# Patient Record
Sex: Male | Born: 1960 | Race: Black or African American | Hispanic: No | Smoking: Current every day smoker
Health system: Southern US, Community
[De-identification: ages and names within clinical notes are randomized; demographics above are authoritative.]

## PROBLEM LIST (undated history)

## (undated) DIAGNOSIS — N182 Chronic kidney disease, stage 2 (mild): Secondary | ICD-10-CM

## (undated) DIAGNOSIS — Z720 Tobacco use: Secondary | ICD-10-CM

## (undated) DIAGNOSIS — I2510 Atherosclerotic heart disease of native coronary artery without angina pectoris: Secondary | ICD-10-CM

## (undated) DIAGNOSIS — I739 Peripheral vascular disease, unspecified: Secondary | ICD-10-CM

## (undated) DIAGNOSIS — I639 Cerebral infarction, unspecified: Secondary | ICD-10-CM

## (undated) DIAGNOSIS — M722 Plantar fascial fibromatosis: Secondary | ICD-10-CM

## (undated) DIAGNOSIS — Z9289 Personal history of other medical treatment: Secondary | ICD-10-CM

## (undated) DIAGNOSIS — I10 Essential (primary) hypertension: Secondary | ICD-10-CM

## (undated) DIAGNOSIS — Z809 Family history of malignant neoplasm, unspecified: Secondary | ICD-10-CM

## (undated) DIAGNOSIS — C169 Malignant neoplasm of stomach, unspecified: Secondary | ICD-10-CM

## (undated) DIAGNOSIS — N289 Disorder of kidney and ureter, unspecified: Secondary | ICD-10-CM

## (undated) DIAGNOSIS — E785 Hyperlipidemia, unspecified: Secondary | ICD-10-CM

## (undated) HISTORY — DX: Chronic kidney disease, stage 2 (mild): N18.2

## (undated) HISTORY — PX: CARDIAC CATHETERIZATION: SHX172

## (undated) HISTORY — DX: Hyperlipidemia, unspecified: E78.5

## (undated) HISTORY — PX: FEMORAL ARTERY - POPLITEAL ARTERY BYPASS GRAFT: SUR180

## (undated) HISTORY — PX: PR VEIN BYPASS GRAFT,AORTO-FEM-POP: 35551

## (undated) HISTORY — DX: Atherosclerotic heart disease of native coronary artery without angina pectoris: I25.10

## (undated) HISTORY — DX: Tobacco use: Z72.0

## (undated) HISTORY — DX: Peripheral vascular disease, unspecified: I73.9

## (undated) HISTORY — DX: Family history of malignant neoplasm, unspecified: Z80.9

## (undated) HISTORY — DX: Malignant neoplasm of stomach, unspecified: C16.9

## (undated) HISTORY — DX: Cerebral infarction, unspecified: I63.9

## (undated) HISTORY — PX: OTHER SURGICAL HISTORY: SHX169

## (undated) HISTORY — PX: COLONOSCOPY: SHX174

---

## 2007-02-22 ENCOUNTER — Inpatient Hospital Stay (HOSPITAL_COMMUNITY): Admission: EM | Admit: 2007-02-22 | Discharge: 2007-02-26 | Payer: Self-pay | Admitting: Emergency Medicine

## 2007-02-27 ENCOUNTER — Ambulatory Visit (HOSPITAL_COMMUNITY): Admission: RE | Admit: 2007-02-27 | Discharge: 2007-02-27 | Payer: Self-pay | Admitting: Internal Medicine

## 2007-02-27 ENCOUNTER — Ambulatory Visit: Payer: Self-pay | Admitting: Cardiology

## 2007-07-17 ENCOUNTER — Emergency Department (HOSPITAL_COMMUNITY): Admission: EM | Admit: 2007-07-17 | Discharge: 2007-07-17 | Payer: Self-pay | Admitting: Emergency Medicine

## 2007-08-15 ENCOUNTER — Emergency Department (HOSPITAL_COMMUNITY): Admission: EM | Admit: 2007-08-15 | Discharge: 2007-08-15 | Payer: Self-pay | Admitting: Emergency Medicine

## 2007-09-26 ENCOUNTER — Emergency Department (HOSPITAL_COMMUNITY): Admission: EM | Admit: 2007-09-26 | Discharge: 2007-09-26 | Payer: Self-pay | Admitting: Emergency Medicine

## 2011-01-06 ENCOUNTER — Other Ambulatory Visit (HOSPITAL_COMMUNITY): Payer: Self-pay | Admitting: Urology

## 2011-01-11 ENCOUNTER — Encounter (HOSPITAL_COMMUNITY): Payer: Self-pay

## 2011-01-11 ENCOUNTER — Ambulatory Visit (HOSPITAL_COMMUNITY)
Admission: RE | Admit: 2011-01-11 | Discharge: 2011-01-11 | Disposition: A | Discharge status: Home / Self Care | Payer: PRIVATE HEALTH INSURANCE | Source: Ambulatory Visit | Attending: Urology | Admitting: Urology

## 2011-01-11 DIAGNOSIS — R319 Hematuria, unspecified: Secondary | ICD-10-CM | POA: Insufficient documentation

## 2011-01-11 DIAGNOSIS — R109 Unspecified abdominal pain: Secondary | ICD-10-CM | POA: Insufficient documentation

## 2011-01-11 DIAGNOSIS — I77811 Abdominal aortic ectasia: Secondary | ICD-10-CM | POA: Insufficient documentation

## 2011-01-11 HISTORY — DX: Essential (primary) hypertension: I10

## 2011-01-11 MED ORDER — IOHEXOL 300 MG/ML  SOLN
125.0000 mL | Freq: Once | INTRAMUSCULAR | Status: AC | PRN
  Administered 2011-01-11: 125 mL via INTRAVENOUS

## 2011-02-09 NOTE — Group Therapy Note (Signed)
Todd [name], Todd [name]                  ACCOUNT NO.:  [id number]   MEDICAL RECORD NO.:  [id number]          PATIENT TYPE:  INP   LOCATION:  A203                          FACILITY:  [facility]   PHYSICIAN:  Kofi A. Gerilyn Pilgrim, M.D. DATE OF BIRTH:  1961-04-29   DATE OF PROCEDURE:  DATE OF DISCHARGE:                                 PROGRESS NOTE   The family was very concerned today because the patient has had some  confusion, and Dr. Benson Setting wanted me to come by and examine the patient  again.  It appears that they were concerned that the patient may have a  neglect, but on further questioning, it appears that he seemed to be  having some right-to-left disorientation.  They also report that he has  very low spontaneous speech, which was observed yesterday.   PHYSICAL EXAMINATION:  GENERAL:  Examination today shows that he is  awake.  VITAL SIGNS:  Temperature 98.2, pulse 65, respiratory rate 20, blood  pressure 131/85.  NEUROLOGIC:  He is awake.  Again, he has excellent comprehension and  follows commands briskly, but he has positive spontaneous speech and  poor fluency.  The patient has full visual fields that are intact  without any neglect noted involving the visual stimulation.  No neglect  noted in the upper extremities.  He is able to name fingers, and there  is no evidence of finger agnosia.  He does, however, seem to have  difficulty with left-right disoriented relating to his feet,  consistently calling his right leg the left leg.  Additional impairment  seems to be evident with the patient having difficulty calculating,  especially using additions.  He actually did well with multiplication  and simple subtraction but consistently made errors with addition,  seemingly confusing it with multiplication.  There is some impairment in  drawing on the right, indicating some evidence of agraphia.  He  continues to have good strength of all of the upper and lower  extremities.   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  It appears that his stroke has probably  affected more of the posterior fibers than suspected.  We reassured this  family that the symptoms are due to his stroke and he should get better  with time, how much improvement is not known.  They have concerns about  the patient because he tends to wander off.  In fact, he wandered off  from the floor today, went to the store and bought cigarettes, and  returned to the floor.  He will need close supervision at home until he  recovers.      Kofi A. Gerilyn Pilgrim, M.D.  Electronically Signed     KAD/MEDQ  D:  02/24/2007  T:  02/24/2007  Job:  [id number]

## 2011-02-09 NOTE — Procedures (Signed)
[name], Todd [name]                  ACCOUNT NO.:  [id number]   MEDICAL RECORD NO.:  [id number]          PATIENT TYPE:  OUT   LOCATION:  RAD                           FACILITY:  [facility]   PHYSICIAN:  Gerrit Friends. Dietrich Pates, MD, FACCDATE OF BIRTH:  April 06, 1961   DATE OF PROCEDURE:  02/27/2007  DATE OF DISCHARGE:                                ECHOCARDIOGRAM   REFERRING:  Osvaldo Shipper, M.D.   CLINICAL DATA:  A 50 year old gentleman with CVA and hypertension.  Aorta 3.7, left atrium 4.3, septum 2.0, posterior wall 1.9, LV diastole  3.9, LV systole 2.7.   1. Technically suboptimal but adequate echocardiographic study.  2. Normal left atrium, right atrium and right ventricle.  3. Normal tricuspid valve; physiologic regurgitation.  4. Minimal sclerosis of a trileaflet aortic valve.  5. Normal diameter of the proximal ascending aorta; mild calcification      of the wall and annulus.  6. Mild mitral valve thickening with trivial regurgitation.  7. Normal pulmonic valve and proximal pulmonary artery.  8. Normal left ventricular size; moderate hypertrophy; normal regional      and global function.  9. Normal IVC.  10.No pericardial effusion.      Gerrit Friends. Dietrich Pates, MD, [facility]  Electronically Signed     RMR/MEDQ  D:  02/28/2007  T:  02/28/2007  Job:  [id number]

## 2011-02-09 NOTE — H&P (Signed)
Todd [name], SINGLETON                  ACCOUNT NO.:  [id number]   MEDICAL RECORD NO.:  [id number]          PATIENT TYPE:  INP   LOCATION:  A203                          FACILITY:  [facility]   PHYSICIAN:  Osvaldo Shipper, MD     DATE OF BIRTH:  1961-03-17   DATE OF ADMISSION:  02/22/2007  DATE OF DISCHARGE:  LH                              HISTORY & PHYSICAL   PRIMARY CARE PHYSICIAN:  The patient does not have a primary care  physician.   ADMISSION DIAGNOSES:  1. 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  2. Nonspecific white matter lesions of unclear etiology.  3. Untreated hypertension.  4. Illicit drug use.  5. Tobacco abuse.   CHIEF COMPLAINT:  Altered mental status and right-sided weakness for  about 2 weeks.   HISTORY OF PRESENT ILLNESS:  The patient is a 50 year old African-  American male who has a history of hypertension, however, he has not  been taking his medications for at least 2-3 weeks as far as we know.  The patient is having altered mental status, hence is not a good  historian.  His parents are with him in the room and they provide part  of the history.  Apparently about 2-3 weeks the patient started noticing  weakness on the right side.  He has been having some frequent falls.  He  has been having some numbness.  He has also had urinary incontinence  associated with this weakness.  The patient also reports hematuria.  He  is having some slurred speech.  He says that he has also been forgetting  words and has been having some memory issues as well.  This is the first  time these symptoms have been present.  He has never had a stroke in the  past.  The patient also complains of pain in the right side of his  abdomen along with hematuria.  The hematuria itself is painless.  The  patient reports a weight loss of 10 pounds over the past 2-3 weeks.  Because of the patient being a poor historian, unfortunately, other  history is not elicited at this time.  But  it appears that his main  issue is the memory impairment, right-sided weakness, and hematuria  along with the abdominal pain.   MEDICATIONS:  He is on unknown antihypertensive agents which he has not  been taking for a while.  He was also started on Cipro by [facility]  physicians for an apparent UTI.   ALLERGIES:  No known drug allergies.   PAST MEDICAL HISTORY:  Positive for hypertension, however, which is not  being treated as mentioned above.  He is noncompliant.  He denies any  other medical problems.  No surgeries in the past.  He also has  peripheral vascular disease.   SOCIAL HISTORY:  He lives in [place] with his parents.  He does not work.  He smokes two packs of cigarettes everyday.  He has a 40-pack-year  history of smoking, no alcohol use.  He smokes marijuana on a daily  basis for almost 10 years  now.  He denies any cocaine use or heroin use.   FAMILY HISTORY:  Positive for leukemia, CML in his mother, hypertension,  dyslipidemia, diabetes, coronary artery disease.  No history of strokes  or lupus in the family.   REVIEW OF SYSTEMS:  Unable to do in this gentleman with memory problems  and altered mental status.   PHYSICAL EXAMINATION:  VITAL SIGNS:  Temperature 97.6, blood pressure  143/88, heart rate is in the 80s and is regular, respiratory rate is 18,  saturation is 97% on room air.  GENERAL:  A well-developed, well-nourished, individual in no acute  distress.  HEENT:  There is no pallor and no icterus.  Oral mucous membrane is  moist.  No oral lesions are noted.  NECK:  Soft and supple.  No thyromegaly is appreciated.  Trachea is  posterior midline.  LUNGS:  Clear to auscultation bilaterally.  No dullness to percussion.  No wheezes, rales, or rhonchi.  CARDIOVASCULAR:  S1 and S2 is normal, regular.  No murmurs appreciated.  No S3 or S4.  No carotid bruits are heard.  ABDOMEN:  Soft, nontender, and nondistended.  Bowel sounds are present.  No mass or  organomegaly is present.  He does point to his right lower  quadrant, but there is no palpable tenderness.  GENITOURINARY:  His genitalia look normal.  MUSCULOSKELETAL:  No abnormality present.  Peripheral pulses palpable,  no edema is present.  NEUROLOGY:  The patient is alert and oriented to time, place, person,  but takes a while to answer questions.  Cranial nerves reveal normal  examination.  Sensory examination to soft touch was equal bilaterally.  Motor examination; slight deficits in the ankle extension on the right  side.  However, the rest of the groups were equal on both sides, 5/5.  Cerebellar signs; again a little deficit on the right lower extremity.  The upper extremities were equal.  Finger-to-nose was normal.  NO  pronator drift was noticed.  Gait was assessed in a limited fashion  which was normal.   LABORATORY DATA:  CBC showed a white count of 11.7, hemoglobin 14.3,  platelet count 284.  Differential is normal.  BMET showed a sodium of  134, otherwise unremarkable.  LFT's also are unremarkable.  He has not  had a UA here.  No EKG done here.  He did have a UA at the [facility]  facility which showed cloudy urine, brown color, more than 1.03 specific  gravity, 2+ blood, 1+ bilirubin, 1+ protein, negative leukocytes,  negative nitrites.   He did have imaging study in the form of a CAT scan with and without  contrast which showed ill-defined regions of low attenuation involving  the medial cortex and subcortical white matter of the left frontal lobe  with questionable enhancement, nonhemorrhagic infarct versus  infiltrative process was questioned.   He had an MRI here which showed a left ACA distribution infarct and  other white matter lesions were noted of questionable etiology, possible  vasculitis was questioned.   ASSESSMENT:  This is a 50 year old African-American male with history of hypertension who presents with 2-3 week history of right-sided weakness,   memory impairment, speech difficulties, and hematuria.  He has had a  stroke based on the MRI done today which would account for some of his  symptomatology.  His white matter lesions are of concern, however, which  could be secondary to his hypertension and also could be secondary to  his polysubstance abuse.  Autoimmune  vasculitic processes also need to  be considered.  Hematuria is also concerning in this gentleman who seems  to be having painless hematuria.  I think he needs further imaging  studies and a urology consultation.   PLAN:  1. Neurology.  We will admit the patient to telemetry, get EKG,      Doppler study, echo, and ESR, B12, RPR, TSH.  Consult Kofi A.      Gerilyn Pilgrim, M.D. to see this individual. Start him on aspirin for      now.  Blood pressure control will be crucial in this patient.  PT,      OT, and swallowing evaluation will be done as well.  EEG will also      be obtained.  2. Hematuria, painless.  We will check UA again here.  We will consult      Dennie Maizes, M.D. to evaluate this patient.  We will obtain CAT      scan of his abdomen and pelvis with and without contrast to      evaluate the hematuria to look at his genitourinary morphology as      well as to evaluate his right-sided lower abdominal pain.  3. Right-sided abdominal pain.  It is nontender, so I am not quite      sure what this is.  I could be related to the hematuria and as      discussed above, we will get a CAT scan.  4. Hypertension.  We will put him on metoprolol and then further      control his blood pressure aggressively.  5. DVT and GI prophylaxis will be initiated.   The above was discussed with the patient and his parents.  I told him  that the patient will likely need hospitalization for 1-2 days to  complete his workup and then he will need continued outpatient  evaluation and follow-up.   Further management decisions will be based on results of initial testing  and the patient's  response to treatment.      Osvaldo Shipper, MD  Electronically Signed     GK/MEDQ  D:  02/22/2007  T:  02/22/2007  Job:  [id number]   cc:   Darleen Crocker A. Gerilyn Pilgrim, M.D.  Fax: [phone]

## 2011-02-09 NOTE — Discharge Summary (Signed)
NAMEHIYAN, Todd [name]                  ACCOUNT NO.:  [id number]   MEDICAL RECORD NO.:  [id number]          PATIENT TYPE:  INP   LOCATION:  A203                          FACILITY:  [facility]   PHYSICIAN:  Skeet Latch, DO    DATE OF BIRTH:  1961-07-25   DATE OF ADMISSION:  02/22/2007  DATE OF DISCHARGE:  06/01/2008LH                               DISCHARGE SUMMARY   DISCHARGE DIAGNOSES:  1. Subacute infarct left anterior cerebral artery.  2. Dyslipidemia.  3. Hypertension.  4. Painless hematuria.   BRIEF HOSPITAL COURSE:  Mr. Morie is a 50 year old African-American male,  with history of hypertension that has not been treated, who presented  with altered mental status.  Apparently, approximately 2-3 weeks prior  the patient started noticing weakness on his right side and was having  frequent falls.  He was having also some numbness and some urinary  incontinence associated with this weakness.  The patient also stated he  was having some painless hematuria at the time and was having problems  with his memory.  The patient has no history of any strokes in the past  or any major medical problems.  Of note, patient also reported a weight  loss of approximately 10 pounds over the last few weeks.  On admission  the patient was a poor historian due to his memory impairment.  Patient  was admitted for right-sided weakness, hematuria, some memory  impairment.  The patient was admitted to the telemetry unit.  Doppler  studies performed showed some moderate disease.  Neurology was  consulted.   The patient had an MR of his brain done which showed:  1. Acute early subacute left ACA infarct with areas of involvement.  2. Showed premature atrophy and small vessel disease with potentially      vasculitis.  3. Showed old inferior blowout fracture.   His carotid Doppler showed mild bilateral carotid bifurcation plaque  without hemodynamically significant stenosis.  Patient complained of  some  abdominal pain, had a pelvic CT done that showed no renal calculi  or masses, showed a low density lesion right lobe of liver too small to  adequately characterize.  Neck showed atherosclerotic type changes in  the aorta with a 2.7 cm focal bulge lower abdominal aorta.  Pelvis CT  showed some enlargement of its prostate.  I had a limited evaluation of  his bladder without discrete mass.  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  Neurology, as stated, was  consulted due to patient's right-sided weakness and his subacute  infarct.  The patient was placed on aspirin, also a lipid-lowering  agent, and his blood pressure was controlled.  His EEG did show some  mild slowing per Neurology.  At this time the patient is stable for  discharge.   VITALS ON DISCHARGE:  Temperature is 97, pulse 66, respirations 18,  blood pressure is 150/102, O2 sat 98% on room air.   LABS:  White blood cell count 11.3, hemoglobin 13.5, hematocrit 38.3,  platelets 251.  Sodium 137, potassium 4.3, chloride 110,  CO2 24, glucose  88, BUN 15, creatinine 1.16.   CONDITION AT DISCHARGE:  Stable.  Patient will be discharged to home.   DIET:  Patient will have a low sodium, heart healthy diet.   ACTIVITY:  Slowly increase activity as directed.   FOLLOWUP:  Patient is to follow up with Neurology in approximately 2  weeks.  Also, we will give patient the name of a primary care physician  and he needs to follow up within the next 7-10 days also.   MEDICATIONS ON DISCHARGE:  1. Aspirin 81 mg daily.  2. Lipitor 40 mg one tab p.o. q.h.s.  3. Lopressor 50 mg p.o. b.i.d.   The patient was scheduled for an echo on February 27, 2007, and will give  patient information regarding keeping this appointment at this time.      Skeet Latch, DO  Electronically Signed     SM/MEDQ  D:  02/26/2007  T:  02/26/2007  Job:  [id number]   cc:   Darleen Crocker A. Gerilyn Pilgrim, M.D.  Fax: [phone]

## 2011-02-09 NOTE — Consult Note (Signed)
NAMEKWENTIN, SINAGRA                  ACCOUNT NO.:  [id number]   MEDICAL RECORD NO.:  [id number]          PATIENT TYPE:  INP   LOCATION:  A203                          FACILITY:  [facility]   PHYSICIAN:  Kofi A. Gerilyn Pilgrim, M.D. DATE OF BIRTH:  1960/11/26   DATE OF CONSULTATION:  DATE OF DISCHARGE:                                 CONSULTATION   HISTORY OF PRESENT ILLNESS:  This is a 50 year old man who presents with  a 2-week history of altered mental status.  It appears that the patient  may have had acute right-sided weakness 2 weeks ago which resulted in  the patient falling to the ground.  It appears that his symptoms  recovered, but he did not seek medical attention or inform his family  about this.  He apparently continued to have some spells of confusion  which got a lot worse yesterday which resulted in the patient being  taken to [facility] in the ambulance subsequently down here.  The  patient apparently has had some problems being child like and difficulty  with memory and focusing.  He apparently has a one-year history or so of  hypertension but recently has not been taking his medications.  He also  has had problems with urinary incontinence and hematuria which has been  a recent problem.  This is currently being worked up by a urologist  which has been consulted.   PAST MEDICAL HISTORY:  Hypertension, noncompliant with medical care.  No  other medical problems.   CURRENT MEDICATIONS:  1. Lovenox.  2. Metoprolol.  3. Nicotine patch.  4. Aspirin 325 mg.   SOCIAL HISTORY:  He stays with his parents and also his girlfriend.  He  smokes 2 packs of cigarettes per day and has done so for about 4 years.  No alcohol use.  He does smoke marijuana but uses no cocaine or illicit  drugs.   FAMILY HISTORY:  Significant for coronary artery disease, dyslipidemia,  leukemia, hypertension.   PHYSICAL EXAMINATION:  GENERAL:  A thin pleasant gentleman in no acute  distress.  VITAL SIGNS:   Temperature 97.5, pulse 64, respirations 20, blood  pressure 146/98.  HEENT:  Head is normocephalic and atraumatic.  Poor dentition.  NECK:  Supple.  ABDOMEN:  Soft.  EXTREMITIES:  No significant varicosities or edema.  MENTAL STATUS:  He does seem to have a mild bradyphrenia, but his naming  actually looks very good as he is able to name all objects tested.  Repetition was also relatively preserved as was comprehension.  He does  seem to have some element of reduced fluency, however.  No dysarthria is  noted.  CRANIAL NERVES:  Pupils are 4 mm and briskly reactive.  Extraocular  movements are intact.  Facial muscle strength is symmetric.  Tongue is  midline.  Uvula is midline.  Shoulder shrug is normal.  MOTOR:  Normal tone, bulk and strength.  I see no pronator drift.  REFLEXES:  Slightly brisk.  Plantar reflexes are both downgoing.  SENSORY:  Normal to light touch.  GAIT:  Normal.  COORDINATION:  No dysmetria, tremors or parkinsonism.   IMAGING:  MRI scan is reviewed and shows infarcts involving the left  anterior cerebral artery distribution affecting the corpus callosum,  singlet gyrus and adjacent white matter and cortical areas.  There are  couple of subcortical hyperintensities noted on fair imaging.  Carotid  Dopplers look fine.   LABORATORIES:  Vitamin B-12 421.  Lipid profile with total cholesterol  217, HDL 22, LDL 161, triglycerides 169.  Sed rate 20.  RPR nonreactive.  Urine drug screen negative.  Urinalysis revealed large blood.  Hepatic  function normal.  Basic metabolic profile with sodium 134, potassium  3.7, chloride 103, glucose 89, BUN 19, creatinine 1.2.  WBC 11,  hemoglobin 14, platelet count 284.   ASSESSMENT:  1. Subacute infarct involving the left anterior cerebral artery with      confusion, difficulty focusing and subtle language impairment.  2. Significant dyslipidemia.  3. Hypertension.   RECOMMENDATIONS:  1. Agree with the current workup and  treatment, including aspirin.  He      should be on a lipid-lowering agent, preferably a statin.  2. Blood pressure control and close followup with a PCP.  3. He also had an EEG ordered which I think is appropriate given the      persistent confusion.   Thank you for this consultation.      Kofi A. Gerilyn Pilgrim, M.D.  Electronically Signed     KAD/MEDQ  D:  02/23/2007  T:  02/23/2007  Job:  [id number]   cc:   Katheren Shams. Melvyn Neth, MD  Fax: [phone]

## 2011-02-09 NOTE — Procedures (Signed)
NAMEAUSTEN, Todd [name]                  ACCOUNT NO.:  [id number]   MEDICAL RECORD NO.:  [id number]          PATIENT TYPE:  INP   LOCATION:  A203                          FACILITY:  [facility]   PHYSICIAN:  Kofi A. Gerilyn Pilgrim, M.D. DATE OF BIRTH:  1961/05/19   DATE OF PROCEDURE:  02/25/2007  DATE OF DISCHARGE:  02/26/2007                              EEG INTERPRETATION   EEG REPORT:  The patient is a 50 year old man who presents with altered  mental status confusion with a possibility of complex partial seizures  and hence the EEG.   MEDICATIONS:  1. Aspirin.  2. Lovenox.  3. Lopressor.  4. Nicotine.  5. Cipro.  6. Lipitor.  7. Phenergan.  8. Zofran.  9. Ventolin.  10.Tylenol.   ANALYSIS:  A 16-channel recording was conducted for 21 minutes.  There  is a posterior rhythm of 12-13 hertz with low electrocortical voltage  with background activity attenuating to eye opening.  Photic stimulation  and hypoventilation are conducted without significant changes in the  background activity.  Left frontal slowing in the F3-F7 region is noted.  There is no epileptiform activity or lateralized slowing noted.   IMPRESSION:  Abnormal recording due to left frontal slowing which can be  seen in focal cerebral disturbances.  However, there is no evidence of  epileptiform activity.      Kofi A. Gerilyn Pilgrim, M.D.  Electronically Signed     KAD/MEDQ  D:  02/28/2007  T:  02/28/2007  Job:  [id number]

## 2011-07-07 LAB — BASIC METABOLIC PANEL
BUN: 31 — ABNORMAL HIGH
Chloride: 102
GFR calc non Af Amer: 41 — ABNORMAL LOW

## 2011-07-07 LAB — CBC
MCHC: 34.9
MCV: 85.8
RBC: 4.47
WBC: 11.4 — ABNORMAL HIGH

## 2011-07-07 LAB — DIFFERENTIAL
Basophils Absolute: 0.1
Basophils Relative: 1
Eosinophils Relative: 1
Monocytes Absolute: 0.8 — ABNORMAL HIGH
Neutro Abs: 6

## 2011-07-15 LAB — BASIC METABOLIC PANEL
Chloride: 110
GFR calc Af Amer: >60
GFR calc non Af Amer: >60
Glucose, Bld: 88
Potassium: 4.3

## 2011-07-15 LAB — URINALYSIS, ROUTINE W REFLEX MICROSCOPIC
Ketones, ur: NEGATIVE
Leukocytes, UA: NEGATIVE
Nitrite: NEGATIVE
Protein, ur: NEGATIVE

## 2011-07-15 LAB — DIFFERENTIAL
Monocytes Relative: 6
Neutro Abs: 6.2
Neutrophils Relative %: 55

## 2011-07-15 LAB — CBC
MCHC: 35.3
MCV: 84.6
RDW: 14

## 2012-04-11 ENCOUNTER — Encounter: Payer: Self-pay | Admitting: Vascular Surgery

## 2012-04-14 ENCOUNTER — Emergency Department (HOSPITAL_COMMUNITY)
Admission: EM | Admit: 2012-04-14 | Discharge: 2012-04-14 | Disposition: A | Discharge status: Home / Self Care | Payer: PRIVATE HEALTH INSURANCE | Attending: Emergency Medicine | Admitting: Emergency Medicine

## 2012-04-14 ENCOUNTER — Emergency Department (HOSPITAL_COMMUNITY): Payer: PRIVATE HEALTH INSURANCE

## 2012-04-14 ENCOUNTER — Encounter (HOSPITAL_COMMUNITY): Payer: Self-pay | Admitting: *Deleted

## 2012-04-14 DIAGNOSIS — Z8673 Personal history of transient ischemic attack (TIA), and cerebral infarction without residual deficits: Secondary | ICD-10-CM | POA: Insufficient documentation

## 2012-04-14 DIAGNOSIS — M7700 Medial epicondylitis, unspecified elbow: Secondary | ICD-10-CM | POA: Insufficient documentation

## 2012-04-14 DIAGNOSIS — E785 Hyperlipidemia, unspecified: Secondary | ICD-10-CM | POA: Insufficient documentation

## 2012-04-14 DIAGNOSIS — F17200 Nicotine dependence, unspecified, uncomplicated: Secondary | ICD-10-CM | POA: Insufficient documentation

## 2012-04-14 DIAGNOSIS — I129 Hypertensive chronic kidney disease with stage 1 through stage 4 chronic kidney disease, or unspecified chronic kidney disease: Secondary | ICD-10-CM | POA: Insufficient documentation

## 2012-04-14 DIAGNOSIS — N189 Chronic kidney disease, unspecified: Secondary | ICD-10-CM | POA: Insufficient documentation

## 2012-04-14 MED ORDER — NAPROXEN 500 MG PO TABS: 500.0000 mg | ORAL_TABLET | Freq: Two times a day (BID) | ORAL | Status: DC

## 2012-04-14 NOTE — ED Notes (Signed)
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

## 2012-04-14 NOTE — ED Provider Notes (Signed)
History     CSN: [id number]  Arrival date & time 04/14/12  1323   First MD Initiated Contact with Patient 04/14/12 1402      Chief Complaint  Patient presents with  . Arm Pain    (Consider location/radiation/quality/duration/timing/severity/associated sxs/prior treatment) HPI Comments: Todd [name] presents with a one-month history of pain in his left medial elbow with numbness of his left forearm in the ulnar distribution which is also been present for the past month.  He denies weakness in his left arm or hand but has constant pain in he notes that he is unable to completely extend his elbow without significant pain.  He denies any injuries or overuse syndromes.  He is right-handed.  Pain is achy and constant.  He has taken Tylenol without relief of symptoms.  The history is provided by the patient.    Past Medical History  Diagnosis Date  . Hypertension   . Hyperlipidemia   . Peripheral vascular disease   . Stroke     2008  . Chronic kidney disease     moderate    Past Surgical History  Procedure Date  . Stents in bil legs     Family History  Problem Relation Age of Onset  . Diabetes Mother   . Heart disease Mother   . Stroke Mother     x 2  . Cancer Father     colon  . Arthritis Father     History  Substance Use Topics  . Smoking status: Current Everyday Smoker    Types: Cigarettes    Last Attempt to Quit: 04/11/2010  . Smokeless tobacco: Not on file  . Alcohol Use: No      Review of Systems  Musculoskeletal: Positive for arthralgias. Negative for joint swelling.  Skin: Negative for wound.  Neurological: Positive for numbness. Negative for weakness.    Allergies  Review of patient's allergies indicates no known allergies.  Home Medications   Current Outpatient Rx  Name Route Sig Dispense Refill  . ASPIRIN 81 MG PO TABS Oral Take 81 mg by mouth daily.    . CHLORTHALIDONE 25 MG PO TABS Oral Take 25 mg by mouth daily.    Marland Kitchen ESZOPICLONE 3 MG  PO TABS Oral Take 3 mg by mouth at bedtime. Take immediately before bedtime    . METOPROLOL TARTRATE 50 MG PO TABS Oral Take 50 mg by mouth 2 (two) times daily.    Marland Kitchen ROSUVASTATIN CALCIUM 10 MG PO TABS Oral Take 10 mg by mouth daily.    Marland Kitchen SILDENAFIL CITRATE 50 MG PO TABS Oral Take 50 mg by mouth daily as needed.    . TRIAMTERENE-HCTZ 37.5-25 MG PO CAPS Oral Take 1 capsule by mouth daily as needed. Only when pressure is high.    Marland Kitchen NAPROXEN 500 MG PO TABS Oral Take 1 tablet (500 mg total) by mouth 2 (two) times daily. 30 tablet 0    BP 104/69  Pulse 70  Temp 97.9 F (36.6 C) (Oral)  Resp 20  Ht 6\' 1"  (1.854 m)  Wt 201 lb (91.173 kg)  BMI 26.52 kg/m2  SpO2 99%  Physical Exam  Constitutional: He appears well-developed and well-nourished.  HENT:  Head: Atraumatic.  Neck: Normal range of motion.  Cardiovascular:       Pulses equal bilaterally  Musculoskeletal: He exhibits tenderness. He exhibits no edema.       Left elbow: He exhibits decreased range of motion. He exhibits no swelling, no  effusion and no deformity. tenderness found. Medial epicondyle tenderness noted.       Patient can extend left elbow to approximately 150, he resists full extension.  He can supinate and pronate the forearm without difficulty.    Neurological: He is alert. He has normal strength. He displays normal reflexes. No sensory deficit.       Equal strength  Skin: Skin is warm and dry.  Psychiatric: He has a normal mood and affect.    ED Course  Procedures (including critical care time)  Labs Reviewed - No data to display Dg Elbow Complete Left  04/14/2012  *RADIOLOGY REPORT*  Clinical Data: Left elbow pain.  LEFT ELBOW - COMPLETE 3+ VIEW  Comparison: No priors.  Findings: On the lateral projection there is a large elbow joint effusion.  No acute fracture, subluxation or dislocation.  IMPRESSION: 1.  Large left elbow joint effusion. 2.  No acute bony abnormality.  Original Report Authenticated By: Florencia Reasons, M.D.     1. Epicondylitis elbow, medial       MDM  Pt prescribed naproxen,  Ace wrap supplied.  Xrays reviewed prior to dc home - of note,  Pt WAS able to fully extend left elbow for xrays!   Referral to Dr. Romeo Apple for recheck if not improved over the next week.        Burgess Amor, [place] 04/14/12 2313

## 2012-04-14 NOTE — ED Notes (Signed)
Lt arm pain over 1 month,, tender to touch with some numbness present.No injury.

## 2012-04-18 NOTE — ED Provider Notes (Signed)
Medical screening examination/treatment/procedure(s) were performed by non-physician practitioner and as supervising physician I was immediately available for consultation/collaboration.   Shelda Jakes, MD 04/18/12 2204

## 2012-04-19 ENCOUNTER — Encounter: Payer: PRIVATE HEALTH INSURANCE | Admitting: Vascular Surgery

## 2012-04-19 ENCOUNTER — Encounter: Payer: Self-pay | Admitting: Vascular Surgery

## 2012-04-20 ENCOUNTER — Other Ambulatory Visit: Payer: Self-pay

## 2012-04-20 ENCOUNTER — Encounter: Payer: Self-pay | Admitting: Vascular Surgery

## 2012-04-20 ENCOUNTER — Ambulatory Visit (INDEPENDENT_AMBULATORY_CARE_PROVIDER_SITE_OTHER): Payer: PRIVATE HEALTH INSURANCE | Admitting: Vascular Surgery

## 2012-04-20 VITALS — BP 124/82 | HR 69 | Temp 98.7°F | Ht 73.0 in | Wt 206.0 lb

## 2012-04-20 DIAGNOSIS — I70219 Atherosclerosis of native arteries of extremities with intermittent claudication, unspecified extremity: Secondary | ICD-10-CM
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
## 2012-04-21 ENCOUNTER — Encounter (HOSPITAL_COMMUNITY): Payer: Self-pay | Admitting: Pharmacy Technician

## 2012-04-26 MED ORDER — SODIUM CHLORIDE 0.9 % IV SOLN: INTRAVENOUS | Status: DC

## 2012-04-27 ENCOUNTER — Other Ambulatory Visit: Payer: Self-pay | Admitting: *Deleted

## 2012-04-27 ENCOUNTER — Telehealth: Payer: Self-pay | Admitting: Vascular Surgery

## 2012-04-27 ENCOUNTER — Ambulatory Visit (HOSPITAL_COMMUNITY)
Admission: RE | Admit: 2012-04-27 | Discharge: 2012-04-27 | Disposition: A | Discharge status: Home / Self Care | Payer: PRIVATE HEALTH INSURANCE | Source: Ambulatory Visit | Attending: Vascular Surgery | Admitting: Vascular Surgery

## 2012-04-27 ENCOUNTER — Encounter (HOSPITAL_COMMUNITY)
Admission: RE | Disposition: A | Discharge status: Home / Self Care | Payer: Self-pay | Source: Ambulatory Visit | Attending: Vascular Surgery

## 2012-04-27 DIAGNOSIS — I70219 Atherosclerosis of native arteries of extremities with intermittent claudication, unspecified extremity: Secondary | ICD-10-CM

## 2012-04-27 DIAGNOSIS — I129 Hypertensive chronic kidney disease with stage 1 through stage 4 chronic kidney disease, or unspecified chronic kidney disease: Secondary | ICD-10-CM | POA: Insufficient documentation

## 2012-04-27 DIAGNOSIS — I739 Peripheral vascular disease, unspecified: Secondary | ICD-10-CM

## 2012-04-27 DIAGNOSIS — Z8673 Personal history of transient ischemic attack (TIA), and cerebral infarction without residual deficits: Secondary | ICD-10-CM | POA: Insufficient documentation

## 2012-04-27 DIAGNOSIS — Z01810 Encounter for preprocedural cardiovascular examination: Secondary | ICD-10-CM

## 2012-04-27 DIAGNOSIS — N183 Chronic kidney disease, stage 3 unspecified: Secondary | ICD-10-CM | POA: Insufficient documentation

## 2012-04-27 HISTORY — PX: ABDOMINAL AORTAGRAM: SHX5454

## 2012-04-27 LAB — POCT I-STAT, CHEM 8
Calcium, Ion: 1.05 mmol/L — ABNORMAL LOW (ref 1.12–1.23)
Chloride: 104 mEq/L (ref 96–112)
Glucose, Bld: 83 mg/dL (ref 70–99)
HCT: 42 % (ref 39.0–52.0)
Hemoglobin: 14.3 g/dL (ref 13.0–17.0)
Potassium: 5 mEq/L (ref 3.5–5.1)

## 2012-04-27 SURGERY — ABDOMINAL AORTAGRAM
Anesthesia: LOCAL

## 2012-04-27 MED ORDER — LIDOCAINE HCL (PF) 1 % IJ SOLN
INTRAMUSCULAR | Status: AC
  Filled 2012-04-27: qty 30

## 2012-04-27 MED ORDER — ACETAMINOPHEN 325 MG PO TABS: 650.0000 mg | ORAL_TABLET | ORAL | Status: DC | PRN

## 2012-04-27 MED ORDER — SODIUM CHLORIDE 0.9 % IV SOLN: 1.0000 mL/kg/h | INTRAVENOUS | Status: DC

## 2012-04-27 MED ORDER — HEPARIN (PORCINE) IN NACL 2-0.9 UNIT/ML-% IJ SOLN
INTRAMUSCULAR | Status: AC
  Filled 2012-04-27: qty 1000

## 2012-04-27 MED ORDER — ONDANSETRON HCL 4 MG/2ML IJ SOLN: 4.0000 mg | Freq: Four times a day (QID) | INTRAMUSCULAR | Status: DC | PRN

## 2012-04-27 MED ORDER — OXYCODONE-ACETAMINOPHEN 5-325 MG PO TABS: 1.0000 | ORAL_TABLET | ORAL | Status: DC | PRN

## 2012-04-27 MED ORDER — MIDAZOLAM HCL 2 MG/2ML IJ SOLN
INTRAMUSCULAR | Status: AC
  Filled 2012-04-27: qty 2

## 2012-04-27 MED ORDER — FENTANYL CITRATE 0.05 MG/ML IJ SOLN
INTRAMUSCULAR | Status: AC
  Filled 2012-04-27: qty 2

## 2012-04-27 MED ORDER — MORPHINE SULFATE 4 MG/ML IJ SOLN: 2.0000 mg | INTRAMUSCULAR | Status: DC | PRN

## 2012-04-27 NOTE — Op Note (Signed)
OPERATIVE NOTE   PROCEDURE: 1.  Right common femoral artery cannulation under ultrasound guidance 2.  Aortogram 3.  Bilateral leg runoff  PRE-OPERATIVE DIAGNOSIS: Bilateral leg intermittent claudication  POST-OPERATIVE DIAGNOSIS: same as above   SURGEON: Leonides Sake, MD  ANESTHESIA: conscious sedation  ESTIMATED BLOOD LOSS: 30 cc  CONTRAST: 95 cc  FINDING(S):  Aorta:  Patent  Superior mesenteric artery: not visualized  Celiac artery: Patent  Right Left  RA Patent Patent  CIA Patent Patent, linear shelf <30% stenosis  EIA Patent Patent  IIA Patent with 50% stenosis Patent  CFA Patent Patent  SFA Occluded stents from SFA takeoff down to above-the-knee popliteal artery Occluded stents from SFA takeoff down to Hunter's canal  PFA Patent Patent  Pop Reconstitutes from profunda collaterals at the knee level Reconstitutes from profunda collaterals at the above-the-knee level  Trif Patent Patent  AT Patent, dominant runoff Patent, dominant runoff  Pero Patent Patent  PT Occludes shortly after takeoff Occludes shortly after takeoff   SPECIMEN(S):  none  INDICATIONS:   Todd [name] is a 51 y.o. male who presents with bilateral leg intermittent claudication.  The patient presents for: aortogram and bilateral leg runoof.  I discussed with the patient the nature of angiographic procedures, especially the limited patencies of any endovascular intervention.  The patient is aware of that the risks of an angiographic procedure include but are not limited to: bleeding, infection, access site complications, renal failure, embolization, rupture of vessel, dissection, possible need for emergent surgical intervention, possible need for surgical procedures to treat the patient's pathology, and stroke and death.  The patient is aware of the risks and agrees to proceed.  DESCRIPTION: After full informed consent was obtained from the patient, the patient was brought back to the angiography  suite.  The patient was placed supine upon the angiography table and connected to monitoring equipment.  The patient was then given conscious sedation, the amounts of which are documented in the patient's chart.  The patient was prepped and drape in the standard fashion for an angiographic procedure.  At this point, attention was turned to the right groin.  Under ultrasound guidance, the right common femoral artery will be cannulated with a 18 gauge needle.  The [facility] wire was passed up into the aorta.  The needle was exchanged for a 5-Fr sheath, which was advanced over the wire into the common femoral artery.  The dilator was then removed.The Omniflush catheter was then loaded over the wire up to the level of L1.  The catheter was connected to the power injector circuit.  After de-airring and de-clotting the circuit, a power injector aortogram was completed.  The findings are as listed above.  The catheter was pulled to just proximal to the aortic bifurcation.  An automated bilateral leg runoff was completed after de-airring and de-clotting the circuit.  The findings are as listed above.  The sheath was aspirated.  No clots were present and the sheath was reloaded with heparinized saline.  Based on the images, this patient needs: Bilateral femoropopliteal bypasses.  COMPLICATIONS: none  CONDITION: stable   Leonides Sake, MD [facility] [place] Office: [phone] Pager: [phone]  04/27/2012, 11:49 AM

## 2012-04-27 NOTE — Telephone Encounter (Signed)
Message copied by Fredrich Birks on Thu Apr 27, 2012  3:18 PM ------      Message from: Melene Plan      Created: Thu Apr 27, 2012  2:39 PM                   ----- Message -----         From: Fransisco Hertz, MD         Sent: 04/27/2012  11:54 AM           To: Reuel Derby, Melene Plan, RN            ADAM SANJUAN      [id number]      Dec 15, 1960            PROCEDURE:      1.  Right common femoral artery cannulation under ultrasound guidance      2.  Aortogram      3.  Bilateral leg runoff            Follow-up: 4 weeks with Dr. Domenic Polite) for follow-up: BLE GSV mapping
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
## 2012-04-27 NOTE — Telephone Encounter (Signed)
Spoke with patient to schedule, dpm °

## 2012-04-27 NOTE — Interval H&P Note (Signed)
[facility]  History and Physical Update  The patient was interviewed and re-examined.  The patient's previous History and Physical has been reviewed and is unchanged.  There is no change in the plan of care.  Leonides Sake, MD [facility] [place] Office: [phone] Pager: [phone]  04/27/2012, 10:58 AM

## 2012-06-01 ENCOUNTER — Ambulatory Visit: Payer: PRIVATE HEALTH INSURANCE | Admitting: Vascular Surgery

## 2012-06-21 ENCOUNTER — Telehealth: Payer: Self-pay | Admitting: Vascular Surgery

## 2012-06-21 ENCOUNTER — Other Ambulatory Visit: Payer: Self-pay

## 2012-06-21 DIAGNOSIS — I739 Peripheral vascular disease, unspecified: Secondary | ICD-10-CM

## 2012-06-21 DIAGNOSIS — Z01818 Encounter for other preprocedural examination: Secondary | ICD-10-CM

## 2012-06-21 NOTE — Telephone Encounter (Signed)
Message copied by Margaretmary Eddy on Wed Jun 21, 2012  1:49 PM ------      Message from: Phillips Odor      Created: Wed Jun 21, 2012  1:29 PM      Regarding: needs appt. rescheduled      Contact: [phone]       Pt. previously cancelled a "4 week f/u" on 06/01/12 with Dr. Darrick Penna.  He wants to reschedule.  He will need to have bilateral lower extremity vein mapping, in addition to appt w/ [facility].

## 2012-06-28 ENCOUNTER — Encounter: Payer: Self-pay | Admitting: Vascular Surgery

## 2012-06-29 ENCOUNTER — Ambulatory Visit (INDEPENDENT_AMBULATORY_CARE_PROVIDER_SITE_OTHER): Payer: PRIVATE HEALTH INSURANCE | Admitting: Vascular Surgery

## 2012-06-29 ENCOUNTER — Encounter: Payer: Self-pay | Admitting: Vascular Surgery

## 2012-06-29 ENCOUNTER — Encounter (INDEPENDENT_AMBULATORY_CARE_PROVIDER_SITE_OTHER): Payer: PRIVATE HEALTH INSURANCE | Admitting: *Deleted

## 2012-06-29 VITALS — BP 124/85 | HR 80 | Resp 20 | Ht 73.0 in | Wt 186.0 lb

## 2012-06-29 DIAGNOSIS — M79609 Pain in unspecified limb: Secondary | ICD-10-CM

## 2012-06-29 DIAGNOSIS — I739 Peripheral vascular disease, unspecified: Secondary | ICD-10-CM

## 2012-06-29 DIAGNOSIS — Z01818 Encounter for other preprocedural examination: Secondary | ICD-10-CM

## 2012-06-30 NOTE — Progress Notes (Signed)
Pt returns for follow-up today after recent arteriogram.  This showed bilat SFA occlusions with bilat SFA stent occlusions.  He anatomically would be a candidate for a left fem above knee or below knee pop and a right fem below knee pop.  He has 2 vessel runoff via AT and peroneal bilat.  I reviewed his angiogram in detail today.  He still has short distance claudication left greater than right.  He has no rest pain.  He has no ulcers.  He is smoking 1/2 ppd.  He was counseled against this for > 3 min.  He has cut back  ROS: He denies chest pain.  He denies dyspnea  PE:  Filed Vitals:   06/29/12 1523  BP: 124/85  Pulse: 80  Resp: 20  Height: 6\' 1"  (1.854 m)  Weight: 186 lb (84.369 kg)   Chest clear to auscultation Cardiac Regular rate and rhythm Extremities 2+ femoral pulses, absent popliteal and pedal pulses Skin no ulcer or rash  DATA: Vein map reviewed and interpreted.  > 3mm vein to mid calf right, > 3mm to knee on left  A: Short distance disabling claudication L>R Plan: Cardiac risk stratification  Stop smoking  Left fem pop post cardiac eval  Fabienne Bruns, MD [facility] [place] Office: [phone] Pager: [phone]   Plan

## 2012-07-19 ENCOUNTER — Ambulatory Visit (INDEPENDENT_AMBULATORY_CARE_PROVIDER_SITE_OTHER): Payer: PRIVATE HEALTH INSURANCE | Admitting: Cardiovascular Disease

## 2012-07-19 ENCOUNTER — Encounter: Payer: Self-pay | Admitting: Cardiovascular Disease

## 2012-07-19 VITALS — BP 122/86 | HR 73 | Ht 73.0 in | Wt 190.8 lb

## 2012-07-19 DIAGNOSIS — E785 Hyperlipidemia, unspecified: Secondary | ICD-10-CM

## 2012-07-19 DIAGNOSIS — Z01818 Encounter for other preprocedural examination: Secondary | ICD-10-CM

## 2012-07-19 DIAGNOSIS — I739 Peripheral vascular disease, unspecified: Secondary | ICD-10-CM

## 2012-07-19 DIAGNOSIS — I10 Essential (primary) hypertension: Secondary | ICD-10-CM

## 2012-07-19 NOTE — Progress Notes (Signed)
Eusebio Friendly Date of Birth  1961/01/13       [facility]    [facility] [address]  [address] [place], [place]  [address]   [place], [place]  [address] [phone]     [phone]   Fax  [phone]    Fax [phone]  Problem List: 1. Peripheral vascular disease-significant lower extremity PAD - status post bilateral lower extremity stenting in the past. 2. Hypertension 3. Hyperlipidemia 4. CVA-   History of Present Illness:  Todd [name] is a 51 year old gentleman with a history of peripheral vascular disease. He has a history of hypertension and hyperlipidemia. He also has had a stroke. He was recently found to have significant peripheral vascular disease.  He has developed significant claudication in his legs-left much greater than the right.   He has occasional episodes of vague chest ache. He did not originally complain of any chest pain but only answered in the affirmative when I asked him. I'm not sure that these chest pains are all that  significant.  It is not worsened with exertion. It occurs at various times.   Not pleuretic.    Current Outpatient Prescriptions on File Prior to Visit  Medication Sig Dispense Refill  . aspirin 81 MG chewable tablet Chew 81 mg by mouth every morning.      . hydrochlorothiazide (HYDRODIURIL) 25 MG tablet Take 25 mg by mouth daily.      . rosuvastatin (CRESTOR) 10 MG tablet Take 10 mg by mouth at bedtime.       . sildenafil (VIAGRA) 50 MG tablet Take 50 mg by mouth daily as needed. Erectile dysfunction      . Tamsulosin HCl (FLOMAX) 0.4 MG CAPS Take 0.4 mg by mouth at bedtime.         No Known Allergies  Past Medical History  Diagnosis Date  . Hypertension   . Hyperlipidemia   . Peripheral vascular disease   . Stroke     2008  . Chronic kidney disease     moderate    Past Surgical History  Procedure Date  . Stents in bil legs     History  Smoking status  . Current Every Day  Smoker -- 0.8 packs/day for 25 years  . Types: Cigarettes  Smokeless tobacco  . Never Used    History  Alcohol Use No    Family History  Problem Relation Age of Onset  . Diabetes Mother   . Heart disease Mother     before age 49  . Stroke Mother     x 2  . Cancer Mother   . Deep vein thrombosis Mother   . Hyperlipidemia Mother   . Hypertension Mother   . Heart attack Mother   . Peripheral vascular disease Mother   . Cancer Father     colon  . Arthritis Father     Reviw of Systems:  Reviewed in the HPI.  All other systems are negative.  Physical Exam: Blood pressure 122/86, pulse 73, height 6\' 1"  (1.854 m), weight 190 lb 12.8 oz (86.546 kg), SpO2 98.00%. General: Well developed, well nourished, in no acute distress.  Head: Normocephalic, atraumatic, sclera non-icteric, mucus membranes are moist,   Neck: Supple. Carotids are 2 + without bruits. No JVD  Lungs: Clear bilaterally to auscultation.  Heart: regular rate.  normal  S1 S2. No murmurs, gallops or rubs.  Abdomen: Soft, non-tender, non-distended with normal bowel sounds. No  hepatomegaly. No rebound/guarding. No masses.  Msk:  Strength and tone are normal  Extremities: No clubbing or cyanosis. No edema.  Distal pedal pulses are trace    Neuro: Alert and oriented X 3. Moves all extremities spontaneously.  Psych:  Responds to questions appropriately with a normal affect.  ECG: Oct. 23, 2013 - NSR at 42.  Voltage for LVH. NS T wave abnormality.  Assessment / Plan:

## 2012-07-19 NOTE — Assessment & Plan Note (Signed)
Todd [name] presents today for preoperative evaluation prior to peripheral vascular surgery. He has a history of hypertension, hyperlipidemia, and has had a stroke.  He states that he has some chest pains but this was only in response to my asking the question. He did not originally complain of any episodes of chest pain. Nevertheless, he is at significant risk for having coronary artery disease with his underlying medical problems. We'll schedule him for a [employer] study for further evaluation.  If the Myoview study is normal then I will see him on an as-needed basis. If the Myoview study is abnormal then we will clearly need to perform a cardiac catheterization. I've asked him to stop smoking.

## 2012-07-19 NOTE — Patient Instructions (Addendum)
Your physician has requested that you have a lexiscan myoview.  Please follow instruction sheet, as given.  Your physician recommends that you continue on your current medications as directed. Please refer to the Current Medication list given to you today.  Your physician recommends that you schedule a follow-up appointment in: as needed basis.     You Can Quit Smoking If you are ready to quit smoking or are thinking about it, congratulations! You have chosen to help yourself be healthier and live longer! There are lots of different ways to quit smoking. Nicotine gum, nicotine patches, a nicotine inhaler, or nicotine nasal spray can help with physical craving. Hypnosis, support groups, and medicines help break the habit of smoking. TIPS TO GET OFF AND STAY OFF CIGARETTES  Learn to predict your moods. Do not let a bad situation be your excuse to have a cigarette. Some situations in your life might tempt you to have a cigarette.  Ask friends and co-workers not to smoke around you.  Make your home smoke-free.  Never have "just one" cigarette. It leads to wanting another and another. Remind yourself of your decision to quit.  On a card, make a list of your reasons for not smoking. Read it at least the same number of times a day as you have a cigarette. Tell yourself everyday, "I do not want to smoke. I choose not to smoke."  Ask someone at home or work to help you with your plan to quit smoking.  Have something planned after you eat or have a cup of coffee. Take a walk or get other exercise to perk you up. This will help to keep you from overeating.  Try a relaxation exercise to calm you down and decrease your stress. Remember, you may be tense and nervous the first two weeks after you quit. This will pass.  Find new activities to keep your hands busy. Play with a pen, coin, or rubber band. Doodle or draw things on paper.  Brush your teeth right after eating. This will help cut down the  craving for the taste of tobacco after meals. You can try mouthwash too.  Try gum, breath mints, or diet candy to keep something in your mouth. IF YOU SMOKE AND WANT TO QUIT:  Do not stock up on cigarettes. Never buy a carton. Wait until one pack is finished before you buy another.  Never carry cigarettes with you at work or at home.  Keep cigarettes as far away from you as possible. Leave them with someone else.  Never carry matches or a lighter with you.  Ask yourself, "Do I need this cigarette or is this just a reflex?"  Bet with someone that you can quit. Put cigarette money in a piggy bank every morning. If you smoke, you give up the money. If you do not smoke, by the end of the week, you keep the money.  Keep trying. It takes 21 days to change a habit!  Talk to your doctor about using medicines to help you quit. These include nicotine replacement gum, lozenges, or skin patches. Document Released: 07/10/2009 Document Revised: 12/06/2011 Document Reviewed: 07/10/2009 [facility] Patient Information 2013 [place], [place].

## 2012-07-27 ENCOUNTER — Ambulatory Visit (HOSPITAL_COMMUNITY): Payer: PRIVATE HEALTH INSURANCE | Attending: Cardiology | Admitting: Radiology

## 2012-07-27 VITALS — BP 109/79 | Ht 73.0 in | Wt 189.0 lb

## 2012-07-27 DIAGNOSIS — R9439 Abnormal result of other cardiovascular function study: Secondary | ICD-10-CM

## 2012-07-27 DIAGNOSIS — E785 Hyperlipidemia, unspecified: Secondary | ICD-10-CM | POA: Insufficient documentation

## 2012-07-27 DIAGNOSIS — Z01818 Encounter for other preprocedural examination: Secondary | ICD-10-CM

## 2012-07-27 DIAGNOSIS — I70219 Atherosclerosis of native arteries of extremities with intermittent claudication, unspecified extremity: Secondary | ICD-10-CM | POA: Insufficient documentation

## 2012-07-27 DIAGNOSIS — I739 Peripheral vascular disease, unspecified: Secondary | ICD-10-CM

## 2012-07-27 DIAGNOSIS — R9431 Abnormal electrocardiogram [ECG] [EKG]: Secondary | ICD-10-CM

## 2012-07-27 DIAGNOSIS — I10 Essential (primary) hypertension: Secondary | ICD-10-CM | POA: Insufficient documentation

## 2012-07-27 DIAGNOSIS — R079 Chest pain, unspecified: Secondary | ICD-10-CM

## 2012-07-27 MED ORDER — TECHNETIUM TC 99M SESTAMIBI GENERIC - CARDIOLITE
11.0000 | Freq: Once | INTRAVENOUS | Status: AC | PRN
  Administered 2012-07-27: 11 via INTRAVENOUS

## 2012-08-01 NOTE — Patient Instructions (Addendum)
[facility] wants to go [facility] work/ orders placed for pre cath orders.

## 2012-08-07 ENCOUNTER — Ambulatory Visit (HOSPITAL_COMMUNITY): Payer: PRIVATE HEALTH INSURANCE | Attending: Cardiology | Admitting: Radiology

## 2012-08-07 DIAGNOSIS — R0989 Other specified symptoms and signs involving the circulatory and respiratory systems: Secondary | ICD-10-CM

## 2012-08-07 MED ORDER — REGADENOSON 0.4 MG/5ML IV SOLN
0.4000 mg | Freq: Once | INTRAVENOUS | Status: AC
  Administered 2012-08-07: 0.4 mg via INTRAVENOUS

## 2012-08-07 MED ORDER — TECHNETIUM TC 99M SESTAMIBI GENERIC - CARDIOLITE
33.0000 | Freq: Once | INTRAVENOUS | Status: AC | PRN
  Administered 2012-08-07: 33 via INTRAVENOUS

## 2012-08-07 NOTE — Progress Notes (Signed)
[facility] SITE 3 NUCLEAR MED [address] [id number] [place] [address] [phone]  Cardiology Nuclear Med Study  ZYIAN BROKENSHIRE is a 51 y.o. male     MRN : [id number]     DOB: May 26, 1961  Procedure Date: 08/07/2012  Nuclear Med Background Indication for Stress Test:  Evaluation for Ischemia, Abnormal EKG, and Pending Surgical Clearance for  Bilateral SFA Stent Occlusion by Dr. Fabienne Bruns History:  '08 ECHO: No EF  Cardiac Risk Factors: Claudication, CVA, Family History - CAD, Hypertension, Lipids, PVD and Smoker  Symptoms:  Chest Pain   Nuclear Pre-Procedure Caffeine/Decaff Intake:  None > 12 hrs NPO After: 7:00pm   Lungs:  clear O2 Sat: 97% on room air. IV 0.9% NS with Angio Cath:  22g  IV Site: R Antecubital x 1, tolerated well IV Started by:  Irean Hong, RN  Chest Size (in):  40 Cup Size: n/a  Height: 6\' 1"  (1.854 m)  Weight:  189 lb (85.73 kg)  BMI:  Body mass index is 24.94 kg/(m^2). Tech Comments:  n/a    Nuclear Med Study 1 or 2 day study: 2 day  Stress Test Type:  Lexiscan  Reading MD: Olga Millers, MD  Order Authorizing Provider:  Kristeen Miss, MD  Resting Radionuclide: Technetium 46m Sestamibi  Resting Radionuclide Dose: 11.0 mCi   On    07-27-12  Stress Radionuclide:  Technetium 84m Sestamibi  Stress Radionuclide Dose: 33.0 mCi    On       08-07-12          Stress Protocol Rest HR: 76 Stress HR: 109  Rest BP: 109/79 Stress BP: 121/86  Exercise Time (min): n/a METS: n/a   Predicted Max HR: 171 bpm % Max HR: 63.74 bpm Rate Pressure Product: 13244   Dose of Adenosine (mg):  n/a Dose of Lexiscan: 0.4 mg  Dose of Atropine (mg): n/a Dose of Dobutamine: n/a mcg/kg/min (at max HR)  Stress Test Technologist: Milana Na, EMT-P  Nuclear Technologist:  Doyne Keel, CNMT     Rest Procedure:  Myocardial perfusion imaging was performed at rest 45 minutes following the intravenous administration of Technetium 34m  Sestamibi. Rest ECG: NSR - Normal EKG  Stress Procedure:  The patient received IV Lexiscan 0.4 mg over 15-seconds.  Technetium 56m Sestamibi injected at 30-seconds.  There were no specific changes, + sob, and lt.headed with Lexiscan.  Quantitative spect images were obtained after a 45 minute delay. Stress ECG: No significant change from baseline ECG   QPS Raw Data Images:  Acquisition technically good; normal left ventricular size. Stress Images:  There is decreased uptake in the inferior wall. Rest Images:  Normal homogeneous uptake in all areas of the myocardium. Subtraction (SDS):  These findings are consistent with ischemia. Transient Ischemic Dilatation (Normal <1.22):  1.15 Lung/Heart Ratio (Normal <0.45):  0.27  Quantitative Gated Spect Images QGS EDV:  122 ml QGS ESV:  42 ml  Impression Exercise Capacity:  Lexiscan with no exercise. BP Response:  Normal blood pressure response. Clinical Symptoms:  There is dyspnea. ECG Impression:  No significant ST segment change suggestive of ischemia. Comparison with Prior Nuclear Study: No images to compare  Overall Impression:  Abnormal stress nuclear study with a small, moderate intensity, reversible inferior defect consist with mild ischemia.  LV Ejection Fraction: 65%.  LV Wall Motion:  NL LV Function; NL Wall Motion   Olga Millers

## 2012-08-08 ENCOUNTER — Encounter: Payer: Self-pay | Admitting: *Deleted

## 2012-08-08 ENCOUNTER — Other Ambulatory Visit: Payer: Self-pay | Admitting: Cardiovascular Disease

## 2012-08-08 ENCOUNTER — Encounter (HOSPITAL_COMMUNITY): Payer: PRIVATE HEALTH INSURANCE

## 2012-08-08 NOTE — Addendum Note (Signed)
Addended by: Antony Odea on: 08/08/2012 03:21 PM   Modules accepted: Orders

## 2012-08-09 LAB — CBC WITH DIFFERENTIAL/PLATELET
Basophils Absolute: 0 10*3/uL (ref 0.0–0.1)
Basophils Relative: 1 % (ref 0–1)
Eosinophils Relative: 1 % (ref 0–5)
HCT: 41.1 % (ref 39.0–52.0)
MCHC: 34.3 g/dL (ref 30.0–36.0)
MCV: 86 fL (ref 78.0–100.0)
Monocytes Absolute: 0.4 10*3/uL (ref 0.1–1.0)
RDW: 14.6 % (ref 11.5–15.5)

## 2012-08-09 LAB — BASIC METABOLIC PANEL
BUN: 11 mg/dL (ref 6–23)
Creat: 1.17 mg/dL (ref 0.50–1.35)

## 2012-08-09 LAB — PROTIME-INR
INR: 1.04 (ref <1.50)
Prothrombin Time: 13.6 seconds (ref 11.6–15.2)

## 2012-08-10 ENCOUNTER — Other Ambulatory Visit: Payer: Self-pay | Admitting: Cardiovascular Disease

## 2012-08-11 ENCOUNTER — Encounter (HOSPITAL_BASED_OUTPATIENT_CLINIC_OR_DEPARTMENT_OTHER)
Admission: RE | Disposition: A | Discharge status: Home / Self Care | Payer: Self-pay | Source: Ambulatory Visit | Attending: Cardiovascular Disease

## 2012-08-11 ENCOUNTER — Inpatient Hospital Stay (HOSPITAL_BASED_OUTPATIENT_CLINIC_OR_DEPARTMENT_OTHER)
Admission: RE | Admit: 2012-08-11 | Discharge: 2012-08-11 | Disposition: A | Discharge status: Home / Self Care | Payer: PRIVATE HEALTH INSURANCE | Source: Ambulatory Visit | Attending: Cardiovascular Disease | Admitting: Cardiovascular Disease

## 2012-08-11 DIAGNOSIS — R9439 Abnormal result of other cardiovascular function study: Secondary | ICD-10-CM | POA: Insufficient documentation

## 2012-08-11 DIAGNOSIS — I2510 Atherosclerotic heart disease of native coronary artery without angina pectoris: Secondary | ICD-10-CM

## 2012-08-11 DIAGNOSIS — I739 Peripheral vascular disease, unspecified: Secondary | ICD-10-CM | POA: Insufficient documentation

## 2012-08-11 DIAGNOSIS — Z8673 Personal history of transient ischemic attack (TIA), and cerebral infarction without residual deficits: Secondary | ICD-10-CM | POA: Insufficient documentation

## 2012-08-11 DIAGNOSIS — E785 Hyperlipidemia, unspecified: Secondary | ICD-10-CM | POA: Insufficient documentation

## 2012-08-11 DIAGNOSIS — I10 Essential (primary) hypertension: Secondary | ICD-10-CM | POA: Insufficient documentation

## 2012-08-11 DIAGNOSIS — R079 Chest pain, unspecified: Secondary | ICD-10-CM | POA: Insufficient documentation

## 2012-08-11 SURGERY — JV LEFT HEART CATHETERIZATION WITH CORONARY ANGIOGRAM
Anesthesia: Moderate Sedation

## 2012-08-11 MED ORDER — SODIUM CHLORIDE 0.9 % IV SOLN: INTRAVENOUS | Status: AC

## 2012-08-11 MED ORDER — ONDANSETRON HCL 4 MG/2ML IJ SOLN: 4.0000 mg | Freq: Four times a day (QID) | INTRAMUSCULAR | Status: DC | PRN

## 2012-08-11 MED ORDER — ACETAMINOPHEN 325 MG PO TABS: 650.0000 mg | ORAL_TABLET | ORAL | Status: DC | PRN

## 2012-08-11 NOTE — Interval H&P Note (Signed)
History and Physical Interval Note:  08/11/2012 7:35 AM  Todd [name]  has presented today for surgery, with the diagnosis of abnormal stress  The various methods of treatment have been discussed with the patient and family. After consideration of risks, benefits and other options for treatment, the patient has consented to  Procedure(s) (LRB) with comments: JV LEFT HEART CATHETERIZATION WITH CORONARY ANGIOGRAM (N/A) as a surgical intervention .  The patient's history has been reviewed, patient examined, no change in status, stable for surgery.  I have reviewed the patient's chart and labs.  Questions were answered to the patient's satisfaction.     Elyn Aquas.

## 2012-08-11 NOTE — OR Nursing (Signed)
Discharge instructions reviewed, pt stated understanding, ambulated in hall without difficulty, site level 0, transported to father's car via wheelchair

## 2012-08-11 NOTE — OR Nursing (Signed)
+  Allen's test right hand 

## 2012-08-11 NOTE — CV Procedure (Signed)
Cardiac Cath Note  Todd [name] [id number] 1961-07-01  Procedure: left  Heart Cardiac Catheterization Note Indications:  Chest pain,  Abnormal Myoview study.  Procedure Details Consent: Obtained Time Out: Verified patient identification, verified procedure, site/side was marked, verified correct patient position, special equipment/implants available, Radiology Safety Procedures followed,  medications/allergies/relevent history reviewed, required imaging and test results available.  Performed   Medications: Fentanyl: 75 mcg IV Versed: 3 mg IV  The right radial  artery was easily canulated using a modified Seldinger technique.  The innominate artery was tortuous and required a fair amount of manipulation to get around the arch.  Hemodynamics:   LV pressure: 115/23 Aortic pressure: 111/69  Angiography   Left Main: LM normal  Left anterior Descending: large, minor luminal irregularities. 20-30% stenosis diffusely. Several small diagonals which have only minor luminal irreg.  Left Circumflex: large and dominant,  20-30%, OM 1 is small and normal,  OM2 is a small vessel.  There is a small branch off the OM2 that is occluded and fills slowly via collaterals.  PDA is unremarkable  Right Coronary Artery: small , nondominant, occluded Proximally, fills slowly via collaterals  LV Gram: overall normal LV function .  EF 65%  Complications: No apparent complications Patient did tolerate procedure well.  Contrast used: 110 CC  Conclusions:   1. Mild- moderate irregularities.  The large LAD and the large dominant LCx have only minor luminal irregularities.   He has a small OM branch that is occluded and a small non dominant RCA that is also occluded.   These are likely the source of his abnormal myoview.  2. Normal LV function.    He should be at low risk for his upcoming vascular surgery.   Vesta Mixer, [name], MD, [facility] 08/11/2012, 8:21 AM Office - [phone] Pager  [phone]

## 2012-08-11 NOTE — OR Nursing (Signed)
Meal served 

## 2012-08-11 NOTE — H&P (Signed)
Todd [name]  Date of Birth August 03, 1961  [facility]  [address]  [place], [place] [address] [place], [place] [address]  [phone]  Fax [phone] Fax [phone]  Problem List:  1. Peripheral vascular disease-significant lower extremity PAD - status post bilateral lower extremity stenting in the past.  2. Hypertension  3. Hyperlipidemia  4. CVA-  History of Present Illness:  Todd [name] is a 51 year old gentleman with a history of peripheral vascular disease. He has a history of hypertension and hyperlipidemia. He also has had a stroke. He was recently found to have significant peripheral vascular disease. He has developed significant claudication in his legs-left much greater than the right.  He has occasional episodes of vague chest ache. He did not originally complain of any chest pain but only answered in the affirmative when I asked him. I'm not sure that these chest pains are all that significant. It is not worsened with exertion. It occurs at various times. Not pleuretic.  Current Outpatient Prescriptions on File Prior to Visit   Medication  Sig  Dispense  Refill   .  aspirin 81 MG chewable tablet  Chew 81 mg by mouth every morning.     .  hydrochlorothiazide (HYDRODIURIL) 25 MG tablet  Take 25 mg by mouth daily.     .  rosuvastatin (CRESTOR) 10 MG tablet  Take 10 mg by mouth at bedtime.     .  sildenafil (VIAGRA) 50 MG tablet  Take 50 mg by mouth daily as needed. Erectile dysfunction     .  Tamsulosin HCl (FLOMAX) 0.4 MG CAPS  Take 0.4 mg by mouth at bedtime.      No Known Allergies  Past Medical History   Diagnosis  Date   .  Hypertension    .  Hyperlipidemia    .  Peripheral vascular disease    .  Stroke      2008   .  Chronic kidney disease      moderate    Past Surgical History   Procedure  Date   .  Stents in bil legs     History   Smoking status   .  Current Every Day Smoker -- 0.8  packs/day for 25 years   .  Types:  Cigarettes   Smokeless tobacco   .  Never Used    History   Alcohol Use  No    Family History   Problem  Relation  Age of Onset   .  Diabetes  Mother    .  Heart disease  Mother       before age 24    .  Stroke  Mother       x 2    .  Cancer  Mother    .  Deep vein thrombosis  Mother    .  Hyperlipidemia  Mother    .  Hypertension  Mother    .  Heart attack  Mother    .  Peripheral vascular disease  Mother    .  Cancer  Father       colon    .  Arthritis  Father     Reviw of Systems:  Reviewed in the HPI. All other systems are negative.  Physical Exam:  Blood pressure 122/86, pulse 73, height 6\' 1"  (1.854 m), weight 190 lb 12.8 oz (86.546 kg), SpO2 98.00%.  General: Well developed, well nourished,  in no acute distress.  Head: Normocephalic, atraumatic, sclera non-icteric, mucus membranes are moist,  Neck: Supple. Carotids are 2 + without bruits. No JVD  Lungs: Clear bilaterally to auscultation.  Heart: regular rate. normal S1 S2. No murmurs, gallops or rubs.  Abdomen: Soft, non-tender, non-distended with normal bowel sounds. No hepatomegaly. No rebound/guarding. No masses.  Msk: Strength and tone are normal  Extremities: No clubbing or cyanosis. No edema. Distal pedal pulses are trace  Neuro: Alert and oriented X 3. Moves all extremities spontaneously.  Psych: Responds to questions appropriately with a normal affect.  ECG:  Oct. 23, 2013 - NSR at 67. Voltage for LVH. NS T wave abnormality.  Assessment / Plan:   PVD (peripheral vascular disease) with claudication - Todd [name]., MD 07/19/2012 10:43 AM Signed  Todd [name] presents today for preoperative evaluation prior to peripheral vascular surgery. He has a history of hypertension, hyperlipidemia, and has had a stroke.  He states that he has some chest pains but this was only in response to my asking the question. He did not originally complain of any episodes of chest pain.  Nevertheless, he is at significant risk for having coronary artery disease with his underlying medical problems. We'll schedule him for a [employer] study for further evaluation.  The myoview study revealed inferior reversible defect.  He is scheduled for cardiac cath.  He has had some atypical episodes of CP.  He is not able to walk due to claudication.   Risks, benefits, options were reviewed with patient.  He understands and agrees to proceed.    Todd [name], Todd [name]., MD, [facility] 08/11/2012, 7:33 AM Office - [phone] Pager [phone]

## 2012-08-21 ENCOUNTER — Encounter: Payer: PRIVATE HEALTH INSURANCE | Admitting: Nurse Practitioner

## 2012-08-25 ENCOUNTER — Encounter (HOSPITAL_COMMUNITY): Payer: Self-pay | Admitting: Pharmacy Technician

## 2012-08-28 ENCOUNTER — Telehealth: Payer: Self-pay | Admitting: Neurosurgery

## 2012-08-28 ENCOUNTER — Encounter: Payer: Self-pay | Admitting: Nurse Practitioner

## 2012-08-28 ENCOUNTER — Ambulatory Visit (INDEPENDENT_AMBULATORY_CARE_PROVIDER_SITE_OTHER): Payer: PRIVATE HEALTH INSURANCE | Admitting: Nurse Practitioner

## 2012-08-28 VITALS — BP 100/70 | HR 68 | Ht 73.0 in | Wt 190.8 lb

## 2012-08-28 DIAGNOSIS — Z9889 Other specified postprocedural states: Secondary | ICD-10-CM

## 2012-08-28 NOTE — Patient Instructions (Signed)
I will send Dr. Darrick Penna a note   Try to stop smoking  We will see you back as needed  Call the [facility] office at [phone] if you have any questions, problems or concerns.

## 2012-08-28 NOTE — Telephone Encounter (Signed)
Message copied by Fredrich Birks on Mon Aug 28, 2012 12:16 PM ------      Message from: Phillips Odor      Created: Wed Aug 23, 2012  5:29 PM      Regarding: needs appt for H & P       This pt. Needs appt. 08/31/12 for a pre-op H & P per Rusty (surgery for left Fem-Pop sched. 09/04/12)  Please try to call pt. early AM on 12/2, as he needs 5 days notice for transportation, otherwise we can have Rusty do it on 12/6 for Dr.Fields.

## 2012-08-28 NOTE — Telephone Encounter (Signed)
Patient has been scheduled for 09/01/12 as per Okey Regal, to help accommodate transportation. dpm

## 2012-08-28 NOTE — Progress Notes (Signed)
Todd [name] Friendly Date of Birth: 03-06-1961 Medical Record #[id number]  History of Present Illness: Todd [name] is seen today for a post cath visit. He is seen for Dr. Elease Hashimoto. He has HTN, HLD, past CVA, and PVD. Needed pre op clearance for PV surgery and had a Myoview which led to cardiac cath. Results are noted below. He continues to smoke.   He comes in today. He is here alone. He is doing ok. His arm did fine. He is planning on his procedure with Dr. Darrick Penna in one week. No chest pain. Not short of breath. Still smoking. He is going to call his PCP and ask about Chantix. He has quit with Chantix before. He continues to have pain in the left leg.   Current Outpatient Prescriptions on File Prior to Visit  Medication Sig Dispense Refill  . aspirin 81 MG chewable tablet Chew 81 mg by mouth every morning.      . Eszopiclone (ESZOPICLONE) 3 MG TABS Take 3 mg by mouth at bedtime as needed. Take immediately before bedtime for sleep      . hydrochlorothiazide (HYDRODIURIL) 25 MG tablet Take 25 mg by mouth daily.      . rosuvastatin (CRESTOR) 10 MG tablet Take 10 mg by mouth at bedtime.       . sildenafil (VIAGRA) 50 MG tablet Take 50 mg by mouth daily as needed. Erectile dysfunction      . Tamsulosin HCl (FLOMAX) 0.4 MG CAPS Take 0.4 mg by mouth at bedtime.         No Known Allergies  Past Medical History  Diagnosis Date  . Hypertension   . Hyperlipidemia   . Peripheral vascular disease   . Stroke     2008  . Chronic kidney disease     moderate  . Tobacco abuse   . S/P cardiac cath November 2013    Past Surgical History  Procedure Date  . Stents in bil legs     History  Smoking status  . Current Every Day Smoker -- 0.8 packs/day for 25 years  . Types: Cigarettes  Smokeless tobacco  . Never Used    History  Alcohol Use No    Family History  Problem Relation Age of Onset  . Diabetes Mother   . Heart disease Mother     before age 24  . Stroke Mother     x 2  . Cancer  Mother   . Deep vein thrombosis Mother   . Hyperlipidemia Mother   . Hypertension Mother   . Heart attack Mother   . Peripheral vascular disease Mother   . Cancer Father     colon  . Arthritis Father     Review of Systems: The review of systems is per the HPI.  All other systems were reviewed and are negative.  Physical Exam: BP 100/70  Pulse 68  Ht 6\' 1"  (1.854 m)  Wt 190 lb 12.8 oz (86.546 kg)  BMI 25.17 kg/m2 Patient is pleasant and in no acute distress. Smells of tobacco. Skin is warm and dry. Color is normal.  HEENT is unremarkable. Normocephalic/atraumatic. PERRL. Sclera are nonicteric. Neck is supple. No masses. No JVD. Lungs are clear. Cardiac exam shows a regular rate and rhythm. Abdomen is soft. Extremities are without edema. Gait and ROM are intact. No gross neurologic deficits noted. Right arm looks fine.   LABORATORY DATA:  Cardiac Cath Conclusions:   1. Mild- moderate irregularities. The large LAD and the large  dominant LCx have only minor luminal irregularities. He has a small OM branch that is occluded and a small non dominant RCA that is also occluded. These are likely the source of his abnormal myoview.  2. Normal LV function.   He should be at low risk for his upcoming vascular surgery.   Vesta Mixer, [name], MD, [facility]  08/11/2012, 8:21 AM  Office - [phone]  Pager [phone]   Lab Results  Component Value Date   WBC 8.9 08/08/2012   HGB 14.1 08/08/2012   HCT 41.1 08/08/2012   PLT 224 08/08/2012   GLUCOSE 75 08/08/2012   NA 141 08/08/2012   K 3.8 08/08/2012   CL 100 08/08/2012   CREATININE 1.17 08/08/2012   BUN 11 08/08/2012   CO2 28 08/08/2012   INR 1.04 08/08/2012    Assessment / Plan: 1. S/P cardiac cath - showed be low risk for his upcoming vascular surgery. We will see him back prn.   2. Tobacco abuse - cessation is encouraged.   We will see him back as needed. Patient is agreeable to this plan and will call if any problems develop in  the interim.

## 2012-08-29 ENCOUNTER — Telehealth: Payer: Self-pay | Admitting: Vascular Surgery

## 2012-08-29 NOTE — Telephone Encounter (Signed)
Patient informed via phone, but pt was mostly concerned with cancelling his transportation for 12/06, therefore, I mailed appt info to home address as well, dpm

## 2012-08-29 NOTE — Telephone Encounter (Signed)
Message copied by Fredrich Birks on Tue Aug 29, 2012  1:14 PM ------      Message from: Phillips Odor      Created: Tue Aug 29, 2012 12:48 PM       I called this pt.  He was informed that we will need to cancel surgery and appts. On 12/6, and schedule appt. For him to see [facility] on 09/07/12.  Pls. Call him this afternoon with appt. For 09/07/12 with ABI's.  Thx.            ----- Message -----         From: Fredrich Birks         Sent: 08/29/2012  10:56 AM           To: Conley Simmonds Pullins, RN            Okey Regal- should I call Mr Caudill or do you need to speak with him about pre-admission and surgery being postponed?            Thanks,      Annabelle Harman      ----- Message -----         From: Sherren Kerns, MD         Sent: 08/29/2012  10:48 AM           To: Fredrich Birks            He definitely needs to see me prior to operation.  He will need to find time to come on a Thursday.                  Charles      ----- Message -----         From: Fredrich Birks         Sent: 08/29/2012   9:47 AM           To: Sherren Kerns, MD            Dr Darrick Penna,            Mr Stawicki has been scheduled for a H&P with [facility] on 09/01/12. He has transportation issues and cannot come in on Thursday. I spoke with Okey Regal, and she is wondering if we should delay his surgery and have him come in to see you next week with ABIs.            Thanks,            Annabelle Harman      ----- Message -----         From: Sherren Kerns, MD         Sent: 08/28/2012   5:24 PM           To: Melene Plan, RN, [facility]            Pt needs follow up appt with me for preop fem pop and to review his cardiac workup.  He also needs repeat ABI at that appt.            Leonette Most

## 2012-08-31 ENCOUNTER — Ambulatory Visit: Payer: PRIVATE HEALTH INSURANCE | Admitting: Neurosurgery

## 2012-09-01 ENCOUNTER — Encounter (HOSPITAL_COMMUNITY): Admission: RE | Admit: 2012-09-01 | Payer: PRIVATE HEALTH INSURANCE | Source: Ambulatory Visit

## 2012-09-01 ENCOUNTER — Ambulatory Visit: Payer: PRIVATE HEALTH INSURANCE | Admitting: Neurosurgery

## 2012-09-04 ENCOUNTER — Encounter (HOSPITAL_COMMUNITY): Admission: RE | Payer: Self-pay | Source: Ambulatory Visit

## 2012-09-04 ENCOUNTER — Inpatient Hospital Stay (HOSPITAL_COMMUNITY)
Admission: RE | Admit: 2012-09-04 | Payer: PRIVATE HEALTH INSURANCE | Source: Ambulatory Visit | Admitting: Vascular Surgery

## 2012-09-04 SURGERY — BYPASS GRAFT FEMORAL-POPLITEAL ARTERY
Anesthesia: General | Site: Leg Upper | Laterality: Left

## 2012-09-05 ENCOUNTER — Other Ambulatory Visit: Payer: Self-pay | Admitting: *Deleted

## 2012-09-05 DIAGNOSIS — Z01810 Encounter for preprocedural cardiovascular examination: Secondary | ICD-10-CM

## 2012-09-05 DIAGNOSIS — I739 Peripheral vascular disease, unspecified: Secondary | ICD-10-CM

## 2012-09-06 ENCOUNTER — Encounter: Payer: Self-pay | Admitting: Vascular Surgery

## 2012-09-07 ENCOUNTER — Encounter (INDEPENDENT_AMBULATORY_CARE_PROVIDER_SITE_OTHER): Payer: PRIVATE HEALTH INSURANCE | Admitting: *Deleted

## 2012-09-07 ENCOUNTER — Ambulatory Visit (INDEPENDENT_AMBULATORY_CARE_PROVIDER_SITE_OTHER): Payer: PRIVATE HEALTH INSURANCE | Admitting: Vascular Surgery

## 2012-09-07 ENCOUNTER — Encounter: Payer: Self-pay | Admitting: Vascular Surgery

## 2012-09-07 ENCOUNTER — Encounter: Payer: Self-pay | Admitting: *Deleted

## 2012-09-07 ENCOUNTER — Other Ambulatory Visit: Payer: Self-pay | Admitting: *Deleted

## 2012-09-07 VITALS — BP 130/88 | HR 96 | Resp 18 | Ht 73.0 in | Wt 184.0 lb

## 2012-09-07 DIAGNOSIS — I739 Peripheral vascular disease, unspecified: Secondary | ICD-10-CM

## 2012-09-07 DIAGNOSIS — Z01810 Encounter for preprocedural cardiovascular examination: Secondary | ICD-10-CM
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
## 2012-09-11 ENCOUNTER — Encounter (HOSPITAL_COMMUNITY): Payer: Self-pay

## 2012-09-15 ENCOUNTER — Encounter (HOSPITAL_COMMUNITY)
Admission: RE | Admit: 2012-09-15 | Discharge: 2012-09-15 | Disposition: A | Discharge status: Home / Self Care | Payer: PRIVATE HEALTH INSURANCE | Source: Ambulatory Visit | Attending: Vascular Surgery | Admitting: Vascular Surgery

## 2012-09-15 ENCOUNTER — Encounter (HOSPITAL_COMMUNITY): Payer: Self-pay

## 2012-09-15 LAB — URINALYSIS, ROUTINE W REFLEX MICROSCOPIC
Leukocytes, UA: NEGATIVE
Nitrite: NEGATIVE
Specific Gravity, Urine: 1.007 (ref 1.005–1.030)
Urobilinogen, UA: 0.2 mg/dL (ref 0.0–1.0)
pH: 6 (ref 5.0–8.0)

## 2012-09-15 LAB — TYPE AND SCREEN: Antibody Screen: NEGATIVE

## 2012-09-15 LAB — CBC
MCH: 29.5 pg (ref 26.0–34.0)
MCHC: 34.3 g/dL (ref 30.0–36.0)
Platelets: 172 10*3/uL (ref 150–400)
RBC: 4.95 MIL/uL (ref 4.22–5.81)
RDW: 13.3 % (ref 11.5–15.5)

## 2012-09-15 LAB — COMPREHENSIVE METABOLIC PANEL
ALT: 19 U/L (ref 0–53)
AST: 30 U/L (ref 0–37)
Albumin: 3.9 g/dL (ref 3.5–5.2)
Calcium: 9.8 mg/dL (ref 8.4–10.5)
Sodium: 138 mEq/L (ref 135–145)
Total Protein: 7.6 g/dL (ref 6.0–8.3)

## 2012-09-15 LAB — URINE MICROSCOPIC-ADD ON

## 2012-09-15 LAB — SURGICAL PCR SCREEN: MRSA, PCR: NEGATIVE

## 2012-09-15 NOTE — Pre-Procedure Instructions (Signed)
[address] THOM[address] HERBERS  09/15/2012   Your procedure is scheduled on:  Tuesday September 26, 2012.  Report to [facility] at 0530 AM.  Call this number if you have problems the morning of surgery: [phone]   Remember:   Do not eat food or drink:After Midnight.    Take these medicines the morning of surgery with A SIP OF WATER: NONE   Do not wear jewelry  Do not wear lotions or colognes. You may NOT wear deodorant.   Men may shave face and neck.  Do not bring valuables to the hospital.  Contacts, dentures or bridgework may not be worn into surgery.  Leave suitcase in the car. After surgery it may be brought to your room.  For patients admitted to the hospital, checkout time is 11:00 AM the day of discharge.   Patients discharged the day of surgery will not be allowed to drive home.  Name and phone number of your driver:   Special Instructions: Shower using CHG 2 nights before surgery and the night before surgery.  If you shower the day of surgery use CHG.  Use special wash - you have one bottle of CHG for all showers.  You should use approximately 1/3 of the bottle for each shower.   Please read over the following fact sheets that you were given: Pain Booklet, Coughing and Deep Breathing, Blood Transfusion Information, MRSA Information and Surgical Site Infection Prevention

## 2012-09-15 NOTE — Progress Notes (Signed)
Stress test in [facility] on 08/07/12 and Cardiac cath in [facility] 08/11/12, PCP is Dr. Ethelene Browns as [facility] in [place], [place]. Office # [phone] Fax # [phone] LOV was September 2013. Will request LOV. Patient denied having a sleep study.

## 2012-09-25 MED ORDER — SODIUM CHLORIDE 0.9 % IV SOLN: INTRAVENOUS | Status: DC

## 2012-09-25 MED ORDER — DEXTROSE 5 % IV SOLN
1.5000 g | INTRAVENOUS | Status: AC
  Administered 2012-09-26: 1.5 g via INTRAVENOUS
  Filled 2012-09-25: qty 1.5

## 2012-09-26 ENCOUNTER — Inpatient Hospital Stay (HOSPITAL_COMMUNITY)
Admission: RE | Admit: 2012-09-26 | Discharge: 2012-09-28 | DRG: 253 | Disposition: A | Discharge status: Home / Self Care | Payer: PRIVATE HEALTH INSURANCE | Source: Ambulatory Visit | Attending: Vascular Surgery | Admitting: Vascular Surgery

## 2012-09-26 ENCOUNTER — Encounter (HOSPITAL_COMMUNITY): Payer: Self-pay | Admitting: Anesthesiology

## 2012-09-26 ENCOUNTER — Encounter (HOSPITAL_COMMUNITY)
Admission: RE | Disposition: A | Discharge status: Home / Self Care | Payer: Self-pay | Source: Ambulatory Visit | Attending: Vascular Surgery

## 2012-09-26 ENCOUNTER — Encounter (HOSPITAL_COMMUNITY): Payer: Self-pay | Admitting: *Deleted

## 2012-09-26 ENCOUNTER — Inpatient Hospital Stay (HOSPITAL_COMMUNITY): Payer: PRIVATE HEALTH INSURANCE

## 2012-09-26 ENCOUNTER — Inpatient Hospital Stay (HOSPITAL_COMMUNITY): Payer: PRIVATE HEALTH INSURANCE | Admitting: Anesthesiology

## 2012-09-26 DIAGNOSIS — D62 Acute posthemorrhagic anemia: Secondary | ICD-10-CM | POA: Diagnosis present

## 2012-09-26 DIAGNOSIS — N189 Chronic kidney disease, unspecified: Secondary | ICD-10-CM | POA: Diagnosis present

## 2012-09-26 DIAGNOSIS — Z8673 Personal history of transient ischemic attack (TIA), and cerebral infarction without residual deficits: Secondary | ICD-10-CM

## 2012-09-26 DIAGNOSIS — I70219 Atherosclerosis of native arteries of extremities with intermittent claudication, unspecified extremity: Secondary | ICD-10-CM

## 2012-09-26 DIAGNOSIS — Z7982 Long term (current) use of aspirin: Secondary | ICD-10-CM

## 2012-09-26 DIAGNOSIS — E876 Hypokalemia: Secondary | ICD-10-CM | POA: Diagnosis present

## 2012-09-26 DIAGNOSIS — Z5189 Encounter for other specified aftercare: Secondary | ICD-10-CM

## 2012-09-26 DIAGNOSIS — I129 Hypertensive chronic kidney disease with stage 1 through stage 4 chronic kidney disease, or unspecified chronic kidney disease: Secondary | ICD-10-CM | POA: Diagnosis present

## 2012-09-26 DIAGNOSIS — F17200 Nicotine dependence, unspecified, uncomplicated: Secondary | ICD-10-CM | POA: Diagnosis present

## 2012-09-26 DIAGNOSIS — E785 Hyperlipidemia, unspecified: Secondary | ICD-10-CM | POA: Diagnosis present

## 2012-09-26 HISTORY — PX: FEMORAL-POPLITEAL BYPASS GRAFT: SHX937

## 2012-09-26 LAB — CBC
Platelets: 164 10*3/uL (ref 150–400)
RDW: 13.8 % (ref 11.5–15.5)
WBC: 10.8 10*3/uL — ABNORMAL HIGH (ref 4.0–10.5)

## 2012-09-26 LAB — CREATININE, SERUM
Creatinine, Ser: 1.15 mg/dL (ref 0.50–1.35)
GFR calc Af Amer: 85 mL/min — ABNORMAL LOW (ref >90)
GFR calc non Af Amer: 73 mL/min — ABNORMAL LOW (ref >90)

## 2012-09-26 SURGERY — BYPASS GRAFT FEMORAL-POPLITEAL ARTERY
Anesthesia: General | Site: Leg Upper | Laterality: Left

## 2012-09-26 MED ORDER — DOPAMINE-DEXTROSE 3.2-5 MG/ML-% IV SOLN: 3.0000 ug/kg/min | INTRAVENOUS | Status: DC

## 2012-09-26 MED ORDER — ASPIRIN 81 MG PO CHEW
81.0000 mg | CHEWABLE_TABLET | Freq: Every day | ORAL | Status: DC
  Administered 2012-09-27: 81 mg via ORAL
  Filled 2012-09-26: qty 1

## 2012-09-26 MED ORDER — METOPROLOL TARTRATE 1 MG/ML IV SOLN: 2.0000 mg | INTRAVENOUS | Status: DC | PRN

## 2012-09-26 MED ORDER — DOCUSATE SODIUM 100 MG PO CAPS
100.0000 mg | ORAL_CAPSULE | Freq: Every day | ORAL | Status: DC
  Administered 2012-09-27: 100 mg via ORAL
  Filled 2012-09-26 (×2): qty 1

## 2012-09-26 MED ORDER — SODIUM CHLORIDE 0.9 % IR SOLN
Status: DC | PRN
  Administered 2012-09-26: 09:00:00

## 2012-09-26 MED ORDER — DEXTROSE-NACL 5-0.45 % IV SOLN
INTRAVENOUS | Status: DC
  Administered 2012-09-27: 06:00:00 via INTRAVENOUS

## 2012-09-26 MED ORDER — LIDOCAINE HCL (CARDIAC) 20 MG/ML IV SOLN
INTRAVENOUS | Status: DC | PRN
  Administered 2012-09-26: 40 mg via INTRAVENOUS

## 2012-09-26 MED ORDER — ACETAMINOPHEN 325 MG PO TABS: 325.0000 mg | ORAL_TABLET | ORAL | Status: DC | PRN

## 2012-09-26 MED ORDER — PROPOFOL 10 MG/ML IV BOLUS
INTRAVENOUS | Status: DC | PRN
  Administered 2012-09-26: 30 mg via INTRAVENOUS
  Administered 2012-09-26: 170 mg via INTRAVENOUS

## 2012-09-26 MED ORDER — HYDROMORPHONE HCL PF 1 MG/ML IJ SOLN
INTRAMUSCULAR | Status: AC
  Filled 2012-09-26: qty 1

## 2012-09-26 MED ORDER — PHENOL 1.4 % MT LIQD: 1.0000 | OROMUCOSAL | Status: DC | PRN

## 2012-09-26 MED ORDER — 0.9 % SODIUM CHLORIDE (POUR BTL) OPTIME
TOPICAL | Status: DC | PRN
  Administered 2012-09-26 (×2): 1000 mL

## 2012-09-26 MED ORDER — ONDANSETRON HCL 4 MG/2ML IJ SOLN: 4.0000 mg | Freq: Four times a day (QID) | INTRAMUSCULAR | Status: DC | PRN

## 2012-09-26 MED ORDER — ONDANSETRON HCL 4 MG/2ML IJ SOLN: 4.0000 mg | Freq: Once | INTRAMUSCULAR | Status: DC | PRN

## 2012-09-26 MED ORDER — HEPARIN SODIUM (PORCINE) 1000 UNIT/ML IJ SOLN
INTRAMUSCULAR | Status: DC | PRN
  Administered 2012-09-26: 10000 [IU] via INTRAVENOUS
  Administered 2012-09-26 (×3): 5000 [IU] via INTRAVENOUS

## 2012-09-26 MED ORDER — HYDRALAZINE HCL 20 MG/ML IJ SOLN
10.0000 mg | INTRAMUSCULAR | Status: DC | PRN
  Filled 2012-09-26: qty 0.5

## 2012-09-26 MED ORDER — PROTAMINE SULFATE 10 MG/ML IV SOLN
INTRAVENOUS | Status: DC | PRN
  Administered 2012-09-26: 10 mg via INTRAVENOUS
  Administered 2012-09-26 (×2): 20 mg via INTRAVENOUS

## 2012-09-26 MED ORDER — PANTOPRAZOLE SODIUM 40 MG PO TBEC
40.0000 mg | DELAYED_RELEASE_TABLET | Freq: Every day | ORAL | Status: DC
  Administered 2012-09-27: 40 mg via ORAL
  Filled 2012-09-26 (×2): qty 1

## 2012-09-26 MED ORDER — ONDANSETRON HCL 4 MG/2ML IJ SOLN
INTRAMUSCULAR | Status: DC | PRN
  Administered 2012-09-26: 4 mg via INTRAVENOUS

## 2012-09-26 MED ORDER — HYDROMORPHONE HCL PF 1 MG/ML IJ SOLN
0.2500 mg | INTRAMUSCULAR | Status: DC | PRN
  Administered 2012-09-26 (×2): 0.5 mg via INTRAVENOUS

## 2012-09-26 MED ORDER — ATORVASTATIN CALCIUM 10 MG PO TABS
10.0000 mg | ORAL_TABLET | Freq: Every day | ORAL | Status: DC
  Administered 2012-09-26: 10 mg via ORAL
  Filled 2012-09-26 (×3): qty 1

## 2012-09-26 MED ORDER — PHENYLEPHRINE HCL 10 MG/ML IJ SOLN
INTRAMUSCULAR | Status: DC | PRN
  Administered 2012-09-26: 80 ug via INTRAVENOUS
  Administered 2012-09-26 (×6): 40 ug via INTRAVENOUS

## 2012-09-26 MED ORDER — FENTANYL CITRATE 0.05 MG/ML IJ SOLN
INTRAMUSCULAR | Status: DC | PRN
  Administered 2012-09-26 (×5): 50 ug via INTRAVENOUS
  Administered 2012-09-26: 100 ug via INTRAVENOUS
  Administered 2012-09-26 (×2): 50 ug via INTRAVENOUS
  Administered 2012-09-26: 100 ug via INTRAVENOUS

## 2012-09-26 MED ORDER — MORPHINE SULFATE 2 MG/ML IJ SOLN
INTRAMUSCULAR | Status: AC
  Filled 2012-09-26: qty 1

## 2012-09-26 MED ORDER — MORPHINE SULFATE 2 MG/ML IJ SOLN
2.0000 mg | INTRAMUSCULAR | Status: DC | PRN
  Administered 2012-09-26: 2 mg via INTRAVENOUS

## 2012-09-26 MED ORDER — PHENYLEPHRINE HCL 10 MG/ML IJ SOLN
10.0000 mg | INTRAVENOUS | Status: DC | PRN
  Administered 2012-09-26: 50 ug/min via INTRAVENOUS

## 2012-09-26 MED ORDER — VECURONIUM BROMIDE 10 MG IV SOLR
INTRAVENOUS | Status: DC | PRN
  Administered 2012-09-26 (×2): 1 mg via INTRAVENOUS
  Administered 2012-09-26: 3 mg via INTRAVENOUS
  Administered 2012-09-26: 1 mg via INTRAVENOUS

## 2012-09-26 MED ORDER — ALUM & MAG HYDROXIDE-SIMETH 200-200-20 MG/5ML PO SUSP: 15.0000 mL | ORAL | Status: DC | PRN

## 2012-09-26 MED ORDER — DEXTROSE 5 % IV SOLN
1.5000 g | Freq: Two times a day (BID) | INTRAVENOUS | Status: AC
  Administered 2012-09-26 – 2012-09-27 (×2): 1.5 g via INTRAVENOUS
  Filled 2012-09-26 (×2): qty 1.5

## 2012-09-26 MED ORDER — ALBUMIN HUMAN 5 % IV SOLN
INTRAVENOUS | Status: DC | PRN
  Administered 2012-09-26 (×3): via INTRAVENOUS

## 2012-09-26 MED ORDER — ARTIFICIAL TEARS OP OINT
TOPICAL_OINTMENT | OPHTHALMIC | Status: DC | PRN
  Administered 2012-09-26: 1 via OPHTHALMIC

## 2012-09-26 MED ORDER — OXYCODONE HCL 5 MG PO TABS
5.0000 mg | ORAL_TABLET | ORAL | Status: DC | PRN
  Administered 2012-09-27 (×2): 10 mg via ORAL
  Filled 2012-09-26 (×2): qty 2

## 2012-09-26 MED ORDER — SODIUM CHLORIDE 0.9 % IV SOLN: 500.0000 mL | Freq: Once | INTRAVENOUS | Status: AC | PRN

## 2012-09-26 MED ORDER — THROMBIN 20000 UNITS EX SOLR
CUTANEOUS | Status: AC
  Filled 2012-09-26: qty 20000

## 2012-09-26 MED ORDER — LABETALOL HCL 5 MG/ML IV SOLN
10.0000 mg | INTRAVENOUS | Status: DC | PRN
  Filled 2012-09-26: qty 4

## 2012-09-26 MED ORDER — ACETAMINOPHEN 650 MG RE SUPP: 325.0000 mg | RECTAL | Status: DC | PRN

## 2012-09-26 MED ORDER — ACETAMINOPHEN 10 MG/ML IV SOLN
INTRAVENOUS | Status: AC
  Filled 2012-09-26: qty 100

## 2012-09-26 MED ORDER — ENOXAPARIN SODIUM 30 MG/0.3ML ~~LOC~~ SOLN
30.0000 mg | SUBCUTANEOUS | Status: DC
  Administered 2012-09-27: 30 mg via SUBCUTANEOUS
  Filled 2012-09-26 (×2): qty 0.3

## 2012-09-26 MED ORDER — ROCURONIUM BROMIDE 100 MG/10ML IV SOLN
INTRAVENOUS | Status: DC | PRN
  Administered 2012-09-26: 50 mg via INTRAVENOUS

## 2012-09-26 MED ORDER — TAMSULOSIN HCL 0.4 MG PO CAPS
0.4000 mg | ORAL_CAPSULE | Freq: Every day | ORAL | Status: DC
  Administered 2012-09-26: 0.4 mg via ORAL
  Filled 2012-09-26 (×3): qty 1

## 2012-09-26 MED ORDER — POTASSIUM CHLORIDE CRYS ER 20 MEQ PO TBCR: 20.0000 meq | EXTENDED_RELEASE_TABLET | Freq: Once | ORAL | Status: AC | PRN

## 2012-09-26 MED ORDER — ACETAMINOPHEN 10 MG/ML IV SOLN
1000.0000 mg | Freq: Once | INTRAVENOUS | Status: AC | PRN
  Administered 2012-09-26: 1000 mg via INTRAVENOUS

## 2012-09-26 MED ORDER — MIDAZOLAM HCL 5 MG/5ML IJ SOLN
INTRAMUSCULAR | Status: DC | PRN
  Administered 2012-09-26: 2 mg via INTRAVENOUS

## 2012-09-26 MED ORDER — GUAIFENESIN-DM 100-10 MG/5ML PO SYRP: 15.0000 mL | ORAL_SOLUTION | ORAL | Status: DC | PRN

## 2012-09-26 MED ORDER — GLYCOPYRROLATE 0.2 MG/ML IJ SOLN
INTRAMUSCULAR | Status: DC | PRN
  Administered 2012-09-26: 0.4 mg via INTRAVENOUS

## 2012-09-26 MED ORDER — HYDROCHLOROTHIAZIDE 25 MG PO TABS
25.0000 mg | ORAL_TABLET | Freq: Every day | ORAL | Status: DC
  Administered 2012-09-27: 25 mg via ORAL
  Filled 2012-09-26 (×2): qty 1

## 2012-09-26 MED ORDER — NEOSTIGMINE METHYLSULFATE 1 MG/ML IJ SOLN
INTRAMUSCULAR | Status: DC | PRN
  Administered 2012-09-26: 3 mg via INTRAVENOUS

## 2012-09-26 MED ORDER — SODIUM CHLORIDE 0.9 % IV SOLN
INTRAVENOUS | Status: DC | PRN
  Administered 2012-09-26 (×4): via INTRAVENOUS

## 2012-09-26 MED ORDER — ZOLPIDEM TARTRATE 5 MG PO TABS
5.0000 mg | ORAL_TABLET | Freq: Every evening | ORAL | Status: DC | PRN
  Administered 2012-09-26 – 2012-09-27 (×2): 5 mg via ORAL
  Filled 2012-09-26 (×2): qty 1

## 2012-09-26 MED ORDER — THROMBIN 20000 UNITS EX SOLR
CUTANEOUS | Status: DC | PRN
  Administered 2012-09-26: 14:00:00 via TOPICAL

## 2012-09-26 MED ORDER — IOHEXOL 300 MG/ML  SOLN
INTRAMUSCULAR | Status: DC | PRN
  Administered 2012-09-26: 50 mL via INTRAVENOUS

## 2012-09-26 SURGICAL SUPPLY — 73 items
ADH SKN CLS APL DERMABOND .7 (GAUZE/BANDAGES/DRESSINGS) ×2
BANDAGE ESMARK 6X9 LF (GAUZE/BANDAGES/DRESSINGS) IMPLANT
BNDG CMPR 9X6 STRL LF SNTH (GAUZE/BANDAGES/DRESSINGS)
BNDG ESMARK 6X9 LF (GAUZE/BANDAGES/DRESSINGS)
CANISTER SUCTION 2500CC (MISCELLANEOUS) ×2 IMPLANT
CLIP TI MEDIUM 24 (CLIP) ×2 IMPLANT
CLIP TI WIDE RED SMALL 24 (CLIP) ×5 IMPLANT
CLOTH BEACON ORANGE TIMEOUT ST (SAFETY) ×2 IMPLANT
CORONARY SUCKER SOFT TIP 10052 (MISCELLANEOUS) IMPLANT
COVER SURGICAL LIGHT HANDLE (MISCELLANEOUS) ×2 IMPLANT
CUFF TOURNIQUET SINGLE 24IN (TOURNIQUET CUFF) IMPLANT
CUFF TOURNIQUET SINGLE 34IN LL (TOURNIQUET CUFF) IMPLANT
CUFF TOURNIQUET SINGLE 44IN (TOURNIQUET CUFF) IMPLANT
DECANTER SPIKE VIAL GLASS SM (MISCELLANEOUS) IMPLANT
DERMABOND ADVANCED (GAUZE/BANDAGES/DRESSINGS) ×2
DERMABOND ADVANCED .7 DNX12 (GAUZE/BANDAGES/DRESSINGS) IMPLANT
DRAIN SNY WOU (WOUND CARE) IMPLANT
DRAPE WARM FLUID 44X44 (DRAPE) ×2 IMPLANT
DRAPE X-RAY CASS 24X20 (DRAPES) ×2 IMPLANT
DRSG COVADERM 4X10 (GAUZE/BANDAGES/DRESSINGS) IMPLANT
DRSG COVADERM 4X14 (GAUZE/BANDAGES/DRESSINGS) ×2 IMPLANT
DRSG COVADERM 4X8 (GAUZE/BANDAGES/DRESSINGS) IMPLANT
ELECT REM PT RETURN 9FT ADLT (ELECTROSURGICAL) ×2
ELECTRODE REM PT RTRN 9FT ADLT (ELECTROSURGICAL) ×1 IMPLANT
EVACUATOR SILICONE 100CC (DRAIN) IMPLANT
GAUZE SPONGE 4X4 16PLY XRAY LF (GAUZE/BANDAGES/DRESSINGS) ×2 IMPLANT
GLOVE BIO SURGEON STRL SZ 6.5 (GLOVE) ×6 IMPLANT
GLOVE BIO SURGEON STRL SZ7.5 (GLOVE) ×2 IMPLANT
GLOVE BIOGEL PI IND STRL 7.0 (GLOVE) IMPLANT
GLOVE BIOGEL PI IND STRL 7.5 (GLOVE) IMPLANT
GLOVE BIOGEL PI IND STRL 8 (GLOVE) IMPLANT
GLOVE BIOGEL PI INDICATOR 7.0 (GLOVE) ×4
GLOVE BIOGEL PI INDICATOR 7.5 (GLOVE) ×3
GLOVE BIOGEL PI INDICATOR 8 (GLOVE) ×1
GLOVE SURG SS PI 7.0 STRL IVOR (GLOVE) ×4 IMPLANT
GLOVE SURG SS PI 7.5 STRL IVOR (GLOVE) ×3 IMPLANT
GOWN PREVENTION PLUS XLARGE (GOWN DISPOSABLE) ×5 IMPLANT
GOWN STRL NON-REIN LRG LVL3 (GOWN DISPOSABLE) ×5 IMPLANT
GOWN STRL REIN XL XLG (GOWN DISPOSABLE) ×2 IMPLANT
GRAFT PROPATEN W/RING 6X80X60 (Vascular Products) ×1 IMPLANT
KIT BASIN OR (CUSTOM PROCEDURE TRAY) ×2 IMPLANT
KIT ROOM TURNOVER OR (KITS) ×2 IMPLANT
MARKER GRAFT CORONARY BYPASS (MISCELLANEOUS) IMPLANT
NS IRRIG 1000ML POUR BTL (IV SOLUTION) ×4 IMPLANT
PACK PERIPHERAL VASCULAR (CUSTOM PROCEDURE TRAY) ×2 IMPLANT
PAD ARMBOARD 7.5X6 YLW CONV (MISCELLANEOUS) ×3 IMPLANT
PADDING CAST COTTON 6X4 STRL (CAST SUPPLIES) IMPLANT
SET COLLECT BLD 21X3/4 12 (NEEDLE) ×1 IMPLANT
SPONGE GAUZE 4X4 12PLY (GAUZE/BANDAGES/DRESSINGS) ×1 IMPLANT
SPONGE LAP 18X18 X RAY DECT (DISPOSABLE) ×1 IMPLANT
SPONGE SURGIFOAM ABS GEL 100 (HEMOSTASIS) ×1 IMPLANT
STAPLER VISISTAT 35W (STAPLE) ×2 IMPLANT
STOPCOCK 4 WAY LG BORE MALE ST (IV SETS) ×1 IMPLANT
SUT PROLENE 5 0 C 1 24 (SUTURE) ×6 IMPLANT
SUT PROLENE 6 0 CC (SUTURE) ×6 IMPLANT
SUT PROLENE 7 0 BV 1 (SUTURE) ×8 IMPLANT
SUT PROLENE 7 0 BV1 MDA (SUTURE) IMPLANT
SUT SILK 2 0 SH (SUTURE) ×6 IMPLANT
SUT SILK 3 0 (SUTURE) ×8
SUT SILK 3-0 18XBRD TIE 12 (SUTURE) IMPLANT
SUT VIC AB 2-0 CT1 27 (SUTURE) ×2
SUT VIC AB 2-0 CT1 TAPERPNT 27 (SUTURE) IMPLANT
SUT VIC AB 2-0 CTX 36 (SUTURE) ×4 IMPLANT
SUT VIC AB 3-0 SH 27 (SUTURE) ×16
SUT VIC AB 3-0 SH 27X BRD (SUTURE) ×2 IMPLANT
SUT VIC AB 4-0 PS2 27 (SUTURE) ×4 IMPLANT
TAPE UMBILICAL COTTON 1/8X30 (MISCELLANEOUS) IMPLANT
TOWEL OR 17X24 6PK STRL BLUE (TOWEL DISPOSABLE) ×4 IMPLANT
TOWEL OR 17X26 10 PK STRL BLUE (TOWEL DISPOSABLE) ×4 IMPLANT
TRAY FOLEY CATH 14FRSI W/METER (CATHETERS) ×2 IMPLANT
TUBING EXTENTION W/L.L. (IV SETS) ×1 IMPLANT
UNDERPAD 30X30 INCONTINENT (UNDERPADS AND DIAPERS) ×2 IMPLANT
WATER STERILE IRR 1000ML POUR (IV SOLUTION) ×2 IMPLANT

## 2012-09-26 NOTE — Anesthesia Postprocedure Evaluation (Signed)
  Anesthesia Post-op Note  Patient: Todd [name]  Procedure(s) Performed: Procedure(s) (LRB) with comments: BYPASS GRAFT FEMORAL-POPLITEAL ARTERY (Left) - using 6mm Gore-Tex Propaten Ringed Graft, Vein patch on the proximal and distal anastomosis  Patient Location: PACU  Anesthesia Type:General  Level of Consciousness: awake, alert  and oriented  Airway and Oxygen Therapy: Patient Spontanous Breathing and Patient connected to nasal cannula oxygen  Post-op Pain: mild  Post-op Assessment: Post-op Vital signs reviewed and Patient's Cardiovascular Status Stable  Post-op Vital Signs: stable  Complications: No apparent anesthesia complications

## 2012-09-26 NOTE — Progress Notes (Signed)
Report given to Maria RN.

## 2012-09-26 NOTE — Progress Notes (Signed)
MEDICATION RELATED CONSULT NOTE - INITIAL   Pharmacy consulted for renal antibiotic adjustment. 51yom s/p vascular procedure to receive Zinacef for post surgical prophylaxis. Wt 85kg, CrCl ~35ml/min. Patient has Zinacef 1.5g IV q12h x 2 doses ordered - doses appropriate. Pharmacy will sign off. Please reconsult if additional assistance is needed. Thanks!!  Wilfred Lacy, PharmD Clinical Pharmacist [phone] 09/26/2012, 6:28 PM

## 2012-09-26 NOTE — Op Note (Signed)
Procedure: Left femoral to above-knee popliteal bypass with vein patch proximally and distally, intraoperative arteriogram  Preoperative diagnosis: Left leg claudication pain Postoperative diagnosis: Same  Anesthesia: General  Asst.: Doreatha Massed, PA-C  Operative findings: #1 poor quality greater saphenous vein                      #2 6 mm propaten fem above knee popliteal                                #3 1 vessel runoff via anterior tibial artery Operative details: After obtaining informed consent, the patient was taken to the operating room. The patient was placed in supine position on the operating room table. After induction of general anesthesia and endotracheal intubation, a Foley catheter was placed. Next, the patient's entire left lower extremity was prepped and draped in the usual sterile fashion. A longitudinal incision was then made in the left groin and carried down through the subcutaneous tissues to expose the left common femoral artery. The common femoral artery was dissected free circumferentially. There was a pulse within the common femoral artery. The distal external iliac artery was dissected free circumferentially underneath the inguinal ligament. A vessel loop was also placed around the distal external iliac artery. Dissection was then carried out the level of the femoral bifurcation. The superficial femoral and profunda femoris arteries were dissected free circumferentially and vessel loops placed around these. The artery was soft.  Next the left greater saphenous vein was harvested through several skip incisions in the medial left leg. The vein was of good quality proximally approximately 3 mm but had some sclerotic segments below the knee. Side branches were ligated and divided between silk ties or clips. The medial below knee incision was deepened and the below knee popliteal space was entered.  The popliteal artery was dissected free circumferentially.  It was thickened on  palpation.  The patient was given 10000 units of heparin after creating a tunnel between the heads of the gastrocnemius muscles to the groin subsartorial.  The greater saphenous vein was suture clipped distally and transected. The saphenofemoral junction was then oversewn with a 5-0 Prolene. The vein was gently distended with heparinized saline.  The distal segment was sclerotic but did distend.  After appropriate circulation time of the heparin, the distal right external iliac artery was controlled with a small Cooley clamp. The distal common femoral artery was controlled with a fistula clamp.   A longitudinal opening was made in the common femoral artery just above the femoral bifurcation. The was then spatulated and placed in a non reversed configuration and sewn end of vein to side of artery using a running 5-0 Prolene suture. A valvulotome was then used to lyse all the valves and there was good pulsatile flow through the graft except for the distal several centimeters of vein.  It was obvious at this point that I did not have enough vein to go the below knee popliteal artery.  I reviewed the arteriogram and the above knee popliteal artery at the knee joint looked reasonable.  Graft was marked for orientation.  The above knee saphenectomy incision was deepened down to the level of the fascia. The fascia was opened and the sartorius muscle reflected posteriorly. The above-knee popliteal space was entered. Dissection was carried down to the level of the above-knee popliteal artery this was dissected free circumferentially.Several centimeters of the popliteal  artery was dissected free to make a reasonable area for grafting of the above-knee popliteal distal anastomosis. A subsartorial tunnel was created connecting the above-knee incision to the groin.   The graft was then brought through the subsartorial tunnel down to the above-knee popliteal artery. The above-knee popliteal artery was controlled proximally with  a Henley clamp and distally with a Henley clamp. A longitudinal opening was made in the distal above-knee popliteal artery in an area that was fairly free of calcification. The graft was then cut to length and spatulated and signed end of graft to side of artery using running 6-0 Prolene suture. At completion of the anastomosis everything was forebled backbled and thoroughly flushed. The remainder of the anastomosis was completed and all clamps were removed restoring pulsatile flow to the above-knee popliteal artery.   An intraoperative arteriogram was then obtained using inflow occlusion. A 21-gauge butterfly needle was introduced in the proximal aspect of the graft and with the graft clamped proximally contrast was injected through the butterfly needle. This showed good opacification of the distal anastomosis which was patent but the intraluminal diameter was tiny and I did not think this would stay patent.  The 21 gauge butterfly needle was then removed and the hole repaired with a single 6-0 Prolene suture. The patient had monophasic to biphasic Doppler flow in the dorsalis pedis area of the foot. This augmented approximately 50% with unclamping the graft but I decided to remove the vein at this point because it looked so poor and replace it with a 6 mm Propaten.  The patient was then given 5000 units of intravenous heparin.  After appropriate circulation time of the heparin, the distal right external iliac artery was controlled with a small Cooley clamp. The distal common femoral artery was controlled with a fistula clamp.   A 6 mm Propaten ring supported graft was selected. The vein graft was removed except a small proximal cuff.  The graft was then spatulated and sewn end of graft to side of artery using a running 5-0 Prolene suture.At completion of the anastomosis everything was forebled backbled and thoroughly flushed. The clamps were released and packed with thrombin and gelfoam.  The graft was pulled  taut to length and the distal vein graft removed except a small cuff.  The graft was then sewn distally end to side using a running 6 0 prolene.  At completion of the anastomosis everything was forebled backbled and thoroughly flushed. The anastomosis was secured and hemostasis obtain with 2 repair stitches.  A repeat arteriogram was not performed since the distal anastomosis was patent on the prior agram and the patient had a history of renal dysfunction.  There was a good DP doppler which augmented with unclamping the graft.  After hemostasis was obtained, the deep layers and subcutaneous layers of the above-knee popliteal incision and the saphenectomy incisions were closed with running 3-0 Vicryl suture followed by staples.  The groin was closed in multiple layers with 2 0 and 3 0 Vicryl sutures. The skin was closed with a 4-0 Vicryl subcuticular stitch. The patient tolerated the procedure well and there were no complications. Instrument sponge and needle counts correct in the case. Patient was taken to the recovery in stable condition.   Fabienne Bruns, MD  [facility]  Office: [phone]  Pager: [phone]

## 2012-09-26 NOTE — Anesthesia Procedure Notes (Signed)
Procedure Name: Intubation Date/Time: 09/26/2012 7:37 AM Performed by: Lovie Chol Pre-anesthesia Checklist: Patient identified, Emergency Drugs available, Suction available, Patient being monitored and Timeout performed Patient Re-evaluated:Patient Re-evaluated prior to inductionOxygen Delivery Method: Circle system utilized Preoxygenation: Pre-oxygenation with 100% oxygen Intubation Type: IV induction Ventilation: Mask ventilation without difficulty and Oral airway inserted - appropriate to patient size Laryngoscope Size: Miller and 3 Grade View: Grade I Tube type: Oral Tube size: 8.0 mm Number of attempts: 1 Airway Equipment and Method: Stylet Placement Confirmation: ETT inserted through vocal cords under direct vision,  positive ETCO2,  CO2 detector and breath sounds checked- equal and bilateral Secured at: 22 cm Tube secured with: Tape Dental Injury: Teeth and Oropharynx as per pre-operative assessment

## 2012-09-26 NOTE — Interval H&P Note (Signed)
History and Physical Interval Note:  09/26/2012 7:24 AM  Todd [name]  has presented today for surgery, with the diagnosis of PVD  The various methods of treatment have been discussed with the patient and family. After consideration of risks, benefits and other options for treatment, the patient has consented to  Procedure(s) (LRB) with comments: BYPASS GRAFT FEMORAL-POPLITEAL ARTERY (Left) as a surgical intervention .  The patient's history has been reviewed, patient examined, no change in status, stable for surgery.  I have reviewed the patient's chart and labs.  Questions were answered to the patient's satisfaction.     FIELDS,CHARLES E
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
## 2012-09-26 NOTE — Preoperative (Signed)
Beta Blockers   Reason not to administer Beta Blockers:Not Applicable 

## 2012-09-26 NOTE — Anesthesia Preprocedure Evaluation (Addendum)
Anesthesia Evaluation  Patient identified by MRN, date of birth, ID band Patient awake    Reviewed: Allergy & Precautions, H&P , NPO status , Patient's Chart, lab work & pertinent test results  History of Anesthesia Complications Negative for: history of anesthetic complications  Airway Mallampati: I TM Distance: >3 FB Neck ROM: Full    Dental  (+) Edentulous Upper, Edentulous Lower, Poor Dentition and Dental Advisory Given   Pulmonary Current Smoker,  1 ppd x 20 yrs; quit 2009; restarted 2013 - 1ppd breath sounds clear to auscultation        Cardiovascular hypertension, Pt. on medications + Peripheral Vascular Disease Rate:Normal  Cath 07/2012 LV function normal; mild-moderate irregularities.   Neuro/Psych Slightly weaker on right side. CVA, Residual Symptoms    GI/Hepatic   Endo/Other    Renal/GU Renal InsufficiencyRenal disease     Musculoskeletal   Abdominal   Peds  Hematology   Anesthesia Other Findings   Reproductive/Obstetrics                          Anesthesia Physical Anesthesia Plan  ASA: III  Anesthesia Plan: General   Post-op Pain Management:    Induction: Intravenous  Airway Management Planned: Oral ETT  Additional Equipment:   Intra-op Plan:   Post-operative Plan: Extubation in OR  Informed Consent: I have reviewed the patients History and Physical, chart, labs and discussed the procedure including the risks, benefits and alternatives for the proposed anesthesia with the patient or authorized representative who has indicated his/her understanding and acceptance.   Dental advisory given  Plan Discussed with: CRNA and Surgeon  Anesthesia Plan Comments: (PVD with bilat claudication and Bilat SFA occlusions, S/P bilat SFA stenting H/O CVA 2008 with R. Sided weaknesss Smoker/COPD Htn Nonobstructive CAD by cath 08/19/12 nl LV function  Plan GA with oral  ETT  Kipp Brood, MD)        Anesthesia Quick Evaluation

## 2012-09-26 NOTE — Transfer of Care (Signed)
Immediate Anesthesia Transfer of Care Note  Patient: Todd [name]  Procedure(s) Performed: Procedure(s) (LRB) with comments: BYPASS GRAFT FEMORAL-POPLITEAL ARTERY (Left) - using 6mm Gore-Tex Propaten Ringed Graft, Vein patch on the proximal and distal anastomosis  Patient Location: PACU  Anesthesia Type:General  Level of Consciousness: oriented and sedated  Airway & Oxygen Therapy: Patient Spontanous Breathing and Patient connected to nasal cannula oxygen  Post-op Assessment: Report given to PACU RN and Post -op Vital signs reviewed and stable  Post vital signs: Reviewed  Complications: No apparent anesthesia complications

## 2012-09-27 ENCOUNTER — Encounter (HOSPITAL_COMMUNITY): Payer: Self-pay | Admitting: General Practice

## 2012-09-27 LAB — CBC
MCH: 29.8 pg (ref 26.0–34.0)
MCV: 86.1 fL (ref 78.0–100.0)
Platelets: 161 10*3/uL (ref 150–400)
RDW: 13.7 % (ref 11.5–15.5)
WBC: 10.1 10*3/uL (ref 4.0–10.5)

## 2012-09-27 LAB — BASIC METABOLIC PANEL
Calcium: 8.5 mg/dL (ref 8.4–10.5)
Chloride: 106 mEq/L (ref 96–112)
Creatinine, Ser: 1.12 mg/dL (ref 0.50–1.35)
GFR calc Af Amer: 87 mL/min — ABNORMAL LOW (ref >90)

## 2012-09-27 MED ORDER — POTASSIUM CHLORIDE CRYS ER 20 MEQ PO TBCR
20.0000 meq | EXTENDED_RELEASE_TABLET | Freq: Three times a day (TID) | ORAL | Status: AC
  Administered 2012-09-27 (×3): 20 meq via ORAL
  Filled 2012-09-27 (×3): qty 1

## 2012-09-27 MED ORDER — OXYCODONE HCL 5 MG PO TABS: 5.0000 mg | ORAL_TABLET | Freq: Four times a day (QID) | ORAL | Status: DC | PRN

## 2012-09-27 NOTE — Evaluation (Signed)
Physical Therapy Evaluation Patient Details Name: Todd [name] MRN: [id number] DOB: August 10, 1961 Today's Date: 09/27/2012 Time: 7829-5621 PT Time Calculation (min): 10 min  PT Assessment / Plan / Recommendation Clinical Impression  Pt is a 52 y/o male admitted s/p left femoral to popliteal bypass graft along with the below PT problem list. Pt would benefit from acute PT to maximize independence and facilitate d/c home with HHPT (if needed).    PT Assessment  Patient needs continued PT services    Follow Up Recommendations  Home health PT    Does the patient have the potential to tolerate intense rehabilitation      Barriers to Discharge None      Equipment Recommendations  Rolling walker with 5" wheels    Recommendations for Other Services     Frequency Min 3X/week    Precautions / Restrictions Precautions Precautions: Fall Restrictions Weight Bearing Restrictions: No   Pertinent Vitals/Pain 7/10 in left LE. Pt repositioned.      Mobility  Bed Mobility Bed Mobility: Supine to Sit Supine to Sit: 5: Supervision Details for Bed Mobility Assistance: Verbal cues for safety. Transfers Transfers: Sit to Stand;Stand to Sit Sit to Stand: 4: Min guard;With upper extremity assist;From bed Stand to Sit: 4: Min guard;With upper extremity assist;To chair/3-in-1 Details for Transfer Assistance: Guarding for balance with cues for safest hand/left LE placement. Ambulation/Gait Ambulation/Gait Assistance: 4: Min guard Ambulation Distance (Feet): 180 Feet Assistive device: Rolling walker Ambulation/Gait Assistance Details: Guarding for balance with cues for safety with RW and tall posture. Gait Pattern: Step-to pattern;Decreased step length - left;Decreased stance time - left;Trunk flexed Stairs: No Wheelchair Mobility Wheelchair Mobility: No    Shoulder Instructions     Exercises     PT Diagnosis: Difficulty walking;Acute pain  PT Problem List: Decreased strength;Decreased  activity tolerance;Decreased balance;Decreased mobility;Decreased knowledge of use of DME;Pain PT Treatment Interventions: DME instruction;Gait training;Functional mobility training;Therapeutic activities;Balance training;Patient/family education   PT Goals Acute Rehab PT Goals PT Goal Formulation: With patient Time For Goal Achievement: 10/04/12 Potential to Achieve Goals: Good Pt will go Supine/Side to Sit: with modified independence PT Goal: Supine/Side to Sit - Progress: Goal set today Pt will go Sit to Supine/Side: with modified independence PT Goal: Sit to Supine/Side - Progress: Goal set today Pt will go Sit to Stand: with modified independence PT Goal: Sit to Stand - Progress: Goal set today Pt will go Stand to Sit: with modified independence PT Goal: Stand to Sit - Progress: Goal set today Pt will Ambulate: >150 feet;with modified independence;with least restrictive assistive device PT Goal: Ambulate - Progress: Goal set today  Visit Information  Last PT Received On: 09/27/12 Assistance Needed: +1    Subjective Data  Subjective: "I am never going to give up!" Patient Stated Goal: Go home.   Prior Functioning  Home Living Lives With: Alone Available Help at Discharge: Family;Available PRN/intermittently Type of Home: Apartment Home Access: Level entry Home Layout: One level Home Adaptive Equipment: None Prior Function Level of Independence: Independent Able to Take Stairs?: Yes Driving: Yes Communication Communication: No difficulties    Cognition  Overall Cognitive Status: Appears within functional limits for tasks assessed/performed Arousal/Alertness: Awake/alert Orientation Level: Appears intact for tasks assessed Behavior During Session: [facility] for tasks performed    Extremity/Trunk Assessment Right Upper Extremity Assessment RUE ROM/Strength/Tone: Within functional levels RUE Coordination: WFL - gross motor Left Upper Extremity Assessment LUE  ROM/Strength/Tone: Within functional levels LUE Coordination: WFL - gross motor Right Lower Extremity Assessment  RLE ROM/Strength/Tone: Within functional levels RLE Sensation: WFL - Light Touch RLE Coordination: WFL - gross motor Left Lower Extremity Assessment LLE ROM/Strength/Tone: Within functional levels LLE Sensation: WFL - Light Touch LLE Coordination: WFL - gross motor Trunk Assessment Trunk Assessment: Normal   Balance Balance Balance Assessed: No  End of Session PT - End of Session Equipment Utilized During Treatment: Gait belt Activity Tolerance: Patient tolerated treatment well Patient left: in chair;with call bell/phone within reach Nurse Communication: Mobility status  GP     Cephus Shelling 09/27/2012, 1:30 PM  09/27/2012 Cephus Shelling, PT, DPT [phone]

## 2012-09-27 NOTE — Progress Notes (Signed)
Report called to [facility], receiving RN on  2000. VSS. Transferred to 2003 via wheelchair with personal belongings. Called patient's family member with new room number.  [facility]

## 2012-09-27 NOTE — Progress Notes (Addendum)
[facility] Progress Note  09/27/2012 7:54 AM POD 1  Subjective:  No complaints this am.  Wanting breakfast.  Afebrile x 24 hrs VSS 97%RA  Filed Vitals:   09/27/12 0751  BP:   Pulse:   Temp: 98.6 F (37 C)  Resp:     Physical Exam: Incisions:  LLE incisions covered with bandage and c/d/i.  Left groin in tact. Extremities:  + Doppler signal left PT.  Left foot is warm and well perfused.  CBC    Component Value Date/Time   WBC 10.1 09/27/2012 0538   RBC 3.52* 09/27/2012 0538   HGB 10.5* 09/27/2012 0538   HCT 30.3* 09/27/2012 0538   PLT 161 09/27/2012 0538   MCV 86.1 09/27/2012 0538   MCH 29.8 09/27/2012 0538   MCHC 34.7 09/27/2012 0538   RDW 13.7 09/27/2012 0538   LYMPHSABS 3.0 08/08/2012 1640   MONOABS 0.4 08/08/2012 1640   EOSABS 0.1 08/08/2012 1640   BASOSABS 0.0 08/08/2012 1640    BMET    Component Value Date/Time   NA 139 09/27/2012 0538   K 3.1* 09/27/2012 0538   CL 106 09/27/2012 0538   CO2 25 09/27/2012 0538   GLUCOSE 126* 09/27/2012 0538   BUN 8 09/27/2012 0538   CREATININE 1.12 09/27/2012 0538   CREATININE 1.17 08/08/2012 1640   CALCIUM 8.5 09/27/2012 0538   GFRNONAA 75* 09/27/2012 0538   GFRAA 87* 09/27/2012 0538    INR    Component Value Date/Time   INR 1.04 08/08/2012 0000     Intake/Output Summary (Last 24 hours) at 09/27/12 0754 Last data filed at 09/27/12 0500  Gross per 24 hour  Intake   5200 ml  Output   2055 ml  Net   3145 ml     Assessment/Plan:  52 y.o. male is s/p Left femoral to above-knee popliteal bypass with vein patch proximally and distally, intraoperative arteriogram  POD 1  -hypokalemia-supplement with K-dur 20 mEq tid x 3 doses.  Will check K+ in am. -acute surgical blood loss anemia-tolerating -mobilize today -transfer to 2000 -BUN/Cr WNL and good UOP after intraop arteriogram   Doreatha Massed, PA-C [facility] [phone] 09/27/2012 7:54 AM   Agree with above  left leg well perfused left foot  warm 2+ popliteal graft palpable Sitting in chair ready to transfer to 2000 and begin ambulation-home in a few days

## 2012-09-28 ENCOUNTER — Telehealth: Payer: Self-pay | Admitting: Vascular Surgery

## 2012-09-28 ENCOUNTER — Encounter (HOSPITAL_COMMUNITY): Payer: Self-pay | Admitting: Vascular Surgery

## 2012-09-28 MED ORDER — POTASSIUM CHLORIDE CRYS ER 20 MEQ PO TBCR
40.0000 meq | EXTENDED_RELEASE_TABLET | Freq: Once | ORAL | Status: DC
  Filled 2012-09-28: qty 2

## 2012-09-28 NOTE — Discharge Summary (Signed)
[facility] Discharge Summary  Todd [name] [birth year] 52 y.o. male  [id number]  Admission Date: 09/26/2012  Discharge Date: 09/28/12  Physician: Sherren Kerns, MD  Admission Diagnosis: PVD   HPI:   This is a 52 y.o. male who presents for evaluation of bilateral lower extremity claudication. He also complains of some pain over a lump on the dorsal aspect of his right foot. He previously had bilateral superficial femoral artery stents in May and August of 2012 at [place]. These were both found to be occluded on an arteriogram a few months ago. This popliteal artery was patent bilaterally. The stent on the right side extended all the way down to the joint. The below-knee popliteal artery was open with primarily runoff via anterior tibial artery to the dorsalis pedis artery. In the left lower extremity above-knee popliteal artery was more healthy in appearance and the below-knee popliteal artery was also patent with one-vessel runoff via the anterior tibial to the dorsalis pedis. He states his claudication symptoms improved for a few months after the stents were placed but then he returned to his baseline. He currently can walk one half block before experiencing claudication symptoms. He smokes one half to one pack of cigarettes per day. He has used some marijuana in the past but currently is not smoking this. He also had a stroke in 2008 and at that time had some renal dysfunction. He states that currently his kidneys are functioning at 50%. Other medical problems include hypertension, hyperlipidemia. These are currently controlled. Greater than 3 minutes they were spent regarding smoking cessation counseling. He was recently seen by cardiology and had a cath which showed mild coronary stenosis but he was thought to be at low risk for femoropopliteal bypass.  Hospital Course:  The patient was admitted to the hospital and taken to the operating room on 09/26/2012 and  underwent  Left femoral to above-knee popliteal bypass with vein patch proximally and distally, intraoperative arteriogram    The pt tolerated the procedure well and was transported to the PACU in good condition. By POD 1, he was doing well.  He did have hypokalemia and this was supplemented.  By POD 2, he is ambulating without difficulty.   His K+ still low on POD 2 and is supplemented before discharge.  The remainder of the hospital course consisted of increasing mobilization and increasing intake of solids without difficulty.  CBC    Component Value Date/Time   WBC 10.1 09/27/2012 0538   RBC 3.52* 09/27/2012 0538   HGB 10.5* 09/27/2012 0538   HCT 30.3* 09/27/2012 0538   PLT 161 09/27/2012 0538   MCV 86.1 09/27/2012 0538   MCH 29.8 09/27/2012 0538   MCHC 34.7 09/27/2012 0538   RDW 13.7 09/27/2012 0538   LYMPHSABS 3.0 08/08/2012 1640   MONOABS 0.4 08/08/2012 1640   EOSABS 0.1 08/08/2012 1640   BASOSABS 0.0 08/08/2012 1640    BMET    Component Value Date/Time   NA 139 09/27/2012 0538   K 3.1* 09/28/2012 0540   CL 106 09/27/2012 0538   CO2 25 09/27/2012 0538   GLUCOSE 126* 09/27/2012 0538   BUN 8 09/27/2012 0538   CREATININE 1.12 09/27/2012 0538   CREATININE 1.17 08/08/2012 1640   CALCIUM 8.5 09/27/2012 0538   GFRNONAA 75* 09/27/2012 0538   GFRAA 87* 09/27/2012 0538     Discharge Instructions:   The patient is discharged to home with extensive instructions on wound care and progressive ambulation.  They are instructed not to drive or perform any heavy lifting until returning to see the physician in his office.  Discharge Orders    Future Orders Please Complete By Expires   Resume previous diet      Driving Restrictions      Comments:   No driving for 2 weeks   Lifting restrictions      Comments:   No lifting for 4 weeks   Call MD for:  temperature >100.5      Call MD for:  redness, tenderness, or signs of infection (pain, swelling, bleeding, redness, odor or green/yellow discharge around incision  site)      Call MD for:  severe or increased pain, loss or decreased feeling  in affected limb(s)      Discharge wound care:      Comments:   Shower daily with soap and water starting 09/29/12      Discharge Diagnosis:  PVD  Secondary Diagnosis: Patient Active Problem List  Diagnosis  . Atherosclerosis of native arteries of the extremities with intermittent claudication  . PVD (peripheral vascular disease) with claudication  . Hypertension  . Hyperlipidemia  . PVD (peripheral vascular disease)   Past Medical History  Diagnosis Date  . Hypertension   . Hyperlipidemia   . Peripheral vascular disease   . Stroke     2008  . Chronic kidney disease     moderate  . Tobacco abuse   . S/P cardiac cath November 2013      Shiquan, Rapisarda  Home Medication Instructions [id number]   Printed on:09/28/12 0729  Medication Information                    rosuvastatin (CRESTOR) 10 MG tablet Take 10 mg by mouth at bedtime.            sildenafil (VIAGRA) 50 MG tablet Take 50 mg by mouth daily as needed. Erectile dysfunction           Tamsulosin HCl (FLOMAX) 0.4 MG CAPS Take 0.4 mg by mouth at bedtime.            aspirin 81 MG chewable tablet Chew 81 mg by mouth every morning.           hydrochlorothiazide (HYDRODIURIL) 25 MG tablet Take 25 mg by mouth daily.           Eszopiclone (ESZOPICLONE) 3 MG TABS Take 3 mg by mouth at bedtime as needed. Take immediately before bedtime for sleep           oxyCODONE (OXY IR/ROXICODONE) 5 MG immediate release tablet Take 1 tablet (5 mg total) by mouth every 6 (six) hours as needed. #30 NR            Disposition: home  Patient's condition: is Good  Follow up: 1. Dr. Darrick Penna in 2 weeks   Doreatha Massed, PA-C [facility] [phone] 09/28/2012  7:29 AM

## 2012-09-28 NOTE — Progress Notes (Signed)
Retro ur ins review. 

## 2012-09-28 NOTE — Telephone Encounter (Signed)
Spoke with pt to make aware of appt, dpm

## 2012-09-28 NOTE — Progress Notes (Addendum)
[facility] Progress Note  09/28/2012 7:17 AM POD 2  Subjective:  Pre op pain resolved.  Pt is ready to go home.  Tm 99.6 now 97.6 VSS 91% RA  Filed Vitals:   09/28/12 0510  BP: 147/82  Pulse: 66  Temp: 97.6 F (36.4 C)  Resp: 20    Physical Exam: Incisions:  Distal incisions are c/d/i with staples in tact.  Incision below groin has minimal bloody drainage at the distal portion of the incision.  Groin incision c/d/i.   Extremities:  Left foot is warm and well perfused.  CBC    Component Value Date/Time   WBC 10.1 09/27/2012 0538   RBC 3.52* 09/27/2012 0538   HGB 10.5* 09/27/2012 0538   HCT 30.3* 09/27/2012 0538   PLT 161 09/27/2012 0538   MCV 86.1 09/27/2012 0538   MCH 29.8 09/27/2012 0538   MCHC 34.7 09/27/2012 0538   RDW 13.7 09/27/2012 0538   LYMPHSABS 3.0 08/08/2012 1640   MONOABS 0.4 08/08/2012 1640   EOSABS 0.1 08/08/2012 1640   BASOSABS 0.0 08/08/2012 1640    BMET    Component Value Date/Time   NA 139 09/27/2012 0538   K 3.1* 09/28/2012 0540   CL 106 09/27/2012 0538   CO2 25 09/27/2012 0538   GLUCOSE 126* 09/27/2012 0538   BUN 8 09/27/2012 0538   CREATININE 1.12 09/27/2012 0538   CREATININE 1.17 08/08/2012 1640   CALCIUM 8.5 09/27/2012 0538   GFRNONAA 75* 09/27/2012 0538   GFRAA 87* 09/27/2012 0538    INR    Component Value Date/Time   INR 1.04 08/08/2012 0000     Intake/Output Summary (Last 24 hours) at 09/28/12 0717 Last data filed at 09/28/12 0507  Gross per 24 hour  Intake    720 ml  Output   2200 ml  Net  -1480 ml     Assessment/Plan:  52 y.o. male is s/p Left femoral to above-knee popliteal bypass with vein patch proximally and distally, intraoperative arteriogram  POD 2  -pt doing very well-pre-op pain resolved -discussed importance of smoking cessation-pt has Rx for chantix at home and told him to start this when he is discharged. -graft is patent -will discharge home today and f/u with Dr. Darrick Penna in 2 weeks.   Doreatha Massed,  PA-C [facility] [phone] 09/28/2012 7:17 AM   Left foot warm Claudication symptoms improved Pain controlled incisions healing D/c home  Fabienne Bruns, MD [facility] [place] Office: [phone] Pager: [phone]

## 2012-09-28 NOTE — Telephone Encounter (Signed)
Message copied by Fredrich Birks on Thu Sep 28, 2012  3:31 PM ------      Message from: Lorin Mercy K      Created: Thu Sep 28, 2012  1:20 PM      Regarding: schedule                   ----- Message -----         From: Dara Lords, PA         Sent: 09/27/2012   8:08 AM           To: Sharee Pimple, CMA            S/p left fem pop by Dr. Darrick Penna.  F/u with him in 2 weeks.            Thanks,      Lelon Mast

## 2012-10-11 ENCOUNTER — Encounter: Payer: Self-pay | Admitting: Vascular Surgery

## 2012-10-12 ENCOUNTER — Ambulatory Visit (INDEPENDENT_AMBULATORY_CARE_PROVIDER_SITE_OTHER): Payer: PRIVATE HEALTH INSURANCE | Admitting: Vascular Surgery

## 2012-10-12 ENCOUNTER — Encounter: Payer: Self-pay | Admitting: Vascular Surgery

## 2012-10-12 VITALS — BP 115/77 | HR 80 | Ht 72.0 in | Wt 189.0 lb

## 2012-10-12 DIAGNOSIS — Z48812 Encounter for surgical aftercare following surgery on the circulatory system: Secondary | ICD-10-CM

## 2012-10-12 DIAGNOSIS — I70219 Atherosclerosis of native arteries of extremities with intermittent claudication, unspecified extremity: Secondary | ICD-10-CM

## 2012-10-12 NOTE — Progress Notes (Signed)
Patient is a 52 year old male who is status post left femoral to above-knee popliteal bypass with propaten on December 31. A vein patch was placed proximally and distally. He had poor quality saphenous vein. He reports that his left leg feels improved. He does not really describe claudication symptoms in the left leg at this point. He has no incisional drainage.  Physical exam: Filed Vitals:   10/12/12 1153  BP: 115/77  Pulse: 80  Height: 6' (1.829 m)  Weight: 189 lb (85.73 kg)  SpO2: 100%   Left lower extremity: All incisions well-healed staples were removed today. The left groin may have a small seroma or just thickened scar tissue. Left foot is pink and warm but pedal pulses are nonpalpable.  Assessment: Patent left femoral above-knee popliteal and with healing incisions. The patient is interested in having a right leg bypass on as well. He will return for followup in 3 months. At that time we will consider whether or not he is ready for the right leg to be done. He will have a graft duplex and ABIs with he returns for that office visit. He was encouraged today to refrain from smoking and ambulate.  Plan: Followup in 3 months. If the patient desires a right femoropopliteal bypass at his next office visit. We will vein map his right greater saphenous vein since the left vein was of poor quality.  Fabienne Bruns, MD [facility] [place] Office: [phone] Pager: [phone]

## 2012-10-13 NOTE — Addendum Note (Signed)
Addended by: Sharee Pimple on: 10/13/2012 08:21 AM   Modules accepted: Orders

## 2012-11-07 ENCOUNTER — Telehealth: Payer: Self-pay | Admitting: *Deleted

## 2012-11-07 NOTE — Telephone Encounter (Signed)
Mr. Bonsell called to report that he had 2 stitches that had migrated to the surface (S/P left fem-AK popliteal BPG on 09-26-12). He reports that his wounds are well healed and have not changed any since Dr. Darrick Penna saw him in January. These are not bothering him and he is covering them with a clean bandaid.  He didn't want to make an appt at this time to come in to let us take care of these sutures, but will call back in a couple of weeks. I instructed him on cleaning and care of his postop wounds. He is afebrile and reports no drainage. Mr. Gidney will call us if any change in status, he voiced understanding of this plan and is in agreement.

## 2013-01-24 ENCOUNTER — Encounter: Payer: Self-pay | Admitting: Vascular Surgery

## 2013-01-25 ENCOUNTER — Encounter: Payer: Self-pay | Admitting: Vascular Surgery

## 2013-01-25 ENCOUNTER — Ambulatory Visit: Payer: PRIVATE HEALTH INSURANCE | Admitting: Vascular Surgery

## 2013-01-25 ENCOUNTER — Encounter (INDEPENDENT_AMBULATORY_CARE_PROVIDER_SITE_OTHER): Payer: PRIVATE HEALTH INSURANCE | Admitting: *Deleted

## 2013-01-25 ENCOUNTER — Ambulatory Visit (INDEPENDENT_AMBULATORY_CARE_PROVIDER_SITE_OTHER): Payer: PRIVATE HEALTH INSURANCE | Admitting: Vascular Surgery

## 2013-01-25 VITALS — BP 128/85 | HR 78 | Ht 72.0 in | Wt 176.4 lb

## 2013-01-25 DIAGNOSIS — Z48812 Encounter for surgical aftercare following surgery on the circulatory system: Secondary | ICD-10-CM

## 2013-01-25 DIAGNOSIS — I70219 Atherosclerosis of native arteries of extremities with intermittent claudication, unspecified extremity: Secondary | ICD-10-CM

## 2013-01-25 NOTE — Addendum Note (Signed)
Addended by: Adria Dill L on: 01/25/2013 12:11 PM   Modules accepted: Orders

## 2013-01-25 NOTE — Progress Notes (Signed)
Patient is a 52 year old male who is status post left femoral to above-knee popliteal bypass with propaten on December 31. A vein patch was placed proximally and distally. He had poor quality saphenous vein. He reports that his left leg feels improved. He does not really describe claudication symptoms in the left leg at this point. He has no incisional drainage. He is walking without any pain in the left leg. He still experiences some claudication symptoms in the right leg. Unfortunately he continues to smoke approximately one pack of cigarettes per day. Greater than 3 minutes they were spent regarding smoking cessation counseling.  Review of systems: He denies shortness of breath. He denies chest pain.  Physical exam:  Filed Vitals:   01/25/13 1107  BP: 128/85  Pulse: 78  Height: 6' (1.829 m)  Weight: 176 lb 6.4 oz (80.015 kg)  SpO2: 100%   Neck: No carotid bruits  Lower extremities: 2+ femoral absent right popliteal and pedal pulses, well-healed incisions left leg no palpable pedal pulses left foot but warm known to have primarily peroneal runoff  Data: Patient had bilateral ABIs today which were 0.98 on the left 0.66 on the right duplex of his left lower extremity bypass graft is widely patent with no increased velocities  Assessment: Patent left femoral popliteal bypass. Claudication right lower extremity stable.  Tobacco abuse  Plan: The patient will continue walking 30 minutes daily. He will try to quit smoking. He will followup in August of 2014 to consider whether or not he wishes to have a right femoropopliteal bypass performed.  Fabienne Bruns, MD [facility] Office: [phone] Pager: [phone]

## 2013-04-16 ENCOUNTER — Encounter (HOSPITAL_COMMUNITY): Payer: Self-pay | Admitting: *Deleted

## 2013-04-16 ENCOUNTER — Emergency Department (HOSPITAL_COMMUNITY): Payer: No Typology Code available for payment source

## 2013-04-16 ENCOUNTER — Emergency Department (HOSPITAL_COMMUNITY)
Admission: EM | Admit: 2013-04-16 | Discharge: 2013-04-16 | Disposition: A | Discharge status: Home / Self Care | Payer: No Typology Code available for payment source | Attending: Emergency Medicine | Admitting: Emergency Medicine

## 2013-04-16 DIAGNOSIS — Z79899 Other long term (current) drug therapy: Secondary | ICD-10-CM | POA: Insufficient documentation

## 2013-04-16 DIAGNOSIS — Z7982 Long term (current) use of aspirin: Secondary | ICD-10-CM | POA: Insufficient documentation

## 2013-04-16 DIAGNOSIS — S139XXA Sprain of joints and ligaments of unspecified parts of neck, initial encounter: Secondary | ICD-10-CM | POA: Insufficient documentation

## 2013-04-16 DIAGNOSIS — Y92410 Unspecified street and highway as the place of occurrence of the external cause: Secondary | ICD-10-CM | POA: Insufficient documentation

## 2013-04-16 DIAGNOSIS — Z8673 Personal history of transient ischemic attack (TIA), and cerebral infarction without residual deficits: Secondary | ICD-10-CM | POA: Insufficient documentation

## 2013-04-16 DIAGNOSIS — I129 Hypertensive chronic kidney disease with stage 1 through stage 4 chronic kidney disease, or unspecified chronic kidney disease: Secondary | ICD-10-CM | POA: Insufficient documentation

## 2013-04-16 DIAGNOSIS — F17200 Nicotine dependence, unspecified, uncomplicated: Secondary | ICD-10-CM | POA: Insufficient documentation

## 2013-04-16 DIAGNOSIS — N189 Chronic kidney disease, unspecified: Secondary | ICD-10-CM | POA: Insufficient documentation

## 2013-04-16 DIAGNOSIS — E785 Hyperlipidemia, unspecified: Secondary | ICD-10-CM | POA: Insufficient documentation

## 2013-04-16 DIAGNOSIS — M7989 Other specified soft tissue disorders: Secondary | ICD-10-CM | POA: Insufficient documentation

## 2013-04-16 DIAGNOSIS — Y9389 Activity, other specified: Secondary | ICD-10-CM | POA: Insufficient documentation

## 2013-04-16 DIAGNOSIS — Z8679 Personal history of other diseases of the circulatory system: Secondary | ICD-10-CM | POA: Insufficient documentation

## 2013-04-16 DIAGNOSIS — S161XXA Strain of muscle, fascia and tendon at neck level, initial encounter: Secondary | ICD-10-CM

## 2013-04-16 MED ORDER — CYCLOBENZAPRINE HCL 5 MG PO TABS: 5.0000 mg | ORAL_TABLET | Freq: Three times a day (TID) | ORAL | Status: DC | PRN

## 2013-04-16 MED ORDER — HYDROCODONE-ACETAMINOPHEN 5-325 MG PO TABS: 1.0000 | ORAL_TABLET | ORAL | Status: DC | PRN
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
## 2013-04-17 NOTE — ED Provider Notes (Signed)
Medical screening examination/treatment/procedure(s) were performed by non-physician practitioner and as supervising physician I was immediately available for consultation/collaboration.    Sangita Zani D Jarren Para, MD 04/17/13 0911 

## 2013-05-31 ENCOUNTER — Ambulatory Visit: Payer: PRIVATE HEALTH INSURANCE | Admitting: Vascular Surgery

## 2014-03-12 ENCOUNTER — Other Ambulatory Visit: Payer: Self-pay

## 2014-03-12 DIAGNOSIS — I739 Peripheral vascular disease, unspecified: Secondary | ICD-10-CM

## 2014-03-12 DIAGNOSIS — Z48812 Encounter for surgical aftercare following surgery on the circulatory system: Secondary | ICD-10-CM

## 2014-03-13 ENCOUNTER — Telehealth: Payer: Self-pay | Admitting: Vascular Surgery

## 2014-03-13 NOTE — Telephone Encounter (Addendum)
Message copied by Gena Fray on Wed Mar 13, 2014  1:08 PM ------      Message from: Mena Goes      Created: Tue Mar 12, 2014 11:16 AM      Regarding: Schedule      Contact: [phone]                   ----- Message -----         From: Sherrye Payor, RN         Sent: 03/12/2014  11:02 AM           To: [facility] Charge Pool      Subject: needs appt. for vasc. studies and CEF                    This pt. requests appt. to discuss scheduling BP surgery on right leg. He cancelled his Sept. 2014 f/u appt.; he needs ABI's, and Left LE Art Duplex, with office appt. w/ Dr. Oneida Alar.  He will need at least 5 days notice for appt. to arrange transportation. Thanks. (I will place the vasc. Orders) ------  03/13/14: spoke with patient to schedule this appointment, dpm

## 2014-04-24 ENCOUNTER — Encounter: Payer: Self-pay | Admitting: Vascular Surgery

## 2014-04-25 ENCOUNTER — Ambulatory Visit (INDEPENDENT_AMBULATORY_CARE_PROVIDER_SITE_OTHER)
Admission: RE | Admit: 2014-04-25 | Discharge: 2014-04-25 | Disposition: A | Discharge status: Home / Self Care | Payer: PRIVATE HEALTH INSURANCE | Source: Ambulatory Visit | Attending: Vascular Surgery | Admitting: Vascular Surgery

## 2014-04-25 ENCOUNTER — Ambulatory Visit (HOSPITAL_COMMUNITY)
Admission: RE | Admit: 2014-04-25 | Discharge: 2014-04-25 | Disposition: A | Discharge status: Home / Self Care | Payer: PRIVATE HEALTH INSURANCE | Source: Ambulatory Visit | Attending: Vascular Surgery | Admitting: Vascular Surgery

## 2014-04-25 ENCOUNTER — Ambulatory Visit (INDEPENDENT_AMBULATORY_CARE_PROVIDER_SITE_OTHER): Payer: PRIVATE HEALTH INSURANCE | Admitting: Vascular Surgery

## 2014-04-25 ENCOUNTER — Encounter: Payer: Self-pay | Admitting: Vascular Surgery

## 2014-04-25 VITALS — BP 149/94 | HR 105 | Resp 16 | Ht 73.0 in | Wt 188.0 lb

## 2014-04-25 DIAGNOSIS — I739 Peripheral vascular disease, unspecified: Secondary | ICD-10-CM

## 2014-04-25 DIAGNOSIS — Z48812 Encounter for surgical aftercare following surgery on the circulatory system: Secondary | ICD-10-CM

## 2014-04-25 DIAGNOSIS — I70219 Atherosclerosis of native arteries of extremities with intermittent claudication, unspecified extremity: Secondary | ICD-10-CM

## 2014-04-25 NOTE — Progress Notes (Signed)
Patient is a 53 year old male who is status post left femoral to above-knee popliteal bypass with propaten on December 31. A vein patch was placed proximally and distally. He had poor quality saphenous vein. He reports that his left leg feels improved. He does not really describe claudication symptoms in the left leg at this point. He has no incisional drainage. He is walking without any pain in the left leg. He still experiences some claudication symptoms in the right leg. This occurs at about 1/2 mile. Unfortunately he continues to smoke approximately one pack of cigarettes per day. Greater than 3 minutes they were spent regarding smoking cessation counseling.  Review of systems: He denies shortness of breath. He denies chest pain.  Physical exam:    Filed Vitals:   04/25/14 1233  BP: 149/94  Pulse: 105  Resp: 16  Height: 6\' 1"  (1.854 m)  Weight: 188 lb (85.276 kg)    Neck: No carotid bruits  Lower extremities: 2+ femoral absent right popliteal and pedal pulses, well-healed incisions left leg no palpable pedal pulses left foot but warm known to have primarily peroneal runoff  Data: Patient had bilateral ABIs today which were 1.04 on the left 0.69 on the right duplex of his left lower extremity bypass graft is widely patent with no increased velocities  Assessment: Patent left femoral popliteal bypass. Claudication right lower extremity stable.  Tobacco abuse  Plan: The patient will continue walking 30 minutes daily. He will try to quit smoking. He will followup in October to consider whether or not he wishes to have a right femoropopliteal bypass performed.  Ruta Hinds, MD [facility] [place] Office: [phone] Pager: [phone]

## 2014-05-28 DIAGNOSIS — E7800 Pure hypercholesterolemia, unspecified: Secondary | ICD-10-CM | POA: Insufficient documentation

## 2014-07-10 ENCOUNTER — Encounter: Payer: Self-pay | Admitting: Vascular Surgery

## 2014-07-11 ENCOUNTER — Ambulatory Visit (INDEPENDENT_AMBULATORY_CARE_PROVIDER_SITE_OTHER): Payer: PRIVATE HEALTH INSURANCE | Admitting: Vascular Surgery

## 2014-07-11 ENCOUNTER — Encounter: Payer: Self-pay | Admitting: Vascular Surgery

## 2014-07-11 ENCOUNTER — Other Ambulatory Visit: Payer: Self-pay

## 2014-07-11 VITALS — BP 130/87 | HR 81 | Ht 73.0 in | Wt 193.0 lb

## 2014-07-11 DIAGNOSIS — Z01810 Encounter for preprocedural cardiovascular examination: Secondary | ICD-10-CM

## 2014-07-11 DIAGNOSIS — I739 Peripheral vascular disease, unspecified: Secondary | ICD-10-CM

## 2014-07-11 NOTE — Addendum Note (Signed)
Addended by: Mena Goes on: 07/11/2014 03:40 PM   Modules accepted: Orders

## 2014-07-11 NOTE — Progress Notes (Signed)
Patient is a 53 year old male who is status post left femoral to above-knee popliteal bypass with propaten on December 31. A vein patch was placed proximally and distally. He had poor quality saphenous vein. He reports that his left leg feels improved. He does not really describe claudication symptoms in the left leg at this point.  He is walking without any pain in the left leg. He still experiences some claudication symptoms in the right leg. This occurs after 35-40 steps. He has to stop about 5 times in the course of a half-mile. Unfortunately he continues to smoke approximately one pack of cigarettes per day. Greater than 3 minutes they were spent regarding smoking cessation counseling.  Review of systems: He denies shortness of breath. He denies chest pain.  Past Medical History  Diagnosis Date  . Hypertension   . Hyperlipidemia   . Peripheral vascular disease   . Stroke     2008  . Tobacco abuse   . S/P cardiac cath November 2013  . Chronic kidney disease     moderate    History   Social History  . Marital Status: Married    Spouse Name: N/A    Number of Children: N/A  . Years of Education: N/A   Occupational History  . Not on file.   Social History Main Topics  . Smoking status: Current Some Day Smoker -- 0.20 packs/day for 25 years    Types: Cigarettes  . Smokeless tobacco: Never Used     Comment: pt states that he does not smoke daily and only smokes when he can get them  . Alcohol Use: No  . Drug Use: Yes    Special: Marijuana  . Sexual Activity: Not Currently   Other Topics Concern  . Not on file   Social History Narrative  . No narrative on file   Past Surgical History  Procedure Laterality Date  . Stents in bil legs    . Colonoscopy    . Femoral artery - popliteal artery bypass graft    . Femoral-popliteal bypass graft  09/26/2012    Procedure: BYPASS GRAFT FEMORAL-POPLITEAL ARTERY;  Surgeon: Elam Dutch, MD;  Location: [facility] OR;  Service: Vascular;   Laterality: Left;  using 34mm Gore-Tex Propaten Ringed Graft, Vein patch on the proximal and distal anastomosis  . Pr vein bypass graft,aorto-fem-pop    . Cardiac catheterization       Physical exam:    Filed Vitals:   07/11/14 1154  BP: 130/87  Pulse: 81  Height: 6\' 1"  (1.854 m)  Weight: 193 lb (87.544 kg)  SpO2: 97%    Neck: No carotid bruits Chest: Clear to auscultation bilaterally  Cardiac: Regular rate and rhythm no murmur  Abdomen: Soft nontender  Lower extremities: 2+ femoral absent right popliteal and pedal pulses, well-healed incisions left leg no palpable pedal pulses left foot but warm known to have primarily peroneal runoff  Data: Patient had bilateral ABIs July 2015 which were 1.04 on the left 0.69 on the right duplex of his left lower extremity bypass graft is widely patent with no increased velocities  Assessment: Patent left femoral popliteal bypass. Claudication right lower extremity stable.  Tobacco abuse  Plan: The patient will continue walking 30 minutes daily. He will try to quit smoking.   We will schedule him for cardiac risk stratification near future. He is scheduled for lower extremity arteriogram on October 23. His femoropopliteal bypass will be the following week.  Ruta Hinds, MD [facility] [place] Office: [phone] Pager: [phone]

## 2014-07-12 ENCOUNTER — Ambulatory Visit: Payer: PRIVATE HEALTH INSURANCE | Admitting: Physician Assistant

## 2014-07-22 ENCOUNTER — Encounter (HOSPITAL_COMMUNITY): Payer: Self-pay | Admitting: Pharmacy Technician

## 2014-07-22 ENCOUNTER — Encounter: Payer: Self-pay | Admitting: Physician Assistant

## 2014-07-22 ENCOUNTER — Encounter: Payer: PRIVATE HEALTH INSURANCE | Admitting: Physician Assistant

## 2014-07-22 DIAGNOSIS — N182 Chronic kidney disease, stage 2 (mild): Secondary | ICD-10-CM | POA: Insufficient documentation

## 2014-07-22 DIAGNOSIS — Z720 Tobacco use: Secondary | ICD-10-CM | POA: Insufficient documentation

## 2014-07-22 DIAGNOSIS — I2510 Atherosclerotic heart disease of native coronary artery without angina pectoris: Secondary | ICD-10-CM | POA: Insufficient documentation

## 2014-07-22 NOTE — Progress Notes (Deleted)
[address] [place], [place]  [address] Phone: [phone] Fax:  [phone]  Date:  07/22/2014   Patient ID:  Todd [name], Todd [name] December 24, 1960, MRN [id number]   PCP:  [facility]  Cardiologist:  Dr. Elease Hashimoto   ***refresh  History of Present Illness: Todd [name] is a 53 y.o. male with history of PVD as below, HTN, HLD, CAD by cath 11/13 (following abnormal nuc, not requiring PCI), prior stroke, tobacco abuse who presents for pre-op evaluation. He has history of LE stenting and more recently left femoral to above-knee popliteal bypass on 09/26/13. He recently presented to Dr. Darrick Penna' office for follow-up describing right lower extremity claudication. Bilateral ABIs in July 2015 were 1.04 on the left 0.69 on the right; duplex of his left lower extremity bypass graft was widely patent with no increased velocities. Per notes, Dr. Darrick Penna recommended LE arteriogram which is to take place on 07/26/14 and then likely femoropopliteal bypass which is scheduled for 07/29/14.***   Recent Labs: No results found for requested labs within last 365 days.  Wt Readings from Last 3 Encounters:  07/11/14 193 lb (87.544 kg)  04/25/14 188 lb (85.276 kg)  04/16/13 177 lb (80.287 kg)     Past Medical History  Diagnosis Date  . Hypertension   . Hyperlipidemia   . Peripheral vascular disease     a. bilateral superficial femoral artery stents in May/August of 2012. b. Left femoral to above-knee popliteal bypass in 08/2012.  . Stroke     2008  . Tobacco abuse   . CAD (coronary artery disease)     a. Cath 07/2012 following abnormal nuclear stress test: 20-30% LAD, 20-30% OM1, occluded branch off OM2 that fills slowly via collaterals, small nondominant RCA occluded proximally, fills slowly via collaterals.  . CKD (chronic kidney disease), stage II   . Tobacco abuse     Current Outpatient Prescriptions  Medication Sig Dispense Refill  . aspirin EC 81 MG tablet Take 81 mg by  mouth daily.      . cyclobenzaprine (FLEXERIL) 5 MG tablet Take 1 tablet (5 mg total) by mouth 3 (three) times daily as needed for muscle spasms.  15 tablet  0  . hydrochlorothiazide (HYDRODIURIL) 25 MG tablet Take 25 mg by mouth daily.      Marland Kitchen HYDROcodone-acetaminophen (NORCO/VICODIN) 5-325 MG per tablet Take 1 tablet by mouth every 4 (four) hours as needed for pain.  15 tablet  0  . rosuvastatin (CRESTOR) 10 MG tablet Take 10 mg by mouth at bedtime.       . sildenafil (VIAGRA) 50 MG tablet Take 50 mg by mouth daily as needed for erectile dysfunction.       No current facility-administered medications for this visit.    Allergies:   Review of patient's allergies indicates no known allergies.   Social History:  The patient  reports that he has been smoking Cigarettes.  He has a 5 pack-year smoking history. He has never used smokeless tobacco. He reports that he uses illicit drugs (Marijuana). He reports that he does not drink alcohol.   Family History:  The patient's family history includes Arthritis in his father; Cancer in his father and mother; Deep vein thrombosis in his mother; Diabetes in his mother; Heart attack in his mother; Heart disease in his mother; Hyperlipidemia in his mother; Hypertension in his mother; Peripheral vascular disease in his mother; Stroke in his mother. ***  ROS:  Please see the  history of present illness.   ***   All other systems reviewed and negative.   PHYSICAL EXAM: *** VS:  There were no vitals taken for this visit. Well nourished, well developed, in no acute distress HEENT: normal Neck: no JVD Cardiac:  normal S1, S2; RRR; no murmur Lungs:  clear to auscultation bilaterally, no wheezing, rhonchi or rales Abd: soft, nontender, no hepatomegaly Ext: no edema Skin: warm and dry Neuro:  moves all extremities spontaneously, no focal abnormalities noted  EKG:  ***     ASSESSMENT AND PLAN:  1. ***PVD 2. CAD 3. HTN 4. Hyperlipidemia 5. Tobacco  abuse  Dispo: F/u***  Signed, Ronie Spies, PA-C  07/22/2014 1:29 PM

## 2014-07-22 NOTE — Progress Notes (Signed)
This encounter was created in error - please disregard.

## 2014-07-22 NOTE — Pre-Procedure Instructions (Signed)
Todd [name]  07/22/2014   Your procedure is scheduled on:  Monday, Nov. 2nd   Report to [facility] Admitting at 5:30 AM.   Call this number if you have problems the morning of surgery: [phone]   Remember:   Do not eat food or drink liquids after midnight Sunday.   Take these medicines the morning of surgery with A SIP OF WATER:  Pain medication as needed.   Do not wear jewelry, no rings or watches.  Do not wear lotions or colognes.  You may NOT wear deodorant the morning of surgery.   Men may shave face and neck.   Do not bring valuables to the hospital.  [facility] is not responsible for any belongings or valuables.               Contacts, dentures or bridgework may not be worn into surgery.  Leave suitcase in the car. After surgery it may be brought to your room.  For patients admitted to the hospital, discharge time is determined by your treatment team.              Name and phone number of your driver:    Special Instructions: "Preparing for Surgery" instruction sheet.   Please read over the following fact sheets that you were given: Pain Booklet, Blood Transfusion Information, MRSA Information and Surgical Site Infection Prevention

## 2014-07-23 ENCOUNTER — Encounter (HOSPITAL_COMMUNITY)
Admission: RE | Admit: 2014-07-23 | Discharge: 2014-07-23 | Disposition: A | Discharge status: Home / Self Care | Payer: PRIVATE HEALTH INSURANCE | Source: Ambulatory Visit | Attending: Vascular Surgery | Admitting: Vascular Surgery

## 2014-07-23 NOTE — Progress Notes (Signed)
Patient wanted surgery date changed until after the 11/14.  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  I called [facility] and let him speak with OR scheduler.  We clarified his aortagram time and place. And [facility] Janus will call him for pre-admit appt.  DA

## 2014-07-25 DIAGNOSIS — Z5309 Procedure and treatment not carried out because of other contraindication: Secondary | ICD-10-CM | POA: Diagnosis present

## 2014-07-25 MED ORDER — DEXTROSE 5 % IV SOLN
1.5000 g | INTRAVENOUS | Status: DC
  Filled 2014-07-25: qty 1.5

## 2014-07-25 MED ORDER — SODIUM CHLORIDE 0.9 % IV SOLN: INTRAVENOUS | Status: DC

## 2014-07-26 ENCOUNTER — Ambulatory Visit (HOSPITAL_COMMUNITY)
Admission: RE | Admit: 2014-07-26 | Discharge: 2014-07-26 | Disposition: A | Discharge status: Home / Self Care | Payer: PRIVATE HEALTH INSURANCE | Source: Ambulatory Visit | Attending: Vascular Surgery | Admitting: Vascular Surgery

## 2014-07-26 ENCOUNTER — Encounter (HOSPITAL_COMMUNITY): Payer: Self-pay | Admitting: Pharmacy Technician

## 2014-07-26 ENCOUNTER — Other Ambulatory Visit: Payer: Self-pay

## 2014-07-26 ENCOUNTER — Encounter (HOSPITAL_COMMUNITY)
Admission: RE | Disposition: A | Discharge status: Home / Self Care | Payer: Self-pay | Source: Ambulatory Visit | Attending: Vascular Surgery

## 2014-07-26 ENCOUNTER — Other Ambulatory Visit: Payer: PRIVATE HEALTH INSURANCE

## 2014-07-26 DIAGNOSIS — Z8673 Personal history of transient ischemic attack (TIA), and cerebral infarction without residual deficits: Secondary | ICD-10-CM | POA: Diagnosis not present

## 2014-07-26 DIAGNOSIS — F17210 Nicotine dependence, cigarettes, uncomplicated: Secondary | ICD-10-CM | POA: Diagnosis not present

## 2014-07-26 DIAGNOSIS — I2510 Atherosclerotic heart disease of native coronary artery without angina pectoris: Secondary | ICD-10-CM | POA: Diagnosis not present

## 2014-07-26 DIAGNOSIS — I739 Peripheral vascular disease, unspecified: Secondary | ICD-10-CM | POA: Diagnosis present

## 2014-07-26 DIAGNOSIS — Z5309 Procedure and treatment not carried out because of other contraindication: Secondary | ICD-10-CM | POA: Insufficient documentation

## 2014-07-26 DIAGNOSIS — I70211 Atherosclerosis of native arteries of extremities with intermittent claudication, right leg: Secondary | ICD-10-CM | POA: Diagnosis not present

## 2014-07-26 DIAGNOSIS — E785 Hyperlipidemia, unspecified: Secondary | ICD-10-CM | POA: Diagnosis not present

## 2014-07-26 DIAGNOSIS — Z23 Encounter for immunization: Secondary | ICD-10-CM | POA: Diagnosis not present

## 2014-07-26 DIAGNOSIS — F329 Major depressive disorder, single episode, unspecified: Secondary | ICD-10-CM | POA: Diagnosis not present

## 2014-07-26 DIAGNOSIS — I129 Hypertensive chronic kidney disease with stage 1 through stage 4 chronic kidney disease, or unspecified chronic kidney disease: Secondary | ICD-10-CM | POA: Diagnosis not present

## 2014-07-26 DIAGNOSIS — N182 Chronic kidney disease, stage 2 (mild): Secondary | ICD-10-CM | POA: Diagnosis not present

## 2014-07-26 SURGERY — ABDOMINAL AORTAGRAM
Anesthesia: LOCAL

## 2014-07-26 NOTE — Progress Notes (Signed)
Pt showed up today for agram but was not NPO.  This is an elective procedure.  We will reschedule him.  Ruta Hinds, MD [facility] Office: [phone] Pager: [phone]

## 2014-07-26 NOTE — Progress Notes (Signed)
Pt has had coffee with cream and sugar 2.5 hrs ago, he does not have a ride home except public transport and has no one to stay the night with him at home. Anderson Malta RN cath lab was called and Dr Oneida Alar cancelled the case. Pt informed that the office will call him at home to reschedule. Pt verbalized understanding.

## 2014-08-06 ENCOUNTER — Encounter: Payer: Self-pay | Admitting: Physician Assistant

## 2014-08-06 ENCOUNTER — Ambulatory Visit (INDEPENDENT_AMBULATORY_CARE_PROVIDER_SITE_OTHER): Payer: PRIVATE HEALTH INSURANCE | Admitting: Physician Assistant

## 2014-08-06 VITALS — BP 116/84 | HR 75 | Ht 73.0 in | Wt 187.0 lb

## 2014-08-06 DIAGNOSIS — I10 Essential (primary) hypertension: Secondary | ICD-10-CM

## 2014-08-06 DIAGNOSIS — Z01810 Encounter for preprocedural cardiovascular examination: Secondary | ICD-10-CM

## 2014-08-06 DIAGNOSIS — I739 Peripheral vascular disease, unspecified: Secondary | ICD-10-CM

## 2014-08-06 DIAGNOSIS — Z720 Tobacco use: Secondary | ICD-10-CM

## 2014-08-06 DIAGNOSIS — I2510 Atherosclerotic heart disease of native coronary artery without angina pectoris: Secondary | ICD-10-CM

## 2014-08-06 DIAGNOSIS — N182 Chronic kidney disease, stage 2 (mild): Secondary | ICD-10-CM

## 2014-08-06 DIAGNOSIS — E785 Hyperlipidemia, unspecified: Secondary | ICD-10-CM

## 2014-08-06 MED ORDER — METOPROLOL SUCCINATE ER 25 MG PO TB24: 25.0000 mg | ORAL_TABLET | Freq: Every day | ORAL | Status: DC

## 2014-08-06 NOTE — Progress Notes (Signed)
[address] [place], [place]  [address] Phone: [phone] Fax:  [phone]  Date:  08/06/2014   Patient ID:  Todd [name], Todd [name] 08-19-1961, MRN [id number]   PCP:  [facility]  Cardiologist:  Dr. Elease Hashimoto  History of Present Illness: Todd [name] is a 53 y.o. male with history of PVD as below, HTN, HLD, CAD by cath 11/13 (see below - following abnormal nuc, occluded branch off OM2 & small nondominant occluded RCA - likely accounting for nuc abnormalities, not requiring PCI), prior stroke, tobacco abuse, CKD stage II by historical data who presents for pre-op evaluation. He has history of LE stenting and more recently left femoral to above-knee popliteal bypass on 09/26/13. He recently presented to Dr. Darrick Penna' office for follow-up describing right lower extremity claudication. Bilateral ABIs in July 2015 were 1.04 on the left 0.69. Per notes, Dr. Darrick Penna recommended LE arteriogram which is to take place on 08/09/14/15 and then possibly femoropopliteal bypass which is scheduled for 08/12/14.  The patient comes in feeling well. He is still smoking 1/2 ppd. His activity is limited by his RLE claudication but he still gets around. He denies any chest pain, dyspnea, LEE, orthopnea, PND, nausea, bleeding or syncope.   Recent Labs: No results found for requested labs within last 365 days.  Wt Readings from Last 3 Encounters:  08/06/14 187 lb (84.823 kg)  07/26/14 187 lb (84.823 kg)  07/23/14 192 lb 14.4 oz (87.499 kg)     Past Medical History  Diagnosis Date  . Hypertension   . Hyperlipidemia   . Peripheral vascular disease     a. bilateral superficial femoral artery stents in May/August of 2012. b. Left femoral to above-knee popliteal bypass in 08/2012.  . Stroke     2008  . Tobacco abuse   . CAD (coronary artery disease)     a. Cath 07/2012 following abnormal nuclear stress test: 20-30% LAD, 20-30% OM1, occluded branch off OM2 that fills slowly via  collaterals, small nondominant RCA occluded proximally, fills slowly via collaterals.  . CKD (chronic kidney disease), stage II     Current Outpatient Prescriptions  Medication Sig Dispense Refill  . aspirin EC 81 MG tablet Take 81 mg by mouth daily.    . hydrochlorothiazide (HYDRODIURIL) 25 MG tablet Take 25 mg by mouth daily.    . rosuvastatin (CRESTOR) 10 MG tablet Take 10 mg by mouth at bedtime.     . valACYclovir (VALTREX) 500 MG tablet Take 500 mg by mouth at bedtime.     No current facility-administered medications for this visit.    Allergies:   Review of patient's allergies indicates no known allergies.   Social History:  The patient  reports that he has been smoking Cigarettes.  He has a 12.5 pack-year smoking history. He has never used smokeless tobacco. He reports that he uses illicit drugs (Marijuana). He reports that he does not drink alcohol.   Family History:  The patient's family history includes Arthritis in his father; Cancer in his father and mother; Deep vein thrombosis in his mother; Diabetes in his mother; Heart attack in his mother; Heart disease in his mother; Hyperlipidemia in his mother; Hypertension in his mother; Peripheral vascular disease in his mother; Stroke in his mother. ROS:  Please see the history of present illness.  All other systems reviewed and negative.   PHYSICAL EXAM:  VS:  BP 116/84 mmHg  Pulse 75  Ht 6\' 1"  (1.854 m)  Wt 187 lb (84.823 kg)  BMI 24.68 kg/m2 Well nourished, well developed, in no acute distress HEENT: normal Neck: no JVD, no carotid bruits. Cardiac:  normal S1, S2; RRR; no murmur Lungs:  clear to auscultation bilaterally, no wheezing, rhonchi or rales Abd: soft, nontender, no hepatomegaly Ext: no edema Skin: warm and dry Neuro:  moves all extremities spontaneously, no focal abnormalities noted  EKG:  NSR 75bpm, LVH, nonspecific ST-T changes including inferior TWI as well as V5-V6, J point elevation in V5. These changes  are accentuated from 2013, similar to 06/2014.  ASSESSMENT AND PLAN:  1. Peripheral vascular disease as above 2. Coronary artery disease (diagnosed following abnormal nuclear stress test) 3. HTN 4. Hyperlipidemia 5. Tobacco abuse  6. CKD stage II  I discussed the patient's case with Dr. Elease Hashimoto given his previously abnormal nuclear stress test and CAD. He has not had any anginal symptoms but unfortunately his claudication has limited his activity and his EKG is somewhat different. At this time we are recommending an updated Lexiscan nuclear stress test. We anticipate that if there are changes similar to prior study that are stable then he will be cleared for surgery. However, if there are new significant defects or LV dysfunction, he will require cardiac cath prior to proceeding with surgery.  However, the situation is complicated by the fact that he lives in [place] and requires a 5 day notice to his transportation service to be able to get here. We were able expedite scheduling of the nuclear stress test in [place] on Friday 08/09/14. Unfortunately, this is the same day he was due to have his LE angiogram. I spoke with Dr. Myra Gianotti tonight who informed me that Dr. Darrick Penna will be available tomorrow to speak with. I have made a note to myself to page him tomorrow.  Tobacco cessation was strongly advised. We have discontinued HCTZ and initiated Toprol XL given his history of CAD. This may also help reduce risk of CIN given his CKD.  Dispo: F/u in 6 months - sooner if nuc abnormal. He relies heavily on pre-arranged transportation. He lives in [place]. He would like to follow up there if possible. We have requested he become established with a cardiologist there for his next 6 month follow-up.   Signed, Ronie Spies, PA-C  08/06/2014 4:52 PM

## 2014-08-06 NOTE — Patient Instructions (Signed)
Your physician has recommended you make the following change in your medication:   STOP TAKING FLUID PILL HYDROCHLOROTHIAZIDE  START TOPROLOL XL 25 MG ONCE A DAY     Your physician has requested that you have a lexiscan myoview BEFORE 08/12/14 IN [place] . For further information please visit [email or web address]. Please follow instruction sheet, as given.    Your physician wants you to follow-up in:  IN [facility]  You will receive a reminder letter in the mail two months in advance. If you don't receive a letter, please call our office to schedule the follow-up appointment.

## 2014-08-07 ENCOUNTER — Other Ambulatory Visit: Payer: Self-pay | Admitting: *Deleted

## 2014-08-07 ENCOUNTER — Telehealth: Payer: Self-pay | Admitting: Physician Assistant

## 2014-08-07 DIAGNOSIS — I2510 Atherosclerotic heart disease of native coronary artery without angina pectoris: Secondary | ICD-10-CM

## 2014-08-07 NOTE — Telephone Encounter (Signed)
NEW PROBLEM:   Pt called to cancel his 08/09/14 CATH.  Pt would like a call back to reschedul this.

## 2014-08-07 NOTE — Telephone Encounter (Signed)
I spoke with cath lab and confirmed pt is scheduled for LE angiogram by Dr. Oneida Alar on August 09, 2014. I spoke with pt and he needs to cancel procedure due to transportation issues. I told him to contact Dr. Nona Dell office regarding this procedure. He will contact them at 9:00 when office opens.  Pt states he has no further questions for our office.

## 2014-08-07 NOTE — Telephone Encounter (Signed)
I called Dr. Oneida Alar' office and spoke with Colletta Maryland his nurse. She is currently in communication with our office to try to move stress test up, and to arrange transportation for him. Dayna Dunn PA-C

## 2014-08-08 ENCOUNTER — Encounter (HOSPITAL_COMMUNITY): Payer: Self-pay

## 2014-08-08 ENCOUNTER — Encounter (HOSPITAL_COMMUNITY)
Admission: RE | Admit: 2014-08-08 | Discharge: 2014-08-08 | Disposition: A | Discharge status: Home / Self Care | Payer: PRIVATE HEALTH INSURANCE | Source: Ambulatory Visit | Attending: Physician Assistant | Admitting: Physician Assistant

## 2014-08-08 ENCOUNTER — Ambulatory Visit (HOSPITAL_COMMUNITY)
Admission: RE | Admit: 2014-08-08 | Discharge: 2014-08-08 | Disposition: A | Discharge status: Home / Self Care | Payer: PRIVATE HEALTH INSURANCE | Source: Ambulatory Visit | Attending: Physician Assistant | Admitting: Physician Assistant

## 2014-08-08 ENCOUNTER — Telehealth: Payer: Self-pay | Admitting: Physician Assistant

## 2014-08-08 DIAGNOSIS — Z01810 Encounter for preprocedural cardiovascular examination: Secondary | ICD-10-CM

## 2014-08-08 DIAGNOSIS — R079 Chest pain, unspecified: Secondary | ICD-10-CM

## 2014-08-08 DIAGNOSIS — I2510 Atherosclerotic heart disease of native coronary artery without angina pectoris: Secondary | ICD-10-CM

## 2014-08-08 DIAGNOSIS — I70211 Atherosclerosis of native arteries of extremities with intermittent claudication, right leg: Secondary | ICD-10-CM | POA: Diagnosis not present

## 2014-08-08 HISTORY — DX: Disorder of kidney and ureter, unspecified: N28.9

## 2014-08-08 MED ORDER — REGADENOSON 0.4 MG/5ML IV SOLN
INTRAVENOUS | Status: AC
  Administered 2014-08-08: 0.4 mg via INTRAVENOUS
  Filled 2014-08-08: qty 5

## 2014-08-08 MED ORDER — SODIUM CHLORIDE 0.9 % IV SOLN
INTRAVENOUS | Status: DC
  Administered 2014-08-09: 08:00:00 via INTRAVENOUS

## 2014-08-08 MED ORDER — SODIUM CHLORIDE 0.9 % IJ SOLN
10.0000 mL | INTRAMUSCULAR | Status: DC | PRN
  Administered 2014-08-08: 10 mL via INTRAVENOUS
  Filled 2014-08-08: qty 10

## 2014-08-08 MED ORDER — SODIUM CHLORIDE 0.9 % IJ SOLN
INTRAMUSCULAR | Status: AC
  Administered 2014-08-08: 10 mL via INTRAVENOUS
  Filled 2014-08-08: qty 10

## 2014-08-08 MED ORDER — TECHNETIUM TC 99M SESTAMIBI GENERIC - CARDIOLITE
30.0000 | Freq: Once | INTRAVENOUS | Status: AC | PRN
  Administered 2014-08-08: 30 via INTRAVENOUS

## 2014-08-08 MED ORDER — TECHNETIUM TC 99M SESTAMIBI - CARDIOLITE
10.0000 | Freq: Once | INTRAVENOUS | Status: AC | PRN
  Administered 2014-08-08: 07:00:00 9 via INTRAVENOUS

## 2014-08-08 MED ORDER — REGADENOSON 0.4 MG/5ML IV SOLN
0.4000 mg | Freq: Once | INTRAVENOUS | Status: AC | PRN
  Administered 2014-08-08: 0.4 mg via INTRAVENOUS

## 2014-08-08 MED ORDER — SODIUM CHLORIDE 0.9 % IJ SOLN
INTRAMUSCULAR | Status: AC
  Filled 2014-08-08: qty 36

## 2014-08-08 NOTE — Telephone Encounter (Signed)
Received nuclear stress test results. There is a small mild-mod intensity inferolateral defect partially reversible c/w ischemia, EF 61%, felt to be a low risk study. I discussed these results with Dr. Acie Fredrickson who feels this result is consistent with prior and consistent with his known anatomy. He is asymptomatic from a cardiac standpoint. We feel he is cleared for surgery.  I called Dr. Oneida Alar' nurse Colletta Maryland to let her know of the results. From our perspective he is OK to proceed with LE angiogram tomorrow as well as surgery on Monday as previously planned. I also called the patient to let him know.  Todd Gruetzmacher PA-C

## 2014-08-08 NOTE — Progress Notes (Signed)
Stress Lab Nurses Notes - [facility] Na  SHLOME BALDREE 08/08/2014 Reason for doing test: Chest Pain and Surgical Clearance Type of test: Wille Glaser Nurse performing test: Gerrit Halls, RN Nuclear Medicine Tech: Redmond Baseman Echo Tech: Not Applicable MD performing test: S. McDowell/K.Purcell Nails NP Family MD: [facility] Test explained and consent signed: Yes.   IV started: Saline lock flushed, No redness or edema and Saline lock started in radiology Symptoms: "funny taste in mouth" Treatment/Intervention: None Reason test stopped: protocol completed After recovery IV was: Discontinued via X-ray tech and No redness or edema Patient to return to Nuc. Med at : 9:30 Patient discharged: Home Patient's Condition upon discharge was: stable Comments: During test BP 133/83 & HR 106.  Recovery BP 117/79 & HR 88.  Symptoms resolved in recovery. Geanie Cooley T

## 2014-08-09 ENCOUNTER — Encounter (HOSPITAL_COMMUNITY)
Admission: RE | Disposition: A | Discharge status: Home / Self Care | Payer: PRIVATE HEALTH INSURANCE | Source: Ambulatory Visit | Attending: Vascular Surgery

## 2014-08-09 ENCOUNTER — Inpatient Hospital Stay (HOSPITAL_COMMUNITY): Admission: RE | Admit: 2014-08-09 | Payer: PRIVATE HEALTH INSURANCE | Source: Ambulatory Visit

## 2014-08-09 ENCOUNTER — Observation Stay (HOSPITAL_COMMUNITY)
Admission: RE | Admit: 2014-08-09 | Discharge: 2014-08-10 | Disposition: A | Discharge status: Home / Self Care | Payer: PRIVATE HEALTH INSURANCE | Source: Ambulatory Visit | Attending: Vascular Surgery | Admitting: Vascular Surgery

## 2014-08-09 ENCOUNTER — Encounter (HOSPITAL_COMMUNITY): Payer: Self-pay | Admitting: *Deleted

## 2014-08-09 DIAGNOSIS — I70211 Atherosclerosis of native arteries of extremities with intermittent claudication, right leg: Principal | ICD-10-CM | POA: Insufficient documentation

## 2014-08-09 DIAGNOSIS — E785 Hyperlipidemia, unspecified: Secondary | ICD-10-CM | POA: Insufficient documentation

## 2014-08-09 DIAGNOSIS — I129 Hypertensive chronic kidney disease with stage 1 through stage 4 chronic kidney disease, or unspecified chronic kidney disease: Secondary | ICD-10-CM | POA: Insufficient documentation

## 2014-08-09 DIAGNOSIS — F17210 Nicotine dependence, cigarettes, uncomplicated: Secondary | ICD-10-CM | POA: Insufficient documentation

## 2014-08-09 DIAGNOSIS — I70213 Atherosclerosis of native arteries of extremities with intermittent claudication, bilateral legs: Secondary | ICD-10-CM

## 2014-08-09 DIAGNOSIS — F329 Major depressive disorder, single episode, unspecified: Secondary | ICD-10-CM | POA: Insufficient documentation

## 2014-08-09 DIAGNOSIS — I2510 Atherosclerotic heart disease of native coronary artery without angina pectoris: Secondary | ICD-10-CM | POA: Insufficient documentation

## 2014-08-09 DIAGNOSIS — N182 Chronic kidney disease, stage 2 (mild): Secondary | ICD-10-CM | POA: Insufficient documentation

## 2014-08-09 DIAGNOSIS — I739 Peripheral vascular disease, unspecified: Secondary | ICD-10-CM | POA: Diagnosis present

## 2014-08-09 DIAGNOSIS — Z8673 Personal history of transient ischemic attack (TIA), and cerebral infarction without residual deficits: Secondary | ICD-10-CM | POA: Insufficient documentation

## 2014-08-09 DIAGNOSIS — Z23 Encounter for immunization: Secondary | ICD-10-CM | POA: Insufficient documentation

## 2014-08-09 HISTORY — PX: ABDOMINAL AORTAGRAM: SHX5454

## 2014-08-09 LAB — PROTIME-INR
INR: 1.05 (ref 0.00–1.49)
Prothrombin Time: 13.8 seconds (ref 11.6–15.2)

## 2014-08-09 LAB — URINALYSIS, ROUTINE W REFLEX MICROSCOPIC
Bilirubin Urine: NEGATIVE
Glucose, UA: NEGATIVE mg/dL
Ketones, ur: NEGATIVE mg/dL
Leukocytes, UA: NEGATIVE
NITRITE: NEGATIVE
Protein, ur: NEGATIVE mg/dL
SPECIFIC GRAVITY, URINE: 1.01 (ref 1.005–1.030)
UROBILINOGEN UA: 1 mg/dL (ref 0.0–1.0)
pH: 7.5 (ref 5.0–8.0)

## 2014-08-09 LAB — POCT I-STAT, CHEM 8
BUN: 7 mg/dL (ref 6–23)
CALCIUM ION: 1.2 mmol/L (ref 1.12–1.23)
Chloride: 102 mEq/L (ref 96–112)
Creatinine, Ser: 1.3 mg/dL (ref 0.50–1.35)
GLUCOSE: 93 mg/dL (ref 70–99)
HEMATOCRIT: 45 % (ref 39.0–52.0)
HEMOGLOBIN: 15.3 g/dL (ref 13.0–17.0)
Potassium: 3.9 mEq/L (ref 3.7–5.3)
Sodium: 140 mEq/L (ref 137–147)
TCO2: 26 mmol/L (ref 0–100)

## 2014-08-09 LAB — COMPREHENSIVE METABOLIC PANEL
ALK PHOS: 57 U/L (ref 39–117)
ALT: 13 U/L (ref 0–53)
ANION GAP: 12 (ref 5–15)
AST: 19 U/L (ref 0–37)
Albumin: 3.2 g/dL — ABNORMAL LOW (ref 3.5–5.2)
BUN: 12 mg/dL (ref 6–23)
CO2: 24 mEq/L (ref 19–32)
CREATININE: 1.22 mg/dL (ref 0.50–1.35)
Calcium: 9.2 mg/dL (ref 8.4–10.5)
Chloride: 101 mEq/L (ref 96–112)
GFR calc non Af Amer: 67 mL/min — ABNORMAL LOW (ref >90)
GFR, EST AFRICAN AMERICAN: 78 mL/min — AB (ref >90)
GLUCOSE: 94 mg/dL (ref 70–99)
Potassium: 4 mEq/L (ref 3.7–5.3)
Sodium: 137 mEq/L (ref 137–147)
Total Bilirubin: 0.3 mg/dL (ref 0.3–1.2)
Total Protein: 6.5 g/dL (ref 6.0–8.3)

## 2014-08-09 LAB — CBC
HCT: 39.9 % (ref 39.0–52.0)
HEMOGLOBIN: 13.5 g/dL (ref 13.0–17.0)
MCH: 30.1 pg (ref 26.0–34.0)
MCHC: 33.8 g/dL (ref 30.0–36.0)
MCV: 88.9 fL (ref 78.0–100.0)
Platelets: 202 10*3/uL (ref 150–400)
RBC: 4.49 MIL/uL (ref 4.22–5.81)
RDW: 14 % (ref 11.5–15.5)
WBC: 9.7 10*3/uL (ref 4.0–10.5)

## 2014-08-09 LAB — URINE MICROSCOPIC-ADD ON

## 2014-08-09 LAB — APTT: aPTT: 33 seconds (ref 24–37)

## 2014-08-09 LAB — TYPE AND SCREEN
ABO/RH(D): A POS
ANTIBODY SCREEN: NEGATIVE

## 2014-08-09 SURGERY — ABDOMINAL AORTAGRAM
Anesthesia: LOCAL

## 2014-08-09 MED ORDER — LABETALOL HCL 5 MG/ML IV SOLN
10.0000 mg | INTRAVENOUS | Status: DC | PRN
Start: 2014-08-09 — End: 2014-08-10
  Filled 2014-08-09: qty 4

## 2014-08-09 MED ORDER — PANTOPRAZOLE SODIUM 40 MG PO TBEC
40.0000 mg | DELAYED_RELEASE_TABLET | Freq: Every day | ORAL | Status: DC
  Administered 2014-08-09: 40 mg via ORAL
  Filled 2014-08-09: qty 1

## 2014-08-09 MED ORDER — INFLUENZA VAC SPLIT QUAD 0.5 ML IM SUSY
0.5000 mL | PREFILLED_SYRINGE | INTRAMUSCULAR | Status: AC
  Administered 2014-08-10: 0.5 mL via INTRAMUSCULAR
  Filled 2014-08-09: qty 0.5

## 2014-08-09 MED ORDER — METOPROLOL TARTRATE 1 MG/ML IV SOLN: 2.0000 mg | INTRAVENOUS | Status: DC | PRN

## 2014-08-09 MED ORDER — VALACYCLOVIR HCL 500 MG PO TABS
500.0000 mg | ORAL_TABLET | Freq: Every day | ORAL | Status: DC
  Filled 2014-08-09 (×4): qty 1

## 2014-08-09 MED ORDER — HYDRALAZINE HCL 20 MG/ML IJ SOLN: 5.0000 mg | INTRAMUSCULAR | Status: DC | PRN

## 2014-08-09 MED ORDER — ROSUVASTATIN CALCIUM 10 MG PO TABS
10.0000 mg | ORAL_TABLET | Freq: Every day | ORAL | Status: DC
  Administered 2014-08-09: 10 mg via ORAL
  Filled 2014-08-09 (×3): qty 1

## 2014-08-09 MED ORDER — ONDANSETRON HCL 4 MG/2ML IJ SOLN: 4.0000 mg | Freq: Four times a day (QID) | INTRAMUSCULAR | Status: DC | PRN

## 2014-08-09 MED ORDER — PNEUMOCOCCAL VAC POLYVALENT 25 MCG/0.5ML IJ INJ
0.5000 mL | INJECTION | INTRAMUSCULAR | Status: AC
  Administered 2014-08-10: 0.5 mL via INTRAMUSCULAR
  Filled 2014-08-09 (×2): qty 0.5

## 2014-08-09 MED ORDER — POTASSIUM CHLORIDE CRYS ER 20 MEQ PO TBCR: 20.0000 meq | EXTENDED_RELEASE_TABLET | Freq: Every day | ORAL | Status: DC | PRN

## 2014-08-09 MED ORDER — ACETAMINOPHEN 325 MG PO TABS: 325.0000 mg | ORAL_TABLET | ORAL | Status: DC | PRN

## 2014-08-09 MED ORDER — MORPHINE SULFATE 2 MG/ML IJ SOLN: 2.0000 mg | INTRAMUSCULAR | Status: DC | PRN

## 2014-08-09 MED ORDER — METOPROLOL SUCCINATE ER 25 MG PO TB24
25.0000 mg | ORAL_TABLET | Freq: Every day | ORAL | Status: DC
  Administered 2014-08-10: 25 mg via ORAL
  Filled 2014-08-09: qty 1

## 2014-08-09 MED ORDER — POTASSIUM CHLORIDE CRYS ER 20 MEQ PO TBCR: 20.0000 meq | EXTENDED_RELEASE_TABLET | Freq: Once | ORAL | Status: DC

## 2014-08-09 MED ORDER — DEXTROSE-NACL 5-0.45 % IV SOLN
INTRAVENOUS | Status: AC
  Administered 2014-08-09: 11:00:00 via INTRAVENOUS

## 2014-08-09 MED ORDER — OXYCODONE-ACETAMINOPHEN 5-325 MG PO TABS: 1.0000 | ORAL_TABLET | ORAL | Status: DC | PRN

## 2014-08-09 MED ORDER — ACETAMINOPHEN 650 MG RE SUPP: 325.0000 mg | RECTAL | Status: DC | PRN

## 2014-08-09 MED ORDER — LABETALOL HCL 5 MG/ML IV SOLN: 10.0000 mg | INTRAVENOUS | Status: DC | PRN

## 2014-08-09 MED ORDER — ALUM & MAG HYDROXIDE-SIMETH 200-200-20 MG/5ML PO SUSP: 15.0000 mL | ORAL | Status: DC | PRN

## 2014-08-09 MED ORDER — ASPIRIN EC 81 MG PO TBEC
81.0000 mg | DELAYED_RELEASE_TABLET | Freq: Every day | ORAL | Status: DC
  Administered 2014-08-10: 81 mg via ORAL
  Filled 2014-08-09: qty 1

## 2014-08-09 NOTE — Op Note (Signed)
Procedure: Aortogram with bilateral lower extremity runoff  Preoperative diagnosis: Claudication Postoperative diagnosis: Same Anesthesia: Local  Operative details: After obtaining informed consent, the patient was brought to the [facility] lab. The patient was placed in supine position the Angio table. Both groins were prepped and draped in usual sterile fashion. Local anesthesia was obtained of the right common femoral artery. Ultrasound was used to identify the right common femoral artery. The right common femoral artery was cannulated with an introducer needle using ultrasound guidance. An 035 versacore wire was threaded up in the abdominal aorta without difficulty. A 5 French sheath was placed over the guidewire into the right common femoral artery. This was thoroughly flushed with heparinized saline. A 5 French pig catheter was then placed over the guidewire into the abdominal aorta. Abdominal aortogram was obtained in AP projection. The right renal artery has a 70% stenosis. The left renal artery is widely patent. The infrarenal abdominal aorta is tortuous but patent. The left and right common and external iliac arteries are patent. The left internal iliac artery is occluded. The right internal iliac artery is occluded but with several segments of high-grade stenosis.  Next the pigtail catheter was pulled down just above the aortic bifurcation and oblique views of the pelvis were obtained to confirm the above findings. Lower extremity runoff views were then obtained through the pigtail catheter.  In the left lower extremity, the left common femoral artery is patent. The left superficial femoral artery has an extensive stent which is occluded. There is a bypass graft originating from the left common femoral artery with its distal anastomosis to the above-knee popliteal artery. This is widely patent. The popliteal artery is patent. There is three-vessel runoff to the left foot.  The dominant runoff vessel is  the anterior tibial to dorsalis pedis artery.  In the right lower extremity,  The right common femoral artery is patent. The right superficial femoral artery is occluded. There is additionally a lengthy stent extending from the origin of the superficial femoral artery all the way down to the knee joint. The below-knee popliteal artery is patent. There is three-vessel runoff to the right foot although again the anterior tibial and dorsalis pedis artery is the primary runoff vessel.  The picture catheter was then removed over a guidewire. The 5 French sheath was thoroughly flushed with heparinized saline. The patient was taken to the holding area in stable condition for removal of the sheath in the holding area. The patient tolerated procedure well and there were no complications.  Operative findings: #1 patent left femoral to above-knee popliteal bypass with three-vessel runoff #2 chronic occlusion right superficial femoral artery with reconstitution of the right below-knee popliteal artery and three-vessel runoff primarily via the anterior tibial artery  Operative management: The patient is scheduled for a right femoral to below-knee popliteal bypass on Monday, 08/12/2014.  Ruta Hinds, MD [facility] Office: [phone] Pager: [phone]

## 2014-08-09 NOTE — Progress Notes (Signed)
Site area: RFASite Prior to Removal:  Level 0 Pressure Applied For:27min Manual:    Patient Status During Pull: stable  Post Pull Site:  Level Post Pull Instructions Given:  yes Post Pull Pulses Present: doppler Dressing Applied:  clear Bedrest begins @ 6789 Comments: no complications

## 2014-08-09 NOTE — Interval H&P Note (Signed)
History and Physical Interval Note:  08/09/2014 9:48 AM  Todd [name]  has presented today for surgery, with the diagnosis of pvd with right leg claudication  The various methods of treatment have been discussed with the patient and family. After consideration of risks, benefits and other options for treatment, the patient has consented to  Procedure(s): ABDOMINAL AORTAGRAM (N/A) as a surgical intervention .  The patient's history has been reviewed, patient examined, no change in status, stable for surgery.  I have reviewed the patient's chart and labs.  Questions were answered to the patient's satisfaction.     Jovita Persing E

## 2014-08-09 NOTE — Progress Notes (Addendum)
Pt had an abdominal aortagram done today, he is admitted for a 23 hr observation on [facility]. I called and spoke with Todd Look, RN for Todd [name]. She is going to get the labs done today that are ordered for his surgery on Monday.  Called pt (in hospital room) and did pre-op call. He states Todd [name] will be transporting him, he states it's been set up and he should be here by 5:30 AM.

## 2014-08-09 NOTE — H&P (View-Only) (Signed)
Patient is a 53 year old male who is status post left femoral to above-knee popliteal bypass with propaten on December 31. A vein patch was placed proximally and distally. He had poor quality saphenous vein. He reports that his left leg feels improved. He does not really describe claudication symptoms in the left leg at this point.  He is walking without any pain in the left leg. He still experiences some claudication symptoms in the right leg. This occurs after 35-40 steps. He has to stop about 5 times in the course of a half-mile. Unfortunately he continues to smoke approximately one pack of cigarettes per day. Greater than 3 minutes they were spent regarding smoking cessation counseling.  Review of systems: He denies shortness of breath. He denies chest pain.  Past Medical History  Diagnosis Date  . Hypertension   . Hyperlipidemia   . Peripheral vascular disease   . Stroke     2008  . Tobacco abuse   . S/P cardiac cath November 2013  . Chronic kidney disease     moderate    History   Social History  . Marital Status: Married    Spouse Name: N/A    Number of Children: N/A  . Years of Education: N/A   Occupational History  . Not on file.   Social History Main Topics  . Smoking status: Current Some Day Smoker -- 0.20 packs/day for 25 years    Types: Cigarettes  . Smokeless tobacco: Never Used     Comment: pt states that he does not smoke daily and only smokes when he can get them  . Alcohol Use: No  . Drug Use: Yes    Special: Marijuana  . Sexual Activity: Not Currently   Other Topics Concern  . Not on file   Social History Narrative  . No narrative on file   Past Surgical History  Procedure Laterality Date  . Stents in bil legs    . Colonoscopy    . Femoral artery - popliteal artery bypass graft    . Femoral-popliteal bypass graft  09/26/2012    Procedure: BYPASS GRAFT FEMORAL-POPLITEAL ARTERY;  Surgeon: Elam Dutch, MD;  Location[facility];  Service: Vascular;   Laterality: Left;  using 21mm Gore-Tex Propaten Ringed Graft, Vein patch on the proximal and distal anastomosis  . Pr vein bypass graft,aorto-fem-pop    . Cardiac catheterization       Physical exam:    Filed Vitals:   07/11/14 1154  BP: 130/87  Pulse: 81  Height: 6\' 1"  (1.854 m)  Weight: 193 lb (87.544 kg)  SpO2: 97%    Neck: No carotid bruits Chest: Clear to auscultation bilaterally  Cardiac: Regular rate and rhythm no murmur  Abdomen: Soft nontender  Lower extremities: 2+ femoral absent right popliteal and pedal pulses, well-healed incisions left leg no palpable pedal pulses left foot but warm known to have primarily peroneal runoff  Data: Patient had bilateral ABIs July 2015 which were 1.04 on the left 0.69 on the right duplex of his left lower extremity bypass graft is widely patent with no increased velocities  Assessment: Patent left femoral popliteal bypass. Claudication right lower extremity stable.  Tobacco abuse  Plan: The patient will continue walking 30 minutes daily. He will try to quit smoking.   We will schedule him for cardiac risk stratification near future. He is scheduled for lower extremity arteriogram on October 23. His femoropopliteal bypass will be the following week.  Ruta Hinds, MD [facility] Office: [phone] Pager: [phone]

## 2014-08-09 NOTE — Interval H&P Note (Signed)
History and Physical Interval Note:  08/09/2014 7:45 AM  [place] Cellar  has presented today for surgery, with the diagnosis of pvd with right leg claudication  The various methods of treatment have been discussed with the patient and family. After consideration of risks, benefits and other options for treatment, the patient has consented to  Procedure(s): ABDOMINAL AORTAGRAM (N/A) as a surgical intervention .  The patient's history has been reviewed, patient examined, no change in status, stable for surgery.  I have reviewed the patient's chart and labs.  Questions were answered to the patient's satisfaction.     Jahir Halt E

## 2014-08-10 ENCOUNTER — Encounter (HOSPITAL_COMMUNITY): Payer: Self-pay

## 2014-08-10 DIAGNOSIS — I70211 Atherosclerosis of native arteries of extremities with intermittent claudication, right leg: Secondary | ICD-10-CM | POA: Diagnosis not present

## 2014-08-10 NOTE — Progress Notes (Signed)
Nutrition Brief Note  Patient identified on the Malnutrition Screening Tool (MST) Report  Wt Readings from Last 15 Encounters:  08/09/14 187 lb (84.823 kg)  08/06/14 187 lb (84.823 kg)  07/26/14 187 lb (84.823 kg)  07/23/14 192 lb 14.4 oz (87.499 kg)  07/11/14 193 lb (87.544 kg)  04/25/14 188 lb (85.276 kg)  04/16/13 177 lb (80.287 kg)  01/25/13 176 lb 6.4 oz (80.015 kg)  10/12/12 189 lb (85.73 kg)  09/28/12 252 lb (114.306 kg)  09/15/12 187 lb 13.3 oz (85.2 kg)  09/07/12 184 lb (83.462 kg)  08/28/12 190 lb 12.8 oz (86.546 kg)  08/11/12 190 lb (86.183 kg)  08/07/12 189 lb (85.73 kg)    Body mass index is 24.68 kg/(m^2). Patient meets criteria for noraml based on current BMI. Weight hx stable overall (180-190# range).  Pt has good appetite.Current diet order is Heart Healthy, patient is consuming approximately 100% of meals at this time. Labs and medications reviewed.   No nutrition interventions warranted at this time. If nutrition issues arise, please consult RD.   Colman Cater MS,RD,CSG,LDN Office: [phone] Pager: [phone]

## 2014-08-10 NOTE — Progress Notes (Signed)
Pt given discharge instructions, medication lists, follow up appointments, and when to call the doctor.  Pt verbalizes understanding. Pt to return Monday 08/12/2014 for surgery. Payton Emerald, RN

## 2014-08-10 NOTE — Progress Notes (Addendum)
   [facility]  Subjective  - Doing well.   Objective 129/65 68 97.8 F (36.6 C) (Oral) 18 100%  Intake/Output Summary (Last 24 hours) at 08/10/14 0754 Last data filed at 08/09/14 1145  Gross per 24 hour  Intake    240 ml  Output      0 ml  Net    240 ml    Right groin soft without hematoma Angiogram:  The right superficial femoral artery is occluded. There is additionally a lengthy stent extending from the origin of the superficial femoral artery all the way down to the knee joint. The below-knee popliteal artery is patent. There is three-vessel runoff to the right foot although again the anterior tibial and dorsalis pedis artery is the primary runoff vessel.  Assessment/Planning: POD #1angiogram   The patient is scheduled for a right femoral to below-knee popliteal bypass on Monday, 08/12/2014. Discharge home this am  [facility] 08/10/2014 7:54 AM -- Agree with above  D/c Mooreton, MD [facility] [place]: [phone] Pager: [phone]  Laboratory Lab Results:  Recent Labs  08/09/14 0820 08/09/14 1937  WBC  --  9.7  HGB 15.3 13.5  HCT 45.0 39.9  PLT  --  202   BMET  Recent Labs  08/09/14 0820 08/09/14 1937  NA 140 137  K 3.9 4.0  CL 102 101  CO2  --  24  GLUCOSE 93 94  BUN 7 12  CREATININE 1.30 1.22  CALCIUM  --  9.2    COAG Lab Results  Component Value Date   INR 1.05 08/09/2014   INR 1.04 08/08/2012   No results found for: PTT

## 2014-08-12 ENCOUNTER — Inpatient Hospital Stay (HOSPITAL_COMMUNITY): Payer: PRIVATE HEALTH INSURANCE | Admitting: Anesthesiology

## 2014-08-12 ENCOUNTER — Encounter (HOSPITAL_COMMUNITY)
Admission: RE | Disposition: A | Discharge status: Home / Self Care | Payer: Self-pay | Source: Ambulatory Visit | Attending: Vascular Surgery

## 2014-08-12 ENCOUNTER — Encounter (HOSPITAL_COMMUNITY): Payer: Self-pay | Admitting: Anesthesiology

## 2014-08-12 ENCOUNTER — Inpatient Hospital Stay (HOSPITAL_COMMUNITY): Payer: PRIVATE HEALTH INSURANCE

## 2014-08-12 ENCOUNTER — Inpatient Hospital Stay (HOSPITAL_COMMUNITY)
Admission: RE | Admit: 2014-08-12 | Discharge: 2014-08-13 | DRG: 254 | Disposition: A | Discharge status: Home / Self Care | Payer: PRIVATE HEALTH INSURANCE | Source: Ambulatory Visit | Attending: Vascular Surgery | Admitting: Vascular Surgery

## 2014-08-12 DIAGNOSIS — I70213 Atherosclerosis of native arteries of extremities with intermittent claudication, bilateral legs: Secondary | ICD-10-CM

## 2014-08-12 DIAGNOSIS — I739 Peripheral vascular disease, unspecified: Secondary | ICD-10-CM | POA: Diagnosis present

## 2014-08-12 DIAGNOSIS — N182 Chronic kidney disease, stage 2 (mild): Secondary | ICD-10-CM | POA: Diagnosis present

## 2014-08-12 DIAGNOSIS — Z419 Encounter for procedure for purposes other than remedying health state, unspecified: Secondary | ICD-10-CM

## 2014-08-12 DIAGNOSIS — I2510 Atherosclerotic heart disease of native coronary artery without angina pectoris: Secondary | ICD-10-CM | POA: Diagnosis present

## 2014-08-12 DIAGNOSIS — I129 Hypertensive chronic kidney disease with stage 1 through stage 4 chronic kidney disease, or unspecified chronic kidney disease: Secondary | ICD-10-CM | POA: Diagnosis present

## 2014-08-12 DIAGNOSIS — I70211 Atherosclerosis of native arteries of extremities with intermittent claudication, right leg: Secondary | ICD-10-CM | POA: Diagnosis present

## 2014-08-12 DIAGNOSIS — E785 Hyperlipidemia, unspecified: Secondary | ICD-10-CM | POA: Diagnosis present

## 2014-08-12 HISTORY — PX: FEMORAL-POPLITEAL BYPASS GRAFT: SHX937

## 2014-08-12 HISTORY — PX: INTRAOPERATIVE ARTERIOGRAM: SHX5157

## 2014-08-12 LAB — CREATININE, SERUM
CREATININE: 1.23 mg/dL (ref 0.50–1.35)
GFR calc Af Amer: 77 mL/min — ABNORMAL LOW (ref >90)
GFR, EST NON AFRICAN AMERICAN: 66 mL/min — AB (ref >90)

## 2014-08-12 LAB — CBC
HCT: 43.7 % (ref 39.0–52.0)
HEMOGLOBIN: 14.7 g/dL (ref 13.0–17.0)
MCH: 30.8 pg (ref 26.0–34.0)
MCHC: 33.6 g/dL (ref 30.0–36.0)
MCV: 91.4 fL (ref 78.0–100.0)
Platelets: 189 10*3/uL (ref 150–400)
RBC: 4.78 MIL/uL (ref 4.22–5.81)
RDW: 14 % (ref 11.5–15.5)
WBC: 13.4 10*3/uL — ABNORMAL HIGH (ref 4.0–10.5)

## 2014-08-12 LAB — SURGICAL PCR SCREEN
MRSA, PCR: NEGATIVE
STAPHYLOCOCCUS AUREUS: POSITIVE — AB

## 2014-08-12 SURGERY — BYPASS GRAFT FEMORAL-POPLITEAL ARTERY
Anesthesia: Monitor Anesthesia Care | Site: Leg Upper | Laterality: Right

## 2014-08-12 MED ORDER — ACETAMINOPHEN 650 MG RE SUPP: 325.0000 mg | RECTAL | Status: DC | PRN

## 2014-08-12 MED ORDER — MORPHINE SULFATE 2 MG/ML IJ SOLN
2.0000 mg | INTRAMUSCULAR | Status: DC | PRN
  Administered 2014-08-12: 2 mg via INTRAVENOUS
  Filled 2014-08-12: qty 1

## 2014-08-12 MED ORDER — GLYCOPYRROLATE 0.2 MG/ML IJ SOLN
INTRAMUSCULAR | Status: DC | PRN
  Administered 2014-08-12: 0.6 mg via INTRAVENOUS

## 2014-08-12 MED ORDER — THROMBIN 20000 UNITS EX SOLR
CUTANEOUS | Status: AC
  Filled 2014-08-12: qty 20000

## 2014-08-12 MED ORDER — STERILE WATER FOR INJECTION IJ SOLN
INTRAMUSCULAR | Status: AC
  Filled 2014-08-12: qty 10

## 2014-08-12 MED ORDER — ONDANSETRON HCL 4 MG/2ML IJ SOLN: 4.0000 mg | Freq: Four times a day (QID) | INTRAMUSCULAR | Status: DC | PRN

## 2014-08-12 MED ORDER — DEXTROSE 5 % IV SOLN
1.5000 g | Freq: Two times a day (BID) | INTRAVENOUS | Status: AC
  Administered 2014-08-12 – 2014-08-13 (×2): 1.5 g via INTRAVENOUS
  Filled 2014-08-12 (×2): qty 1.5

## 2014-08-12 MED ORDER — PROTAMINE SULFATE 10 MG/ML IV SOLN
INTRAVENOUS | Status: DC | PRN
  Administered 2014-08-12: 10 mg via INTRAVENOUS
  Administered 2014-08-12: 20 mg via INTRAVENOUS
  Administered 2014-08-12 (×2): 10 mg via INTRAVENOUS

## 2014-08-12 MED ORDER — ALUM & MAG HYDROXIDE-SIMETH 200-200-20 MG/5ML PO SUSP: 15.0000 mL | ORAL | Status: DC | PRN

## 2014-08-12 MED ORDER — HEPARIN SODIUM (PORCINE) 1000 UNIT/ML IJ SOLN
INTRAMUSCULAR | Status: AC
  Filled 2014-08-12: qty 1

## 2014-08-12 MED ORDER — OXYCODONE HCL 5 MG PO TABS
5.0000 mg | ORAL_TABLET | Freq: Four times a day (QID) | ORAL | Status: DC | PRN
  Administered 2014-08-12 – 2014-08-13 (×2): 10 mg via ORAL
  Filled 2014-08-12 (×2): qty 2

## 2014-08-12 MED ORDER — FENTANYL CITRATE 0.05 MG/ML IJ SOLN
INTRAMUSCULAR | Status: DC | PRN
  Administered 2014-08-12: 50 ug via INTRAVENOUS
  Administered 2014-08-12: 100 ug via INTRAVENOUS
  Administered 2014-08-12: 50 ug via INTRAVENOUS
  Administered 2014-08-12: 100 ug via INTRAVENOUS
  Administered 2014-08-12: 25 ug via INTRAVENOUS
  Administered 2014-08-12: 50 ug via INTRAVENOUS
  Administered 2014-08-12: 25 ug via INTRAVENOUS

## 2014-08-12 MED ORDER — NEOSTIGMINE METHYLSULFATE 10 MG/10ML IV SOLN
INTRAVENOUS | Status: AC
  Filled 2014-08-12: qty 1

## 2014-08-12 MED ORDER — HEPARIN SODIUM (PORCINE) 5000 UNIT/ML IJ SOLN
INTRAMUSCULAR | Status: DC | PRN
  Administered 2014-08-12: 500 mL

## 2014-08-12 MED ORDER — HYDROMORPHONE HCL 1 MG/ML IJ SOLN: 0.2500 mg | INTRAMUSCULAR | Status: DC | PRN

## 2014-08-12 MED ORDER — GUAIFENESIN-DM 100-10 MG/5ML PO SYRP: 15.0000 mL | ORAL_SOLUTION | ORAL | Status: DC | PRN

## 2014-08-12 MED ORDER — SODIUM CHLORIDE 0.9 % IV SOLN: 500.0000 mL | Freq: Once | INTRAVENOUS | Status: AC | PRN

## 2014-08-12 MED ORDER — MUPIROCIN 2 % EX OINT: 1.0000 "application " | TOPICAL_OINTMENT | Freq: Once | CUTANEOUS | Status: DC

## 2014-08-12 MED ORDER — ROCURONIUM BROMIDE 50 MG/5ML IV SOLN
INTRAVENOUS | Status: AC
  Filled 2014-08-12: qty 1

## 2014-08-12 MED ORDER — PANTOPRAZOLE SODIUM 40 MG PO TBEC
40.0000 mg | DELAYED_RELEASE_TABLET | Freq: Every day | ORAL | Status: DC
  Administered 2014-08-12 – 2014-08-13 (×2): 40 mg via ORAL
  Filled 2014-08-12 (×2): qty 1

## 2014-08-12 MED ORDER — DOCUSATE SODIUM 100 MG PO CAPS
100.0000 mg | ORAL_CAPSULE | Freq: Every day | ORAL | Status: DC
  Administered 2014-08-13: 100 mg via ORAL
  Filled 2014-08-12: qty 1

## 2014-08-12 MED ORDER — EPHEDRINE SULFATE 50 MG/ML IJ SOLN
INTRAMUSCULAR | Status: AC
  Filled 2014-08-12: qty 1

## 2014-08-12 MED ORDER — CHLORHEXIDINE GLUCONATE CLOTH 2 % EX PADS: 6.0000 | MEDICATED_PAD | Freq: Once | CUTANEOUS | Status: DC

## 2014-08-12 MED ORDER — SUCCINYLCHOLINE CHLORIDE 20 MG/ML IJ SOLN
INTRAMUSCULAR | Status: AC
  Filled 2014-08-12: qty 1

## 2014-08-12 MED ORDER — LACTATED RINGERS IV SOLN
INTRAVENOUS | Status: DC | PRN
  Administered 2014-08-12 (×3): via INTRAVENOUS

## 2014-08-12 MED ORDER — ROCURONIUM BROMIDE 100 MG/10ML IV SOLN
INTRAVENOUS | Status: DC | PRN
  Administered 2014-08-12: 50 mg via INTRAVENOUS

## 2014-08-12 MED ORDER — CEFUROXIME SODIUM 1.5 G IJ SOLR
INTRAMUSCULAR | Status: AC
  Administered 2014-08-12: 1.5 g via INTRAVENOUS
  Filled 2014-08-12: qty 1.5

## 2014-08-12 MED ORDER — ALBUTEROL SULFATE HFA 108 (90 BASE) MCG/ACT IN AERS
INHALATION_SPRAY | RESPIRATORY_TRACT | Status: AC
  Filled 2014-08-12: qty 6.7

## 2014-08-12 MED ORDER — FENTANYL CITRATE 0.05 MG/ML IJ SOLN
INTRAMUSCULAR | Status: AC
  Filled 2014-08-12: qty 5

## 2014-08-12 MED ORDER — VALACYCLOVIR HCL 500 MG PO TABS
500.0000 mg | ORAL_TABLET | Freq: Every day | ORAL | Status: DC
  Administered 2014-08-12: 500 mg via ORAL
  Filled 2014-08-12 (×2): qty 1

## 2014-08-12 MED ORDER — MIDAZOLAM HCL 5 MG/5ML IJ SOLN
INTRAMUSCULAR | Status: DC | PRN
  Administered 2014-08-12: 2 mg via INTRAVENOUS

## 2014-08-12 MED ORDER — ROSUVASTATIN CALCIUM 10 MG PO TABS
10.0000 mg | ORAL_TABLET | Freq: Every day | ORAL | Status: DC
  Administered 2014-08-12: 10 mg via ORAL
  Filled 2014-08-12 (×2): qty 1

## 2014-08-12 MED ORDER — METOPROLOL SUCCINATE ER 25 MG PO TB24
25.0000 mg | ORAL_TABLET | Freq: Every day | ORAL | Status: DC
  Administered 2014-08-13: 25 mg via ORAL
  Filled 2014-08-12: qty 1

## 2014-08-12 MED ORDER — ACETAMINOPHEN 325 MG PO TABS
325.0000 mg | ORAL_TABLET | ORAL | Status: DC | PRN
  Administered 2014-08-12: 650 mg via ORAL
  Filled 2014-08-12: qty 2

## 2014-08-12 MED ORDER — DEXAMETHASONE SODIUM PHOSPHATE 4 MG/ML IJ SOLN
INTRAMUSCULAR | Status: DC | PRN
  Administered 2014-08-12: 4 mg via INTRAVENOUS

## 2014-08-12 MED ORDER — MUPIROCIN 2 % EX OINT
TOPICAL_OINTMENT | CUTANEOUS | Status: AC
  Filled 2014-08-12: qty 22

## 2014-08-12 MED ORDER — MIDAZOLAM HCL 2 MG/2ML IJ SOLN
INTRAMUSCULAR | Status: AC
  Filled 2014-08-12: qty 2

## 2014-08-12 MED ORDER — METOPROLOL SUCCINATE ER 25 MG PO TB24
25.0000 mg | ORAL_TABLET | Freq: Once | ORAL | Status: AC
  Administered 2014-08-12: 25 mg via ORAL
  Filled 2014-08-12: qty 1

## 2014-08-12 MED ORDER — IOHEXOL 300 MG/ML  SOLN
INTRAMUSCULAR | Status: DC | PRN
  Administered 2014-08-12: 25 mL via INTRAVENOUS

## 2014-08-12 MED ORDER — LIDOCAINE HCL (CARDIAC) 20 MG/ML IV SOLN
INTRAVENOUS | Status: AC
  Filled 2014-08-12: qty 15

## 2014-08-12 MED ORDER — SODIUM CHLORIDE 0.9 % IV SOLN
INTRAVENOUS | Status: DC
  Administered 2014-08-12: 20:00:00 via INTRAVENOUS

## 2014-08-12 MED ORDER — PHENOL 1.4 % MT LIQD: 1.0000 | OROMUCOSAL | Status: DC | PRN

## 2014-08-12 MED ORDER — HEPARIN SODIUM (PORCINE) 1000 UNIT/ML IJ SOLN
INTRAMUSCULAR | Status: DC | PRN
  Administered 2014-08-12: 10000 [IU] via INTRAVENOUS

## 2014-08-12 MED ORDER — PHENYLEPHRINE HCL 10 MG/ML IJ SOLN
INTRAMUSCULAR | Status: DC | PRN
  Administered 2014-08-12 (×2): 80 ug via INTRAVENOUS

## 2014-08-12 MED ORDER — ALBUTEROL SULFATE HFA 108 (90 BASE) MCG/ACT IN AERS
INHALATION_SPRAY | RESPIRATORY_TRACT | Status: DC | PRN
  Administered 2014-08-12: 4 via RESPIRATORY_TRACT

## 2014-08-12 MED ORDER — LABETALOL HCL 5 MG/ML IV SOLN: 10.0000 mg | INTRAVENOUS | Status: DC | PRN

## 2014-08-12 MED ORDER — ASPIRIN EC 81 MG PO TBEC
81.0000 mg | DELAYED_RELEASE_TABLET | Freq: Every day | ORAL | Status: DC
  Administered 2014-08-13: 81 mg via ORAL
  Filled 2014-08-12: qty 1

## 2014-08-12 MED ORDER — VECURONIUM BROMIDE 10 MG IV SOLR
INTRAVENOUS | Status: DC | PRN
  Administered 2014-08-12: 2 mg via INTRAVENOUS
  Administered 2014-08-12: 1 mg via INTRAVENOUS
  Administered 2014-08-12 (×2): 2 mg via INTRAVENOUS

## 2014-08-12 MED ORDER — POTASSIUM CHLORIDE CRYS ER 20 MEQ PO TBCR
20.0000 meq | EXTENDED_RELEASE_TABLET | Freq: Every day | ORAL | Status: DC | PRN
Start: 2014-08-12 — End: 2014-08-13

## 2014-08-12 MED ORDER — NEOSTIGMINE METHYLSULFATE 10 MG/10ML IV SOLN
INTRAVENOUS | Status: DC | PRN
  Administered 2014-08-12: 4 mg via INTRAVENOUS

## 2014-08-12 MED ORDER — HYDRALAZINE HCL 20 MG/ML IJ SOLN: 5.0000 mg | INTRAMUSCULAR | Status: DC | PRN

## 2014-08-12 MED ORDER — LIDOCAINE HCL (CARDIAC) 20 MG/ML IV SOLN
INTRAVENOUS | Status: DC | PRN
  Administered 2014-08-12: 100 mg via INTRAVENOUS

## 2014-08-12 MED ORDER — ONDANSETRON HCL 4 MG/2ML IJ SOLN: 4.0000 mg | Freq: Once | INTRAMUSCULAR | Status: DC | PRN

## 2014-08-12 MED ORDER — ENOXAPARIN SODIUM 30 MG/0.3ML ~~LOC~~ SOLN
30.0000 mg | SUBCUTANEOUS | Status: DC
  Filled 2014-08-12: qty 0.3

## 2014-08-12 MED ORDER — GLYCOPYRROLATE 0.2 MG/ML IJ SOLN
INTRAMUSCULAR | Status: AC
  Filled 2014-08-12: qty 3

## 2014-08-12 MED ORDER — ARTIFICIAL TEARS OP OINT
TOPICAL_OINTMENT | OPHTHALMIC | Status: AC
Start: 2014-08-12 — End: 2014-08-12
  Filled 2014-08-12: qty 3.5

## 2014-08-12 MED ORDER — DEXTROSE 5 % IV SOLN
1.5000 g | Freq: Once | INTRAVENOUS | Status: AC
  Administered 2014-08-12: 1.5 g via INTRAVENOUS
  Filled 2014-08-12: qty 1.5

## 2014-08-12 MED ORDER — METOPROLOL TARTRATE 1 MG/ML IV SOLN
2.0000 mg | INTRAVENOUS | Status: DC | PRN
Start: 2014-08-12 — End: 2014-08-13

## 2014-08-12 MED ORDER — PHENYLEPHRINE 40 MCG/ML (10ML) SYRINGE FOR IV PUSH (FOR BLOOD PRESSURE SUPPORT)
PREFILLED_SYRINGE | INTRAVENOUS | Status: AC
  Filled 2014-08-12: qty 10

## 2014-08-12 MED ORDER — PROPOFOL 10 MG/ML IV BOLUS
INTRAVENOUS | Status: DC | PRN
  Administered 2014-08-12: 150 mg via INTRAVENOUS
  Administered 2014-08-12 (×2): 50 mg via INTRAVENOUS

## 2014-08-12 MED ORDER — VECURONIUM BROMIDE 10 MG IV SOLR
INTRAVENOUS | Status: AC
  Filled 2014-08-12: qty 10

## 2014-08-12 MED ORDER — EPHEDRINE SULFATE 50 MG/ML IJ SOLN
INTRAMUSCULAR | Status: DC | PRN
  Administered 2014-08-12 (×4): 5 mg via INTRAVENOUS

## 2014-08-12 MED ORDER — 0.9 % SODIUM CHLORIDE (POUR BTL) OPTIME
TOPICAL | Status: DC | PRN
  Administered 2014-08-12: 2000 mL

## 2014-08-12 MED ORDER — PROPOFOL 10 MG/ML IV BOLUS
INTRAVENOUS | Status: AC
  Filled 2014-08-12: qty 20

## 2014-08-12 SURGICAL SUPPLY — 58 items
ADH SKN CLS APL DERMABOND .7 (GAUZE/BANDAGES/DRESSINGS) ×6
BANDAGE ESMARK 6X9 LF (GAUZE/BANDAGES/DRESSINGS) IMPLANT
BNDG CMPR 9X6 STRL LF SNTH (GAUZE/BANDAGES/DRESSINGS)
BNDG ESMARK 6X9 LF (GAUZE/BANDAGES/DRESSINGS)
CANISTER SUCTION 2500CC (MISCELLANEOUS) ×3 IMPLANT
CANNULA VESSEL W/WING WO/VALVE (CANNULA) ×4 IMPLANT
CLIP TI MEDIUM 24 (CLIP) ×3 IMPLANT
CLIP TI WIDE RED SMALL 24 (CLIP) ×3 IMPLANT
CUFF TOURNIQUET SINGLE 24IN (TOURNIQUET CUFF) IMPLANT
CUFF TOURNIQUET SINGLE 34IN LL (TOURNIQUET CUFF) IMPLANT
CUFF TOURNIQUET SINGLE 44IN (TOURNIQUET CUFF) IMPLANT
DERMABOND ADVANCED (GAUZE/BANDAGES/DRESSINGS) ×3
DERMABOND ADVANCED .7 DNX12 (GAUZE/BANDAGES/DRESSINGS) IMPLANT
DRAIN SNY WOU (WOUND CARE) IMPLANT
DRAPE X-RAY CASS 24X20 (DRAPES) ×2 IMPLANT
DRSG COVADERM 4X6 (GAUZE/BANDAGES/DRESSINGS) ×6 IMPLANT
DRSG COVADERM 4X8 (GAUZE/BANDAGES/DRESSINGS) ×2 IMPLANT
ELECT REM PT RETURN 9FT ADLT (ELECTROSURGICAL) ×3
ELECTRODE REM PT RTRN 9FT ADLT (ELECTROSURGICAL) ×2 IMPLANT
EVACUATOR SILICONE 100CC (DRAIN) IMPLANT
GAUZE SPONGE 4X4 16PLY XRAY LF (GAUZE/BANDAGES/DRESSINGS) ×2 IMPLANT
GLOVE BIO SURGEON STRL SZ 6.5 (GLOVE) ×10 IMPLANT
GLOVE BIO SURGEON STRL SZ7.5 (GLOVE) ×7 IMPLANT
GLOVE BIOGEL PI IND STRL 6.5 (GLOVE) IMPLANT
GLOVE BIOGEL PI IND STRL 8 (GLOVE) ×2 IMPLANT
GLOVE BIOGEL PI INDICATOR 6.5 (GLOVE) ×4
GLOVE BIOGEL PI INDICATOR 8 (GLOVE) ×2
GLOVE SURG SS PI 7.0 STRL IVOR (GLOVE) ×3 IMPLANT
GOWN STRL REUS W/ TWL LRG LVL3 (GOWN DISPOSABLE) ×10 IMPLANT
GOWN STRL REUS W/TWL LRG LVL3 (GOWN DISPOSABLE) ×21
KIT BASIN OR (CUSTOM PROCEDURE TRAY) ×3 IMPLANT
KIT ROOM TURNOVER OR (KITS) ×3 IMPLANT
NS IRRIG 1000ML POUR BTL (IV SOLUTION) ×6 IMPLANT
PACK PERIPHERAL VASCULAR (CUSTOM PROCEDURE TRAY) ×3 IMPLANT
PAD ARMBOARD 7.5X6 YLW CONV (MISCELLANEOUS) ×6 IMPLANT
PADDING CAST COTTON 6X4 STRL (CAST SUPPLIES) IMPLANT
SET COLLECT BLD 21X3/4 12 (NEEDLE) ×2 IMPLANT
SPONGE SURGIFOAM ABS GEL 100 (HEMOSTASIS) IMPLANT
STAPLER VISISTAT 35W (STAPLE) ×2 IMPLANT
STOPCOCK 4 WAY LG BORE MALE ST (IV SETS) ×1 IMPLANT
SUT PROLENE 5 0 C 1 24 (SUTURE) ×3 IMPLANT
SUT PROLENE 6 0 C 1 24 (SUTURE) ×1 IMPLANT
SUT PROLENE 6 0 CC (SUTURE) ×6 IMPLANT
SUT PROLENE 7 0 BV 1 (SUTURE) IMPLANT
SUT PROLENE 7 0 BV1 MDA (SUTURE) IMPLANT
SUT SILK 2 0 SH (SUTURE) ×3 IMPLANT
SUT SILK 3 0 (SUTURE) ×12
SUT SILK 3-0 18XBRD TIE 12 (SUTURE) ×4 IMPLANT
SUT VIC AB 2-0 CTX 36 (SUTURE) ×6 IMPLANT
SUT VIC AB 3-0 SH 27 (SUTURE) ×18
SUT VIC AB 3-0 SH 27X BRD (SUTURE) ×8 IMPLANT
SUT VIC AB 4-0 PS2 27 (SUTURE) ×10 IMPLANT
TAPE UMBILICAL COTTON 1/8X30 (MISCELLANEOUS) ×2 IMPLANT
TOWEL OR 17X24 6PK STRL BLUE (TOWEL DISPOSABLE) ×6 IMPLANT
TRAY FOLEY CATH 16FRSI W/METER (SET/KITS/TRAYS/PACK) ×3 IMPLANT
TUBING EXTENTION W/L.L. (IV SETS) ×1 IMPLANT
UNDERPAD 30X30 INCONTINENT (UNDERPADS AND DIAPERS) ×3 IMPLANT
WATER STERILE IRR 1000ML POUR (IV SOLUTION) ×3 IMPLANT

## 2014-08-12 NOTE — Anesthesia Procedure Notes (Signed)
Procedure Name: Intubation Date/Time: 08/12/2014 7:35 AM Performed by: Maryland Pink Pre-anesthesia Checklist: Patient identified, Emergency Drugs available, Suction available, Patient being monitored and Timeout performed Patient Re-evaluated:Patient Re-evaluated prior to inductionOxygen Delivery Method: Circle system utilized Preoxygenation: Pre-oxygenation with 100% oxygen Intubation Type: IV induction Ventilation: Mask ventilation without difficulty and Oral airway inserted - appropriate to patient size Laryngoscope Size: Mac and 4 Grade View: Grade I Tube type: Oral Tube size: 7.5 mm Number of attempts: 1 Airway Equipment and Method: Stylet and LTA kit utilized Placement Confirmation: ETT inserted through vocal cords under direct vision,  positive ETCO2 and breath sounds checked- equal and bilateral Secured at: 23 cm Tube secured with: Tape Dental Injury: Teeth and Oropharynx as per pre-operative assessment  Comments: Intubation by [facility] paramedic student Denyce Robert

## 2014-08-12 NOTE — Transfer of Care (Signed)
Immediate Anesthesia Transfer of Care Note  Patient: Todd [name]  Procedure(s) Performed: Procedure(s):  FEMORAL-POPLITEAL ARTERY BYPASS WITH SAPHENOUS VEIN GRAFT, INTRAOPERATIVE ARTERIOGRAM (Right) INTRA OPERATIVE ARTERIOGRAM  Patient Location: PACU  Anesthesia Type:General  Level of Consciousness: awake and alert   Airway & Oxygen Therapy: Patient Spontanous Breathing and Patient connected to nasal cannula oxygen  Post-op Assessment: Report given to PACU RN, Post -op Vital signs reviewed and stable and Patient moving all extremities X 4  Post vital signs: Reviewed and stable  Complications: No apparent anesthesia complications

## 2014-08-12 NOTE — Progress Notes (Signed)
  [facility] Progress Note  08/12/2014 5:52 PM Day of Surgery  Subjective:  Doing well. Mild right leg incisional pain. Complained of left heel "stinging."   Filed Vitals:   08/12/14 1648  BP: 143/102  Pulse: 79  Temp:   Resp: 25    Physical Exam: Incisions:  Right leg medial incisions with minimal sanguinous drainage.  Extremities:  Biphasic dorsalis pedis signals bilaterally. Left heel without erythema or wounds.   CBC    Component Value Date/Time   WBC 13.4* 08/12/2014 1610   RBC 4.78 08/12/2014 1610   HGB 14.7 08/12/2014 1610   HCT 43.7 08/12/2014 1610   PLT 189 08/12/2014 1610   MCV 91.4 08/12/2014 1610   MCH 30.8 08/12/2014 1610   MCHC 33.6 08/12/2014 1610   RDW 14.0 08/12/2014 1610   LYMPHSABS 3.0 08/08/2012 1640   MONOABS 0.4 08/08/2012 1640   EOSABS 0.1 08/08/2012 1640   BASOSABS 0.0 08/08/2012 1640    BMET    Component Value Date/Time   NA 137 08/09/2014 1937   K 4.0 08/09/2014 1937   CL 101 08/09/2014 1937   CO2 24 08/09/2014 1937   GLUCOSE 94 08/09/2014 1937   BUN 12 08/09/2014 1937   CREATININE 1.23 08/12/2014 1610   CREATININE 1.17 08/08/2012 1640   CALCIUM 9.2 08/09/2014 1937   GFRNONAA 66* 08/12/2014 1610   GFRAA 77* 08/12/2014 1610    INR    Component Value Date/Time   INR 1.05 08/09/2014 1937     Intake/Output Summary (Last 24 hours) at 08/12/14 1752 Last data filed at 08/12/14 1430  Gross per 24 hour  Intake   2600 ml  Output    895 ml  Net   1705 ml     Assessment:  53 y.o. male is s/p: Right femoral to below knee popliteal bypass with non-reversed ipsilateral great saphenous vein, intraop agram  Day of Surgery  Plan: -Doing well. Good doppler signals bilaterally. Left heel pain likely positional from surgery.  -OOB, mobilize. -D/C foley tomorrow. -Anticipate transfer to floor tomorrow.  -DVT prophylaxis:  lovenox   Virgina Jock, PA-C [facility] Office:  [phone] Pager: [phone] 08/12/2014 5:52 PM

## 2014-08-12 NOTE — Progress Notes (Signed)
Utilization review completed.  

## 2014-08-12 NOTE — Progress Notes (Signed)
Pt arrived to unit from PACU accompanied by RN. Pt oriented to room/unit. VS stable.

## 2014-08-12 NOTE — Interval H&P Note (Signed)
History and Physical Interval Note:  08/12/2014 7:13 AM  Todd [name]  has presented today for surgery, with the diagnosis of Peripheral Vascular Disease with Right Lower Extremity Claudication I70.211  The various methods of treatment have been discussed with the patient and family. After consideration of risks, benefits and other options for treatment, the patient has consented to  Procedure(s): BYPASS GRAFT FEMORAL-POPLITEAL ARTERY (Right) as a surgical intervention .  The patient's history has been reviewed, patient examined, no change in status, stable for surgery.  I have reviewed the patient's chart and labs.  Questions were answered to the patient's satisfaction.     Richard Ritchey E

## 2014-08-12 NOTE — Anesthesia Preprocedure Evaluation (Signed)
Anesthesia Evaluation  Patient identified by MRN, date of birth, ID band Patient awake    Reviewed: Allergy & Precautions, H&P , NPO status , Patient's Chart, lab work & pertinent test results  Airway        Dental   Pulmonary Current Smoker,          Cardiovascular hypertension, + CAD and + Peripheral Vascular Disease     Neuro/Psych Depression CVA    GI/Hepatic   Endo/Other    Renal/GU Dialysis and ESRFRenal disease     Musculoskeletal   Abdominal   Peds  Hematology   Anesthesia Other Findings   Reproductive/Obstetrics                             Anesthesia Physical Anesthesia Plan  ASA: III  Anesthesia Plan: General and MAC   Post-op Pain Management:    Induction: Intravenous  Airway Management Planned: LMA and Oral ETT  Additional Equipment:   Intra-op Plan:   Post-operative Plan: Extubation in OR  Informed Consent: I have reviewed the patients History and Physical, chart, labs and discussed the procedure including the risks, benefits and alternatives for the proposed anesthesia with the patient or authorized representative who has indicated his/her understanding and acceptance.     Plan Discussed with: Anesthesiologist, CRNA and Surgeon  Anesthesia Plan Comments:         Anesthesia Quick Evaluation

## 2014-08-12 NOTE — Anesthesia Postprocedure Evaluation (Signed)
  Anesthesia Post-op Note  Patient: Todd [name]  Procedure(s) Performed: Procedure(s):  FEMORAL-POPLITEAL ARTERY BYPASS WITH SAPHENOUS VEIN GRAFT, INTRAOPERATIVE ARTERIOGRAM (Right) INTRA OPERATIVE ARTERIOGRAM  Patient Location: PACU  Anesthesia Type:General  Level of Consciousness: awake, oriented, sedated and patient cooperative  Airway and Oxygen Therapy: Patient Spontanous Breathing  Post-op Pain: mild  Post-op Assessment: Post-op Vital signs reviewed, Patient's Cardiovascular Status Stable, Respiratory Function Stable, Patent Airway, No signs of Nausea or vomiting and Pain level controlled  Post-op Vital Signs: stable  Last Vitals:  Filed Vitals:   08/12/14 1401  BP: 136/79  Pulse: 63  Temp:   Resp: 20    Complications: No apparent anesthesia complications

## 2014-08-12 NOTE — Op Note (Signed)
Procedure: Right femoral to below knee popliteal bypass with non-reversed ipsilateral great saphenous vein, intraop agram  Preoperative diagnosis: Claudication  Postoperative diagnosis: Same  Anesthesia: General  Asst.: Leontine Locket, PA-C  Operative findings:     good quality saphenous vein 3-4 mm, 2 vessel runoff peroneal AT  Operative details: After obtaining informed consent, the patient was taken to the operating room. The patient was placed in supine position on the operating room table. After induction of general anesthesia and endotracheal intubation, a Foley catheter was placed. Next, the patient's entire right lower extremity was prepped and draped in the usual sterile fashion. A longitudinal incision was then made in the right groin and carried down through the subcutaneous tissues to expose the right common femoral artery.   The common femoral artery was dissected free circumferentially. There was a pulse within the common femoral artery. The distal external iliac artery was dissected free circumferentially underneath the inguinal ligament.  A vessel loop was also placed around the distal external iliac artery. The superficial femoral and profunda femoris artery were both dissected free circumferentially and vessel loops placed around these.  Next the saphenofemoral junction was identified in the medial portion of the groin incision and this was harvested through several skip incisions on the medial aspect of the leg.  Side branches were ligated and divided between silk ties or clips.  The vein was of good quality 3-4 mm diameter  The vein harvest incision was deepened into the fascia at the below knee segment and the below knee popliteal space was entered.  The popliteal artery was dissected free circumferentially.  It was very thickened on palpation but was clampable.  A tunnel was then created between the heads of the gastrocnemius muscle subsartorial up to the groin.  The vein was  ligated distally and at the saphenofemoral junction with a 2 0 silk tie.  The proximal end was oversewn with a 5 0 prolene.  The vein was gently distended with heparinized saline and inspected for hemostasis.    The patient was given 10000 units of heparin.  After appropriate circulation time, the distal right external iliac artery was controlled with a small Cooley clamp. The distal common femoral artery was controlled with a peripheral Debakey clamp.   A longitudinal opening was made in the common femoral artery on its anterior surface.  The vein was placed in a non reversed configuration.  The arteriotomy was extended with Pott's scissors.  The vein was spatulated and sewn end to side to the artery using a running 5 0 Prolene.  Just prior to completion anastomosis everything was forebled backbled and thoroughly flushed. Proximal clamp and distal clamps were removed and there was good pulsatile flow in the profunda femoris artery immediately. The SFA was chronically occluded.    The graft was then brought through the subsartorial tunnel down to the below-knee popliteal artery after marking for orientation. The below-knee popliteal artery was controlled proximally with a Henley clamp and distally with a vessel loop. A longitudinal opening was made in the distal below-knee popliteal artery in an area that was fairly free of calcification. The graft was then cut to length and spatulated and sewn end of graft to side of artery using running 6-0 Prolene suture.  At completion of the anastomosis everything was forebled backbled and thoroughly flushed. The remainder of the anastomosis was completed and all clamps were removed restoring pulsatile flow to the below-knee popliteal artery. An intraoperative arteriogram was obtained by introducing a  21 g butterfly into the proximal vein graft and agram obtained with inflow occlusion.  The distal anastomosis was patent with 2 vessel runoff AT and peroneal and an occluded  PT.  The patient had biphasic Doppler flow in the dorsalis pedis areas of the foot. One repair stitch was placed in the lateral wall of the proximal anastomosis and at the medial heel of the distal anastomosis.    The heparin was reversed with 50 mg of protamine.  After hemostasis was obtained, the deep layers and subcutaneous layers of the below-knee popliteal incision were closed with running 3-0 Vicryl suture. The skin was closed with staples.   The saphenectomy incisions were closed with running 3 0 vicryl follow by staples.  The groin was inspected and found to be hemostatic. This was then closed in multiple layers of running 2 0 and 3-0 Vicryl suture and 4-0 subcuticular stitch. The patient tolerated the procedure well and there were no complications. Instrument sponge and needle counts correct in the case. Patient was taken to the recovery in stable condition.  Ruta Hinds, MD [facility] [place] Office: [phone] Pager: [phone]

## 2014-08-12 NOTE — H&P (View-Only) (Signed)
   [facility] [place]  Subjective  - Doing well.   Objective 129/65 68 97.8 F (36.6 C) (Oral) 18 100%  Intake/Output Summary (Last 24 hours) at 08/10/14 0754 Last data filed at 08/09/14 1145  Gross per 24 hour  Intake    240 ml  Output      0 ml  Net    240 ml    Right groin soft without hematoma Angiogram:  The right superficial femoral artery is occluded. There is additionally a lengthy stent extending from the origin of the superficial femoral artery all the way down to the knee joint. The below-knee popliteal artery is patent. There is three-vessel runoff to the right foot although again the anterior tibial and dorsalis pedis artery is the primary runoff vessel.  Assessment/Planning: POD #1angiogram   The patient is scheduled for a right femoral to below-knee popliteal bypass on Monday, 08/12/2014. Discharge home this am  Laurence Slate [facility] 08/10/2014 7:54 AM -- Agree with above  D/c Epworth, MD [facility] [place]: [phone] Pager: [phone]  Laboratory Lab Results:  Recent Labs  08/09/14 0820 08/09/14 1937  WBC  --  9.7  HGB 15.3 13.5  HCT 45.0 39.9  PLT  --  202   BMET  Recent Labs  08/09/14 0820 08/09/14 1937  NA 140 137  K 3.9 4.0  CL 102 101  CO2  --  24  GLUCOSE 93 94  BUN 7 12  CREATININE 1.30 1.22  CALCIUM  --  9.2    COAG Lab Results  Component Value Date   INR 1.05 08/09/2014   INR 1.04 08/08/2012   No results found for: PTT

## 2014-08-13 ENCOUNTER — Encounter (HOSPITAL_COMMUNITY): Payer: Self-pay | Admitting: Vascular Surgery

## 2014-08-13 LAB — BASIC METABOLIC PANEL
ANION GAP: 10 (ref 5–15)
BUN: 13 mg/dL (ref 6–23)
CO2: 27 mEq/L (ref 19–32)
Calcium: 8.8 mg/dL (ref 8.4–10.5)
Chloride: 103 mEq/L (ref 96–112)
Creatinine, Ser: 1.19 mg/dL (ref 0.50–1.35)
GFR, EST AFRICAN AMERICAN: 80 mL/min — AB (ref >90)
GFR, EST NON AFRICAN AMERICAN: 69 mL/min — AB (ref >90)
Glucose, Bld: 97 mg/dL (ref 70–99)
Potassium: 4.8 mEq/L (ref 3.7–5.3)
SODIUM: 140 meq/L (ref 137–147)

## 2014-08-13 LAB — CBC
HCT: 39.5 % (ref 39.0–52.0)
Hemoglobin: 13.2 g/dL (ref 13.0–17.0)
MCH: 29.9 pg (ref 26.0–34.0)
MCHC: 33.4 g/dL (ref 30.0–36.0)
MCV: 89.4 fL (ref 78.0–100.0)
PLATELETS: 179 10*3/uL (ref 150–400)
RBC: 4.42 MIL/uL (ref 4.22–5.81)
RDW: 13.9 % (ref 11.5–15.5)
WBC: 14.7 10*3/uL — ABNORMAL HIGH (ref 4.0–10.5)

## 2014-08-13 MED ORDER — OXYCODONE HCL 5 MG PO TABS: 5.0000 mg | ORAL_TABLET | Freq: Four times a day (QID) | ORAL | Status: DC | PRN

## 2014-08-13 NOTE — Progress Notes (Signed)
OT Cancellation Note  Patient Details Name: Todd [name] MRN: [id number] DOB: Nov 11, 1960   Cancelled Treatment:    Reason Eval/Treat Not Completed: OT screened, no needs identified, will sign off. Spoke with PT and pt with no OT needs.   Benito Mccreedy OTR/L [phone] 08/13/2014, 10:24 AM

## 2014-08-13 NOTE — Progress Notes (Addendum)
   [facility] [place]  Subjective  - Doing well.  He wants to go home.  He is taking PO's well, ambulated, and has not voided yet.     Objective 104/71 62 97.7 F (36.5 C) (Oral) 16 99%  Intake/Output Summary (Last 24 hours) at 08/13/14 0716 Last data filed at 08/13/14 0500  Gross per 24 hour  Intake   3895 ml  Output   3095 ml  Net    800 ml    Doppler AT/DP biphasic Incision intact with staples, compartment soft. Pin point drainage distal incision dry dressing placed. Heart RRR  Assessment/Planning: POD #1 s/p: Right femoral to below knee popliteal bypass with non-reversed ipsilateral great saphenous vein, intraop agram Once he has voided we will discharge him home. F/U in 2 weeks   Laurence Slate [facility] 08/13/2014 7:16 AM -- Agree with above D/c home  Ruta Hinds, MD [facility] [place]: [phone] Pager: [phone]  Laboratory Lab Results:  Recent Labs  08/12/14 1610 08/13/14 0316  WBC 13.4* 14.7*  HGB 14.7 13.2  HCT 43.7 39.5  PLT 189 179   BMET  Recent Labs  08/12/14 1610 08/13/14 0316  NA  --  140  K  --  4.8  CL  --  103  CO2  --  27  GLUCOSE  --  97  BUN  --  13  CREATININE 1.23 1.19  CALCIUM  --  8.8    COAG Lab Results  Component Value Date   INR 1.05 08/09/2014   INR 1.04 08/08/2012   No results found for: PTT

## 2014-08-13 NOTE — Evaluation (Signed)
Physical Therapy Evaluation/Discharge Patient Details Name: CHRISS BUSSMAN MRN: [id number] DOB: 05/25/1961 Today's Date: 08/13/2014   History of Present Illness  Pt s/p Right fem-pop BPG  Clinical Impression  Patient is fully mobile and has no limitations that would prevent discharge or necessitate further PT intervention.  He demonstrated that he could don and doff his socks without assistance or pain.  Educated pt on lifting and driving precautions and importance of exercises and positioning to regain full ROM in RLE.  All education provided at this time.  Patient verbalized understanding and is in agreement.  Will sign off.    Follow Up Recommendations No PT follow up    Equipment Recommendations  None recommended by PT    Recommendations for Other Services       Precautions / Restrictions Precautions Precautions: None      Mobility  Bed Mobility Overal bed mobility: Modified Independent                Transfers Overall transfer level: Independent                  Ambulation/Gait Ambulation/Gait assistance: Modified independent (Device/Increase time) Ambulation Distance (Feet): 300 Feet Assistive device: None Gait Pattern/deviations: WFL(Within Functional Limits)   Gait velocity interpretation: at or above normal speed for age/gender General Gait Details: pt with one LOB able to recover without assist  Stairs            Wheelchair Mobility    Modified Rankin (Stroke Patients Only)       Balance Overall balance assessment: Needs assistance   Sitting balance-Leahy Scale: Normal       Standing balance-Leahy Scale: Good                               Pertinent Vitals/Pain Pain Assessment: No/denies pain    Home Living Family/patient expects to be discharged to:: Private residence Living Arrangements: Spouse/significant other Available Help at Discharge: Family;Available PRN/intermittently Type of Home: Apartment Home  Access: Level entry     Home Layout: One level Home Equipment: None      Prior Function Level of Independence: Independent               Hand Dominance        Extremity/Trunk Assessment   Upper Extremity Assessment: Overall WFL for tasks assessed           Lower Extremity Assessment: Overall WFL for tasks assessed      Cervical / Trunk Assessment: Normal  Communication   Communication: No difficulties  Cognition Arousal/Alertness: Awake/alert Behavior During Therapy: WFL for tasks assessed/performed Overall Cognitive Status: Within Functional Limits for tasks assessed                      General Comments      Exercises General Exercises - Lower Extremity Long Arc Quad: AROM;Seated;Right;20 reps Toe Raises: AROM;Seated;Right;20 reps      Assessment/Plan    PT Assessment Patent does not need any further PT services  PT Diagnosis Acute pain   PT Problem List    PT Treatment Interventions     PT Goals (Current goals can be found in the Care Plan section) Acute Rehab PT Goals PT Goal Formulation: All assessment and education complete, DC therapy    Frequency     Barriers to discharge        Co-evaluation  End of Session   Activity Tolerance: Patient tolerated treatment well Patient left: in chair;with bed alarm set Nurse Communication: Mobility status         Time: 5009-3818 PT Time Calculation (min) (ACUTE ONLY): 12 min   Charges:   PT Evaluation $Initial PT Evaluation Tier I: 1 Procedure     PT G Codes:          Curlie Sittner SPT 08/13/2014, 10:57 AM

## 2014-08-13 NOTE — Progress Notes (Signed)
Pt d/c'd home per MD orders. Pt given d/c instructions, pt verbalizes understanding. Per pt request, pt d/c'd to hospital lobby via wheelchair along with pt's belongings. Pt states that his father will pick him up and he has his cell phone and is able to get in contact with his father if necessary. VS stable, no s/s of acute distress noted.

## 2014-08-13 NOTE — Progress Notes (Signed)
CARE MANAGEMENT NOTE 08/13/2014  Patient:  Todd Todd [name], Todd Todd [name]   Account Number:  [id number]  Date Initiated:  08/13/2014  Documentation initiated by:  [facility]  Subjective/Objective Assessment:   s/p: Right femoral to below knee popliteal bypass     Action/Plan:   home with family   Anticipated DC Date:  08/13/2014   Anticipated DC Plan:  [place]  CM consult      Choice offered to / List presented to:             Status of service:  Completed, signed off Medicare Important Message given?  NA - LOS <3 / Initial given by admissions (If response is "NO", the following Medicare IM given date fields will be blank) Date Medicare IM given:   Medicare IM given by:   Date Additional Medicare IM given:   Additional Medicare IM given by:   Todd [name] Discharge Disposition:  HOME/SELF CARE  Per UR Regulation:  Reviewed for med. necessity/level of care/duration of stay  If discussed at [place] of Stay Meetings, dates discussed:    Comments:  08/13/2014 1130 No needs identified. Todd Finner RN CCM Case Mgmt

## 2014-08-14 NOTE — Discharge Summary (Signed)
[facility] Discharge Summary   Patient ID:  Todd [name] MRN: [id number] DOB/AGE: [birth year] 53 y.o.  Admit date: 08/12/2014 Discharge date: 08/14/2014 Date of Surgery: 08/12/2014 Surgeon: Surgeon(s): Sherren Kerns, MD  Admission Diagnosis: Peripheral Vascular Disease with Right Lower Extremity Claudication I70.211  Discharge Diagnoses:  Peripheral Vascular Disease with Right Lower Extremity Claudication I70.211  Secondary Diagnoses: Past Medical History  Diagnosis Date  . Hypertension   . Hyperlipidemia   . Peripheral vascular disease     a. bilateral superficial femoral artery stents in May/August of 2012. b. Left femoral to above-knee popliteal bypass in 08/2012.  . Tobacco abuse   . CAD (coronary artery disease)     a. Cath 07/2012 following abnormal nuclear stress test: 20-30% LAD, 20-30% OM1, occluded branch off OM2 that fills slowly via collaterals, small nondominant RCA occluded proximally, fills slowly via collaterals.  . CKD (chronic kidney disease), stage II   . Renal insufficiency   . Stroke     2008 right side weakness  . Depression     Procedure(s):  FEMORAL-POPLITEAL ARTERY BYPASS WITH SAPHENOUS VEIN GRAFT, INTRAOPERATIVE ARTERIOGRAM INTRA OPERATIVE ARTERIOGRAM  Discharged Condition: good  HPI: 53 year old male who reports claudication symptoms in the right leg. He is status post left femoral to above-knee popliteal bypass with propaten on December 31.  After his angiogram 08/09/2014 he was scheduled for a right femoral to below-knee popliteal bypass on Monday, 08/12/2014.  chronic occlusion right superficial femoral artery with reconstitution of the right below-knee popliteal artery and three-vessel runoff primarily via the anterior tibial artery     Hospital Course:  Todd [name] is a 53 y.o. male is S/P  Procedure(s):  FEMORAL-POPLITEAL ARTERY BYPASS WITH SAPHENOUS VEIN GRAFT, INTRAOPERATIVE ARTERIOGRAM INTRA OPERATIVE  ARTERIOGRAM Pod#1stable ambulating, taking PO's well, and pain is well controlled.  He will be discharged home and f/u in the office in 2 weeks.     Significant Diagnostic Studies: CBC Lab Results  Component Value Date   WBC 14.7* 08/13/2014   HGB 13.2 08/13/2014   HCT 39.5 08/13/2014   MCV 89.4 08/13/2014   PLT 179 08/13/2014    BMET    Component Value Date/Time   NA 140 08/13/2014 0316   K 4.8 08/13/2014 0316   CL 103 08/13/2014 0316   CO2 27 08/13/2014 0316   GLUCOSE 97 08/13/2014 0316   BUN 13 08/13/2014 0316   CREATININE 1.19 08/13/2014 0316   CREATININE 1.17 08/08/2012 1640   CALCIUM 8.8 08/13/2014 0316   GFRNONAA 69* 08/13/2014 0316   GFRAA 80* 08/13/2014 0316   COAG Lab Results  Component Value Date   INR 1.05 08/09/2014   INR 1.04 08/08/2012     Disposition:  Discharge to :Home Discharge Instructions    Call MD for:  redness, tenderness, or signs of infection (pain, swelling, bleeding, redness, odor or green/yellow discharge around incision site)    Complete by:  As directed      Call MD for:  severe or increased pain, loss or decreased feeling  in affected limb(s)    Complete by:  As directed      Call MD for:  temperature >100.5    Complete by:  As directed      Discharge instructions    Complete by:  As directed   You may shower in 24 hours.  Dry dressing if you have drainage to the incision site.     Driving Restrictions    Complete  by:  As directed   No driving for 2 weeks or until you are no longer taking pain medication.     Lifting restrictions    Complete by:  As directed   No lifting for 6 weeks     Resume previous diet    Complete by:  As directed             Medication List    TAKE these medications        aspirin EC 81 MG tablet  Take 81 mg by mouth daily.     metoprolol succinate 25 MG 24 hr tablet  Commonly known as:  TOPROL XL  Take 1 tablet (25 mg total) by mouth daily.     oxyCODONE 5 MG immediate release tablet   Commonly known as:  Oxy IR/ROXICODONE  Take 1-2 tablets (5-10 mg total) by mouth every 6 (six) hours as needed for moderate pain.     rosuvastatin 10 MG tablet  Commonly known as:  CRESTOR  Take 10 mg by mouth at bedtime.     valACYclovir 500 MG tablet  Commonly known as:  VALTREX  Take 500 mg by mouth at bedtime.       Verbal and written Discharge instructions given to the patient. Wound care per Discharge AVS   Signed: Clinton Gallant [facility] 08/14/2014, 11:00 AM  - For VQI Registry use --- Instructions: Press F2 to tab through selections.  Delete question if not applicable.   Post-op:  Wound infection: No  Graft infection: No  Transfusion: No  If yes, 0 units given New Arrhythmia: No Ipsilateral amputation: [x ] no, [ ]  Minor, [ ]  BKA, [ ]  AKA Discharge patency: [x ] Primary, [ ]  Primary assisted, [ ]  Secondary, [ ]  Occluded Patency judged by: [x ] Dopper only, [ ]  Palpable graft pulse, [ ]  Palpable distal pulse, [ ]  ABI inc. > 0.15, [ ]  Duplex  D/C Ambulatory Status: Ambulatory  Complications: MI: [x ] No, [ ]  Troponin only, [ ]  EKG or Clinical CHF: No Resp failure: [x ] none, [ ]  Pneumonia, [ ]  Ventilator Chg in renal function: [x ] none, [ ]  Inc. Cr > 0.5, [ ]  Temp. Dialysis, [ ]  Permanent dialysis Stroke: [x ] None, [ ]  Minor, [ ]  Major Return to OR: No  Reason for return to OR: [ ]  Bleeding, [ ]  Infection, [ ]  Thrombosis, [ ]  Revision  Discharge medications: Statin use:  Yes ASA use:  Yes Plavix use:  No  for medical reason   Beta blocker use: Yes Coumadin use: No  for medical reason

## 2014-08-14 NOTE — Discharge Summary (Signed)
[facility] Discharge Summary   Patient ID:  Todd [name] MRN: [id number] DOB/AGE: [birth year] 51 y.o.  Admit date: 08/09/2014 Discharge date: 08/14/2014 Date of Surgery: 08/09/2014 Surgeon: Surgeon(s): Sherren Kerns, MD  Admission Diagnosis: pvd with right leg claudication  Discharge Diagnoses:  pvd with right leg claudication  Secondary Diagnoses: Past Medical History  Diagnosis Date  . Hypertension   . Hyperlipidemia   . Peripheral vascular disease     a. bilateral superficial femoral artery stents in May/August of 2012. b. Left femoral to above-knee popliteal bypass in 08/2012.  . Tobacco abuse   . CAD (coronary artery disease)     a. Cath 07/2012 following abnormal nuclear stress test: 20-30% LAD, 20-30% OM1, occluded branch off OM2 that fills slowly via collaterals, small nondominant RCA occluded proximally, fills slowly via collaterals.  . CKD (chronic kidney disease), stage II   . Renal insufficiency   . Stroke     2008 right side weakness  . Depression     Procedure(s): ABDOMINAL AORTAGRAM ANGIOGRAM EXTREMITY BILATERAL  Discharged Condition: good  HPI: 53 year old male who reports claudication symptoms in the right leg.  He is status post left femoral to above-knee popliteal bypass with propaten on December 31.    Hospital Course:  Todd [name] is a 53 y.o. male is S/P  Procedure(s): ABDOMINAL AORTAGRAM ANGIOGRAM EXTREMITY BILATERAL He was observed over night and discharged home POD#1 in stable condition.    Significant Diagnostic Studies: CBC Lab Results  Component Value Date   WBC 14.7* 08/13/2014   HGB 13.2 08/13/2014   HCT 39.5 08/13/2014   MCV 89.4 08/13/2014   PLT 179 08/13/2014    BMET    Component Value Date/Time   NA 140 08/13/2014 0316   K 4.8 08/13/2014 0316   CL 103 08/13/2014 0316   CO2 27 08/13/2014 0316   GLUCOSE 97 08/13/2014 0316   BUN 13 08/13/2014 0316   CREATININE 1.19 08/13/2014 0316   CREATININE 1.17 08/08/2012 1640   CALCIUM 8.8 08/13/2014 0316   GFRNONAA 69* 08/13/2014 0316   GFRAA 80* 08/13/2014 0316   COAG Lab Results  Component Value Date   INR 1.05 08/09/2014   INR 1.04 08/08/2012     Disposition:  Discharge to :Home Discharge Instructions    Call MD for:  redness, tenderness, or signs of infection (pain, swelling, bleeding, redness, odor or green/yellow discharge around incision site)    Complete by:  As directed      Call MD for:  severe or increased pain, loss or decreased feeling  in affected limb(s)    Complete by:  As directed      Call MD for:  temperature >100.5    Complete by:  As directed      Discharge instructions    Complete by:  As directed   Fem-pop 08/12/2014     Discharge patient    Complete by:  As directed   Discharge pt to home     Driving Restrictions    Complete by:  As directed   No driving for 24 hours     Lifting restrictions    Complete by:  As directed   No lifting for 6 weeks     Resume previous diet    Complete by:  As directed             Medication List    TAKE these medications        aspirin EC  81 MG tablet  Take 81 mg by mouth daily.     metoprolol succinate 25 MG 24 hr tablet  Commonly known as:  TOPROL XL  Take 1 tablet (25 mg total) by mouth daily.     rosuvastatin 10 MG tablet  Commonly known as:  CRESTOR  Take 10 mg by mouth at bedtime.     valACYclovir 500 MG tablet  Commonly known as:  VALTREX  Take 500 mg by mouth at bedtime.       Verbal and written Discharge instructions given to the patient. Wound care per Discharge AVS   Signed: Clinton Gallant [facility] 08/14/2014, 10:55 AM

## 2014-08-16 ENCOUNTER — Telehealth: Payer: Self-pay | Admitting: Vascular Surgery

## 2014-08-16 NOTE — Telephone Encounter (Addendum)
-----   Message from Mena Goes, RN sent at 08/16/2014 10:41 AM EST ----- Regarding: Schedule   ----- Message -----    From: Ulyses Amor, PA-C    Sent: 08/16/2014  10:18 AM      To: [facility] Charge Pool  S/P fem-pop f/u in 2 weeks with Dr. Oneida Alar  notified patient of post op appt. on 09-05-14 at 8:45 am with dr. Oneida Alar

## 2014-09-04 ENCOUNTER — Encounter: Payer: Self-pay | Admitting: Vascular Surgery

## 2014-09-05 ENCOUNTER — Ambulatory Visit (INDEPENDENT_AMBULATORY_CARE_PROVIDER_SITE_OTHER): Payer: PRIVATE HEALTH INSURANCE | Admitting: Vascular Surgery

## 2014-09-05 ENCOUNTER — Encounter: Payer: Self-pay | Admitting: Vascular Surgery

## 2014-09-05 VITALS — BP 153/99 | HR 66 | Temp 98.1°F | Ht 73.0 in | Wt 194.0 lb

## 2014-09-05 DIAGNOSIS — I739 Peripheral vascular disease, unspecified: Secondary | ICD-10-CM

## 2014-09-05 MED ORDER — OXYCODONE HCL 5 MG PO TABS: 5.0000 mg | ORAL_TABLET | Freq: Four times a day (QID) | ORAL | Status: DC | PRN

## 2014-09-05 MED FILL — Sodium Chloride IV Soln 0.9%: INTRAVENOUS | Qty: 1000 | Status: AC

## 2014-09-05 NOTE — Progress Notes (Signed)
Patient is a 53 year old male who is status post right femoral to below-knee popliteal bypass with non-reversed greater saphenous vein on 08/12/2014.. The patient states that his right foot is significantly improved. He began to develop some drainage from his below-knee incision a few days ago. He has been washing this daily with soap and water. It is draining clear fluid. He denies fever or chills.  Physical exam:  Filed Vitals:   09/05/14 0844  BP: 153/99  Pulse: 66  Temp: 98.1 F (36.7 C)  TempSrc: Oral  Height: 6\' 1"  (1.854 m)  Weight: 194 lb (87.998 kg)  SpO2: 100%    Right lower extremity: Small amount of swelling in the right groin possibly a seroma no drainage no erythema right thigh incisions healing well right below-knee incision has a 3 cm opening with some granulation tissue at the base and fairly continuous drainage of clear fluid right foot is pink/warm  Assessment: Patent right lower extremity bypass graft but with poorly healing below-knee incision plan: Local wound care dry dressing right below-knee incision remained her staples removed today patient will follow-up in 2 weeks to recheck wound. He was given a prescription today for 40 additional 5 mg oxycodone. He should not need any further refills after that.  Ruta Hinds, MD [facility] [place] Office: [phone] Pager: [phone]

## 2014-09-25 ENCOUNTER — Encounter: Payer: Self-pay | Admitting: Vascular Surgery

## 2014-09-26 ENCOUNTER — Encounter: Payer: Self-pay | Admitting: Vascular Surgery

## 2014-09-26 ENCOUNTER — Ambulatory Visit (INDEPENDENT_AMBULATORY_CARE_PROVIDER_SITE_OTHER): Payer: Self-pay | Admitting: Vascular Surgery

## 2014-09-26 VITALS — BP 155/102 | HR 76 | Temp 98.1°F | Ht 73.0 in | Wt 192.5 lb

## 2014-09-26 DIAGNOSIS — Z48812 Encounter for surgical aftercare following surgery on the circulatory system: Secondary | ICD-10-CM

## 2014-09-26 DIAGNOSIS — I739 Peripheral vascular disease, unspecified: Secondary | ICD-10-CM

## 2014-09-26 MED ORDER — ACETAMINOPHEN-CODEINE #3 300-30 MG PO TABS: 1.0000 | ORAL_TABLET | Freq: Three times a day (TID) | ORAL | Status: DC | PRN

## 2014-09-26 NOTE — Progress Notes (Signed)
Patient is a 53 year old male who is status post right femoral to below-knee popliteal bypass with non-reversed greater saphenous vein on 08/12/2014.. The patient states that his right foot is significantly improved. He began to develop some drainage from his below-knee incision a few days ago. He has been washing this daily with soap and water. It is draining clear fluid. He denies fever or chills.  Physical exam:    Filed Vitals:   09/26/14 0845  BP: 155/102  Pulse: 76  Temp: 98.1 F (36.7 C)  TempSrc: Oral  Height: 6\' 1"  (1.854 m)  Weight: 192 lb 8 oz (87.317 kg)  SpO2: 100%    Right lower extremity: Minimal swelling in the right groinright thigh incisions healing well right below-knee incision has a 2 cm less than 1 mm depth opening with some granulation tissue at the base no drainage right foot is pink/warm  Assessment:  Local wound care dry dressing right below-knee incision remainder of staples removed today patient will follow-up in February for a graft duplex scan. He was given a prescription today for Tylenol 3 #10 dispensed. It was discussed with the patient today that the leg swelling should continue to improve although he was given a prescription for a compression stocking today if necessary. He is now over 6 weeks out from his operation and I do not believe he needs further pain medication after this prescription.  Ruta Hinds, MD [facility] [place] Office: [phone] Pager: [phone]

## 2014-09-30 NOTE — Addendum Note (Signed)
Addended by: Mena Goes on: 09/30/2014 05:32 PM   Modules accepted: Orders

## 2014-11-20 ENCOUNTER — Encounter: Payer: Self-pay | Admitting: Vascular Surgery

## 2014-11-21 ENCOUNTER — Encounter (HOSPITAL_COMMUNITY): Payer: PRIVATE HEALTH INSURANCE

## 2014-11-21 ENCOUNTER — Ambulatory Visit: Payer: PRIVATE HEALTH INSURANCE | Admitting: Vascular Surgery

## 2014-11-21 ENCOUNTER — Other Ambulatory Visit (HOSPITAL_COMMUNITY): Payer: PRIVATE HEALTH INSURANCE

## 2014-12-04 ENCOUNTER — Encounter: Payer: Self-pay | Admitting: Vascular Surgery

## 2014-12-05 ENCOUNTER — Ambulatory Visit (INDEPENDENT_AMBULATORY_CARE_PROVIDER_SITE_OTHER): Payer: Medicare Other | Admitting: Vascular Surgery

## 2014-12-05 ENCOUNTER — Encounter: Payer: Self-pay | Admitting: Vascular Surgery

## 2014-12-05 ENCOUNTER — Ambulatory Visit (HOSPITAL_COMMUNITY)
Admission: RE | Admit: 2014-12-05 | Discharge: 2014-12-05 | Disposition: A | Discharge status: Home / Self Care | Payer: Medicare Other | Source: Ambulatory Visit | Attending: Vascular Surgery | Admitting: Vascular Surgery

## 2014-12-05 ENCOUNTER — Ambulatory Visit (INDEPENDENT_AMBULATORY_CARE_PROVIDER_SITE_OTHER)
Admission: RE | Admit: 2014-12-05 | Discharge: 2014-12-05 | Disposition: A | Discharge status: Home / Self Care | Payer: Medicare Other | Source: Ambulatory Visit | Attending: Vascular Surgery | Admitting: Vascular Surgery

## 2014-12-05 VITALS — BP 173/110 | HR 62 | Ht 73.0 in | Wt 188.5 lb

## 2014-12-05 DIAGNOSIS — Z48812 Encounter for surgical aftercare following surgery on the circulatory system: Secondary | ICD-10-CM | POA: Diagnosis not present

## 2014-12-05 DIAGNOSIS — I739 Peripheral vascular disease, unspecified: Secondary | ICD-10-CM

## 2014-12-05 DIAGNOSIS — I2510 Atherosclerotic heart disease of native coronary artery without angina pectoris: Secondary | ICD-10-CM

## 2014-12-05 MED ORDER — METOPROLOL SUCCINATE ER 25 MG PO TB24: 50.0000 mg | ORAL_TABLET | Freq: Every day | ORAL | Status: DC

## 2014-12-05 NOTE — Progress Notes (Signed)
Patient is a 54 year old male who is status post right femoral to below-knee popliteal bypass with non-reversed greater saphenous vein on 08/12/2014. He had peroneal and anterior tibial artery runoff. The patient states that his right foot is significantly improved. All of his incisions are completely healed.  He previously had a left femoral to above-knee popliteal bypass with PTFE and vein patch proximally and distally in 2013. He had single vessel anterior tibial runoff to the left foot. He has no symptoms of claudication rest pain or ulcers in either foot at this point.  Unfortunately he continues to smoke. He denies shortness of breath. He denies chest pain.   Physical exam:    Filed Vitals:   12/05/14 1602  BP: 173/110  Pulse: 62  Height: 6\' 1"  (1.854 m)  Weight: 188 lb 8 oz (85.503 kg)  SpO2: 99%    Right lower extremity: Minimal swelling in the leg, right foot is pink/warm, no palpable pedal pulses  Left lower extremity: Foot pink and warm no palpable pedal pulses  Data: Patient had bilateral ABIs performed today which were 1.03 on the left 1.05 on the right triphasic in the dorsalis pedis bilaterally. Graft duplex scan showed no significant narrowing. I reviewed and interpreted both of these studies.  Assessment:  bilateral patent bypass grafts. No claudication symptoms. The patient was hypertensive today. I increased his Toprol from 25 mg once daily to 50 mg once daily. We will also arrange for him to see his primary care physician at [place] in the near future to follow-up on his blood pressure. He will follow-up with [place] in 3 months for graft duplex scan of bilateral ABIs.   Ruta Hinds, MD [facility] [place] Office: [phone] Pager: [phone]

## 2015-02-18 ENCOUNTER — Encounter: Payer: Medicaid Other | Admitting: Cardiology

## 2015-02-18 NOTE — Progress Notes (Signed)
Encounter opened in error

## 2015-03-03 ENCOUNTER — Encounter: Payer: Self-pay | Admitting: Vascular Surgery

## 2015-03-03 ENCOUNTER — Other Ambulatory Visit: Payer: Self-pay | Admitting: Vascular Surgery

## 2015-03-03 DIAGNOSIS — I739 Peripheral vascular disease, unspecified: Secondary | ICD-10-CM

## 2015-03-03 DIAGNOSIS — Z48812 Encounter for surgical aftercare following surgery on the circulatory system: Secondary | ICD-10-CM

## 2015-03-06 ENCOUNTER — Encounter: Payer: Self-pay | Admitting: Family

## 2015-03-06 ENCOUNTER — Ambulatory Visit (INDEPENDENT_AMBULATORY_CARE_PROVIDER_SITE_OTHER): Payer: Medicare Other | Admitting: Family

## 2015-03-06 ENCOUNTER — Ambulatory Visit (HOSPITAL_COMMUNITY)
Admission: RE | Admit: 2015-03-06 | Discharge: 2015-03-06 | Disposition: A | Discharge status: Home / Self Care | Payer: Medicare Other | Source: Ambulatory Visit | Attending: Vascular Surgery | Admitting: Vascular Surgery

## 2015-03-06 ENCOUNTER — Ambulatory Visit (INDEPENDENT_AMBULATORY_CARE_PROVIDER_SITE_OTHER)
Admission: RE | Admit: 2015-03-06 | Discharge: 2015-03-06 | Disposition: A | Discharge status: Home / Self Care | Payer: Medicare Other | Source: Ambulatory Visit | Attending: Vascular Surgery | Admitting: Vascular Surgery

## 2015-03-06 VITALS — BP 144/89 | HR 61 | Resp 16 | Ht 73.0 in | Wt 195.0 lb

## 2015-03-06 DIAGNOSIS — Z95828 Presence of other vascular implants and grafts: Secondary | ICD-10-CM

## 2015-03-06 DIAGNOSIS — F17200 Nicotine dependence, unspecified, uncomplicated: Secondary | ICD-10-CM

## 2015-03-06 DIAGNOSIS — Z9889 Other specified postprocedural states: Secondary | ICD-10-CM | POA: Diagnosis not present

## 2015-03-06 DIAGNOSIS — I739 Peripheral vascular disease, unspecified: Secondary | ICD-10-CM

## 2015-03-06 DIAGNOSIS — Z720 Tobacco use: Secondary | ICD-10-CM

## 2015-03-06 DIAGNOSIS — Z48812 Encounter for surgical aftercare following surgery on the circulatory system: Secondary | ICD-10-CM | POA: Diagnosis not present

## 2015-03-06 DIAGNOSIS — I779 Disorder of arteries and arterioles, unspecified: Secondary | ICD-10-CM | POA: Diagnosis not present

## 2015-03-06 NOTE — Patient Instructions (Signed)
Peripheral Vascular Disease Peripheral Vascular Disease (PVD), also called Peripheral Arterial Disease (PAD), is a circulation problem caused by cholesterol (atherosclerotic plaque) deposits in the arteries. PVD commonly occurs in the lower extremities (legs) but it can occur in other areas of the body, such as your arms. The cholesterol buildup in the arteries reduces blood flow which can cause pain and other serious problems. The presence of PVD can place a person at risk for Coronary Artery Disease (CAD).  CAUSES  Causes of PVD can be many. It is usually associated with more than one risk factor such as:   High Cholesterol.  Smoking.  Diabetes.  Lack of exercise or inactivity.  High blood pressure (hypertension).  Obesity.  Family history. SYMPTOMS   When the lower extremities are affected, patients with PVD may experience:  Leg pain with exertion or physical activity. This is called INTERMITTENT CLAUDICATION. This may present as cramping or numbness with physical activity. The location of the pain is associated with the level of blockage. For example, blockage at the abdominal level (distal abdominal aorta) may result in buttock or hip pain. Lower leg arterial blockage may result in calf pain.  As PVD becomes more severe, pain can develop with less physical activity.  In people with severe PVD, leg pain may occur at rest.  Other PVD signs and symptoms:  Leg numbness or weakness.  Coldness in the affected leg or foot, especially when compared to the other leg.  A change in leg color.  Patients with significant PVD are more prone to ulcers or sores on toes, feet or legs. These may take longer to heal or may reoccur. The ulcers or sores can become infected.  If signs and symptoms of PVD are ignored, gangrene may occur. This can result in the loss of toes or loss of an entire limb.  Not all leg pain is related to PVD. Other medical conditions can cause leg pain such  as:  Blood clots (embolism) or Deep Vein Thrombosis.  Inflammation of the blood vessels (vasculitis).  Spinal stenosis. DIAGNOSIS  Diagnosis of PVD can involve several different types of tests. These can include:  Pulse Volume Recording Method (PVR). This test is simple, painless and does not involve the use of X-rays. PVR involves measuring and comparing the blood pressure in the arms and legs. An ABI (Ankle-Brachial Index) is calculated. The normal ratio of blood pressures is 1. As this number becomes smaller, it indicates more severe disease.  < 0.95 - indicates significant narrowing in one or more leg vessels.  <0.8 - there will usually be pain in the foot, leg or buttock with exercise.  <0.4 - will usually have pain in the legs at rest.  <0.25 - usually indicates limb threatening PVD.  Doppler detection of pulses in the legs. This test is painless and checks to see if you have a pulses in your legs/feet.  A dye or contrast material (a substance that highlights the blood vessels so they show up on x-ray) may be given to help your caregiver better see the arteries for the following tests. The dye is eliminated from your body by the kidney's. Your caregiver may order blood work to check your kidney function and other laboratory values before the following tests are performed:  Magnetic Resonance Angiography (MRA). An MRA is a picture study of the blood vessels and arteries. The MRA machine uses a large magnet to produce images of the blood vessels.  Computed Tomography Angiography (CTA). A CTA   is a specialized x-ray that looks at how the blood flows in your blood vessels. An IV may be inserted into your arm so contrast dye can be injected.  Angiogram. Is a procedure that uses x-rays to look at your blood vessels. This procedure is minimally invasive, meaning a small incision (cut) is made in your groin. A small tube (catheter) is then inserted into the artery of your groin. The catheter  is guided to the blood vessel or artery your caregiver wants to examine. Contrast dye is injected into the catheter. X-rays are then taken of the blood vessel or artery. After the images are obtained, the catheter is taken out. TREATMENT  Treatment of PVD involves many interventions which may include:  Lifestyle changes:  Quitting smoking.  Exercise.  Following a low fat, low cholesterol diet.  Control of diabetes.  Foot care is very important to the PVD patient. Good foot care can help prevent infection.  Medication:  Cholesterol-lowering medicine.  Blood pressure medicine.  Anti-platelet drugs.  Certain medicines may reduce symptoms of Intermittent Claudication.  Interventional/Surgical options:  Angioplasty. An Angioplasty is a procedure that inflates a balloon in the blocked artery. This opens the blocked artery to improve blood flow.  Stent Implant. A wire mesh tube (stent) is placed in the artery. The stent expands and stays in place, allowing the artery to remain open.  Peripheral Bypass Surgery. This is a surgical procedure that reroutes the blood around a blocked artery to help improve blood flow. This type of procedure may be performed if Angioplasty or stent implants are not an option. SEEK IMMEDIATE MEDICAL CARE IF:   You develop pain or numbness in your arms or legs.  Your arm or leg turns cold, becomes blue in color.  You develop redness, warmth, swelling and pain in your arms or legs. MAKE SURE YOU:   Understand these instructions.  Will watch your condition.  Will get help right away if you are not doing well or get worse. Document Released: 10/21/2004 Document Revised: 12/06/2011 Document Reviewed: 09/17/2008 [employer] Patient Information 2015 [employer]. This information is not intended to replace advice given to you by your health care provider. Make sure you discuss any questions you have with your health care provider.    Smoking  Cessation Quitting smoking is important to your health and has many advantages. However, it is not always easy to quit since nicotine is a very addictive drug. Oftentimes, people try 3 times or more before being able to quit. This document explains the best ways for you to prepare to quit smoking. Quitting takes hard work and a lot of effort, but you can do it. ADVANTAGES OF QUITTING SMOKING  You will live longer, feel better, and live better.  Your body will feel the impact of quitting smoking almost immediately.  Within 20 minutes, blood pressure decreases. Your pulse returns to its normal level.  After 8 hours, carbon monoxide levels in the blood return to normal. Your oxygen level increases.  After 24 hours, the chance of having a heart attack starts to decrease. Your breath, hair, and body stop smelling like smoke.  After 48 hours, damaged nerve endings begin to recover. Your sense of taste and smell improve.  After 72 hours, the body is virtually free of nicotine. Your bronchial tubes relax and breathing becomes easier.  After 2 to 12 weeks, lungs can hold more air. Exercise becomes easier and circulation improves.  The risk of having a heart attack, stroke,   cancer, or lung disease is greatly reduced.  After 1 year, the risk of coronary heart disease is cut in half.  After 5 years, the risk of stroke falls to the same as a nonsmoker.  After 10 years, the risk of lung cancer is cut in half and the risk of other cancers decreases significantly.  After 15 years, the risk of coronary heart disease drops, usually to the level of a nonsmoker.  If you are pregnant, quitting smoking will improve your chances of having a healthy baby.  The people you live with, especially any children, will be healthier.  You will have extra money to spend on things other than cigarettes. QUESTIONS TO THINK ABOUT BEFORE ATTEMPTING TO QUIT You may want to talk about your answers with your health care  provider.  Why do you want to quit?  If you tried to quit in the past, what helped and what did not?  What will be the most difficult situations for you after you quit? How will you plan to handle them?  Who can help you through the tough times? Your family? Friends? A health care provider?  What pleasures do you get from smoking? What ways can you still get pleasure if you quit? Here are some questions to ask your health care provider:  How can you help me to be successful at quitting?  What medicine do you think would be best for me and how should I take it?  What should I do if I need more help?  What is smoking withdrawal like? How can I get information on withdrawal? GET READY  Set a quit date.  Change your environment by getting rid of all cigarettes, ashtrays, matches, and lighters in your home, car, or work. Do not let people smoke in your home.  Review your past attempts to quit. Think about what worked and what did not. GET SUPPORT AND ENCOURAGEMENT You have a better chance of being successful if you have help. You can get support in many ways.  Tell your family, friends, and coworkers that you are going to quit and need their support. Ask them not to smoke around you.  Get individual, group, or telephone counseling and support. Programs are available at local hospitals and health centers. Call your local health department for information about programs in your area.  Spiritual beliefs and practices may help some smokers quit.  Download a "quit meter" on your computer to keep track of quit statistics, such as how long you have gone without smoking, cigarettes not smoked, and money saved.  Get a self-help book about quitting smoking and staying off tobacco. LEARN NEW SKILLS AND BEHAVIORS  Distract yourself from urges to smoke. Talk to someone, go for a walk, or occupy your time with a task.  Change your normal routine. Take a different route to work. Drink tea  instead of coffee. Eat breakfast in a different place.  Reduce your stress. Take a hot bath, exercise, or read a book.  Plan something enjoyable to do every day. Reward yourself for not smoking.  Explore interactive web-based programs that specialize in helping you quit. GET MEDICINE AND USE IT CORRECTLY Medicines can help you stop smoking and decrease the urge to smoke. Combining medicine with the above behavioral methods and support can greatly increase your chances of successfully quitting smoking.  Nicotine replacement therapy helps deliver nicotine to your body without the negative effects and risks of smoking. Nicotine replacement therapy includes nicotine gum, lozenges,   inhalers, nasal sprays, and skin patches. Some may be available over-the-counter and others require a prescription.  Antidepressant medicine helps people abstain from smoking, but how this works is unknown. This medicine is available by prescription.  Nicotinic receptor partial agonist medicine simulates the effect of nicotine in your brain. This medicine is available by prescription. Ask your health care provider for advice about which medicines to use and how to use them based on your health history. Your health care provider will tell you what side effects to look out for if you choose to be on a medicine or therapy. Carefully read the information on the package. Do not use any other product containing nicotine while using a nicotine replacement product.  RELAPSE OR DIFFICULT SITUATIONS Most relapses occur within the first 3 months after quitting. Do not be discouraged if you start smoking again. Remember, most people try several times before finally quitting. You may have symptoms of withdrawal because your body is used to nicotine. You may crave cigarettes, be irritable, feel very hungry, cough often, get headaches, or have difficulty concentrating. The withdrawal symptoms are only temporary. They are strongest when you  first quit, but they will go away within 10-14 days. To reduce the chances of relapse, try to:  Avoid drinking alcohol. Drinking lowers your chances of successfully quitting.  Reduce the amount of caffeine you consume. Once you quit smoking, the amount of caffeine in your body increases and can give you symptoms, such as a rapid heartbeat, sweating, and anxiety.  Avoid smokers because they can make you want to smoke.  Do not let weight gain distract you. Many smokers will gain weight when they quit, usually less than 10 pounds. Eat a healthy diet and stay active. You can always lose the weight gained after you quit.  Find ways to improve your mood other than smoking. FOR MORE INFORMATION  www.smokefree.gov  Document Released: 09/07/2001 Document Revised: 01/28/2014 Document Reviewed: 12/23/2011 [employer] Patient Information 2015 [employer]. This information is not intended to replace advice given to you by your health care provider. Make sure you discuss any questions you have with your health care provider.    Smoking Cessation, Tips for Success If you are ready to quit smoking, congratulations! You have chosen to help yourself be healthier. Cigarettes bring nicotine, tar, carbon monoxide, and other irritants into your body. Your lungs, heart, and blood vessels will be able to work better without these poisons. There are many different ways to quit smoking. Nicotine gum, nicotine patches, a nicotine inhaler, or nicotine nasal spray can help with physical craving. Hypnosis, support groups, and medicines help break the habit of smoking. WHAT THINGS CAN I DO TO MAKE QUITTING EASIER?  Here are some tips to help you quit for good:  Pick a date when you will quit smoking completely. Tell all of your friends and family about your plan to quit on that date.  Do not try to slowly cut down on the number of cigarettes you are smoking. Pick a quit date and quit smoking completely starting on that  day.  Throw away all cigarettes.   Clean and remove all ashtrays from your home, work, and car.  On a card, write down your reasons for quitting. Carry the card with you and read it when you get the urge to smoke.  Cleanse your body of nicotine. Drink enough water and fluids to keep your urine clear or pale yellow. Do this after quitting to flush the nicotine from   your body.  Learn to predict your moods. Do not let a bad situation be your excuse to have a cigarette. Some situations in your life might tempt you into wanting a cigarette.  Never have "just one" cigarette. It leads to wanting another and another. Remind yourself of your decision to quit.  Change habits associated with smoking. If you smoked while driving or when feeling stressed, try other activities to replace smoking. Stand up when drinking your coffee. Brush your teeth after eating. Sit in a different chair when you read the paper. Avoid alcohol while trying to quit, and try to drink fewer caffeinated beverages. Alcohol and caffeine may urge you to smoke.  Avoid foods and drinks that can trigger a desire to smoke, such as sugary or spicy foods and alcohol.  Ask people who smoke not to smoke around you.  Have something planned to do right after eating or having a cup of coffee. For example, plan to take a walk or exercise.  Try a relaxation exercise to calm you down and decrease your stress. Remember, you may be tense and nervous for the first 2 weeks after you quit, but this will pass.  Find new activities to keep your hands busy. Play with a pen, coin, or rubber band. Doodle or draw things on paper.  Brush your teeth right after eating. This will help cut down on the craving for the taste of tobacco after meals. You can also try mouthwash.   Use oral substitutes in place of cigarettes. Try using lemon drops, carrots, cinnamon sticks, or chewing gum. Keep them handy so they are available when you have the urge to  smoke.  When you have the urge to smoke, try deep breathing.  Designate your home as a nonsmoking area.  If you are a heavy smoker, ask your health care provider about a prescription for nicotine chewing gum. It can ease your withdrawal from nicotine.  Reward yourself. Set aside the cigarette money you save and buy yourself something nice.  Look for support from others. Join a support group or smoking cessation program. Ask someone at home or at work to help you with your plan to quit smoking.  Always ask yourself, "Do I need this cigarette or is this just a reflex?" Tell yourself, "Today, I choose not to smoke," or "I do not want to smoke." You are reminding yourself of your decision to quit.  Do not replace cigarette smoking with electronic cigarettes (commonly called e-cigarettes). The safety of e-cigarettes is unknown, and some may contain harmful chemicals.  If you relapse, do not give up! Plan ahead and think about what you will do the next time you get the urge to smoke. HOW WILL I FEEL WHEN I QUIT SMOKING? You may have symptoms of withdrawal because your body is used to nicotine (the addictive substance in cigarettes). You may crave cigarettes, be irritable, feel very hungry, cough often, get headaches, or have difficulty concentrating. The withdrawal symptoms are only temporary. They are strongest when you first quit but will go away within 10-14 days. When withdrawal symptoms occur, stay in control. Think about your reasons for quitting. Remind yourself that these are signs that your body is healing and getting used to being without cigarettes. Remember that withdrawal symptoms are easier to treat than the major diseases that smoking can cause.  Even after the withdrawal is over, expect periodic urges to smoke. However, these cravings are generally short lived and will go away whether you   smoke or not. Do not smoke! WHAT RESOURCES ARE AVAILABLE TO HELP ME QUIT SMOKING? Your health care  provider can direct you to community resources or hospitals for support, which may include:  Group support.  Education.  Hypnosis.  Therapy. Document Released: 06/11/2004 Document Revised: 01/28/2014 Document Reviewed: 03/01/2013 [employer] Patient Information 2015 [employer]. This information is not intended to replace advice given to you by your health care provider. Make sure you discuss any questions you have with your health care provider.  

## 2015-03-06 NOTE — Progress Notes (Signed)
[facility] [place] HISTORY AND PHYSICAL -PAD  History of Present Illness Todd [name] is a 54 y.o. male patient of Dr. Oneida Alar who is status post right femoral to below-knee popliteal bypass with non-reversed greater saphenous vein on 08/12/2014. He had peroneal and anterior tibial artery runoff. The patient states that his right foot is significantly improved. All of his incisions are completely healed. He previously had a left femoral to above-knee popliteal bypass with PTFE and vein patch proximally and distally in 2013. He had single vessel anterior tibial runoff to the left foot. He has no symptoms of claudication rest pain or ulcers in either foot at this point.  Unfortunately he continues to smoke. He denies shortness of breath. He denies chest pain.  Pt states he had a stroke in 2008 as manifested by right hemiparesis, still has mild right arm residual weakness, he also had speech difficulties which resolved after a year; he denies monocular vision changes. Only carotid Duplex result on file is from 2008: Mild bilateral carotid bifurcation plaque without hemodynamically significant stenosis. Pt states his ophthalmologist in [place] ordered a more recent carotid Duplex that he had done in January or February 2016, results not on file.  Pt still sees a cardiologist but does not recall name, states he had a stress test about November 2015.  The patient denies New Medical or Surgical History.  Pt Diabetic: No Pt smoker: smoker  (1.5 ppd, started at age 28 yrs, quit from 2009 to 2013)  Pt meds include: Statin :No, states his doctor told him he no longer needs Betablocker: Yes ASA: yes Other anticoagulants/antiplatelets: no  Past Medical History  Diagnosis Date  . Hypertension   . Hyperlipidemia   . Peripheral vascular disease     a. bilateral superficial femoral artery stents in May/August of 2012. b. Left femoral to above-knee popliteal bypass in 08/2012.  .  Tobacco abuse   . CAD (coronary artery disease)     a. Cath 07/2012 following abnormal nuclear stress test: 20-30% LAD, 20-30% OM1, occluded branch off OM2 that fills slowly via collaterals, small nondominant RCA occluded proximally, fills slowly via collaterals.  . CKD (chronic kidney disease), stage II   . Renal insufficiency   . Stroke     2008 right side weakness  . Depression     Social History History  Substance Use Topics  . Smoking status: Current Every Day Smoker -- 0.50 packs/day for 25 years    Types: Cigarettes  . Smokeless tobacco: Never Used     Comment: pt states that he does not smoke daily and only smokes when he can get them  . Alcohol Use: No    Family History Family History  Problem Relation Age of Onset  . Diabetes Mother   . Heart disease Mother     before age 74- Triple BPG  . Cancer Mother     Luekemia  . Deep vein thrombosis Mother   . Hyperlipidemia Mother   . Hypertension Mother   . Heart attack Mother   . Peripheral vascular disease Mother   . Stroke Mother     x 3  . Arthritis Father     Past Surgical History  Procedure Laterality Date  . Stents in bil legs    . Colonoscopy    . Femoral artery - popliteal artery bypass graft    . Femoral-popliteal bypass graft  09/26/2012    Procedure: BYPASS GRAFT FEMORAL-POPLITEAL ARTERY;  Surgeon: Elam Dutch,  MD;  Location: [facility] OR;  Service: Vascular;  Laterality: Left;  using 67mm Gore-Tex Propaten Ringed Graft, Vein patch on the proximal and distal anastomosis  . Pr vein bypass graft,aorto-fem-pop    . Cardiac catheterization    . Femoral-popliteal bypass graft Right 08/12/2014    Procedure:  FEMORAL-POPLITEAL ARTERY BYPASS WITH SAPHENOUS VEIN GRAFT, INTRAOPERATIVE ARTERIOGRAM;  Surgeon: Elam Dutch, MD;  Location: [facility];  Service: Vascular;  Laterality: Right;  . Intraoperative arteriogram  08/12/2014    Procedure: INTRA OPERATIVE ARTERIOGRAM;  Surgeon: Elam Dutch, MD;  Location: [facility];  Service: Vascular;;  . Abdominal aortagram N/A 04/27/2012    Procedure: ABDOMINAL Maxcine Ham;  Surgeon: Conrad Old Bethpage, MD;  Location: [facility] CATH LAB;  Service: Cardiovascular;  Laterality: N/A;  . Abdominal aortagram N/A 08/09/2014    Procedure: ABDOMINAL Maxcine Ham;  Surgeon: Elam Dutch, MD;  Location: [facility] CATH LAB;  Service: Cardiovascular;  Laterality: N/A;    No Known Allergies  Current Outpatient Prescriptions  Medication Sig Dispense Refill  . aspirin EC 81 MG tablet Take 81 mg by mouth daily.    . metoprolol succinate (TOPROL XL) 25 MG 24 hr tablet Take 2 tablets (50 mg total) by mouth daily. 30 tablet 6  . rosuvastatin (CRESTOR) 10 MG tablet Take 10 mg by mouth at bedtime.     . valACYclovir (VALTREX) 500 MG tablet Take 500 mg by mouth at bedtime.    Marland Kitchen acetaminophen-codeine (TYLENOL #3) 300-30 MG per tablet Take 1-2 tablets by mouth every 8 (eight) hours as needed for moderate pain. (Patient not taking: Reported on 03/06/2015) 10 tablet 0  . oxyCODONE (OXY IR/ROXICODONE) 5 MG immediate release tablet Take 1-2 tablets (5-10 mg total) by mouth every 6 (six) hours as needed for moderate pain. (Patient not taking: Reported on 12/05/2014) 40 tablet 0   No current facility-administered medications for this visit.    ROS: See HPI for pertinent positives and negatives.   Physical Examination  Filed Vitals:   03/06/15 1154  BP: 144/89  Pulse: 61  Resp: 16  Height: 6\' 1"  (1.854 m)  Weight: 195 lb (88.451 kg)  SpO2: 100%   Body mass index is 25.73 kg/(m^2).  General: A&O x 3, WDWN. Gait: normal Eyes: PERRLA. Pulmonary: CTAB with slightly decreased air movement, without wheezes , rales or rhonchi. Cardiac: regular Rhythm, no detected murmur.         Carotid Bruits Right Left   Positive Negative  Aorta is not palpable. Radial pulses: 2+ palpable and =                           VASCULAR EXAM: Extremities without ischemic changes. without Gangrene; without open wounds.                                                                                                           LE Pulses Right Left       FEMORAL  3+ palpable  3+ palpable  POPLITEAL  2+ palpable   2+ palpable       POSTERIOR TIBIAL  not palpable   not palpable        DORSALIS PEDIS      ANTERIOR TIBIAL 2+ palpable  2+ palpable     Abdomen: soft, NT, no palpable masses. Skin: no rashes, no ulcers. Musculoskeletal: no muscle wasting or atrophy.  Neurologic: A&O X 3; Appropriate Affect,  MOTOR FUNCTION:  moving all extremities equally, motor strength 5/5 throughout. Speech is fluent/normal. CN 2-12 intact.    Non-Invasive Vascular Imaging: DATE: 03/06/2015 LOWER EXTREMITY ARTERIAL DUPLEX EVALUATION    INDICATION: Follow-up bilateral lower extremity bypass graft     PREVIOUS INTERVENTION(S): Right femoropopliteal arterial bypass graft placed 08/12/2014 Left femoropopliteal arterial bypass graft placed12/31/2013     DUPLEX EXAM:     RIGHT  LEFT   Peak Systolic Velocity (cm/s) Ratio (if abnormal) Waveform  Peak Systolic Velocity (cm/s) Ratio (if abnormal) Waveform  157  T Inflow Artery 118  T  111  T Proximal Anastomosis 108  T    T Proximal Graft 67  T  70  T Mid Graft 86  T  56  T  Distal Graft 84  T  60  T Distal Anastomosis 69  T  63  T Outflow Artery 52  T  1.07 Today's ABI / TBI 0.94  1.05 Previous ABI / TBI (12/05/2014 ) 1.03    Waveform:    M - Monophasic       B - Biphasic       T - Triphasic  If Ankle Brachial Index (ABI) or Toe Brachial Index (TBI) performed, please see complete report  ADDITIONAL FINDINGS: Right proximal graft was evaluated but inadvertently not recorded to the exam.    IMPRESSION: Widely patent bilateral femoropopliteal arterial bypass graft without evidence of restenosis or hyperplasia.     Compared to the previous exam:  No significant change compared to prior exam.      ASSESSMENT: Todd [name] is a 54 y.o. male who is status post right  femoral to below-knee popliteal bypass with non-reversed greater saphenous vein on 08/12/2014. He had peroneal and anterior tibial artery runoff. The patient states that his right foot is significantly improved. All of his incisions are completely healed. He previously had a left femoral to above-knee popliteal bypass with PTFE and vein patch proximally and distally in 2013. He had single vessel anterior tibial runoff to the left foot. He has no symptoms of claudication rest pain or ulcers in either foot at this point. Today's bilateral lower extremity arterial Duplex suggests widely patent bilateral femoropopliteal arterial bypass graft without evidence of restenosis or hyperplasia. No significant change compared to prior exam on 12/05/14.  Fortunately he does not have diabetes but unfortunately he continues to smoke, see Plan.   PLAN:  The patient was counseled re smoking cessation and given several free resources re smoking cessation.  Based on the patient's vascular studies and examination, pt will return to clinic in 3 months with ABI's and bilateral LE arterial Duplex.   I discussed in depth with the patient the nature of atherosclerosis, and emphasized the importance of maximal medical management including strict control of blood pressure, blood glucose, and lipid levels, obtaining regular exercise, and cessation of smoking.  The patient is aware that without maximal medical management the underlying atherosclerotic disease process will progress, limiting the benefit of any interventions.  The patient was given information about PAD including signs,  symptoms, treatment, what symptoms should prompt the patient to seek immediate medical care, and risk reduction measures to take.  Clemon Chambers, RN, MSN, FNP-C [facility] Phone: [phone]  Clinic MD: [facility]  03/06/2015 12:23 PM

## 2015-03-07 NOTE — Addendum Note (Signed)
Addended by: Dorthula Rue L on: 03/07/2015 02:07 PM   Modules accepted: Orders

## 2015-05-05 ENCOUNTER — Encounter: Payer: Self-pay | Admitting: Cardiology

## 2015-05-05 ENCOUNTER — Ambulatory Visit (INDEPENDENT_AMBULATORY_CARE_PROVIDER_SITE_OTHER): Payer: Medicare Other | Admitting: Cardiology

## 2015-05-05 ENCOUNTER — Ambulatory Visit: Payer: Medicare Other | Admitting: Cardiology

## 2015-05-05 VITALS — BP 166/94 | HR 59 | Ht 73.0 in | Wt 197.0 lb

## 2015-05-05 DIAGNOSIS — N182 Chronic kidney disease, stage 2 (mild): Secondary | ICD-10-CM | POA: Diagnosis not present

## 2015-05-05 DIAGNOSIS — E785 Hyperlipidemia, unspecified: Secondary | ICD-10-CM

## 2015-05-05 DIAGNOSIS — Z5181 Encounter for therapeutic drug level monitoring: Secondary | ICD-10-CM

## 2015-05-05 DIAGNOSIS — Z13220 Encounter for screening for lipoid disorders: Secondary | ICD-10-CM

## 2015-05-05 DIAGNOSIS — Z136 Encounter for screening for cardiovascular disorders: Secondary | ICD-10-CM

## 2015-05-05 DIAGNOSIS — I2510 Atherosclerotic heart disease of native coronary artery without angina pectoris: Secondary | ICD-10-CM | POA: Diagnosis not present

## 2015-05-05 DIAGNOSIS — Z720 Tobacco use: Secondary | ICD-10-CM

## 2015-05-05 DIAGNOSIS — Z1329 Encounter for screening for other suspected endocrine disorder: Secondary | ICD-10-CM

## 2015-05-05 DIAGNOSIS — I10 Essential (primary) hypertension: Secondary | ICD-10-CM | POA: Diagnosis not present

## 2015-05-05 DIAGNOSIS — I739 Peripheral vascular disease, unspecified: Secondary | ICD-10-CM

## 2015-05-05 MED ORDER — VARENICLINE TARTRATE 0.5 MG X 11 & 1 MG X 42 PO MISC: ORAL | Status: DC

## 2015-05-05 MED ORDER — ROSUVASTATIN CALCIUM 10 MG PO TABS: 10.0000 mg | ORAL_TABLET | Freq: Every day | ORAL | Status: DC

## 2015-05-05 MED ORDER — LISINOPRIL 10 MG PO TABS: 10.0000 mg | ORAL_TABLET | Freq: Every day | ORAL | Status: DC

## 2015-05-05 NOTE — Progress Notes (Signed)
Clinical Summary Mr. Roesler is a 54 y.o.male seen today for follow up of the following medical problems. He was last seen by PA Dunn, this is our first visit together.   1. HTN - compliant with meds - does not check bp at home  2. HL - not on statin, reports he thinks one of his doctors stopped it however he is not sure of any specific reason.   3. PAD - hx of bilateral SFA stents in 2012, left femoral to above knee popliteteal bypass 2013 and right femoral to below knee bypass 07/2014 - no recent leg pain, no foot sores.  4. CAD - cath 2013 with patent vessels other than small OM Karis Rilling that was occluded and an occluded small non-dominant RCA. LVEF by LVgram 65%.  - Patient has small area of ischemia on MPI 07/2014 related to this disease that has been managed medically.  - no chest pain, no SOB or DOE - compliant with meds   5. CKD stage II  6. Tobacco abuse - had success with chantix before a few years ago, however has recently restarted smoking   Past Medical History  Diagnosis Date  . Hypertension   . Hyperlipidemia   . Peripheral vascular disease     a. bilateral superficial femoral artery stents in May/August of 2012. b. Left femoral to above-knee popliteal bypass in 08/2012.  . Tobacco abuse   . CAD (coronary artery disease)     a. Cath 07/2012 following abnormal nuclear stress test: 20-30% LAD, 20-30% OM1, occluded Lovell Roe off OM2 that fills slowly via collaterals, small nondominant RCA occluded proximally, fills slowly via collaterals.  . CKD (chronic kidney disease), stage II   . Renal insufficiency   . Stroke     2008 right side weakness  . Depression      No Known Allergies   Current Outpatient Prescriptions  Medication Sig Dispense Refill  . acetaminophen-codeine (TYLENOL #3) 300-30 MG per tablet Take 1-2 tablets by mouth every 8 (eight) hours as needed for moderate pain. (Patient not taking: Reported on 03/06/2015) 10 tablet 0  . aspirin EC 81 MG  tablet Take 81 mg by mouth daily.    . metoprolol succinate (TOPROL XL) 25 MG 24 hr tablet Take 2 tablets (50 mg total) by mouth daily. 30 tablet 6  . oxyCODONE (OXY IR/ROXICODONE) 5 MG immediate release tablet Take 1-2 tablets (5-10 mg total) by mouth every 6 (six) hours as needed for moderate pain. (Patient not taking: Reported on 12/05/2014) 40 tablet 0  . rosuvastatin (CRESTOR) 10 MG tablet Take 10 mg by mouth at bedtime.     . valACYclovir (VALTREX) 500 MG tablet Take 500 mg by mouth at bedtime.     No current facility-administered medications for this visit.     Past Surgical History  Procedure Laterality Date  . Stents in bil legs    . Colonoscopy    . Femoral artery - popliteal artery bypass graft    . Femoral-popliteal bypass graft  09/26/2012    Procedure: BYPASS GRAFT FEMORAL-POPLITEAL ARTERY;  Surgeon: Elam Dutch, MD;  Location: [facility] OR;  Service: Vascular;  Laterality: Left;  using 21mm Gore-Tex Propaten Ringed Graft, Vein patch on the proximal and distal anastomosis  . Pr vein bypass graft,aorto-fem-pop    . Cardiac catheterization    . Femoral-popliteal bypass graft Right 08/12/2014    Procedure:  FEMORAL-POPLITEAL ARTERY BYPASS WITH SAPHENOUS VEIN GRAFT, INTRAOPERATIVE ARTERIOGRAM;  Surgeon: Elam Dutch,  MD;  Location: [place];  Service: Vascular;  Laterality: Right;  . Intraoperative arteriogram  08/12/2014    Procedure: INTRA OPERATIVE ARTERIOGRAM;  Surgeon: Elam Dutch, MD;  Location: [place];  Service: Vascular;;  . Abdominal aortagram N/A 04/27/2012    Procedure: ABDOMINAL Maxcine Ham;  Surgeon: Conrad Louisburg, MD;  Location: [facility] CATH LAB;  Service: Cardiovascular;  Laterality: N/A;  . Abdominal aortagram N/A 08/09/2014    Procedure: ABDOMINAL Maxcine Ham;  Surgeon: Elam Dutch, MD;  Location: [facility] CATH LAB;  Service: Cardiovascular;  Laterality: N/A;     No Known Allergies    Family History  Problem Relation Age of Onset  . Diabetes Mother   . Heart  disease Mother     before age 67- Triple BPG  . Cancer Mother     Luekemia  . Deep vein thrombosis Mother   . Hyperlipidemia Mother   . Hypertension Mother   . Heart attack Mother   . Peripheral vascular disease Mother   . Stroke Mother     x 3  . Arthritis Father      Social History Mr. Solem reports that he has been smoking Cigarettes.  He has a 12.5 pack-year smoking history. He has never used smokeless tobacco. Mr. Wollman reports that he does not drink alcohol.   Review of Systems CONSTITUTIONAL: No weight loss, fever, chills, weakness or fatigue.  HEENT: Eyes: No visual loss, blurred vision, double vision or yellow sclerae.No hearing loss, sneezing, congestion, runny nose or sore throat.  SKIN: No rash or itching.  CARDIOVASCULAR: per HPI RESPIRATORY: No shortness of breath, cough or sputum.  GASTROINTESTINAL: No anorexia, nausea, vomiting or diarrhea. No abdominal pain or blood.  GENITOURINARY: No burning on urination, no polyuria NEUROLOGICAL: No headache, dizziness, syncope, paralysis, ataxia, numbness or tingling in the extremities. No change in bowel or bladder control.  MUSCULOSKELETAL: No muscle, back pain, joint pain or stiffness.  LYMPHATICS: No enlarged nodes. No history of splenectomy.  PSYCHIATRIC: No history of depression or anxiety.  ENDOCRINOLOGIC: No reports of sweating, cold or heat intolerance. No polyuria or polydipsia.  Marland Kitchen   Physical Examination Filed Vitals:   05/05/15 1537  BP: 166/94  Pulse: 59  . Filed Vitals:   05/05/15 1537  Height: 6\' 1"  (1.854 m)  Weight: 197 lb (89.359 kg)    Gen: resting comfortably, no acute distress HEENT: no scleral icterus, pupils equal round and reactive, no palptable cervical adenopathy,  CV: RRR, no m/r/g, no JVD Resp: Clear to auscultation bilaterally GI: abdomen is soft, non-tender, non-distended, normal bowel sounds, no hepatosplenomegaly MSK: extremities are warm, no edema.  Skin: warm, no rash Neuro:   no focal deficits Psych: appropriate affect   Diagnostic Studies 07/2012 Cath Hemodynamics:   LV pressure: 115/23 Aortic pressure: 111/69  Angiography   Left Main: LM normal  Left anterior Descending: large, minor luminal irregularities. 20-30% stenosis diffusely. Several small diagonals which have only minor luminal irreg.  Left Circumflex: large and dominant, 20-30%, OM 1 is small and normal, OM2 is a small vessel. There is a small Mayce Noyes off the OM2 that is occluded and fills slowly via collaterals. PDA is unremarkable  Right Coronary Artery: small , nondominant, occluded Proximally, fills slowly via collaterals  LV Gram: overall normal LV function . EF 23%  Complications: No apparent complications Patient did tolerate procedure well.  Contrast used: 110 CC  Conclusions:  1. Mild- moderate irregularities. The large LAD and the large dominant LCx have only minor  luminal irregularities. He has a small OM Clarissia Mckeen that is occluded and a small non dominant RCA that is also occluded. These are likely the source of his abnormal myoview.  2. Normal LV function.   He should be at low risk for his upcoming vascular surgery.   07/2014 MPI IMPRESSION: 1. Abnormal study. There is a small region of ischemia in the inferolateral wall which would be reasonably consistent with the patient's known history of CAD.  2. Normal left ventricular wall motion.  3. Left ventricular ejection fraction 61%  4. Low-risk stress test findings*.     Assessment and Plan  1. HTN - above goal. Givne his CAD and PAD, wills start lisionpril 10mg  daily for additional cardiovascular beneftis  2. HL - unclear why he is off his statin. Given his CAD and PAD he should be on high dose statin - will restart crestor 10 mg, his previous statin. Potentially titrate to 20mg  daily if tolerated.   3. PAD - continue to follow with vascular. No current symptoms  4. CAD - no current  symptoms - continue current meds  5. CKD stage II - not at goal, given history of CKD goal <130/80 - start ACE-I, repeat labs in 2 weeks.   6. Tobacco abuse - Rx for chantix given, he has had success with this before.   F/u 6 weeks.   Arnoldo Lenis, M.D.

## 2015-05-05 NOTE — Patient Instructions (Signed)
Your physician has recommended you make the following change in your medication:  Start lisinopril 10 mg daily. Start crestor 10 mg daily. Start chantix 0.5 starter pak. Continue all other medications the same. Your physician recommends that you return for a FASTING lipid profile, CMET, CBC, TSH, HgA1C, and Mg level in 2 weeks around 05/19/15. Your physician recommends that you schedule a follow-up appointment in: 6 weeks.

## 2015-05-19 ENCOUNTER — Telehealth: Payer: Self-pay | Admitting: *Deleted

## 2015-05-19 ENCOUNTER — Encounter: Payer: Self-pay | Admitting: *Deleted

## 2015-05-19 NOTE — Telephone Encounter (Signed)
Opened in error

## 2015-05-30 ENCOUNTER — Telehealth: Payer: Self-pay | Admitting: *Deleted

## 2015-05-30 ENCOUNTER — Encounter: Payer: Self-pay | Admitting: *Deleted

## 2015-05-30 NOTE — Telephone Encounter (Signed)
-----   Message from Arnoldo Lenis, MD sent at 05/26/2015 11:16 AM EDT ----- Labs overall look good except cholesterol is too high. Now that he is back on crestor it should improve  Zandra Abts MD

## 2015-05-30 NOTE — Telephone Encounter (Signed)
LM X 3 days, mailed pt letter, routed to pcp.

## 2015-06-12 ENCOUNTER — Encounter: Payer: Self-pay | Admitting: Cardiology

## 2015-06-12 ENCOUNTER — Ambulatory Visit (INDEPENDENT_AMBULATORY_CARE_PROVIDER_SITE_OTHER): Payer: Medicare Other | Admitting: Cardiology

## 2015-06-12 VITALS — BP 160/97 | HR 75 | Ht 73.0 in | Wt 203.0 lb

## 2015-06-12 DIAGNOSIS — I2510 Atherosclerotic heart disease of native coronary artery without angina pectoris: Secondary | ICD-10-CM

## 2015-06-12 DIAGNOSIS — N182 Chronic kidney disease, stage 2 (mild): Secondary | ICD-10-CM | POA: Diagnosis not present

## 2015-06-12 DIAGNOSIS — I10 Essential (primary) hypertension: Secondary | ICD-10-CM

## 2015-06-12 DIAGNOSIS — I739 Peripheral vascular disease, unspecified: Secondary | ICD-10-CM | POA: Diagnosis not present

## 2015-06-12 MED ORDER — LISINOPRIL 20 MG PO TABS: 20.0000 mg | ORAL_TABLET | Freq: Every day | ORAL | Status: DC

## 2015-06-12 NOTE — Progress Notes (Signed)
Patient ID: BARRON BINNER, male   DOB: 1961/05/16, 54 y.o.   MRN: [id number]     Clinical Summary Mr. Delrosario is a 54 y.o.male seen today for follow up of the following medical problems.   1. HTN - compliant with meds - does not check bp at home - started lisinopril last visit, repeat labs show stable K and Cr  2. HL - compliant with statin  3. PAD - hx of bilateral SFA stents in 2012, left femoral to above knee popliteteal bypass 2013 and right femoral to below knee bypass 07/2014 - no recent leg pain, no foot sores.  4. CAD - cath 2013 with patent vessels other than small OM Arieana Somoza that was occluded and an occluded small non-dominant RCA. LVEF by LVgram 65%.  - Patient has small area of ischemia on MPI 07/2014 related to this disease that has been managed medically.   - no recent chest pain or SOB  5. CKD stage II  6. Tobacco abuse - reports he has stopped smoking Past Medical History  Diagnosis Date  . Hypertension   . Hyperlipidemia   . Peripheral vascular disease     a. bilateral superficial femoral artery stents in May/August of 2012. b. Left femoral to above-knee popliteal bypass in 08/2012.  . Tobacco abuse   . CAD (coronary artery disease)     a. Cath 07/2012 following abnormal nuclear stress test: 20-30% LAD, 20-30% OM1, occluded Manahil Vanzile off OM2 that fills slowly via collaterals, small nondominant RCA occluded proximally, fills slowly via collaterals.  . CKD (chronic kidney disease), stage II   . Renal insufficiency   . Stroke     2008 right side weakness  . Depression      No Known Allergies   Current Outpatient Prescriptions  Medication Sig Dispense Refill  . acetaminophen-codeine (TYLENOL #3) 300-30 MG per tablet Take 1-2 tablets by mouth every 8 (eight) hours as needed for moderate pain. 10 tablet 0  . aspirin EC 81 MG tablet Take 81 mg by mouth daily.    Marland Kitchen lisinopril (PRINIVIL,ZESTRIL) 10 MG tablet Take 1 tablet (10 mg total) by mouth daily. 90  tablet 3  . metoprolol succinate (TOPROL XL) 25 MG 24 hr tablet Take 2 tablets (50 mg total) by mouth daily. 30 tablet 6  . rosuvastatin (CRESTOR) 10 MG tablet Take 1 tablet (10 mg total) by mouth daily. 90 tablet 3  . valACYclovir (VALTREX) 500 MG tablet Take 500 mg by mouth at bedtime.    . varenicline (CHANTIX STARTING MONTH PAK) 0.5 MG X 11 & 1 MG X 42 tablet Take one 0.5 mg tablet by mouth once daily for 3 days, then increase to one 0.5 mg tablet twice daily for 4 days, then increase to one 1 mg tablet twice daily. 53 tablet 0   No current facility-administered medications for this visit.     Past Surgical History  Procedure Laterality Date  . Stents in bil legs    . Colonoscopy    . Femoral artery - popliteal artery bypass graft    . Femoral-popliteal bypass graft  09/26/2012    Procedure: BYPASS GRAFT FEMORAL-POPLITEAL ARTERY;  Surgeon: Sherren Kerns, MD;  Location: [facility] OR;  Service: Vascular;  Laterality: Left;  using 6mm Gore-Tex Propaten Ringed Graft, Vein patch on the proximal and distal anastomosis  . Pr vein bypass graft,aorto-fem-pop    . Cardiac catheterization    . Femoral-popliteal bypass graft Right 08/12/2014    Procedure:  FEMORAL-POPLITEAL  ARTERY BYPASS WITH SAPHENOUS VEIN GRAFT, INTRAOPERATIVE ARTERIOGRAM;  Surgeon: Sherren Kerns, MD;  Location: [facility] OR;  Service: Vascular;  Laterality: Right;  . Intraoperative arteriogram  08/12/2014    Procedure: INTRA OPERATIVE ARTERIOGRAM;  Surgeon: Sherren Kerns, MD;  Location: [facility] OR;  Service: Vascular;;  . Abdominal aortagram N/A 04/27/2012    Procedure: ABDOMINAL Ronny Flurry;  Surgeon: Fransisco Hertz, MD;  Location: [facility] CATH LAB;  Service: Cardiovascular;  Laterality: N/A;  . Abdominal aortagram N/A 08/09/2014    Procedure: ABDOMINAL Ronny Flurry;  Surgeon: Sherren Kerns, MD;  Location: [facility] CATH LAB;  Service: Cardiovascular;  Laterality: N/A;     No Known Allergies    Family History  Problem Relation Age of Onset  .  Diabetes Mother   . Heart disease Mother     before age 74- Triple BPG  . Cancer Mother     Luekemia  . Deep vein thrombosis Mother   . Hyperlipidemia Mother   . Hypertension Mother   . Heart attack Mother   . Peripheral vascular disease Mother   . Stroke Mother     x 3  . Arthritis Father      Social History Mr. Sitzer reports that he has been smoking Cigarettes.  He started smoking about 40 years ago. He has a 12.5 pack-year smoking history. He has never used smokeless tobacco. Mr. Raiola reports that he does not drink alcohol.   Review of Systems CONSTITUTIONAL: No weight loss, fever, chills, weakness or fatigue.  HEENT: Eyes: No visual loss, blurred vision, double vision or yellow sclerae.No hearing loss, sneezing, congestion, runny nose or sore throat.  SKIN: No rash or itching.  CARDIOVASCULAR: per HPI RESPIRATORY: No shortness of breath, cough or sputum.  GASTROINTESTINAL: No anorexia, nausea, vomiting or diarrhea. No abdominal pain or blood.  GENITOURINARY: No burning on urination, no polyuria NEUROLOGICAL: No headache, dizziness, syncope, paralysis, ataxia, numbness or tingling in the extremities. No change in bowel or bladder control.  MUSCULOSKELETAL: No muscle, back pain, joint pain or stiffness.  LYMPHATICS: No enlarged nodes. No history of splenectomy.  PSYCHIATRIC: No history of depression or anxiety.  ENDOCRINOLOGIC: No reports of sweating, cold or heat intolerance. No polyuria or polydipsia.  Marland Kitchen   Physical Examination Filed Vitals:   06/12/15 1008  BP: 160/97  Pulse: 75   Filed Vitals:   06/12/15 1008  Height: 6\' 1"  (1.854 m)  Weight: 203 lb (92.08 kg)    Gen: resting comfortably, no acute distress HEENT: no scleral icterus, pupils equal round and reactive, no palptable cervical adenopathy,  CV: RRR, no m/r/g, no JVD Resp: Clear to auscultation bilaterally GI: abdomen is soft, non-tender, non-distended, normal bowel sounds, no hepatosplenomegaly MSK:  extremities are warm, no edema.  Skin: warm, no rash Neuro:  no focal deficits Psych: appropriate affect   Diagnostic Studies 07/2012 Cath Hemodynamics:   LV pressure: 115/23 Aortic pressure: 111/69  Angiography   Left Main: LM normal  Left anterior Descending: large, minor luminal irregularities. 20-30% stenosis diffusely. Several small diagonals which have only minor luminal irreg.  Left Circumflex: large and dominant, 20-30%, OM 1 is small and normal, OM2 is a small vessel. There is a small Keerat Denicola off the OM2 that is occluded and fills slowly via collaterals. PDA is unremarkable  Right Coronary Artery: small , nondominant, occluded Proximally, fills slowly via collaterals  LV Gram: overall normal LV function . EF 65%  Complications: No apparent complications Patient did tolerate procedure well.  Contrast used:  110 CC  Conclusions:  1. Mild- moderate irregularities. The large LAD and the large dominant LCx have only minor luminal irregularities. He has a small OM Sharia Averitt that is occluded and a small non dominant RCA that is also occluded. These are likely the source of his abnormal myoview.  2. Normal LV function.   He should be at low risk for his upcoming vascular surgery.   07/2014 MPI IMPRESSION: 1. Abnormal study. There is a small region of ischemia in the inferolateral wall which would be reasonably consistent with the patient's known history of CAD.  2. Normal left ventricular wall motion.  3. Left ventricular ejection fraction 61%  4. Low-risk stress test findings*.       Assessment and Plan  1. HTN - above goal, will increase his lisionrpil to 20mg  daily.  - he will keep bp log and submit in 1 week  2. HL -continue crestor, repeat lipid panel at next visit  3. PAD - continue to follow with vascular. No current symptoms  4. CAD - no current symptoms - continue current meds  5. CKD stage II   6. Tobacco abuse -  congratulated on recently stopping smoking.    F/u 3 months   Antoine Poche, M.D.

## 2015-06-12 NOTE — Patient Instructions (Addendum)
   Increase Lisinopril to 20mg  daily - may take 2 of your 10mg  tabs till finish current supply.  New 20mg  tablet sent to [facility] today. Continue all other medications.   Your physician has requested that you regularly monitor and record your blood pressure readings at home. Please take your readings approximately 2 hours after taking your medication.  Take readings daily for the next 7 days & return log to office for MD review. Follow up in  3 months.

## 2015-06-17 ENCOUNTER — Encounter: Payer: Self-pay | Admitting: Family

## 2015-06-19 ENCOUNTER — Ambulatory Visit (INDEPENDENT_AMBULATORY_CARE_PROVIDER_SITE_OTHER)
Admission: RE | Admit: 2015-06-19 | Discharge: 2015-06-19 | Disposition: A | Discharge status: Home / Self Care | Payer: Medicare Other | Source: Ambulatory Visit | Attending: Family | Admitting: Family

## 2015-06-19 ENCOUNTER — Encounter: Payer: Self-pay | Admitting: Family

## 2015-06-19 ENCOUNTER — Ambulatory Visit (INDEPENDENT_AMBULATORY_CARE_PROVIDER_SITE_OTHER): Payer: Medicare Other | Admitting: Family

## 2015-06-19 ENCOUNTER — Ambulatory Visit (HOSPITAL_COMMUNITY)
Admission: RE | Admit: 2015-06-19 | Discharge: 2015-06-19 | Disposition: A | Discharge status: Home / Self Care | Payer: Medicare Other | Source: Ambulatory Visit | Attending: Family | Admitting: Family

## 2015-06-19 VITALS — BP 182/121 | HR 69 | Temp 97.8°F | Resp 18 | Ht 73.0 in | Wt 198.7 lb

## 2015-06-19 DIAGNOSIS — Z9889 Other specified postprocedural states: Secondary | ICD-10-CM

## 2015-06-19 DIAGNOSIS — F17200 Nicotine dependence, unspecified, uncomplicated: Secondary | ICD-10-CM | POA: Insufficient documentation

## 2015-06-19 DIAGNOSIS — I779 Disorder of arteries and arterioles, unspecified: Secondary | ICD-10-CM

## 2015-06-19 DIAGNOSIS — E785 Hyperlipidemia, unspecified: Secondary | ICD-10-CM | POA: Insufficient documentation

## 2015-06-19 DIAGNOSIS — Z95828 Presence of other vascular implants and grafts: Secondary | ICD-10-CM

## 2015-06-19 DIAGNOSIS — Z720 Tobacco use: Secondary | ICD-10-CM | POA: Diagnosis not present

## 2015-06-19 DIAGNOSIS — R0989 Other specified symptoms and signs involving the circulatory and respiratory systems: Secondary | ICD-10-CM

## 2015-06-19 DIAGNOSIS — I10 Essential (primary) hypertension: Secondary | ICD-10-CM | POA: Diagnosis not present

## 2015-06-19 DIAGNOSIS — Z48812 Encounter for surgical aftercare following surgery on the circulatory system: Secondary | ICD-10-CM

## 2015-06-19 DIAGNOSIS — Z87891 Personal history of nicotine dependence: Secondary | ICD-10-CM

## 2015-06-19 DIAGNOSIS — IMO0001 Reserved for inherently not codable concepts without codable children: Secondary | ICD-10-CM

## 2015-06-19 DIAGNOSIS — R030 Elevated blood-pressure reading, without diagnosis of hypertension: Secondary | ICD-10-CM

## 2015-06-19 NOTE — Progress Notes (Signed)
[facility] [place] HISTORY AND PHYSICAL -PAD  History of Present Illness Todd [name] is a 54 y.o. male patient of Dr. Oneida Alar who is status post right femoral to below-knee popliteal bypass with non-reversed greater saphenous vein on 08/12/2014. He had peroneal and anterior tibial artery runoff. The patient states that his right foot is significantly improved. All of his incisions are completely healed. He previously had a left femoral to above-knee popliteal bypass with PTFE and vein patch proximally and distally in 2013. He had single vessel anterior tibial runoff to the left foot. He has no symptoms of claudication rest pain or ulcers in either foot at this point.  Unfortunately he continues to smoke. He denies shortness of breath. He denies chest pain.  Pt states he had a stroke in 2008 as manifested by right hemiparesis, still has mild right arm residual weakness, he also had speech difficulties which resolved after a year; he denies monocular vision changes. Only carotid Duplex result on file is from 2008: Mild bilateral carotid bifurcation plaque without hemodynamically significant stenosis. Pt states his ophthalmologist in [place] ordered a more recent carotid Duplex that he had done in January or February 2016, results not on file. Pt still sees a cardiologist but does not recall name, states he had a stress test about November 2015.  The patient reports New Medical or Surgical History: his cardiologist increased his lisinopril from 10 to 20 mg daily last week; started a statin two days ago.  Pt Diabetic: No Pt smoker: former smoker, quit in July 2016 ( started at age 77 yrs, quit from 2009 to 2013)  Pt meds include: Statin :yes Betablocker: Yes ASA: yes Other anticoagulants/antiplatelets: no     Past Medical History  Diagnosis Date  . Hypertension   . Hyperlipidemia   . Peripheral vascular disease     a. bilateral superficial femoral artery stents in  May/August of 2012. b. Left femoral to above-knee popliteal bypass in 08/2012.  . Tobacco abuse   . CAD (coronary artery disease)     a. Cath 07/2012 following abnormal nuclear stress test: 20-30% LAD, 20-30% OM1, occluded branch off OM2 that fills slowly via collaterals, small nondominant RCA occluded proximally, fills slowly via collaterals.  . CKD (chronic kidney disease), stage II   . Renal insufficiency   . Stroke     2008 right side weakness  . Depression     Social History Social History  Substance Use Topics  . Smoking status: Former Smoker -- 0.50 packs/day for 25 years    Types: Cigarettes    Start date: 11/16/1974    Quit date: 05/20/2015  . Smokeless tobacco: Never Used     Comment: pt states that he does not smoke daily and only smokes when he can get them  . Alcohol Use: No    Family History Family History  Problem Relation Age of Onset  . Diabetes Mother   . Heart disease Mother     before age 22- Triple BPG  . Cancer Mother     Luekemia  . Deep vein thrombosis Mother   . Hyperlipidemia Mother   . Hypertension Mother   . Heart attack Mother   . Peripheral vascular disease Mother   . Stroke Mother     x 3  . Arthritis Father     Past Surgical History  Procedure Laterality Date  . Stents in bil legs    . Colonoscopy    . Femoral artery -  popliteal artery bypass graft    . Femoral-popliteal bypass graft  09/26/2012    Procedure: BYPASS GRAFT FEMORAL-POPLITEAL ARTERY;  Surgeon: Elam Dutch, MD;  Location: [facility] OR;  Service: Vascular;  Laterality: Left;  using 74mm Gore-Tex Propaten Ringed Graft, Vein patch on the proximal and distal anastomosis  . Pr vein bypass graft,aorto-fem-pop    . Cardiac catheterization    . Femoral-popliteal bypass graft Right 08/12/2014    Procedure:  FEMORAL-POPLITEAL ARTERY BYPASS WITH SAPHENOUS VEIN GRAFT, INTRAOPERATIVE ARTERIOGRAM;  Surgeon: Elam Dutch, MD;  Location: [place];  Service: Vascular;  Laterality: Right;   . Intraoperative arteriogram  08/12/2014    Procedure: INTRA OPERATIVE ARTERIOGRAM;  Surgeon: Elam Dutch, MD;  Location: [place];  Service: Vascular;;  . Abdominal aortagram N/A 04/27/2012    Procedure: ABDOMINAL Maxcine Ham;  Surgeon: Conrad Pawnee, MD;  Location: [facility] CATH LAB;  Service: Cardiovascular;  Laterality: N/A;  . Abdominal aortagram N/A 08/09/2014    Procedure: ABDOMINAL Maxcine Ham;  Surgeon: Elam Dutch, MD;  Location: [facility] CATH LAB;  Service: Cardiovascular;  Laterality: N/A;    No Known Allergies  Current Outpatient Prescriptions  Medication Sig Dispense Refill  . aspirin EC 81 MG tablet Take 81 mg by mouth daily.    Marland Kitchen lisinopril (PRINIVIL,ZESTRIL) 20 MG tablet Take 1 tablet (20 mg total) by mouth daily. 30 tablet 6  . rosuvastatin (CRESTOR) 10 MG tablet Take 1 tablet (10 mg total) by mouth daily. 90 tablet 3  . valACYclovir (VALTREX) 500 MG tablet Take 500 mg by mouth at bedtime.     No current facility-administered medications for this visit.    ROS: See HPI for pertinent positives and negatives.   Physical Examination  Filed Vitals:   06/19/15 0953 06/19/15 0957  BP: 168/113 182/121  Pulse: 69   Temp: 97.8 F (36.6 C)   TempSrc: Oral   Resp: 18   Height: 6\' 1"  (1.854 m)   Weight: 198 lb 11.2 oz (90.13 kg)   SpO2: 99%    Body mass index is 26.22 kg/(m^2).  General: A&O x 3, WDWN. Gait: normal Eyes: PERRLA. Pulmonary: CTAB with slightly decreased air movement, without wheezes , rales or rhonchi. Cardiac: regular Rhythm, no detected murmur.     Carotid Bruits Right Left   Positive Negative  Aorta is not palpable. Radial pulses: 2+ palpable and =   VASCULAR EXAM: Extremities without ischemic changes. without Gangrene; without open wounds.     LE Pulses Right Left    FEMORAL 3+ palpable 3+ palpable    POPLITEAL 2+ palpable  2+ palpable   POSTERIOR TIBIAL not palpable  not palpable    DORSALIS PEDIS  ANTERIOR TIBIAL 2+ palpable  2+ palpable     Abdomen: soft, NT, no palpable masses. Skin: no rashes, no ulcers. Musculoskeletal: no muscle wasting or atrophy. Neurologic: A&O X 3; Appropriate Affect, MOTOR FUNCTION: moving all extremities equally, motor strength 5/5 throughout. Speech is fluent/normal. CN 2-12 intact.           Non-Invasive Vascular Imaging: DATE: 06/19/2015 LOWER EXTREMITY ARTERIAL DUPLEX EVALUATION    INDICATION: Follow-up right femoropopliteal arterial bypass graft     PREVIOUS INTERVENTION(S): Right femoropopliteal arterial bypass graft placed 08/12/2014 Left femoropopliteal arterial bypass graft placed 09/26/2012    DUPLEX EXAM:     RIGHT  LEFT   Peak Systolic Velocity (cm/s) Ratio (if abnormal) Waveform  Peak Systolic Velocity (cm/s) Ratio (if abnormal) Waveform  127  T Inflow Artery  125  T Proximal Anastomosis     60  T Proximal Graft     63  T Mid Graft     57  T  Distal Graft     79  T Distal Anastomosis     48  T Outflow Artery     1.07 Today's ABI / TBI 1.05  1.07 Previous ABI / TBI (03/06/2015  ) 0.94    Waveform:    M - Monophasic       B - Biphasic       T - Triphasic  If Ankle Brachial Index (ABI) or Toe Brachial Index (TBI) performed, please see complete report     ADDITIONAL FINDINGS:     IMPRESSION: Widely patent right femoropopliteal arterial bypass graft without evidence of restenosis or hyperplasia.     Compared to the previous exam:  No significant change compared to prior exam.      ASSESSMENT: Todd [name] is a 54 y.o. male who is status post right femoral to below-knee popliteal bypass with non-reversed greater saphenous vein on 08/12/2014 and left femoropopliteal arterial bypass graft placed 09/26/2012.  Today's right LE arterial  duplex suggests a widely patent right femoropopliteal arterial bypass graft without evidence of restenosis or hyperplasia. ABI's remain normal with all triphasic waveforms.   His blood pressure is very elevated at this time; pt states it has remained elevated since his medication was changed to lisinopril.  Manual repeat blood pressures at 10:25 am: RIGHT: 188/120, LEFT: 186/120. Pt denies feeling light headed, denies headache, denies dyspnea, denies chest pain; he states his blood pressure has been this high when he checks it at home for the last 1+ weeks. Pt's cardiologist who prescribed his lisinopril is 5 minutes away, his PCP is 30-45 minutes away. Pt advised to go directly to his cardiologist office now to have his blood pressure addressed.   PLAN:  Based on the patient's vascular studies and examination, pt will return to clinic in 6 months with ABI's, right LE arterial duplex.  I discussed in depth with the patient the nature of atherosclerosis, and emphasized the importance of maximal medical management including strict control of blood pressure, blood glucose, and lipid levels, obtaining regular exercise, and continued cessation of smoking.  The patient is aware that without maximal medical management the underlying atherosclerotic disease process will progress, limiting the benefit of any interventions.  The patient was given information about PAD including signs, symptoms, treatment, what symptoms should prompt the patient to seek immediate medical care, and risk reduction measures to take.  Clemon Chambers, RN, MSN, FNP-C [facility] Phone: [phone]  Clinic MD: [facility]  06/19/2015 10:13 AM

## 2015-06-19 NOTE — Patient Instructions (Signed)

## 2015-06-20 NOTE — Addendum Note (Signed)
Addended by: Thresa Ross C on: 06/20/2015 01:29 PM   Modules accepted: Orders

## 2015-07-02 ENCOUNTER — Encounter: Payer: Self-pay | Admitting: *Deleted

## 2015-07-23 ENCOUNTER — Encounter: Payer: Self-pay | Admitting: *Deleted

## 2015-07-23 ENCOUNTER — Telehealth: Payer: Self-pay | Admitting: *Deleted

## 2015-07-23 DIAGNOSIS — I10 Essential (primary) hypertension: Secondary | ICD-10-CM

## 2015-07-23 MED ORDER — LISINOPRIL 40 MG PO TABS: 40.0000 mg | ORAL_TABLET | Freq: Every day | ORAL | Status: DC

## 2015-07-23 NOTE — Telephone Encounter (Signed)
-----   Message from Arnoldo Lenis, MD sent at 07/23/2015 12:05 PM EDT ----- BP log reviewed, remains too high. Please increase lisinopril to 40mg  daily. Needs BMET in 2 weeks.   Zandra Abts MD

## 2015-07-23 NOTE — Telephone Encounter (Signed)
Pt aware and understood med change. Updated medication list, mailed lab orders.

## 2015-08-04 ENCOUNTER — Telehealth: Payer: Self-pay | Admitting: *Deleted

## 2015-08-04 MED ORDER — AMLODIPINE BESYLATE 5 MG PO TABS: 5.0000 mg | ORAL_TABLET | Freq: Every day | ORAL | Status: DC

## 2015-08-04 NOTE — Telephone Encounter (Signed)
-----   Message from Arnoldo Lenis, MD sent at 08/04/2015  1:00 PM EST ----- BP still too high. Would start norvasc 5mg  daily. Keep bp log x 2 weeks and then call us back.   Zandra Abts MD ----- Message -----    From: Massie Maroon, CMA    Sent: 08/01/2015   1:39 PM      To: Arnoldo Lenis, MD  Pt dropped off BP readings: pt due for labs next week 10/30         147/99       HR 71 10/31         127/90             78 11/1           172/109           70 11/2           162/99             71 11/3           167/105           75 11/4           151/99             74  Staci

## 2015-08-04 NOTE — Telephone Encounter (Signed)
Pt agreeable to starting amlodipine 5 mg daily and keeping BP log for 2 weeks. Will f/u, medication sent to pharmacy.

## 2015-08-11 ENCOUNTER — Telehealth: Payer: Self-pay | Admitting: *Deleted

## 2015-08-11 MED ORDER — AMLODIPINE BESYLATE 10 MG PO TABS: 10.0000 mg | ORAL_TABLET | Freq: Every day | ORAL | Status: DC

## 2015-08-11 NOTE — Telephone Encounter (Signed)
-----   Message from Arnoldo Lenis, MD sent at 08/11/2015  2:25 PM EST ----- BP remains too high. Please increase norvasc to 10mg  daily. Keep bp x 2 weeks and then call with results  Zandra Abts MD

## 2015-08-11 NOTE — Telephone Encounter (Signed)
Pt aware and agreeable. Amlodipine 10 mg sent to pharmacy. Will f/u in 2 weeks with BP

## 2015-09-02 ENCOUNTER — Telehealth: Payer: Self-pay | Admitting: *Deleted

## 2015-09-02 NOTE — Telephone Encounter (Signed)
-----   Message from Arnoldo Lenis, MD sent at 09/01/2015 11:34 AM EST ----- Before his nursing visit for his bp he needs to be sure to take his meds at least 1 hour before the visit so we can get an accurate blood pressure after he takes his meds    Zandra Abts MD

## 2015-09-02 NOTE — Telephone Encounter (Signed)
BP remains too high on recent bp log. We have made quite a few changes over the last few checks. Can he come in for a nursing visit with all of his pill bottles to verify appropriate dosing and also bring his bp cuff with him to compare his to ours.    Zandra Abts MD   Pt will come for nurse visit 09/03/15 and bring BP machine, meds and will take BP meds prior to visit.

## 2015-09-03 ENCOUNTER — Ambulatory Visit (INDEPENDENT_AMBULATORY_CARE_PROVIDER_SITE_OTHER): Payer: Medicare Other | Admitting: *Deleted

## 2015-09-03 VITALS — BP 168/102 | HR 77

## 2015-09-03 DIAGNOSIS — I10 Essential (primary) hypertension: Secondary | ICD-10-CM

## 2015-09-03 NOTE — Progress Notes (Signed)
Pt here for BP and to go over medications. Pt brought BP machine as well manual check 168/102, machine BP 173/102. Pt took Lisinopril this morning before coming to appt this AM. Pt did not have amlodipine with medication. Says that [facility] never called him. [facility] did in fact fill medication but has since put it back since pt did not pick up. Pt says he will pick Amlodipine up today. Pt has not taken any dose of this as originally thought in previous telephone note. Will confirm with pt later today if he picked up medication.

## 2015-09-16 ENCOUNTER — Ambulatory Visit: Payer: Medicare Other | Admitting: Cardiology

## 2015-10-30 ENCOUNTER — Encounter: Payer: Self-pay | Admitting: Cardiology

## 2015-10-30 ENCOUNTER — Encounter: Payer: Self-pay | Admitting: *Deleted

## 2015-10-30 ENCOUNTER — Ambulatory Visit (INDEPENDENT_AMBULATORY_CARE_PROVIDER_SITE_OTHER): Payer: Medicare Other | Admitting: Cardiology

## 2015-10-30 VITALS — BP 142/84 | HR 87 | Ht 73.0 in | Wt 213.0 lb

## 2015-10-30 DIAGNOSIS — E785 Hyperlipidemia, unspecified: Secondary | ICD-10-CM | POA: Diagnosis not present

## 2015-10-30 DIAGNOSIS — I10 Essential (primary) hypertension: Secondary | ICD-10-CM | POA: Diagnosis not present

## 2015-10-30 DIAGNOSIS — I739 Peripheral vascular disease, unspecified: Secondary | ICD-10-CM

## 2015-10-30 DIAGNOSIS — I2510 Atherosclerotic heart disease of native coronary artery without angina pectoris: Secondary | ICD-10-CM | POA: Diagnosis not present

## 2015-10-30 MED ORDER — LISINOPRIL 40 MG PO TABS: 40.0000 mg | ORAL_TABLET | Freq: Every day | ORAL | Status: DC

## 2015-10-30 NOTE — Patient Instructions (Signed)
Your physician wants you to follow-up in: [place] DR. BRANCH You will receive a reminder letter in the mail two months in advance. If you don't receive a letter, please call our office to schedule the follow-up appointment.  Your physician has recommended you make the following change in your medication:   RESUME LISINOPRIL 40 MG DAILY  Your physician has requested that you regularly monitor and record your blood pressure readings at home FOR 2 WEEKS AND BRING OR MAIL TO OFFICE. Please use the same machine at the same time of day to check your readings and record them to bring to your follow-up visit.  Thank you for choosing [facility]!!

## 2015-10-30 NOTE — Progress Notes (Signed)
Patient ID: JAZEN MCKINNON, male   DOB: 14-Mar-1961, 55 y.o.   MRN: [id number]     Clinical Summary Mr. Shill is a 55 y.o.male seen today for follow up of the following medical problems.   1. HTN - since last visit he started taking his norvasc regularly, however mistakingly has stopped taking his lisionpril. Home bp's are elevated with SBPs in 150s.   2. HL - compliant with statin  3. PAD - hx of bilateral SFA stents in 2012, left femoral to above knee popliteteal bypass 2013 and right femoral to below knee bypass 07/2014 - no recent leg pain or foot sores.  4. CAD - cath 2013 with patent vessels other than small OM Tommy Goostree that was occluded and an occluded small non-dominant RCA. LVEF by LVgram 65%.  - Patient has small area of ischemia on MPI 07/2014 related to this disease that has been managed medically.   - no recent chest pain or SOB  5. CKD stage II - he has stopped his lisinopril at home by mistake   Past Medical History  Diagnosis Date  . Hypertension   . Hyperlipidemia   . Peripheral vascular disease     a. bilateral superficial femoral artery stents in May/August of 2012. b. Left femoral to above-knee popliteal bypass in 08/2012.  . Tobacco abuse   . CAD (coronary artery disease)     a. Cath 07/2012 following abnormal nuclear stress test: 20-30% LAD, 20-30% OM1, occluded Adrieana Fennelly off OM2 that fills slowly via collaterals, small nondominant RCA occluded proximally, fills slowly via collaterals.  . CKD (chronic kidney disease), stage II   . Renal insufficiency   . Stroke     2008 right side weakness  . Depression      No Known Allergies   Current Outpatient Prescriptions  Medication Sig Dispense Refill  . amLODipine (NORVASC) 10 MG tablet Take 1 tablet (10 mg total) by mouth daily. (Patient not taking: Reported on 09/03/2015) 180 tablet 3  . aspirin EC 81 MG tablet Take 81 mg by mouth daily.    Marland Kitchen lisinopril (PRINIVIL,ZESTRIL) 40 MG tablet Take 1 tablet (40 mg  total) by mouth daily. 90 tablet 3  . rosuvastatin (CRESTOR) 10 MG tablet Take 1 tablet (10 mg total) by mouth daily. 90 tablet 3  . valACYclovir (VALTREX) 500 MG tablet Take 500 mg by mouth at bedtime.     No current facility-administered medications for this visit.     Past Surgical History  Procedure Laterality Date  . Stents in bil legs    . Colonoscopy    . Femoral artery - popliteal artery bypass graft    . Femoral-popliteal bypass graft  09/26/2012    Procedure: BYPASS GRAFT FEMORAL-POPLITEAL ARTERY;  Surgeon: Sherren Kerns, MD;  Location: [facility] OR;  Service: Vascular;  Laterality: Left;  using 6mm Gore-Tex Propaten Ringed Graft, Vein patch on the proximal and distal anastomosis  . Pr vein bypass graft,aorto-fem-pop    . Cardiac catheterization    . Femoral-popliteal bypass graft Right 08/12/2014    Procedure:  FEMORAL-POPLITEAL ARTERY BYPASS WITH SAPHENOUS VEIN GRAFT, INTRAOPERATIVE ARTERIOGRAM;  Surgeon: Sherren Kerns, MD;  Location: [facility] OR;  Service: Vascular;  Laterality: Right;  . Intraoperative arteriogram  08/12/2014    Procedure: INTRA OPERATIVE ARTERIOGRAM;  Surgeon: Sherren Kerns, MD;  Location: [facility] OR;  Service: Vascular;;  . Abdominal aortagram N/A 04/27/2012    Procedure: ABDOMINAL Ronny Flurry;  Surgeon: Fransisco Hertz, MD;  Location: [facility] CATH  LAB;  Service: Cardiovascular;  Laterality: N/A;  . Abdominal aortagram N/A 08/09/2014    Procedure: ABDOMINAL AORTAGRAM;  Surgeon: Sherren Kerns, MD;  Location: [facility] CATH LAB;  Service: Cardiovascular;  Laterality: N/A;     No Known Allergies    Family History  Problem Relation Age of Onset  . Diabetes Mother   . Heart disease Mother     before age 94- Triple BPG  . Cancer Mother     Luekemia  . Deep vein thrombosis Mother   . Hyperlipidemia Mother   . Hypertension Mother   . Heart attack Mother   . Peripheral vascular disease Mother   . Stroke Mother     x 3  . Arthritis Father      Social History Mr. Hanby  reports that he quit smoking about 5 months ago. His smoking use included Cigarettes. He started smoking about 40 years ago. He has a 12.5 pack-year smoking history. He has never used smokeless tobacco. Mr. Pelino reports that he does not drink alcohol.   Review of Systems CONSTITUTIONAL: No weight loss, fever, chills, weakness or fatigue.  HEENT: Eyes: No visual loss, blurred vision, double vision or yellow sclerae.No hearing loss, sneezing, congestion, runny nose or sore throat.  SKIN: No rash or itching.  CARDIOVASCULAR: per hpi RESPIRATORY: No shortness of breath, cough or sputum.  GASTROINTESTINAL: No anorexia, nausea, vomiting or diarrhea. No abdominal pain or blood.  GENITOURINARY: No burning on urination, no polyuria NEUROLOGICAL: No headache, dizziness, syncope, paralysis, ataxia, numbness or tingling in the extremities. No change in bowel or bladder control.  MUSCULOSKELETAL: No muscle, back pain, joint pain or stiffness.  LYMPHATICS: No enlarged nodes. No history of splenectomy.  PSYCHIATRIC: No history of depression or anxiety.  ENDOCRINOLOGIC: No reports of sweating, cold or heat intolerance. No polyuria or polydipsia.  Marland Kitchen   Physical Examination Filed Vitals:   10/30/15 1024  BP: 142/84  Pulse: 87   Filed Vitals:   10/30/15 1024  Height: 6\' 1"  (1.854 m)  Weight: 213 lb (96.616 kg)    Gen: resting comfortably, no acute distress HEENT: no scleral icterus, pupils equal round and reactive, no palptable cervical adenopathy,  CV: RRR, no m/r/g, no jvd Resp: Clear to auscultation bilaterally GI: abdomen is soft, non-tender, non-distended, normal bowel sounds, no hepatosplenomegaly MSK: extremities are warm, no edema.  Skin: warm, no rash Neuro:  no focal deficits Psych: appropriate affect   Diagnostic Studies  07/2012 Cath Hemodynamics:   LV pressure: 115/23 Aortic pressure: 111/69  Angiography   Left Main: LM normal  Left anterior Descending: large, minor  luminal irregularities. 20-30% stenosis diffusely. Several small diagonals which have only minor luminal irreg.  Left Circumflex: large and dominant, 20-30%, OM 1 is small and normal, OM2 is a small vessel. There is a small Jameek Bruntz off the OM2 that is occluded and fills slowly via collaterals. PDA is unremarkable  Right Coronary Artery: small , nondominant, occluded Proximally, fills slowly via collaterals  LV Gram: overall normal LV function . EF 65%  Complications: No apparent complications Patient did tolerate procedure well.  Contrast used: 110 CC  Conclusions:  1. Mild- moderate irregularities. The large LAD and the large dominant LCx have only minor luminal irregularities. He has a small OM Clois Montavon that is occluded and a small non dominant RCA that is also occluded. These are likely the source of his abnormal myoview.  2. Normal LV function.   He should be at low risk for his  upcoming vascular surgery.   07/2014 MPI IMPRESSION: 1. Abnormal study. There is a small region of ischemia in the inferolateral wall which would be reasonably consistent with the patient's known history of CAD.  2. Normal left ventricular wall motion.  3. Left ventricular ejection fraction 61%  4. Low-risk stress test findings*.    Assessment and Plan   1. HTN - he started taking his norvasc regularly, however stopped his lisinopril 20mg  daily by mistake. He was instructed that he is to be on both medications. He will submit bp log in 2 weeks.   2. HL -continue crestor  3. PAD - continue to follow with vascular. No current symptoms  4. CAD - no current symptoms - we will continue current meds  5. CKD stage II - restart lisinopril  F/u 6 months     Antoine Poche, M.D., F.A.C.C.

## 2015-11-20 ENCOUNTER — Telehealth: Payer: Self-pay | Admitting: *Deleted

## 2015-11-20 NOTE — Telephone Encounter (Signed)
-----   Message from Arnoldo Lenis, MD sent at 11/19/2015  1:55 PM EST ----- BP's overall look ok, continue current meds  Zandra Abts MD

## 2015-11-20 NOTE — Telephone Encounter (Signed)
Patient informed via vm. 

## 2015-12-01 ENCOUNTER — Telehealth: Payer: Self-pay | Admitting: *Deleted

## 2015-12-01 ENCOUNTER — Encounter: Payer: Self-pay | Admitting: *Deleted

## 2015-12-01 NOTE — Telephone Encounter (Signed)
Unable to reach pt by phone, mailed pt letter.

## 2015-12-01 NOTE — Telephone Encounter (Signed)
-----   Message from Laurine Blazer, LPN sent at [id number]  4:52 PM EST -----   ----- Message -----    From: Arnoldo Lenis, MD    Sent: 11/28/2015   4:47 PM      To: Laurine Blazer, LPN  BP log reviewed and looks good. He does not have to keep the logs anymore, continue current meds   Zandra Abts MD

## 2015-12-25 ENCOUNTER — Ambulatory Visit: Payer: Medicare Other | Admitting: Family

## 2015-12-25 ENCOUNTER — Encounter (HOSPITAL_COMMUNITY): Payer: Medicare Other

## 2015-12-25 ENCOUNTER — Other Ambulatory Visit (HOSPITAL_COMMUNITY): Payer: Medicare Other

## 2015-12-26 ENCOUNTER — Ambulatory Visit: Payer: Medicare Other | Admitting: Family

## 2015-12-26 ENCOUNTER — Encounter: Payer: Self-pay | Admitting: Family

## 2015-12-26 ENCOUNTER — Other Ambulatory Visit (HOSPITAL_COMMUNITY): Payer: Medicare Other

## 2015-12-26 ENCOUNTER — Encounter (HOSPITAL_COMMUNITY): Payer: Medicare Other

## 2016-01-02 ENCOUNTER — Ambulatory Visit (HOSPITAL_COMMUNITY)
Admission: RE | Admit: 2016-01-02 | Discharge: 2016-01-02 | Disposition: A | Discharge status: Home / Self Care | Payer: Medicare Other | Source: Ambulatory Visit | Attending: Family | Admitting: Family

## 2016-01-02 ENCOUNTER — Encounter: Payer: Self-pay | Admitting: Family

## 2016-01-02 ENCOUNTER — Encounter: Payer: Medicare Other | Admitting: Family

## 2016-01-02 ENCOUNTER — Ambulatory Visit (INDEPENDENT_AMBULATORY_CARE_PROVIDER_SITE_OTHER)
Admission: RE | Admit: 2016-01-02 | Discharge: 2016-01-02 | Disposition: A | Discharge status: Home / Self Care | Payer: Medicare Other | Source: Ambulatory Visit | Attending: Family | Admitting: Family

## 2016-01-02 VITALS — BP 170/104 | HR 68 | Temp 98.6°F | Resp 18 | Ht 73.0 in | Wt 214.6 lb

## 2016-01-02 DIAGNOSIS — Z95828 Presence of other vascular implants and grafts: Secondary | ICD-10-CM

## 2016-01-02 DIAGNOSIS — E785 Hyperlipidemia, unspecified: Secondary | ICD-10-CM | POA: Insufficient documentation

## 2016-01-02 DIAGNOSIS — F17200 Nicotine dependence, unspecified, uncomplicated: Secondary | ICD-10-CM

## 2016-01-02 DIAGNOSIS — R0989 Other specified symptoms and signs involving the circulatory and respiratory systems: Secondary | ICD-10-CM | POA: Diagnosis present

## 2016-01-02 DIAGNOSIS — I779 Disorder of arteries and arterioles, unspecified: Secondary | ICD-10-CM

## 2016-01-02 DIAGNOSIS — Z720 Tobacco use: Secondary | ICD-10-CM | POA: Insufficient documentation

## 2016-01-02 DIAGNOSIS — Z48812 Encounter for surgical aftercare following surgery on the circulatory system: Secondary | ICD-10-CM

## 2016-01-02 DIAGNOSIS — N182 Chronic kidney disease, stage 2 (mild): Secondary | ICD-10-CM | POA: Insufficient documentation

## 2016-01-02 DIAGNOSIS — I129 Hypertensive chronic kidney disease with stage 1 through stage 4 chronic kidney disease, or unspecified chronic kidney disease: Secondary | ICD-10-CM | POA: Diagnosis not present

## 2016-01-02 DIAGNOSIS — R030 Elevated blood-pressure reading, without diagnosis of hypertension: Secondary | ICD-10-CM

## 2016-01-02 DIAGNOSIS — IMO0001 Reserved for inherently not codable concepts without codable children: Secondary | ICD-10-CM

## 2016-01-02 NOTE — Progress Notes (Addendum)
[facility] -PAD  History of Present Illness Todd [name] is a 55 y.o. male patient of Dr. Oneida Alar who is status post right femoral to below-knee popliteal bypass with non-reversed greater saphenous vein on 08/12/2014. He had peroneal and anterior tibial artery runoff. The patient states that his right foot is significantly improved. All of his incisions are completely healed. He previously had a left femoral to above-knee popliteal bypass with PTFE and vein patch proximally and distally in 2013. He had single vessel anterior tibial runoff to the left foot. He has no symptoms of claudication rest pain or ulcers in either foot at this point.  Unfortunately he continues to smoke. He denies shortness of breath. He denies chest pain.  Pt states he had a stroke in 2008 as manifested by right hemiparesis, still has mild right arm residual weakness, he also had speech difficulties which resolved after a year; he denies monocular vision changes. Only carotid Duplex result on file is from 2008: Mild bilateral carotid bifurcation plaque without hemodynamically significant stenosis. Pt states his ophthalmologist in [place] ordered a more recent carotid Duplex that he had done in January or February 2016, results not on file.  Pt still sees a cardiologist but does not recall name, states he had a stress test about November 2015.  His cardiologist increased his lisinopril from 10 to 20 mg daily in 2016; started a statin in 2016  Pt Diabetic: No Pt smoker: smoker (started at age 17 yrs, quit from 2009 to 2013)  Pt meds include: Statin :yes Betablocker: Yes ASA: yes Other anticoagulants/antiplatelets: no    Past Medical History  Diagnosis Date  . Hypertension   . Hyperlipidemia   . Peripheral vascular disease ([place])     a. bilateral superficial femoral artery stents in May/August of 2012. b. Left femoral to above-knee popliteal bypass in 08/2012.  .  Tobacco abuse   . CAD (coronary artery disease)     a. Cath 07/2012 following abnormal nuclear stress test: 20-30% LAD, 20-30% OM1, occluded branch off OM2 that fills slowly via collaterals, small nondominant RCA occluded proximally, fills slowly via collaterals.  . CKD (chronic kidney disease), stage II   . Renal insufficiency   . Stroke [facility])     2008 right side weakness  . Depression     Social History Social History  Substance Use Topics  . Smoking status: Current Every Day Smoker -- 1.00 packs/day for 25 years    Types: Cigarettes    Start date: 11/16/1974  . Smokeless tobacco: Never Used  . Alcohol Use: No    Family History Family History  Problem Relation Age of Onset  . Diabetes Mother   . Heart disease Mother     before age 72- Triple BPG  . Cancer Mother     Luekemia  . Deep vein thrombosis Mother   . Hyperlipidemia Mother   . Hypertension Mother   . Heart attack Mother   . Peripheral vascular disease Mother   . Stroke Mother     x 3  . Arthritis Father     Past Surgical History  Procedure Laterality Date  . Stents in bil legs    . Colonoscopy    . Femoral artery - popliteal artery bypass graft    . Femoral-popliteal bypass graft  09/26/2012    Procedure: BYPASS GRAFT FEMORAL-POPLITEAL ARTERY;  Surgeon: Elam Dutch, MD;  Location: [place];  Service: Vascular;  Laterality: Left;  using 41mm Gore-Tex Propaten Ringed Graft, Vein patch on the proximal and distal anastomosis  . Pr vein bypass graft,aorto-fem-pop    . Cardiac catheterization    . Femoral-popliteal bypass graft Right 08/12/2014    Procedure:  FEMORAL-POPLITEAL ARTERY BYPASS WITH SAPHENOUS VEIN GRAFT, INTRAOPERATIVE ARTERIOGRAM;  Surgeon: Elam Dutch, MD;  Location: [place];  Service: Vascular;  Laterality: Right;  . Intraoperative arteriogram  08/12/2014    Procedure: INTRA OPERATIVE ARTERIOGRAM;  Surgeon: Elam Dutch, MD;  Location: [place];  Service: Vascular;;  . Abdominal aortagram  N/A 04/27/2012    Procedure: ABDOMINAL Maxcine Ham;  Surgeon: Conrad Clear Lake, MD;  Location: [facility] CATH LAB;  Service: Cardiovascular;  Laterality: N/A;  . Abdominal aortagram N/A 08/09/2014    Procedure: ABDOMINAL Maxcine Ham;  Surgeon: Elam Dutch, MD;  Location: [facility] C[facility] LAB;  Service: Cardiovascular;  Laterality: N/A;    No Known Allergies  Current Outpatient Prescriptions  Medication Sig Dispense Refill  . amLODipine (NORVASC) 10 MG tablet Take 1 tablet (10 mg total) by mouth daily. 180 tablet 3  . aspirin EC 81 MG tablet Take 81 mg by mouth daily.    Marland Kitchen lisinopril (PRINIVIL,ZESTRIL) 40 MG tablet Take 1 tablet (40 mg total) by mouth daily. 90 tablet 3  . rosuvastatin (CRESTOR) 10 MG tablet Take 1 tablet (10 mg total) by mouth daily. 90 tablet 3  . valACYclovir (VALTREX) 500 MG tablet Take 500 mg by mouth at bedtime.     No current facility-administered medications for this visit.    ROS: See HPI for pertinent positives and negatives.   Physical Examination  Filed Vitals:   01/02/16 1152 01/02/16 1155  BP: 174/101 170/104  Pulse: 68   Temp: 98.6 F (37 C)   TempSrc: Oral   Resp: 18   Height: 6\' 1"  (1.854 m)   Weight: 214 lb 9.6 oz (97.342 kg)   SpO2: 100%    Body mass index is 28.32 kg/(m^2).   General: A&O x 3, WDWN. Gait: normal Eyes: PERRLA. Pulmonary: CTAB with slightly decreased air movement, without wheezes , rales or rhonchi. Cardiac: regular rhythm, no detected murmur.     Carotid Bruits Right Left   Positive Negative  Aorta is not palpable. Radial pulses: 2+ palpable and =   VASCULAR EXAM: Extremities without ischemic changes. without Gangrene; without open wounds.     LE Pulses Right Left   FEMORAL 3+ palpable 3+ palpable    POPLITEAL 2+  palpable  2+ palpable   POSTERIOR TIBIAL not palpable  not palpable    DORSALIS PEDIS  ANTERIOR TIBIAL 2+ palpable  2+ palpable     Abdomen: soft, NT, no palpable masses. Skin: no rashes, no ulcers. Musculoskeletal: no muscle wasting or atrophy. Neurologic: A&O X 3; Appropriate Affect, MOTOR FUNCTION: moving all extremities equally, motor strength 5/5 throughout. Speech is fluent/normal. CN 2-12 intact.                 Non-Invasive Vascular Imaging: DATE: 01/02/2016   LOWER EXTREMITY ARTERIAL DUPLEX EVALUATION    INDICATION: Follow up right lower extremity bypass graft    PREVIOUS INTERVENTION(S): Right femoropopliteal bypass graft placed 08/12/2014;  Left femoropopliteal bypass graft placed 09/26/2012    DUPLEX EXAM:     RIGHT  LEFT   Peak Systolic Velocity (cm/s) Ratio (if abnormal) Waveform  Peak Systolic Velocity (cm/s) Ratio (if abnormal) Waveform  137  T Inflow Artery     102  T Proximal Anastomosis  61  B Proximal Graft     53  T Mid Graft     49  T  Distal Graft     43  T Distal Anastomosis     45  T Outflow Artery     1.0 Today's ABI / TBI 1.0  1.0 Previous ABI / TBI ( 06/19/15 ) 1.0    Waveform:    M - Monophasic       B - Biphasic       T - Triphasic  If Ankle Brachial Index (ABI) or Toe Brachial Index (TBI) performed, please see complete report     ADDITIONAL FINDINGS:     IMPRESSION: 1. Patent right femoropopliteal bypass graft without evidence for restenosis    Compared to the previous exam:  No change since prior exam of 06/19/2015      ASSESSMENT: Todd [name] is a 55 y.o. male who is status post right femoral to below-knee popliteal bypass with non-reversed greater saphenous vein on 08/12/2014 and left femoropopliteal arterial bypass graft placed 09/26/2012.  Today's right LE arterial duplex suggests a widely patent right femoropopliteal arterial bypass graft without evidence of restenosis or  hyperplasia. ABI's remain normal with bi and triphasic waveforms in the right and mono and triphasic waveforms in the left.  Review of records indicates and pt confirms that he has been working with his cardiologist to get his blood pressure under control, and apparently it was under control recently. I advised to contact his cardiologist about his elevated blood pressure today.   PLAN:  Continue extensive walking. The patient was counseled re smoking cessation and given several free resources re smoking cessation.  Based on the patient's vascular studies and examination, pt will return to clinic in 1 year with ABI and right LE arterial duplex.  I discussed in depth with the patient the nature of atherosclerosis, and emphasized the importance of maximal medical management including strict control of blood pressure, blood glucose, and lipid levels, obtaining regular exercise, and cessation of smoking.  The patient is aware that without maximal medical management the underlying atherosclerotic disease process will progress, limiting the benefit of any interventions.  The patient was given information about PAD including signs, symptoms, treatment, what symptoms should prompt the patient to seek immediate medical care, and risk reduction measures to take.  Clemon Chambers, RN, MSN, FNP-C [facility] [employer] [facility] Phone: [phone]  Clinic MD: Scot Dock on call  01/02/2016 9:41 PM

## 2016-01-02 NOTE — Addendum Note (Signed)
Addended by: Viann Fish on: 01/02/2016 09:57 PM   Modules accepted: Level of Service

## 2016-01-02 NOTE — Progress Notes (Signed)
Filed Vitals:   01/02/16 1152 01/02/16 1155  BP: 174/101 170/104  Pulse: 68   Temp: 98.6 F (37 C)   TempSrc: Oral   Resp: 18   Height: 6\' 1"  (1.854 m)   Weight: 214 lb 9.6 oz (97.342 kg)   SpO2: 100%

## 2016-02-05 NOTE — Addendum Note (Signed)
Addended by: Dorothyann Gibbs on: 02/05/2016 09:31 AM   Modules accepted: Orders

## 2016-03-16 ENCOUNTER — Ambulatory Visit (INDEPENDENT_AMBULATORY_CARE_PROVIDER_SITE_OTHER): Payer: Medicare Other

## 2016-03-16 ENCOUNTER — Ambulatory Visit (INDEPENDENT_AMBULATORY_CARE_PROVIDER_SITE_OTHER): Payer: Medicare Other | Admitting: Podiatry

## 2016-03-16 ENCOUNTER — Encounter: Payer: Self-pay | Admitting: Podiatry

## 2016-03-16 VITALS — BP 148/97 | HR 67 | Resp 16

## 2016-03-16 DIAGNOSIS — M722 Plantar fascial fibromatosis: Secondary | ICD-10-CM

## 2016-03-16 DIAGNOSIS — M79672 Pain in left foot: Secondary | ICD-10-CM | POA: Diagnosis not present

## 2016-03-16 MED ORDER — MELOXICAM 15 MG PO TABS: 15.0000 mg | ORAL_TABLET | Freq: Every day | ORAL | Status: DC

## 2016-03-16 MED ORDER — METHYLPREDNISOLONE 4 MG PO TBPK: ORAL_TABLET | ORAL | Status: DC

## 2016-03-16 NOTE — Progress Notes (Signed)
Subjective:    Patient ID: Todd [name], male    DOB: 08-01-1961, 55 y.o.   MRN: [id number]  HPI: He presents today with a chief complaint of six-month duration of pain to his left heel. He has been seen by another podiatrist who injected him 3 times and provided him with no other support mechanism or ability to heal. He states that all he was to do is give me a shot. 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    Review of Systems  All other systems reviewed and are negative.      Objective:   Physical Exam: Vital signs are stable he is alert and oriented 3 pulses are palpable. Neurologic sensorium appears to be intact the tendon reflexes are not was able muscle strength is normal bilateral orthopedic evaluation of a straight pain on palpation medial calcaneal tubercle of the left heel. Radiographs do demonstrate plantar distally oriented calcaneal heel spur with severe increase in soft tissue density of the plantar fascia calcaneal insertion site along the left heel. No open lesions or wounds are noted.        Assessment & Plan:  Assessment: Chronic intractable plantar fasciitis left. Previously been treated.  Plan: I started him on a steroid dose pack followed by [facility]ic. I injected his left heel with Kenalog and local anesthetic but even plantar fascia brace. Discussed appropriate shoe gear stretching exercises ice therapy and shoe modifications. I'll follow-up with him in 1 month. At that time if he is not improved we will consider surgical intervention.

## 2016-03-16 NOTE — Patient Instructions (Signed)

## 2016-03-26 ENCOUNTER — Encounter: Payer: Self-pay | Admitting: Cardiology

## 2016-04-13 ENCOUNTER — Ambulatory Visit (INDEPENDENT_AMBULATORY_CARE_PROVIDER_SITE_OTHER): Payer: Medicare Other | Admitting: Podiatry

## 2016-04-13 ENCOUNTER — Encounter: Payer: Self-pay | Admitting: Podiatry

## 2016-04-13 DIAGNOSIS — M722 Plantar fascial fibromatosis: Secondary | ICD-10-CM | POA: Diagnosis not present

## 2016-04-13 MED ORDER — MELOXICAM 15 MG PO TABS: 15.0000 mg | ORAL_TABLET | Freq: Every day | ORAL | Status: DC

## 2016-04-13 NOTE — Progress Notes (Signed)
He presents today for follow-up of his plantar fasciitis and states that his left foot is doing much better and he continues to wear his plantar fascia brace.  Objective: Vital signs are stable alert and oriented 3. Pulses are palpable. Mild tenderness on palpation medial calcaneal tubercle of the left heel. Pain.  Assessment: Pain in limb secondary to plantar fasciitis left foot 80% resolved.  Plan: Reinjected his left heel today and suggested that he continue with his anti-inflammatories. Follow up with him in 1 month if necessary.

## 2016-04-27 ENCOUNTER — Ambulatory Visit (INDEPENDENT_AMBULATORY_CARE_PROVIDER_SITE_OTHER): Payer: Medicare Other | Admitting: Cardiology

## 2016-04-27 ENCOUNTER — Encounter: Payer: Self-pay | Admitting: Cardiology

## 2016-04-27 VITALS — BP 125/82 | HR 74 | Ht 73.0 in | Wt 206.0 lb

## 2016-04-27 DIAGNOSIS — I10 Essential (primary) hypertension: Secondary | ICD-10-CM | POA: Diagnosis not present

## 2016-04-27 DIAGNOSIS — R7309 Other abnormal glucose: Secondary | ICD-10-CM

## 2016-04-27 DIAGNOSIS — I2510 Atherosclerotic heart disease of native coronary artery without angina pectoris: Secondary | ICD-10-CM | POA: Diagnosis not present

## 2016-04-27 DIAGNOSIS — E785 Hyperlipidemia, unspecified: Secondary | ICD-10-CM

## 2016-04-27 DIAGNOSIS — N182 Chronic kidney disease, stage 2 (mild): Secondary | ICD-10-CM | POA: Diagnosis not present

## 2016-04-27 DIAGNOSIS — R739 Hyperglycemia, unspecified: Secondary | ICD-10-CM

## 2016-04-27 NOTE — Progress Notes (Signed)
Clinical Summary Todd [name] is a 55 y.o.male seen today for follow up of the following medical problems.   1. HTN - most recent submitted bp log looked good  -continues to  Check bp at home, typically 113/75  2. HL - compliant with statin  3. PAD - hx of bilateral SFA stents in 2012, left femoral to above knee popliteteal bypass 2013 and right femoral to below knee bypass 07/2014 - no recent claudication pain or foot sores.   4. CAD - cath 2013 with patent vessels other than small OM Todd [name] that was occluded and an occluded small non-dominant RCA. LVEF by LVgram 65%.  - Patient has small area of ischemia on MPI 07/2014 related to this disease that has been managed medically.   - no recent chest pain. No recent SOB or DOE.   5. CKD stage II   SH: retired Camera operator Past Medical History:  Diagnosis Date  . CAD (coronary artery disease)    a. Cath 07/2012 following abnormal nuclear stress test: 20-30% LAD, 20-30% OM1, occluded Waylan Busta off OM2 that fills slowly via collaterals, small nondominant RCA occluded proximally, fills slowly via collaterals.  . CKD (chronic kidney disease), stage II   . Depression   . Hyperlipidemia   . Hypertension   . Peripheral vascular disease ([place])    a. bilateral superficial femoral artery stents in May/August of 2012. b. Left femoral to above-knee popliteal bypass in 08/2012.  Marland Kitchen Renal insufficiency   . Stroke [facility])    2008 right side weakness  . Tobacco abuse      No Known Allergies   Current Outpatient Prescriptions  Medication Sig Dispense Refill  . amLODipine (NORVASC) 10 MG tablet Take 1 tablet (10 mg total) by mouth daily. 180 tablet 3  . aspirin EC 81 MG tablet Take 81 mg by mouth daily.    Marland Kitchen lisinopril (PRINIVIL,ZESTRIL) 40 MG tablet Take 1 tablet (40 mg total) by mouth daily. 90 tablet 3  . meloxicam (MOBIC) 15 MG tablet Take 1 tablet (15 mg total) by mouth daily. 30 tablet 3  . rosuvastatin (CRESTOR) 10 MG tablet  Take 1 tablet (10 mg total) by mouth daily. 90 tablet 3  . valACYclovir (VALTREX) 500 MG tablet Take 500 mg by mouth at bedtime.     No current facility-administered medications for this visit.      Past Surgical History:  Procedure Laterality Date  . ABDOMINAL AORTAGRAM N/A 04/27/2012   Procedure: ABDOMINAL Maxcine Ham;  Surgeon: Conrad Ruthton, MD;  Location[facility] C[facility] LAB;  Service: Cardiovascular;  Laterality: N/A;  . ABDOMINAL AORTAGRAM N/A 08/09/2014   Procedure: ABDOMINAL Maxcine Ham;  Surgeon: Elam Dutch, MD;  Location: [facility] CATH LAB;  Service: Cardiovascular;  Laterality: N/A;  . CARDIAC CATHETERIZATION    . COLONOSCOPY    . FEMORAL ARTERY - POPLITEAL ARTERY BYPASS GRAFT    . FEMORAL-POPLITEAL BYPASS GRAFT  09/26/2012   Procedure: BYPASS GRAFT FEMORAL-POPLITEAL ARTERY;  Surgeon: Elam Dutch, MD;  Location: [facility] OR;  Service: Vascular;  Laterality: Left;  using 14mm Gore-Tex Propaten Ringed Graft, Vein patch on the proximal and distal anastomosis  . FEMORAL-POPLITEAL BYPASS GRAFT Right 08/12/2014   Procedure:  FEMORAL-POPLITEAL ARTERY BYPASS WITH SAPHENOUS VEIN GRAFT, INTRAOPERATIVE ARTERIOGRAM;  Surgeon: Elam Dutch, MD;  Location: [place];  Service: Vascular;  Laterality: Right;  . INTRAOPERATIVE ARTERIOGRAM  08/12/2014   Procedure: INTRA OPERATIVE ARTERIOGRAM;  Surgeon: Elam Dutch, MD;  Location: [place];  Service: Vascular;;  . PR VEIN BYPASS GRAFT,AORTO-FEM-POP    . stents in bil legs       No Known Allergies    Family History  Problem Relation Age of Onset  . Diabetes Mother   . Heart disease Mother     before age 1- Triple BPG  . Cancer Mother     Luekemia  . Deep vein thrombosis Mother   . Hyperlipidemia Mother   . Hypertension Mother   . Heart attack Mother   . Peripheral vascular disease Mother   . Stroke Mother     x 3  . Arthritis Father      Social History Mr. Reichner reports that he has been smoking Cigarettes.  He started smoking about 41  years ago. He has a 25.00 pack-year smoking history. He has never used smokeless tobacco. Mr. Roelofs reports that he does not drink alcohol.   Review of Systems CONSTITUTIONAL: No weight loss, fever, chills, weakness or fatigue.  HEENT: Eyes: No visual loss, blurred vision, double vision or yellow sclerae.No hearing loss, sneezing, congestion, runny nose or sore throat.  SKIN: No rash or itching.  CARDIOVASCULAR: per HPI RESPIRATORY: No shortness of breath, cough or sputum.  GASTROINTESTINAL: No anorexia, nausea, vomiting or diarrhea. No abdominal pain or blood.  GENITOURINARY: No burning on urination, no polyuria NEUROLOGICAL: No headache, dizziness, syncope, paralysis, ataxia, numbness or tingling in the extremities. No change in bowel or bladder control.  MUSCULOSKELETAL: No muscle, back pain, joint pain or stiffness.  LYMPHATICS: No enlarged nodes. No history of splenectomy.  PSYCHIATRIC: No history of depression or anxiety.  ENDOCRINOLOGIC: No reports of sweating, cold or heat intolerance. No polyuria or polydipsia.  Marland Kitchen   Physical Examination Vitals:   04/27/16 0952  BP: 125/82  Pulse: 74   Vitals:   04/27/16 0952  Weight: 206 lb (93.4 kg)  Height: 6\' 1"  (1.854 m)    Gen: resting comfortably, no acute distress HEENT: no scleral icterus, pupils equal round and reactive, no palptable cervical adenopathy,  CV: RRR, no m/r/g, no jvd Resp: Clear to auscultation bilaterally GI: abdomen is soft, non-tender, non-distended, normal bowel sounds, no hepatosplenomegaly MSK: extremities are warm, no edema.  Skin: warm, no rash Neuro:  no focal deficits Psych: appropriate affect   Diagnostic Studies 07/2012 Cath Hemodynamics:   LV pressure: 115/23 Aortic pressure: 111/69  Angiography   Left Main: LM normal  Left anterior Descending: large, minor luminal irregularities. 20-30% stenosis diffusely. Several small diagonals which have only minor luminal irreg.  Left  Circumflex: large and dominant, 20-30%, OM 1 is small and normal, OM2 is a small vessel. There is a small Arvie Villarruel off the OM2 that is occluded and fills slowly via collaterals. PDA is unremarkable  Right Coronary Artery: small , nondominant, occluded Proximally, fills slowly via collaterals  LV Gram: overall normal LV function . EF [id number]  Complications: No apparent complications Patient did tolerate procedure well.  Contrast used: 110 CC  Conclusions:  1. Mild- moderate irregularities. The large LAD and the large dominant LCx have only minor luminal irregularities. He has a small OM Lavar Rosenzweig that is occluded and a small non dominant RCA that is also occluded. These are likely the source of his abnormal myoview.  2. Normal LV function.   He should be at low risk for his upcoming vascular surgery.   07/2014 MPI IMPRESSION: 1. Abnormal study. There is a small region of ischemia in the inferolateral wall which would be reasonably consistent  with the patient's known history of CAD.  2. Normal left ventricular wall motion.  3. Left ventricular ejection fraction 61%  4. Low-risk stress test findings*.     Assessment and Plan  1. HTN - at goal, continue current meds  2. HL -we will continue statin. Repeat lipid panel  3. PAD - continue to follow with vascular. No current symptoms  4. CAD - no current symptoms -continue current meds ril  F/u 6 months. Repeat annual labs.       Arnoldo Lenis, M.D.

## 2016-04-27 NOTE — Patient Instructions (Signed)
Medication Instructions:   Continue all current medications.  Labwork:  CMET, CBC, FLP, TSH, Magnesium, HgA1c - orders given today.  Reminder:  Nothing to eat or drink after 12 midnight prior to labs.  Office will contact with results via phone or letter.    Testing/Procedures:  None.  Follow-Up:  Your physician wants you to follow up in: 6 months.  You will receive a reminder letter in the mail one-two months in advance.  If you don't receive a letter, please call our office to schedule the follow up appointment   Any Other Special Instructions Will Be Listed Below (If Applicable).  If you need a refill on your cardiac medications before your next appointment, please call your pharmacy.

## 2016-05-09 ENCOUNTER — Other Ambulatory Visit: Payer: Self-pay | Admitting: Cardiology

## 2016-05-14 ENCOUNTER — Telehealth: Payer: Self-pay | Admitting: *Deleted

## 2016-05-14 NOTE — Telephone Encounter (Signed)
Pt aware, routed to pcp 

## 2016-05-14 NOTE — Telephone Encounter (Signed)
-----   Message from Arnoldo Lenis, MD sent at 05/14/2016 12:24 PM EDT ----- Labs look good other than he does have evidence of prediabetes. He needs to work on cutting back on sweets and fatty foods, increase exercise and weight loss  J BrancH MD

## 2016-05-25 ENCOUNTER — Encounter (INDEPENDENT_AMBULATORY_CARE_PROVIDER_SITE_OTHER): Payer: Medicare Other | Admitting: Podiatry

## 2016-05-25 NOTE — Progress Notes (Signed)
This encounter was created in error - please disregard.

## 2016-07-06 ENCOUNTER — Encounter: Payer: Self-pay | Admitting: Family

## 2016-07-08 ENCOUNTER — Ambulatory Visit: Payer: Medicare Other | Admitting: Family

## 2016-07-08 ENCOUNTER — Other Ambulatory Visit (HOSPITAL_COMMUNITY): Payer: Medicare Other

## 2016-07-08 ENCOUNTER — Encounter (HOSPITAL_COMMUNITY): Payer: Medicare Other

## 2016-07-27 ENCOUNTER — Encounter (HOSPITAL_COMMUNITY): Payer: Medicare Other

## 2016-07-27 ENCOUNTER — Other Ambulatory Visit (HOSPITAL_COMMUNITY): Payer: Medicare Other

## 2016-07-28 DIAGNOSIS — Z23 Encounter for immunization: Secondary | ICD-10-CM | POA: Diagnosis not present

## 2016-07-29 ENCOUNTER — Ambulatory Visit: Payer: Medicare Other | Admitting: Family

## 2016-08-11 ENCOUNTER — Encounter: Payer: Self-pay | Admitting: Family

## 2016-08-18 ENCOUNTER — Other Ambulatory Visit: Payer: Self-pay | Admitting: Family

## 2016-08-18 ENCOUNTER — Ambulatory Visit (HOSPITAL_COMMUNITY)
Admission: RE | Admit: 2016-08-18 | Discharge: 2016-08-18 | Disposition: A | Discharge status: Home / Self Care | Payer: Medicare Other | Source: Ambulatory Visit | Attending: Family | Admitting: Family

## 2016-08-18 DIAGNOSIS — F17200 Nicotine dependence, unspecified, uncomplicated: Secondary | ICD-10-CM

## 2016-08-18 DIAGNOSIS — R0989 Other specified symptoms and signs involving the circulatory and respiratory systems: Secondary | ICD-10-CM

## 2016-08-18 DIAGNOSIS — I739 Peripheral vascular disease, unspecified: Secondary | ICD-10-CM | POA: Diagnosis not present

## 2016-08-18 DIAGNOSIS — R030 Elevated blood-pressure reading, without diagnosis of hypertension: Secondary | ICD-10-CM

## 2016-08-18 DIAGNOSIS — I779 Disorder of arteries and arterioles, unspecified: Secondary | ICD-10-CM | POA: Insufficient documentation

## 2016-08-18 DIAGNOSIS — Z95828 Presence of other vascular implants and grafts: Secondary | ICD-10-CM

## 2016-08-18 DIAGNOSIS — IMO0001 Reserved for inherently not codable concepts without codable children: Secondary | ICD-10-CM

## 2016-08-23 ENCOUNTER — Ambulatory Visit (INDEPENDENT_AMBULATORY_CARE_PROVIDER_SITE_OTHER): Payer: Medicare Other | Admitting: Family

## 2016-08-23 ENCOUNTER — Other Ambulatory Visit: Payer: Self-pay | Admitting: *Deleted

## 2016-08-23 ENCOUNTER — Encounter: Payer: Self-pay | Admitting: Family

## 2016-08-23 VITALS — BP 130/82 | HR 76 | Temp 97.4°F | Ht 73.0 in | Wt 205.0 lb

## 2016-08-23 DIAGNOSIS — F17200 Nicotine dependence, unspecified, uncomplicated: Secondary | ICD-10-CM | POA: Diagnosis not present

## 2016-08-23 DIAGNOSIS — Z95828 Presence of other vascular implants and grafts: Secondary | ICD-10-CM | POA: Diagnosis not present

## 2016-08-23 DIAGNOSIS — T82898A Other specified complication of vascular prosthetic devices, implants and grafts, initial encounter: Secondary | ICD-10-CM

## 2016-08-23 DIAGNOSIS — I779 Disorder of arteries and arterioles, unspecified: Secondary | ICD-10-CM | POA: Diagnosis not present

## 2016-08-23 NOTE — Progress Notes (Signed)
[facility] [place]   CC: Follow up peripheral artery occlusive disease  History of Present Illness Todd [name] is a 55 y.o. male patient of Dr. Oneida Alar who is status post right femoral to below-knee popliteal bypass with non-reversed greater saphenous vein on 08/12/2014. He had peroneal and anterior tibial artery runoff. The patient states that his right foot is significantly improved.  He previously had a left femoral to above-knee popliteal bypass with PTFE and vein patch proximally and distally in 2013. He had single vessel anterior tibial runoff to the left foot. He has no symptoms of rest pain or ulcers in either foot at this point.  Unfortunately he continues to smoke. He denies shortness of breath. He denies chest pain.  Pt states he had a stroke in 2008 as manifested by right hemiparesis, still has mild right arm residual weakness, he also had speech difficulties which resolved after a year; he denies monocular vision changes. Only carotid Duplex result on file is from 2008: Mild bilateral carotid bifurcation plaque without hemodynamically significant stenosis. Pt states his ophthalmologist in [place] ordered a more recent carotid Duplex that he had done in January or February 2016, results not on file. Pt still sees a cardiologist but does not recall name, states he had a stress test about November 2015.  1-2 weeks ago his left leg started giving way after walking about 6 feet.   Pt Diabetic: No Pt smoker: Current smoker, resumed in April 2017, quit in July 2016 ( started at age 45 yrs, quit from 2009 to 2013). He took Chantix before and was able to quit using that.   Pt meds include: Statin :yes Betablocker: Yes ASA: yes Other anticoagulants/antiplatelets: no   Past Medical History:  Diagnosis Date  . CAD (coronary artery disease)    a. Cath 07/2012 following abnormal nuclear stress test: 20-30% LAD, 20-30% OM1, occluded branch off OM2 that fills  slowly via collaterals, small nondominant RCA occluded proximally, fills slowly via collaterals.  . CKD (chronic kidney disease), stage II   . Depression   . Hyperlipidemia   . Hypertension   . Peripheral vascular disease ([place])    a. bilateral superficial femoral artery stents in May/August of 2012. b. Left femoral to above-knee popliteal bypass in 08/2012.  Marland Kitchen Renal insufficiency   . Stroke [facility])    2008 right side weakness  . Tobacco abuse     Social History Social History  Substance Use Topics  . Smoking status: Current Every Day Smoker    Packs/day: 1.00    Years: 25.00    Types: Cigarettes    Start date: 11/16/1974  . Smokeless tobacco: Never Used  . Alcohol use No    Family History Family History  Problem Relation Age of Onset  . Diabetes Mother   . Heart disease Mother     before age 27- Triple BPG  . Cancer Mother     Luekemia  . Deep vein thrombosis Mother   . Hyperlipidemia Mother   . Hypertension Mother   . Heart attack Mother   . Peripheral vascular disease Mother   . Stroke Mother     x 3  . Arthritis Father     Past Surgical History:  Procedure Laterality Date  . ABDOMINAL AORTAGRAM N/A 04/27/2012   Procedure: ABDOMINAL Maxcine Ham;  Surgeon: Conrad Orchard Hill, MD;  Location: [facility] CATH LAB;  Service: Cardiovascular;  Laterality: N/A;  . ABDOMINAL AORTAGRAM N/A 08/09/2014   Procedure: ABDOMINAL AORTAGRAM;  Surgeon: Elam Dutch, MD;  Location: [facility] CATH LAB;  Service: Cardiovascular;  Laterality: N/A;  . CARDIAC CATHETERIZATION    . COLONOSCOPY    . FEMORAL ARTERY - POPLITEAL ARTERY BYPASS GRAFT    . FEMORAL-POPLITEAL BYPASS GRAFT  09/26/2012   Procedure: BYPASS GRAFT FEMORAL-POPLITEAL ARTERY;  Surgeon: Elam Dutch, MD;  [facility];  Service: Vascular;  Laterality: Left;  using 45mm Gore-Tex Propaten Ringed Graft, Vein patch on the proximal and distal anastomosis  . FEMORAL-POPLITEAL BYPASS GRAFT Right 08/12/2014   Procedure:  FEMORAL-POPLITEAL ARTERY  BYPASS WITH SAPHENOUS VEIN GRAFT, INTRAOPERATIVE ARTERIOGRAM;  Surgeon: Elam Dutch, MD;  Location: [place];  Service: Vascular;  Laterality: Right;  . INTRAOPERATIVE ARTERIOGRAM  08/12/2014   Procedure: INTRA OPERATIVE ARTERIOGRAM;  Surgeon: Elam Dutch, MD;  Location: [place];  Service: Vascular;;  . PR VEIN BYPASS GRAFT,AORTO-FEM-POP    . stents in bil legs      No Known Allergies  Current Outpatient Prescriptions  Medication Sig Dispense Refill  . amLODipine (NORVASC) 10 MG tablet Take 1 tablet (10 mg total) by mouth daily. 180 tablet 3  . aspirin EC 81 MG tablet Take 81 mg by mouth daily.    . CRESTOR 10 MG tablet TAKE ONE TABLET BY MOUTH ONCE DAILY 90 tablet 3  . lisinopril (PRINIVIL,ZESTRIL) 40 MG tablet Take 1 tablet (40 mg total) by mouth daily. 90 tablet 3  . valACYclovir (VALTREX) 500 MG tablet Take 500 mg by mouth at bedtime.    . famotidine (PEPCID) 20 MG tablet     . meloxicam (MOBIC) 15 MG tablet Take 1 tablet (15 mg total) by mouth daily. (Patient not taking: Reported on 08/23/2016) 30 tablet 3  . omeprazole (PRILOSEC) 20 MG capsule Take 1 capsule by mouth daily.    . promethazine (PHENERGAN) 25 MG tablet      No current facility-administered medications for this visit.     ROS: See HPI for pertinent positives and negatives.   Physical Examination  Vitals:   08/23/16 1116 08/23/16 1119  BP: 126/83 130/82  Pulse: 76   Temp: 97.4 F (36.3 C)   SpO2: 97%   Weight: 205 lb (93 kg)   Height: 6\' 1"  (1.854 m)    Body mass index is 27.05 kg/m.  General: A&O x 3, WDWN. Strong odor of cigarette smoke about pt.  Gait: normal Eyes: PERRLA. Pulmonary: Respirations are non labored, CTAB with slightly decreased air movement, without wheezes , rales or rhonchi. Cardiac: regular rhythm, no detected murmur.     Carotid Bruits Right Left   negative Negative  Aorta is not palpable. Radial pulses: 2+ palpable and =   VASCULAR  EXAM: Extremities without ischemic changes. without Gangrene; without open wounds.     LE Pulses Right Left   FEMORAL 2+ palpable 2+ palpable    POPLITEAL 2+ palpable  not palpable   POSTERIOR TIBIAL not palpable  not palpable    DORSALIS PEDIS  ANTERIOR TIBIAL 2+ palpable  not palpable     Abdomen: soft, NT, no palpable masses. Skin: no rashes, no ulcers. Musculoskeletal: no muscle wasting or atrophy. Neurologic: A&O X 3; Appropriate Affect, MOTOR FUNCTION: moving all extremities equally, motor strength 5/5 throughout. Speech is fluent/normal. CN 2-12 intact.    ASSESSMENT: Todd [name] is a 55 y.o. male who is status post right femoral to below-knee popliteal bypass with non-reversed greater saphenous vein on 08/12/2014 and left femoropopliteal arterial bypass graft placed 09/26/2012.  There are no signs of ischemia in his feet/legs.   1-2 weeks ago his left leg started giving way after walking about 6 feet.    Dicussed with Dr. Trula Slade, then Dr. Donnetta Hutching via OR personnel as he is performing surgery now, then again Dr. Trula Slade.  Dr. Donnetta Hutching called me after he was out of surgery: states he will look at the schedule for possible arteriogram or endarterectomy.   DATA 08-18-16 bilateral LE arterial duplex demonstrates right femoropopliteal arterial bypass graft is widely patent without evidence of restenosis or hyperplasia. Left LE arterial duplex indicates no blood flow throughout the left fem-pop bypass graft.  Right ABI is 0.96 ( 1.0 on 12-11-14) with absent PT waveform and triphasic AT waveform, normal TBI at 0.91. Left ABI declined to 0.67 from 1.0, absent PT waveform, monophasic AT waveform. TBI is 0.55.   At his 12-05-14 visit with Dr. Oneida Alar, bilateral LE bypass grafts were patent  with triphasic AT waveforms bilaterally.  At pt's 01-02-16 visit his left ABI: PT was 0.89 with monophasic waveforms, AT was 1.04 with triphasic waveforms, TBI was normal at 1.22.     PLAN:  Based on the patient's vascular studies and examination, pt will return to clinic at Dr. Oneida Alar first available appointment in the office to discuss how to address occluded left fem-pop bypass graft.  The patient was counseled re smoking cessation and given several free resources re smoking cessation.  I discussed in depth with the patient the nature of atherosclerosis, and emphasized the importance of maximal medical management including strict control of blood pressure, blood glucose, and lipid levels, obtaining regular exercise, and cessation of smoking.  The patient is aware that without maximal medical management the underlying atherosclerotic disease process will progress, limiting the benefit of any interventions.  The patient was given information about PAD including signs, symptoms, treatment, what symptoms should prompt the patient to seek immediate medical care, and risk reduction measures to take.  Clemon Chambers, RN, MSN, FNP-C [facility] Phone: [phone]  Clinic MD: Trula Slade  08/23/16 11:46 AM

## 2016-08-23 NOTE — Patient Instructions (Addendum)
Peripheral Vascular Disease Peripheral vascular disease (PVD) is a disease of the blood vessels that are not part of your heart and brain. A simple term for PVD is poor circulation. In most cases, PVD narrows the blood vessels that carry blood from your heart to the rest of your body. This can result in a decreased supply of blood to your arms, legs, and internal organs, like your stomach or kidneys. However, it most often affects a person's lower legs and feet. There are two types of PVD.  Organic PVD. This is the more common type. It is caused by damage to the structure of blood vessels.  Functional PVD. This is caused by conditions that make blood vessels contract and tighten (spasm). Without treatment, PVD tends to get worse over time. PVD can also lead to acute ischemic limb. This is when an arm or limb suddenly has trouble getting enough blood. This is a medical emergency. Follow these instructions at home:  Take medicines only as told by your doctor.  Do not use any tobacco products, including cigarettes, chewing tobacco, or electronic cigarettes. If you need help quitting, ask your doctor.  Lose weight if you are overweight, and maintain a healthy weight as told by your doctor.  Eat a diet that is low in fat and cholesterol. If you need help, ask your doctor.  Exercise regularly. Ask your doctor for some good activities for you.  Take good care of your feet.  Wear comfortable shoes that fit well.  Check your feet often for any cuts or sores. Contact a doctor if:  You have cramps in your legs while walking.  You have leg pain when you are at rest.  You have coldness in a leg or foot.  Your skin changes.  You are unable to get or have an erection (erectile dysfunction).  You have cuts or sores on your feet that are not healing. Get help right away if:  Your arm or leg turns cold and blue.  Your arms or legs become red, warm, swollen, painful, or numb.  You have  chest pain or trouble breathing.  You suddenly have weakness in your face, arm, or leg.  You become very confused or you cannot speak.  You suddenly have a very bad headache.  You suddenly cannot see. This information is not intended to replace advice given to you by your health care provider. Make sure you discuss any questions you have with your health care provider. Document Released: 12/08/2009 Document Revised: 02/19/2016 Document Reviewed: 02/21/2014 Elsevier Interactive Patient Education  2017 Elsevier Inc.      Steps to Quit Smoking Smoking tobacco can be bad for your health. It can also affect almost every organ in your body. Smoking puts you and people around you at risk for many serious long-lasting (chronic) diseases. Quitting smoking is hard, but it is one of the best things that you can do for your health. It is never too late to quit. What are the benefits of quitting smoking? When you quit smoking, you lower your risk for getting serious diseases and conditions. They can include:  Lung cancer or lung disease.  Heart disease.  Stroke.  Heart attack.  Not being able to have children (infertility).  Weak bones (osteoporosis) and broken bones (fractures). If you have coughing, wheezing, and shortness of breath, those symptoms may get better when you quit. You may also get sick less often. If you are pregnant, quitting smoking can help to lower your chances   of having a baby of low birth weight. What can I do to help me quit smoking? Talk with your doctor about what can help you quit smoking. Some things you can do (strategies) include:  Quitting smoking totally, instead of slowly cutting back how much you smoke over a period of time.  Going to in-person counseling. You are more likely to quit if you go to many counseling sessions.  Using resources and support systems, such as:  Online chats with a counselor.  Phone quitlines.  Printed self-help  materials.  Support groups or group counseling.  Text messaging programs.  Mobile phone apps or applications.  Taking medicines. Some of these medicines may have nicotine in them. If you are pregnant or breastfeeding, do not take any medicines to quit smoking unless your doctor says it is okay. Talk with your doctor about counseling or other things that can help you. Talk with your doctor about using more than one strategy at the same time, such as taking medicines while you are also going to in-person counseling. This can help make quitting easier. What things can I do to make it easier to quit? Quitting smoking might feel very hard at first, but there is a lot that you can do to make it easier. Take these steps:  Talk to your family and friends. Ask them to support and encourage you.  Call phone quitlines, reach out to support groups, or work with a counselor.  Ask people who smoke to not smoke around you.  Avoid places that make you want (trigger) to smoke, such as:  Bars.  Parties.  Smoke-break areas at work.  Spend time with people who do not smoke.  Lower the stress in your life. Stress can make you want to smoke. Try these things to help your stress:  Getting regular exercise.  Deep-breathing exercises.  Yoga.  Meditating.  Doing a body scan. To do this, close your eyes, focus on one area of your body at a time from head to toe, and notice which parts of your body are tense. Try to relax the muscles in those areas.  Download or buy apps on your mobile phone or tablet that can help you stick to your quit plan. There are many free apps, such as QuitGuide from the CDC (Centers for Disease Control and Prevention). You can find more support from smokefree.gov and other websites. This information is not intended to replace advice given to you by your health care provider. Make sure you discuss any questions you have with your health care provider. Document Released:  07/10/2009 Document Revised: 05/11/2016 Document Reviewed: 01/28/2015 Elsevier Interactive Patient Education  2017 Elsevier Inc.  

## 2016-08-24 ENCOUNTER — Other Ambulatory Visit: Payer: Self-pay

## 2016-08-25 ENCOUNTER — Ambulatory Visit (HOSPITAL_COMMUNITY)
Admission: RE | Admit: 2016-08-25 | Discharge: 2016-08-25 | Disposition: A | Discharge status: Home / Self Care | Payer: Medicare Other | Source: Ambulatory Visit | Attending: Vascular Surgery | Admitting: Vascular Surgery

## 2016-08-25 ENCOUNTER — Encounter (HOSPITAL_COMMUNITY)
Admission: RE | Disposition: A | Discharge status: Home / Self Care | Payer: Self-pay | Source: Ambulatory Visit | Attending: Vascular Surgery

## 2016-08-25 ENCOUNTER — Encounter: Payer: Self-pay | Admitting: Vascular Surgery

## 2016-08-25 ENCOUNTER — Encounter (HOSPITAL_COMMUNITY): Payer: Self-pay | Admitting: *Deleted

## 2016-08-25 ENCOUNTER — Telehealth: Payer: Self-pay

## 2016-08-25 DIAGNOSIS — I739 Peripheral vascular disease, unspecified: Secondary | ICD-10-CM | POA: Diagnosis present

## 2016-08-25 DIAGNOSIS — Z5329 Procedure and treatment not carried out because of patient's decision for other reasons: Secondary | ICD-10-CM | POA: Insufficient documentation

## 2016-08-25 LAB — POCT I-STAT, CHEM 8
BUN: 14 mg/dL (ref 6–20)
CHLORIDE: 103 mmol/L (ref 101–111)
CREATININE: 1.1 mg/dL (ref 0.61–1.24)
Calcium, Ion: 1.22 mmol/L (ref 1.15–1.40)
GLUCOSE: 80 mg/dL (ref 65–99)
HEMATOCRIT: 41 % (ref 39.0–52.0)
Hemoglobin: 13.9 g/dL (ref 13.0–17.0)
POTASSIUM: 3.6 mmol/L (ref 3.5–5.1)
Sodium: 139 mmol/L (ref 135–145)
TCO2: 25 mmol/L (ref 0–100)

## 2016-08-25 SURGERY — ABDOMINAL AORTOGRAM W/LOWER EXTREMITY

## 2016-08-25 MED ORDER — SODIUM CHLORIDE 0.9 % IV SOLN
INTRAVENOUS | Status: DC
  Administered 2016-08-25: 11:00:00 via INTRAVENOUS

## 2016-08-25 NOTE — H&P (Addendum)
    Patient apparently angry in preop holding and has left prior to procedure.  Will call office to reschedule.  Timmy Cleverly C. Donzetta Matters, MD [facility] [place] Office: [phone] Pager: [phone]

## 2016-08-25 NOTE — Progress Notes (Signed)
Pt wants to go// states he is hungry, Dr Donzetta Matters and Julio Alm rn informed, pt ambulated to dc without incident.

## 2016-08-25 NOTE — Telephone Encounter (Signed)
Mr. Todd [name] was scheduled for an aortogram; possible lysis with Dr. Donzetta Matters today. Per Dr. Oneida Alar, patient left [facility] prior to having procedure and needs to be scheduled for an office visit with him tomorrow. Spoke with Mr. Todd [name] regarding Dr. Oneida Alar request. Patient states that he will be at appointment tomorrow, as long as our office can arrange transportation. Lillia Carmel, front office representative, spoke with [facility] transportation and faxed medical necessity form, as per their request. Mr. Todd [name] notified of appointment time; verbalized understanding.

## 2016-08-26 ENCOUNTER — Encounter: Payer: Self-pay | Admitting: Vascular Surgery

## 2016-08-26 ENCOUNTER — Ambulatory Visit (INDEPENDENT_AMBULATORY_CARE_PROVIDER_SITE_OTHER): Payer: Medicare Other | Admitting: Vascular Surgery

## 2016-08-26 VITALS — BP 162/102 | HR 87 | Temp 98.5°F | Resp 20 | Ht 73.0 in | Wt 201.0 lb

## 2016-08-26 DIAGNOSIS — I739 Peripheral vascular disease, unspecified: Secondary | ICD-10-CM | POA: Diagnosis not present

## 2016-08-26 MED ORDER — VARENICLINE TARTRATE 0.5 MG X 11 & 1 MG X 42 PO MISC: ORAL | 0 refills | Status: DC

## 2016-08-26 NOTE — Progress Notes (Signed)
HPI: Todd [name] is a 55 y.o. male patient of Dr. Oneida Alar who is status post right femoral to below-knee popliteal bypass with non-reversed greater saphenous vein on 08/12/2014. He had peroneal and anterior tibial artery runoff. The patient states that his right foot is significantly improved.  He previously had a left femoral to above-knee popliteal bypass with PTFE and vein patch proximally and distally in 2013. He had single vessel anterior tibial runoff to the left foot.  He was scheduled for an angiogram with Dr. Donzetta Matters after seeing Vinnie Level 08/23/2016.  He left the hospital before the procedure could be completed.  He is here today for follow up.  He reports no claudication symptoms, no rest pain and no open wounds.  His reported symptoms are 3 falls in 1 day 3 weeks ago.  Unknown cause.  He has not fallen since and can walk any distance he likes without pain.    Pt Diabetic: No Pt smoker: Current smoker, resumed in April 2017, quit in July 2016 ( started at age 55 yrs, quit from 2009 to 2013). He took Chantix before and was able to quit using that.   Pt meds include: Statin :yes Betablocker: Yes ASA: yes Other anticoagulants/antiplatelets: no    Past Medical History:  Diagnosis Date  . CAD (coronary artery disease)    a. Cath 07/2012 following abnormal nuclear stress test: 20-30% LAD, 20-30% OM1, occluded branch off OM2 that fills slowly via collaterals, small nondominant RCA occluded proximally, fills slowly via collaterals.  . CKD (chronic kidney disease), stage II   . Depression   . Hyperlipidemia   . Hypertension   . Peripheral vascular disease ([place])    a. bilateral superficial femoral artery stents in May/August of 2012. b. Left femoral to above-knee popliteal bypass in 08/2012.  Marland Kitchen Renal insufficiency   . Stroke [facility])    2008 right side weakness  . Tobacco abuse     Past Surgical History:  Procedure Laterality Date  . ABDOMINAL AORTAGRAM N/A 04/27/2012   Procedure:  ABDOMINAL Maxcine Ham;  Surgeon: Conrad LaBelle, MD;  Location: [facility] CATH LAB;  Service: Cardiovascular;  Laterality: N/A;  . ABDOMINAL AORTAGRAM N/A 08/09/2014   Procedure: ABDOMINAL Maxcine Ham;  Surgeon: Elam Dutch, MD;  Location: [facility] CATH LAB;  Service: Cardiovascular;  Laterality: N/A;  . CARDIAC CATHETERIZATION    . COLONOSCOPY    . FEMORAL ARTERY - POPLITEAL ARTERY BYPASS GRAFT    . FEMORAL-POPLITEAL BYPASS GRAFT  09/26/2012   Procedure: BYPASS GRAFT FEMORAL-POPLITEAL ARTERY;  Surgeon: Elam Dutch, MD;  Location: [facility] OR;  Service: Vascular;  Laterality: Left;  using 7mm Gore-Tex Propaten Ringed Graft, Vein patch on the proximal and distal anastomosis  . FEMORAL-POPLITEAL BYPASS GRAFT Right 08/12/2014   Procedure:  FEMORAL-POPLITEAL ARTERY BYPASS WITH SAPHENOUS VEIN GRAFT, INTRAOPERATIVE ARTERIOGRAM;  Surgeon: Elam Dutch, MD;  Location: [place];  Service: Vascular;  Laterality: Right;  . INTRAOPERATIVE ARTERIOGRAM  08/12/2014   Procedure: INTRA OPERATIVE ARTERIOGRAM;  Surgeon: Elam Dutch, MD;  Location: [place];  Service: Vascular;;  . PR VEIN BYPASS GRAFT,AORTO-FEM-POP    . stents in bil legs      ROS:   General:  No weight loss, Fever, chills  HEENT: No recent headaches, no nasal bleeding, no visual changes, no sore throat  Neurologic: No dizziness, blackouts, seizures. No recent symptoms of stroke or mini- stroke. No recent episodes of slurred speech, or temporary blindness.  Cardiac: No recent episodes of chest pain/pressure, no  shortness of breath at rest.  No shortness of breath with exertion.  Denies history of atrial fibrillation or irregular heartbeat  Vascular: No history of rest pain in feet.  No history of claudication.  No history of non-healing ulcer, No history of DVT   Pulmonary: No home oxygen, no productive cough, no hemoptysis,  No asthma or wheezing  Musculoskeletal:  [ ]  Arthritis, [ ]  Low back pain,  [ ]  Joint pain  Hematologic:No history of  hypercoagulable state.  No history of easy bleeding.  No history of anemia  Gastrointestinal: No hematochezia or melena,  No gastroesophageal reflux, no trouble swallowing  Urinary: [ ]  chronic Kidney disease, [ ]  on HD - [ ]  MWF or [ ]  TTHS, [ ]  Burning with urination, [ ]  Frequent urination, [ ]  Difficulty urinating;   Skin: No rashes  Psychological: No history of anxiety,  No history of depression  Social History Social History  Substance Use Topics  . Smoking status: Current Every Day Smoker    Packs/day: 1.00    Years: 25.00    Types: Cigarettes    Start date: 11/16/1974  . Smokeless tobacco: Never Used  . Alcohol use No    Family History Family History  Problem Relation Age of Onset  . Diabetes Mother   . Heart disease Mother     before age 68- Triple BPG  . Cancer Mother     Luekemia  . Deep vein thrombosis Mother   . Hyperlipidemia Mother   . Hypertension Mother   . Heart attack Mother   . Peripheral vascular disease Mother   . Stroke Mother     x 3  . Arthritis Father     Allergies  No Known Allergies   Current Outpatient Prescriptions  Medication Sig Dispense Refill  . amLODipine (NORVASC) 10 MG tablet Take 1 tablet (10 mg total) by mouth daily. 180 tablet 3  . aspirin EC 81 MG tablet Take 81 mg by mouth daily.    . CRESTOR 10 MG tablet TAKE ONE TABLET BY MOUTH ONCE DAILY (Patient taking differently: Take 10mg s at night) 90 tablet 3  . lisinopril (PRINIVIL,ZESTRIL) 40 MG tablet Take 1 tablet (40 mg total) by mouth daily. 90 tablet 3  . valACYclovir (VALTREX) 500 MG tablet Take 500 mg by mouth at bedtime.    . meloxicam (MOBIC) 15 MG tablet Take 1 tablet (15 mg total) by mouth daily. (Patient not taking: Reported on 08/26/2016) 30 tablet 3   No current facility-administered medications for this visit.     Physical Examination  Vitals:   08/26/16 1425 08/26/16 1429  BP: (!) 153/100 (!) 162/102  Pulse: 87   Resp: 20   Temp: 98.5 F (36.9 C)     TempSrc: Oral   SpO2: 98%   Weight: 201 lb (91.2 kg)   Height: 6\' 1"  (1.854 m)     Body mass index is 26.52 kg/m.  General:  Alert and oriented, no acute distress HEENT: Normal Neck: No bruit or JVD Pulmonary: Clear to auscultation bilaterally Cardiac: Regular Rate and Rhythm without murmur Abdomen: Soft, non-tender, non-distended, no mass, no scars Skin: No rash, no open wounds on bilateral LE's. Extremity Pulses:   Palpable femoral, non palpable dorsalis pedis, posterior tibial pulses bilaterally.  Bilateral feet are warm to touch and well perfused.  Musculoskeletal: No deformity or edema  Neurologic: Upper and lower extremity motor 5/5 and symmetric  DATA:  08/18/2016 Left ABI declined to 0.67  ASSESSMENT:  Asymptomatic PAD s/p previous left fem-pop by pass that is now occluded.     PLAN:  He has 67% arterial blood flow to his left foot.  He is asymptomatic without claudication or rest pain.  He has no history of open wounds and none currently.  He is not at risk of limb loss currently.   Dr. Oneida Alar discussed his choose of continued daily activity and keeping a close watch on his left LE. If he has pain with walking or pain that wakes him from sleep with open wound s that don't heal he will need a re-do fem-pop by pass.  Other wise if he opted for a re-do by pass now without symptoms the likely hood of long term success to keep the by pass open is not good.  With each intervention the more  likely the by pass will fails.  He also agreed to stop smoking and was given a prescription for Chantix.    He will return in 1 year for repeat ABI's.   Theda Sers, EMMA MAUREEN PA-C [facility]  The patient was seen today in conjunction with Dr. Oneida Alar  Patient had several falls a few weeks ago. He was scheduled for an arteriogram yesterday but left because he didn't want to wait for the study. He denies any claudication symptoms. He has no rest pain. His  ABI shows adequate perfusion to his left leg. I believe at this point before embarking on a redo femoropopliteal bypass for an asystematic leg the best option at this point would be watchful waiting. The patient will follow-up in one year with repeat ABIs. I would only consider a redo bypass for limb threatening ischemia. He will also try to quit smoking. He was given a prescription for Chantix today.  Ruta Hinds, MD [facility] Office: [phone] Pager: [phone]

## 2016-09-13 ENCOUNTER — Ambulatory Visit: Payer: Medicare Other | Admitting: Vascular Surgery

## 2016-10-26 ENCOUNTER — Ambulatory Visit: Payer: Medicare Other | Admitting: Cardiology

## 2016-10-28 ENCOUNTER — Other Ambulatory Visit: Payer: Self-pay | Admitting: Cardiology

## 2016-11-26 ENCOUNTER — Ambulatory Visit: Payer: Medicare Other | Admitting: Cardiology

## 2016-11-26 DIAGNOSIS — Z720 Tobacco use: Secondary | ICD-10-CM | POA: Diagnosis not present

## 2016-11-26 DIAGNOSIS — I10 Essential (primary) hypertension: Secondary | ICD-10-CM | POA: Diagnosis not present

## 2016-11-26 DIAGNOSIS — I2510 Atherosclerotic heart disease of native coronary artery without angina pectoris: Secondary | ICD-10-CM | POA: Diagnosis not present

## 2016-11-26 DIAGNOSIS — I639 Cerebral infarction, unspecified: Secondary | ICD-10-CM | POA: Diagnosis not present

## 2016-11-26 DIAGNOSIS — E782 Mixed hyperlipidemia: Secondary | ICD-10-CM | POA: Diagnosis not present

## 2016-11-27 ENCOUNTER — Other Ambulatory Visit: Payer: Self-pay | Admitting: Cardiology

## 2016-12-09 ENCOUNTER — Ambulatory Visit (INDEPENDENT_AMBULATORY_CARE_PROVIDER_SITE_OTHER): Payer: Medicare Other | Admitting: Cardiology

## 2016-12-09 VITALS — BP 126/74 | HR 82 | Ht 73.0 in | Wt 211.2 lb

## 2016-12-09 DIAGNOSIS — I10 Essential (primary) hypertension: Secondary | ICD-10-CM

## 2016-12-09 DIAGNOSIS — E782 Mixed hyperlipidemia: Secondary | ICD-10-CM

## 2016-12-09 DIAGNOSIS — I2510 Atherosclerotic heart disease of native coronary artery without angina pectoris: Secondary | ICD-10-CM

## 2016-12-09 DIAGNOSIS — N182 Chronic kidney disease, stage 2 (mild): Secondary | ICD-10-CM | POA: Diagnosis not present

## 2016-12-09 NOTE — Progress Notes (Signed)
Clinical Summary Todd [name] is a 56 y.o.male seen today for follow up of the following medical problems.   1. HTN  -home bps 120s-130s/80-90s - remains compliant with bp meds   2. HL - compliant with statin - 04/2016 TC 140 HDL 33 TGs 151 LDL 77  3. PAD - hx of bilateral SFA stents in 2012, left femoral to above knee popliteteal bypass 2013 and right femoral to below knee bypass 07/2014 - no recent claudication pain or foot sores.   4. CAD - cath 2013 with patent vessels other than small OM Todd [name] that was occluded and an occluded small non-dominant RCA. LVEF by LVgram 65%.  - Patient has small area of ischemia on MPI 07/2014 related to this disease that has been managed medically.   - denies any chest pain since last visit  5. CKD stage II   6. Tobacco abuse - taking chantix  SH: retired Camera operator   Past Medical History:  Diagnosis Date  . CAD (coronary artery disease)    a. Cath 07/2012 following abnormal nuclear stress test: 20-30% LAD, 20-30% OM1, occluded Todd [name] off OM2 that fills slowly via collaterals, small nondominant RCA occluded proximally, fills slowly via collaterals.  . CKD (chronic kidney disease), stage II   . Depression   . Hyperlipidemia   . Hypertension   . Peripheral vascular disease ([place])    a. bilateral superficial femoral artery stents in May/August of 2012. b. Left femoral to above-knee popliteal bypass in 08/2012.  Marland Kitchen Renal insufficiency   . Stroke [name])    2008 right side weakness  . Tobacco abuse      No Known Allergies   Current Outpatient Prescriptions  Medication Sig Dispense Refill  . amLODipine (NORVASC) 10 MG tablet TAKE ONE TABLET BY MOUTH ONCE DAILY 90 tablet 3  . aspirin EC 81 MG tablet Take 81 mg by mouth daily.    . CRESTOR 10 MG tablet TAKE ONE TABLET BY MOUTH ONCE DAILY (Patient taking differently: Take 10mg s at night) 90 tablet 3  . lisinopril (PRINIVIL,ZESTRIL) 40 MG tablet TAKE ONE TABLET BY MOUTH  ONCE DAILY 90 tablet 1  . meloxicam (MOBIC) 15 MG tablet Take 1 tablet (15 mg total) by mouth daily. (Patient not taking: Reported on 08/26/2016) 30 tablet 3  . valACYclovir (VALTREX) 500 MG tablet Take 500 mg by mouth at bedtime.    . varenicline (CHANTIX STARTING MONTH PAK) 0.5 MG X 11 & 1 MG X 42 tablet Take one 0.5 mg tablet by mouth once daily for 3 days, then increase to one 0.5 mg tablet twice daily for 4 days, then increase to one 1 mg tablet twice daily. 53 tablet 0   No current facility-administered medications for this visit.      Past Surgical History:  Procedure Laterality Date  . ABDOMINAL AORTAGRAM N/A 04/27/2012   Procedure: ABDOMINAL Maxcine Ham;  Surgeon: Conrad [place], MD;  Location: [facility] CATH LAB;  Service: Cardiovascular;  Laterality: N/A;  . ABDOMINAL AORTAGRAM N/A 08/09/2014   Procedure: ABDOMINAL Maxcine Ham;  Surgeon: Elam Dutch, MD;  Location: [name] C[facility] LAB;  Service: Cardiovascular;  Laterality: N/A;  . CARDIAC CATHETERIZATION    . COLONOSCOPY    . FEMORAL ARTERY - POPLITEAL ARTERY BYPASS GRAFT    . FEMORAL-POPLITEAL BYPASS GRAFT  09/26/2012   Procedure: BYPASS GRAFT FEMORAL-POPLITEAL ARTERY;  Surgeon: Elam Dutch, MD;  Location: [name] OR;  Service: Vascular;  Laterality: Left;  using 19mm Gore-Tex Propaten Ringed  Graft, Vein patch on the proximal and distal anastomosis  . FEMORAL-POPLITEAL BYPASS GRAFT Right 08/12/2014   Procedure:  FEMORAL-POPLITEAL ARTERY BYPASS WITH SAPHENOUS VEIN GRAFT, INTRAOPERATIVE ARTERIOGRAM;  Surgeon: Elam Dutch, MD;  Location: [place];  Service: Vascular;  Laterality: Right;  . INTRAOPERATIVE ARTERIOGRAM  08/12/2014   Procedure: INTRA OPERATIVE ARTERIOGRAM;  Surgeon: Elam Dutch, MD;  Location: [place];  Service: Vascular;;  . PR VEIN BYPASS GRAFT,AORTO-FEM-POP    . stents in bil legs       No Known Allergies    Family History  Problem Relation Age of Onset  . Diabetes Mother   . Heart disease Mother     before age 82-  Triple BPG  . Cancer Mother     Luekemia  . Deep vein thrombosis Mother   . Hyperlipidemia Mother   . Hypertension Mother   . Heart attack Mother   . Peripheral vascular disease Mother   . Stroke Mother     x 3  . Arthritis Father      Social History Todd [name] reports that he has been smoking Cigarettes.  He started smoking about 42 years ago. He has a 25.00 pack-year smoking history. He has never used smokeless tobacco. Todd [name] reports that he does not drink alcohol.   Review of Systems CONSTITUTIONAL: No weight loss, fever, chills, weakness or fatigue.  HEENT: Eyes: No visual loss, blurred vision, double vision or yellow sclerae.No hearing loss, sneezing, congestion, runny nose or sore throat.  SKIN: No rash or itching.  CARDIOVASCULAR: per hpi RESPIRATORY: No shortness of breath, cough or sputum.  GASTROINTESTINAL: No anorexia, nausea, vomiting or diarrhea. No abdominal pain or blood.  GENITOURINARY: No burning on urination, no polyuria NEUROLOGICAL: No headache, dizziness, syncope, paralysis, ataxia, numbness or tingling in the extremities. No change in bowel or bladder control.  MUSCULOSKELETAL: No muscle, back pain, joint pain or stiffness.  LYMPHATICS: No enlarged nodes. No history of splenectomy.  PSYCHIATRIC: No history of depression or anxiety.  ENDOCRINOLOGIC: No reports of sweating, cold or heat intolerance. No polyuria or polydipsia.  Marland Kitchen   Physical Examination Vitals:   12/09/16 1103  BP: 126/74  Pulse: 82   Vitals:   12/09/16 1103  Weight: 211 lb 3.2 oz (95.8 kg)  Height: 6\' 1"  (1.854 m)    Gen: resting comfortably, no acute distress HEENT: no scleral icterus, pupils equal round and reactive, no palptable cervical adenopathy,  CV: RRR, no m/r/g no jvd Resp: Clear to auscultation bilaterally GI: abdomen is soft, non-tender, non-distended, normal bowel sounds, no hepatosplenomegaly MSK: extremities are warm, no edema.  Skin: warm, no rash Neuro:  no  focal deficits Psych: appropriate affect   Diagnostic Studies  07/2012 Cath Hemodynamics:   LV pressure:115/23 Aortic pressure:111/69  Angiography   Left Main:LM normal  Left anterior Descending:large, minor luminal irregularities. 20-30% stenosis diffusely. Several small diagonals which have only minor luminal irreg.  Left Circumflex:large and dominant, 20-30%, OM 1 is small and normal, OM2 is a small vessel. There is a small Todd [name] off the OM2 that is occluded and fills slowly via collaterals. PDA is unremarkable  Right Coronary Artery:small , nondominant, occluded Proximally, fills slowly via collaterals  LV Gram:overall normal LV function . EF 96%  Complications: No apparent complications Patient did tolerate procedure well.  Contrast used:110 CC  Conclusions:  1. Mild- moderate irregularities. The large LAD and the large dominant LCx have only minor luminal irregularities. He has a small OM Todd [name] that  is occluded and a small non dominant RCA that is also occluded. These are likely the source of his abnormal myoview.  2. Normal LV function.   He should be at low risk for his upcoming vascular surgery.   07/2014 MPI IMPRESSION: 1. Abnormal study. There is a small region of ischemia in the inferolateral wall which would be reasonably consistent with the patient's known history of CAD.  2. Normal left ventricular wall motion.  3. Left ventricular ejection fraction 61%  4. Low-risk stress test findings*.     Assessment and Plan  1. HTN - his bp is at goal, continue current meds  2. HL -continue statin, lipids are at goal  3. PAD - continue to follow with vascular.   4. CAD - no current symptoms, he will continue current meds - EKG in clinic shows SR, no ischemic changes  F/u 6 months.       Arnoldo Lenis, M.D.

## 2016-12-09 NOTE — Patient Instructions (Signed)

## 2016-12-20 DIAGNOSIS — I6350 Cerebral infarction due to unspecified occlusion or stenosis of unspecified cerebral artery: Secondary | ICD-10-CM | POA: Insufficient documentation

## 2016-12-21 ENCOUNTER — Encounter: Payer: Self-pay | Admitting: Cardiology

## 2016-12-21 DIAGNOSIS — E782 Mixed hyperlipidemia: Secondary | ICD-10-CM | POA: Diagnosis not present

## 2016-12-21 DIAGNOSIS — I2510 Atherosclerotic heart disease of native coronary artery without angina pectoris: Secondary | ICD-10-CM | POA: Diagnosis not present

## 2016-12-21 DIAGNOSIS — I10 Essential (primary) hypertension: Secondary | ICD-10-CM | POA: Diagnosis not present

## 2016-12-21 DIAGNOSIS — I639 Cerebral infarction, unspecified: Secondary | ICD-10-CM | POA: Diagnosis not present

## 2016-12-21 DIAGNOSIS — Z0000 Encounter for general adult medical examination without abnormal findings: Secondary | ICD-10-CM | POA: Diagnosis not present

## 2017-05-09 ENCOUNTER — Encounter (HOSPITAL_COMMUNITY): Payer: Self-pay | Admitting: Emergency Medicine

## 2017-05-09 ENCOUNTER — Emergency Department (HOSPITAL_COMMUNITY): Payer: Medicare Other

## 2017-05-09 ENCOUNTER — Emergency Department (HOSPITAL_COMMUNITY)
Admission: EM | Admit: 2017-05-09 | Discharge: 2017-05-09 | Disposition: A | Discharge status: Home Health | Payer: Medicare Other | Attending: Emergency Medicine | Admitting: Emergency Medicine

## 2017-05-09 DIAGNOSIS — Z87891 Personal history of nicotine dependence: Secondary | ICD-10-CM | POA: Insufficient documentation

## 2017-05-09 DIAGNOSIS — I2510 Atherosclerotic heart disease of native coronary artery without angina pectoris: Secondary | ICD-10-CM | POA: Diagnosis not present

## 2017-05-09 DIAGNOSIS — I129 Hypertensive chronic kidney disease with stage 1 through stage 4 chronic kidney disease, or unspecified chronic kidney disease: Secondary | ICD-10-CM | POA: Diagnosis not present

## 2017-05-09 DIAGNOSIS — N182 Chronic kidney disease, stage 2 (mild): Secondary | ICD-10-CM | POA: Insufficient documentation

## 2017-05-09 DIAGNOSIS — R1013 Epigastric pain: Secondary | ICD-10-CM | POA: Diagnosis not present

## 2017-05-09 DIAGNOSIS — S8992XA Unspecified injury of left lower leg, initial encounter: Secondary | ICD-10-CM | POA: Diagnosis not present

## 2017-05-09 DIAGNOSIS — M25562 Pain in left knee: Secondary | ICD-10-CM | POA: Diagnosis not present

## 2017-05-09 DIAGNOSIS — R109 Unspecified abdominal pain: Secondary | ICD-10-CM | POA: Diagnosis present

## 2017-05-09 LAB — COMPREHENSIVE METABOLIC PANEL
ALBUMIN: 4.4 g/dL (ref 3.5–5.0)
ALK PHOS: 50 U/L (ref 38–126)
ALT: 18 U/L (ref 17–63)
ANION GAP: 9 (ref 5–15)
AST: 24 U/L (ref 15–41)
BILIRUBIN TOTAL: 0.5 mg/dL (ref 0.3–1.2)
BUN: 13 mg/dL (ref 6–20)
CALCIUM: 9.6 mg/dL (ref 8.9–10.3)
CO2: 26 mmol/L (ref 22–32)
Chloride: 104 mmol/L (ref 101–111)
Creatinine, Ser: 1.23 mg/dL (ref 0.61–1.24)
GFR calc Af Amer: >60 mL/min (ref >60)
GLUCOSE: 94 mg/dL (ref 65–99)
Potassium: 3.8 mmol/L (ref 3.5–5.1)
Sodium: 139 mmol/L (ref 135–145)
TOTAL PROTEIN: 8 g/dL (ref 6.5–8.1)

## 2017-05-09 LAB — CBC WITH DIFFERENTIAL/PLATELET
Basophils Absolute: 0 10*3/uL (ref 0.0–0.1)
Basophils Relative: 0 %
EOS ABS: 0.1 10*3/uL (ref 0.0–0.7)
Eosinophils Relative: 1 %
HCT: 38.5 % — ABNORMAL LOW (ref 39.0–52.0)
Hemoglobin: 13.1 g/dL (ref 13.0–17.0)
Lymphocytes Relative: 31 %
Lymphs Abs: 2.9 10*3/uL (ref 0.7–4.0)
MCH: 30.5 pg (ref 26.0–34.0)
MCHC: 34 g/dL (ref 30.0–36.0)
MCV: 89.7 fL (ref 78.0–100.0)
MONO ABS: 0.5 10*3/uL (ref 0.1–1.0)
MONOS PCT: 6 %
NEUTROS PCT: 62 %
Neutro Abs: 5.9 10*3/uL (ref 1.7–7.7)
Platelets: 244 10*3/uL (ref 150–400)
RBC: 4.29 MIL/uL (ref 4.22–5.81)
RDW: 13.9 % (ref 11.5–15.5)
WBC: 9.5 10*3/uL (ref 4.0–10.5)

## 2017-05-09 LAB — LIPASE, BLOOD: Lipase: 40 U/L (ref 11–51)

## 2017-05-09 MED ORDER — GI COCKTAIL ~~LOC~~
30.0000 mL | Freq: Once | ORAL | Status: AC
  Administered 2017-05-09: 30 mL via ORAL
  Filled 2017-05-09: qty 30

## 2017-05-09 MED ORDER — DICYCLOMINE HCL 10 MG PO CAPS
10.0000 mg | ORAL_CAPSULE | Freq: Once | ORAL | Status: AC
  Administered 2017-05-09: 10 mg via ORAL
  Filled 2017-05-09: qty 1

## 2017-05-09 MED ORDER — OMEPRAZOLE 40 MG PO CPDR: 40.0000 mg | DELAYED_RELEASE_CAPSULE | Freq: Every day | ORAL | 0 refills | Status: DC

## 2017-05-09 MED ORDER — SUCRALFATE 1 GM/10ML PO SUSP: 1.0000 g | Freq: Three times a day (TID) | ORAL | 0 refills | Status: DC

## 2017-05-09 MED ORDER — DICYCLOMINE HCL 20 MG PO TABS: 20.0000 mg | ORAL_TABLET | Freq: Two times a day (BID) | ORAL | 0 refills | Status: DC

## 2017-05-09 NOTE — Discharge Instructions (Signed)

## 2017-05-09 NOTE — ED Triage Notes (Signed)
Pt c/o mid abd pain x 1 month intetrmittent. State rest makes it worse. Denies n/v/d. States abd hurts worse with trying to have bm. Pt states fell approx 2 months ago and c/o left knee pain and "giving out".

## 2017-05-09 NOTE — ED Provider Notes (Signed)
Emergency Department Provider Note   I have reviewed the triage vital signs and the nursing notes.   HISTORY  Chief Complaint Abdominal Pain   HPI KARO ROG is a 56 y.o. male with PMH of CAD, CKD, HLD, HTN, CVA, and peripheral vascular disease presents to the emergency department for evaluation of abdominal discomfort has occurred intermittently over the past 2 months. Patient states began while he was watching a commercial for [employer] on TV. He was not eating at the time. He describes the pain is mostly in the upper part of his abdomen with radiation to the middle. No back pain. Denies any dysuria, hesitancy, urgency. No fevers or chills. No change in symptoms with eating or fasting. Patient denies any chest pain or difficulty breathing. He has not tried any over-the-counter medications. He reports a past medical history of reflux disease but states that this feels different. No modifying factors.   Patient is also complaining of left knee pain. Symptoms ongoing for the last several weeks and are worse with standing. He describes a small amount of swelling. States is typically able to manipulate the knee joint and walk normally. No new injuries, joint redness, or fever.    Past Medical History:  Diagnosis Date  . CAD (coronary artery disease)    a. Cath 07/2012 following abnormal nuclear stress test: 20-30% LAD, 20-30% OM1, occluded branch off OM2 that fills slowly via collaterals, small nondominant RCA occluded proximally, fills slowly via collaterals.  . CKD (chronic kidney disease), stage II   . Depression   . Hyperlipidemia   . Hypertension   . Peripheral vascular disease ([place])    a. bilateral superficial femoral artery stents in May/August of 2012. b. Left femoral to above-knee popliteal bypass in 08/2012.  Marland Kitchen Renal insufficiency   . Stroke [facility])    2008 right side weakness  . Tobacco abuse     Patient Active Problem List   Diagnosis Date Noted  . PAD  (peripheral artery disease) ([place]) 08/09/2014  . CAD (coronary artery disease) 07/22/2014  . Tobacco abuse 07/22/2014  . 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 Hypertension 07/19/2012  . Hyperlipidemia 07/19/2012  . PVD (peripheral vascular disease) with claudication ([place]) 06/29/2012  . Atherosclerosis of native arteries of the extremities with intermittent claudication 04/20/2012    Past Surgical History:  Procedure Laterality Date  . ABDOMINAL AORTAGRAM N/A 04/27/2012   Procedure: ABDOMINAL Maxcine Ham;  Surgeon: Conrad Ventura, MD;  Location: [facility] CATH LAB;  Service: Cardiovascular;  Laterality: N/A;  . ABDOMINAL AORTAGRAM N/A 08/09/2014   Procedure: ABDOMINAL Maxcine Ham;  Surgeon: Elam Dutch, MD;  Location: [facility] CATH LAB;  Service: Cardiovascular;  Laterality: N/A;  . CARDIAC CATHETERIZATION    . COLONOSCOPY    . FEMORAL ARTERY - POPLITEAL ARTERY BYPASS GRAFT    . FEMORAL-POPLITEAL BYPASS GRAFT  09/26/2012   Procedure: BYPASS GRAFT FEMORAL-POPLITEAL ARTERY;  Surgeon: Elam Dutch, MD;  Location: [facility];  Service: Vascular;  Laterality: Left;  using 68mm Gore-Tex Propaten Ringed Graft, Vein patch on the proximal and distal anastomosis  . FEMORAL-POPLITEAL BYPASS GRAFT Right 08/12/2014   Procedure:  FEMORAL-POPLITEAL ARTERY BYPASS WITH SAPHENOUS VEIN GRAFT, INTRAOPERATIVE ARTERIOGRAM;  Surgeon: Elam Dutch, MD;  Location: [place];  Service: Vascular;  Laterality: Right;  . INTRAOPERATIVE ARTERIOGRAM  08/12/2014   Procedure: INTRA OPERATIVE ARTERIOGRAM;  Surgeon: Elam Dutch, MD;  Location: [place];  Service: Vascular;;  . PR VEIN BYPASS GRAFT,AORTO-FEM-POP    .  stents in bil legs      Current Outpatient Rx  . Order #: [id number] Class: Normal  . Order #: [id number] Class: Historical Med  . Order #: [id number] Class: Normal  . Order #: [id number] Class: Normal  . Order #: [id number] Class: Historical Med  . Order #: [id number] Class: Print  . Order #: [id number] Class: Print  .  Order #: [id number] Class: Print    Allergies Patient has no allergy information on record.  Family History  Problem Relation Age of Onset  . Diabetes Mother   . Heart disease Mother        before age 40- Triple BPG  . Cancer Mother        Luekemia  . Deep vein thrombosis Mother   . Hyperlipidemia Mother   . Hypertension Mother   . Heart attack Mother   . Peripheral vascular disease Mother   . Stroke Mother        x 3  . Arthritis Father     Social History Social History  Substance Use Topics  . Smoking status: Former Smoker    Packs/day: 1.00    Years: 25.00    Types: Cigarettes    Start date: 11/16/1974    Quit date: 02/25/2017  . Smokeless tobacco: Never Used  . Alcohol use No    Review of Systems  Constitutional: No fever/chills Eyes: No visual changes. ENT: No sore throat. Cardiovascular: Denies chest pain. Respiratory: Denies shortness of breath. Gastrointestinal: Positive epigastric abdominal pain.  No nausea, no vomiting.  No diarrhea.  No constipation. Genitourinary: Negative for dysuria. Musculoskeletal: Negative for back pain. Positive left knee pain.  Skin: Negative for rash. Neurological: Negative for headaches, focal weakness or numbness.  10-point ROS otherwise negative.  ____________________________________________   PHYSICAL EXAM:  VITAL SIGNS: ED Triage Vitals  Enc Vitals Group     BP 05/09/17 0857 135/85     Pulse Rate 05/09/17 0857 74     Resp 05/09/17 0857 18     Temp 05/09/17 0857 98.5 F (36.9 C)     Temp Source 05/09/17 0857 Oral     SpO2 05/09/17 0857 99 %     Weight 05/09/17 0854 198 lb (89.8 kg)     Height 05/09/17 0854 6\' 1"  (1.854 m)     Pain Score 05/09/17 0854 4   Constitutional: Alert and oriented. Well appearing and in no acute distress. Eyes: Conjunctivae are normal.  Head: Atraumatic. Nose: No congestion/rhinnorhea. Mouth/Throat: Mucous membranes are moist.  Neck: No stridor. Cardiovascular: Normal rate, regular  rhythm. Good peripheral circulation. Grossly normal heart sounds.   Respiratory: Normal respiratory effort.  No retractions. Lungs CTAB. Gastrointestinal: Soft and nontender. No distention.  Musculoskeletal: No lower extremity tenderness nor edema. No gross deformities of extremities. Mild left knee swelling without redness, bony tenderness, or abnormal ROM.  Neurologic:  Normal speech and language. No gross focal neurologic deficits are appreciated.  Skin:  Skin is warm, dry and intact. No rash noted.  ____________________________________________   LABS (all labs ordered are listed, but only abnormal results are displayed)  Labs Reviewed  CBC WITH DIFFERENTIAL/PLATELET - Abnormal; Notable for the following:       Result Value   HCT 38.5 (*)    All other components within normal limits  COMPREHENSIVE METABOLIC PANEL  LIPASE, BLOOD   ____________________________________________  RADIOLOGY  Dg Knee 2 Views Left  Result Date: 05/09/2017 CLINICAL DATA:  Diffuse left knee pain, fall, initial encounter. EXAM: LEFT KNEE - 1-2 VIEW COMPARISON:  None. FINDINGS:  Trace patellofemoral osteophytosis. Medial and patellofemoral compartment joint spaces are uniform. No joint effusion or fracture. Vascular stent and scattered surgical clips in the soft tissues. IMPRESSION: Trace patellofemoral osteophytosis. No additional findings to explain the patient's pain. Electronically Signed   By: Lorin Picket M.D.   On: 05/09/2017 09:48    ____________________________________________   PROCEDURES  Procedure(s) performed:   Procedures  None ____________________________________________   INITIAL IMPRESSION / ASSESSMENT AND PLAN / ED COURSE  Pertinent labs & imaging results that were available during my care of the patient were reviewed by me and considered in my medical decision making (see chart for details).  Patient presents to the emergency department for evaluation of abdominal pain has  occurred intermittently over the past 2 months. Apparently started while watching a commercial about crack[employer]. No nausea or vomiting. Abdomen is soft and nontender. No distention. Patient also complaining of left knee pain. No evidence of septic joint. No obvious injuries by report. Plan for plain, the knee along with labs for evaluation of abdominal discomfort. Very low suspicion for atypical ACS presentation. Treating with GI cocktail.   Patient feeling better on re-evaluation. Plan for discharge home with PCP follow up. Starting medication for GERD/Gastritis symptoms. Advise tylenol for left knee pain and no NSAIDs in the setting of gastritis symptoms.   At this time, I do not feel there is any life-threatening condition present. I have reviewed and discussed all results (EKG, imaging, lab, urine as appropriate), exam findings with patient. I have reviewed nursing notes and appropriate previous records.  I feel the patient is safe to be discharged home without further emergent workup. Discussed usual and customary return precautions. Patient and family (if present) verbalize understanding and are comfortable with this plan.  Patient will follow-up with their primary care provider. If they do not have a primary care provider, information for follow-up has been provided to them. All questions have been answered.  ____________________________________________  FINAL CLINICAL IMPRESSION(S) / ED DIAGNOSES  Final diagnoses:  Epigastric pain  Acute pain of left knee     MEDICATIONS GIVEN DURING THIS VISIT:  Medications  gi cocktail (Maalox,Lidocaine,Donnatal) (30 mLs Oral Given 05/09/17 0923)  dicyclomine (BENTYL) capsule 10 mg (10 mg Oral Given 05/09/17 0923)     NEW OUTPATIENT MEDICATIONS STARTED DURING THIS VISIT:  Discharge Medication List as of 05/09/2017 11:09 AM    START taking these medications   Details  dicyclomine (BENTYL) 20 MG tablet Take 1 tablet (20 mg total) by mouth 2 (two)  times daily., Starting Mon 05/09/2017, Print    omeprazole (PRILOSEC) 40 MG capsule Take 1 capsule (40 mg total) by mouth daily., Starting Mon 05/09/2017, Until Wed 06/08/2017, Print    sucralfate (CARAFATE) 1 GM/10ML suspension Take 10 mLs (1 g total) by mouth 4 (four) times daily -  with meals and at bedtime., Starting Mon 05/09/2017, Print         Note:  This document was prepared using Dragon voice recognition software and may include unintentional dictation errors.  Nanda Quinton, MD Emergency Medicine    Long, [facility], MD 05/09/17 [phone]

## 2017-06-03 ENCOUNTER — Other Ambulatory Visit: Payer: Self-pay | Admitting: Cardiology

## 2017-07-05 ENCOUNTER — Other Ambulatory Visit: Payer: Self-pay

## 2017-07-05 MED ORDER — LISINOPRIL 40 MG PO TABS: 40.0000 mg | ORAL_TABLET | Freq: Every day | ORAL | 3 refills | Status: DC

## 2017-07-21 ENCOUNTER — Ambulatory Visit: Payer: Medicare Other

## 2017-07-21 ENCOUNTER — Encounter: Payer: Medicare Other | Admitting: Podiatry

## 2017-07-21 DIAGNOSIS — M7752 Other enthesopathy of left foot: Secondary | ICD-10-CM

## 2017-08-02 ENCOUNTER — Encounter: Payer: Self-pay | Admitting: Podiatry

## 2017-08-02 ENCOUNTER — Ambulatory Visit (INDEPENDENT_AMBULATORY_CARE_PROVIDER_SITE_OTHER): Payer: Medicare Other | Admitting: Podiatry

## 2017-08-02 ENCOUNTER — Ambulatory Visit: Payer: Medicare Other

## 2017-08-02 DIAGNOSIS — M7752 Other enthesopathy of left foot: Secondary | ICD-10-CM

## 2017-08-02 DIAGNOSIS — M722 Plantar fascial fibromatosis: Secondary | ICD-10-CM | POA: Diagnosis not present

## 2017-08-02 NOTE — Progress Notes (Signed)
He presents today for follow-up of his left heel. He states that the last time he was here a year ago he was doing real well. He states now the heels hurting again.  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 Pulses are nonpalpable but is being revascularized in the next week or 2.  Assessment: Peripheral vascular disease left plantar fasciitis left.  Plan: Injected areas today with Kenalog and local anesthetic.

## 2017-08-09 DIAGNOSIS — Z23 Encounter for immunization: Secondary | ICD-10-CM | POA: Diagnosis not present

## 2017-08-10 ENCOUNTER — Other Ambulatory Visit: Payer: Self-pay

## 2017-08-10 ENCOUNTER — Emergency Department (HOSPITAL_COMMUNITY): Payer: Medicare Other

## 2017-08-10 ENCOUNTER — Encounter (HOSPITAL_COMMUNITY): Payer: Self-pay | Admitting: Emergency Medicine

## 2017-08-10 ENCOUNTER — Emergency Department (HOSPITAL_COMMUNITY)
Admission: EM | Admit: 2017-08-10 | Discharge: 2017-08-10 | Discharge status: Against Medical Advice | Payer: Medicare Other | Attending: Emergency Medicine | Admitting: Emergency Medicine

## 2017-08-10 DIAGNOSIS — F458 Other somatoform disorders: Secondary | ICD-10-CM | POA: Insufficient documentation

## 2017-08-10 DIAGNOSIS — I2510 Atherosclerotic heart disease of native coronary artery without angina pectoris: Secondary | ICD-10-CM | POA: Diagnosis not present

## 2017-08-10 DIAGNOSIS — R109 Unspecified abdominal pain: Secondary | ICD-10-CM | POA: Diagnosis not present

## 2017-08-10 DIAGNOSIS — R1013 Epigastric pain: Secondary | ICD-10-CM | POA: Diagnosis not present

## 2017-08-10 DIAGNOSIS — N182 Chronic kidney disease, stage 2 (mild): Secondary | ICD-10-CM | POA: Insufficient documentation

## 2017-08-10 DIAGNOSIS — Z79899 Other long term (current) drug therapy: Secondary | ICD-10-CM | POA: Diagnosis not present

## 2017-08-10 DIAGNOSIS — Z7982 Long term (current) use of aspirin: Secondary | ICD-10-CM | POA: Diagnosis not present

## 2017-08-10 DIAGNOSIS — Z87891 Personal history of nicotine dependence: Secondary | ICD-10-CM | POA: Insufficient documentation

## 2017-08-10 DIAGNOSIS — I129 Hypertensive chronic kidney disease with stage 1 through stage 4 chronic kidney disease, or unspecified chronic kidney disease: Secondary | ICD-10-CM | POA: Diagnosis not present

## 2017-08-10 DIAGNOSIS — M542 Cervicalgia: Secondary | ICD-10-CM | POA: Diagnosis not present

## 2017-08-10 DIAGNOSIS — R0989 Other specified symptoms and signs involving the circulatory and respiratory systems: Secondary | ICD-10-CM

## 2017-08-10 LAB — CBC WITH DIFFERENTIAL/PLATELET
BASOS ABS: 0 10*3/uL (ref 0.0–0.1)
BASOS PCT: 0 %
Eosinophils Absolute: 0.1 10*3/uL (ref 0.0–0.7)
Eosinophils Relative: 1 %
HEMATOCRIT: 39.6 % (ref 39.0–52.0)
HEMOGLOBIN: 13.4 g/dL (ref 13.0–17.0)
LYMPHS PCT: 24 %
Lymphs Abs: 2.5 10*3/uL (ref 0.7–4.0)
MCH: 30.5 pg (ref 26.0–34.0)
MCHC: 33.8 g/dL (ref 30.0–36.0)
MCV: 90.2 fL (ref 78.0–100.0)
MONO ABS: 0.5 10*3/uL (ref 0.1–1.0)
Monocytes Relative: 5 %
NEUTROS ABS: 7.2 10*3/uL (ref 1.7–7.7)
NEUTROS PCT: 70 %
Platelets: 255 10*3/uL (ref 150–400)
RBC: 4.39 MIL/uL (ref 4.22–5.81)
RDW: 13.8 % (ref 11.5–15.5)
WBC: 10.2 10*3/uL (ref 4.0–10.5)

## 2017-08-10 LAB — COMPREHENSIVE METABOLIC PANEL
ALT: 21 U/L (ref 17–63)
AST: 25 U/L (ref 15–41)
Albumin: 4.2 g/dL (ref 3.5–5.0)
Alkaline Phosphatase: 59 U/L (ref 38–126)
Anion gap: 9 (ref 5–15)
BUN: 12 mg/dL (ref 6–20)
CHLORIDE: 100 mmol/L — AB (ref 101–111)
CO2: 26 mmol/L (ref 22–32)
CREATININE: 1.06 mg/dL (ref 0.61–1.24)
Calcium: 9.4 mg/dL (ref 8.9–10.3)
Glucose, Bld: 91 mg/dL (ref 65–99)
POTASSIUM: 3.6 mmol/L (ref 3.5–5.1)
SODIUM: 135 mmol/L (ref 135–145)
Total Bilirubin: 0.6 mg/dL (ref 0.3–1.2)
Total Protein: 7.9 g/dL (ref 6.5–8.1)

## 2017-08-10 LAB — LIPASE, BLOOD: LIPASE: 33 U/L (ref 11–51)

## 2017-08-10 LAB — TROPONIN I

## 2017-08-10 LAB — RAPID STREP SCREEN (MED CTR MEBANE ONLY): STREPTOCOCCUS, GROUP A SCREEN (DIRECT): NEGATIVE

## 2017-08-10 MED ORDER — GADOBENATE DIMEGLUMINE 529 MG/ML IV SOLN
20.0000 mL | Freq: Once | INTRAVENOUS | Status: AC | PRN
  Administered 2017-08-10: 20 mL via INTRAVENOUS

## 2017-08-10 MED ORDER — FAMOTIDINE IN NACL 20-0.9 MG/50ML-% IV SOLN
20.0000 mg | Freq: Once | INTRAVENOUS | Status: AC
  Administered 2017-08-10: 20 mg via INTRAVENOUS
  Filled 2017-08-10: qty 50

## 2017-08-10 NOTE — ED Provider Notes (Signed)
[name] Provider Note   CSN: [id number] Arrival date & time: 08/10/17  8638     History   Chief Complaint Chief Complaint  Patient presents with  . Foreign Body    throat, 1+ month  . Abdominal Pain    several months    HPI [name] is a 56 y.o. male.   Foreign Body  Associated symptoms include abdominal pain.  Abdominal Pain      Pt was seen at [name]. Per pt, c/o gradual onset and persistence of constant acute flair of his chronic mid-epigastric abd "pain" for the past 3 months. Pt has been evaluated in the ED x2 for same "and they can't tell me what it is." Pt has not f/u with GI MD. Pt also c/o constant FB sensation in his throat for the past 1 month "after getting a fishbone stuck." Pt states he can "reach into my throat and feel it." Denies CP/SOB, no back pain, no N/V/D, no fevers, no hoarse voice, no drooling, no stridor/wheezing.   Past Medical History:  Diagnosis Date  . CAD (coronary artery disease)    a. Cath 07/2012 following abnormal nuclear stress test: 20-30% LAD, 20-30% OM1, occluded branch off OM2 that fills slowly via collaterals, small nondominant RCA occluded proximally, fills slowly via collaterals.  . CKD (chronic kidney disease), stage II   . Depression   . Hyperlipidemia   . Hypertension   . Peripheral vascular disease ([name])    a. bilateral superficial femoral artery stents in May/August of 2012. b. Left femoral to above-knee popliteal bypass in 08/2012.  Marland Kitchen Renal insufficiency   . Stroke [name])    2008 right side weakness  . Tobacco abuse     Patient Active Problem List   Diagnosis Date Noted  . PAD (peripheral artery disease) ([name]) 08/09/2014  . CAD (coronary artery disease) 07/22/2014  . Tobacco abuse 07/22/2014  . 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 Hypertension 07/19/2012  . Hyperlipidemia 07/19/2012  . PVD (peripheral vascular disease) with claudication ([name]) 06/29/2012  .  Atherosclerosis of native arteries of the extremities with intermittent claudication 04/20/2012    Past Surgical History:  Procedure Laterality Date  . CARDIAC CATHETERIZATION    . COLONOSCOPY    . FEMORAL ARTERY - POPLITEAL ARTERY BYPASS GRAFT    . PR VEIN BYPASS GRAFT,AORTO-FEM-POP    . stents in bil legs         Home Medications    Prior to Admission medications   Medication Sig Start Date End Date Taking? Authorizing Provider  amLODipine (NORVASC) 10 MG tablet TAKE ONE TABLET BY MOUTH ONCE DAILY 10/28/16   Arnoldo Lenis, MD  aspirin EC 81 MG tablet Take 81 mg by mouth daily.    [provider]  dicyclomine (BENTYL) 20 MG tablet Take 1 tablet (20 mg total) by mouth 2 (two) times daily. 05/09/17   Long, Wonda Olds, MD  lisinopril (PRINIVIL,ZESTRIL) 40 MG tablet Take 1 tablet (40 mg total) by mouth daily. 07/05/17   Arnoldo Lenis, MD  omeprazole (PRILOSEC) 40 MG capsule Take 1 capsule (40 mg total) by mouth daily. 05/09/17 06/08/17  Long, Wonda Olds, MD  rosuvastatin (CRESTOR) 10 MG tablet TAKE ONE TABLET BY MOUTH ONCE DAILY 06/06/17   Arnoldo Lenis, MD  sucralfate (CARAFATE) 1 GM/10ML suspension Take 10 mLs (1 g total) by mouth 4 (four) times daily -  with meals and at bedtime. 05/09/17   Long, Vonna Kotyk  G, MD  valACYclovir (VALTREX) 500 MG tablet Take 500 mg by mouth at bedtime.    [provider]    Family History Family History  Problem Relation Age of Onset  . Diabetes Mother   . Heart disease Mother        before age 2- Triple BPG  . Cancer Mother        Luekemia  . Deep vein thrombosis Mother   . Hyperlipidemia Mother   . Hypertension Mother   . Heart attack Mother   . Peripheral vascular disease Mother   . Stroke Mother        x 3  . Arthritis Father     Social History Social History   Tobacco Use  . Smoking status: Former Smoker    Packs/day: 1.00    Years: 25.00    Pack years: 25.00    Types: Cigarettes    Start date: 11/16/1974     Last attempt to quit: 02/25/2017    Years since quitting: 0.4  . Smokeless tobacco: Never Used  Substance Use Topics  . Alcohol use: No    Alcohol/week: 0.0 oz  . Drug use: Yes    Types: Marijuana    Comment: none for a year ( 2015)     Allergies   Patient has no known allergies.   Review of Systems Review of Systems  Gastrointestinal: Positive for abdominal pain.  ROS: Statement: All systems negative except as marked or noted in the HPI; Constitutional: Negative for fever and chills. ; ; Eyes: Negative for eye pain, redness and discharge. ; ; ENMT: Negative for ear pain, hoarseness, nasal congestion, sinus pressure and +FB sensation. ; ; Cardiovascular: Negative for chest pain, palpitations, diaphoresis, dyspnea and peripheral edema. ; ; Respiratory: Negative for cough, wheezing and stridor. ; ; Gastrointestinal: +abd pain. Negative for nausea, vomiting, diarrhea, blood in stool, hematemesis, jaundice and rectal bleeding. . ; ; Genitourinary: Negative for dysuria, flank pain and hematuria. ; ; Musculoskeletal: Negative for back pain and neck pain. Negative for swelling and trauma.; ; Skin: Negative for pruritus, rash, abrasions, blisters, bruising and skin lesion.; ; Neuro: Negative for headache, lightheadedness and neck stiffness. Negative for weakness, altered level of consciousness, altered mental status, extremity weakness, paresthesias, involuntary movement, seizure and syncope.       Physical Exam Updated Vital Signs BP (!) 134/95   Pulse 66   Temp 98.3 F (36.8 C) (Oral)   Resp 18   Ht 6\' 1"  (1.854 m)   Wt 90.3 kg (199 lb)   SpO2 100%   BMI 26.25 kg/m   Physical Exam 0850: Physical examination:  Nursing notes reviewed; Vital signs and O2 SAT reviewed;  Constitutional: Well developed, Well nourished, Well hydrated, In no acute distress; Head:  Normocephalic, atraumatic; Eyes: EOMI, PERRL, No scleral icterus; ENMT: TM's clear bilat. Mouth and pharynx normal, Mucous  membranes moist. Mouth and pharynx without lesions. No tonsillar exudates. No intra-oral edema. No submandibular or sublingual edema. No hoarse voice, no drooling, no stridor. No pain with manipulation of larynx. No trismus.; Neck: Supple, Full range of motion, No lymphadenopathy. No soft tissue crepitus.; Cardiovascular: Regular rate and rhythm, No gallop; Respiratory: Breath sounds clear & equal bilaterally, No wheezes.  Speaking full sentences with ease, Normal respiratory effort/excursion; Chest: Nontender, Movement normal; Abdomen: Soft, +mid-epigastric tenderness to palp. No rebound or guarding. Nondistended, Normal bowel sounds; Genitourinary: No CVA tenderness; Extremities: Pulses normal, No tenderness, No edema, No calf edema or asymmetry.; Neuro:  AA&Ox3, Major CN grossly intact.  Speech clear. No gross focal motor or sensory deficits in extremities.; Skin: Color normal, Warm, Dry.   ED Treatments / Results  Labs (all labs ordered are listed, but only abnormal results are displayed)   EKG  EKG Interpretation  Date/Time:  Wednesday August 10 2017 09:09:31 EST Ventricular Rate:  68 PR Interval:    QRS Duration: 89 QT Interval:  402 QTC Calculation: 428 R Axis:   51 Text Interpretation:  Sinus rhythm Borderline T abnormalities, inferior leads When compared with ECG of 07/26/2014 Nonspecific T wave abnormality is less pronounced QT has shortened Confirmed by Francine Graven [phone]) on 08/10/2017 9:28:30 AM       Radiology    Procedures Procedures (including critical care time)  Medications Ordered in ED Medications  famotidine (PEPCID) IVPB 20 mg premix (20 mg Intravenous New Bag/Given 08/10/17 0915)     Initial Impression / Assessment and Plan / ED Course  I have reviewed the triage vital signs and the nursing notes.  Pertinent labs & imaging results that were available during my care of the patient were reviewed by me and considered in my medical decision making (see  chart for details).  MDM Reviewed: previous chart, nursing note and vitals Reviewed previous: labs and ECG Interpretation: labs, ECG, x-ray, CT scan and MRI   Results for orders placed or performed during the hospital encounter of 08/10/17  Rapid strep screen  Result Value Ref Range   Streptococcus, Group A Screen (Direct) NEGATIVE NEGATIVE  Comprehensive metabolic panel  Result Value Ref Range   Sodium 135 135 - 145 mmol/L   Potassium 3.6 3.5 - 5.1 mmol/L   Chloride 100 (L) 101 - 111 mmol/L   CO2 26 22 - 32 mmol/L   Glucose, Bld 91 65 - 99 mg/dL   BUN 12 6 - 20 mg/dL   Creatinine, Ser 1.06 0.61 - 1.24 mg/dL   Calcium 9.4 8.9 - 10.3 mg/dL   Total Protein 7.9 6.5 - 8.1 g/dL   Albumin 4.2 3.5 - 5.0 g/dL   AST 25 15 - 41 U/L   ALT 21 17 - 63 U/L   Alkaline Phosphatase 59 38 - 126 U/L   Total Bilirubin 0.6 0.3 - 1.2 mg/dL   GFR calc non Af Amer >60 >60 mL/min   GFR calc Af Amer >60 >60 mL/min   Anion gap 9 5 - 15  Lipase, blood  Result Value Ref Range   Lipase 33 11 - 51 U/L  Troponin I  Result Value Ref Range   Troponin I <0.03 <0.03 ng/mL  CBC with Differential  Result Value Ref Range   WBC 10.2 4.0 - 10.5 K/uL   RBC 4.39 4.22 - 5.81 MIL/uL   Hemoglobin 13.4 13.0 - 17.0 g/dL   HCT 39.6 39.0 - 52.0 %   MCV 90.2 78.0 - 100.0 fL   MCH 30.5 26.0 - 34.0 pg   MCHC 33.8 30.0 - 36.0 g/dL   RDW 13.8 11.5 - 15.5 %   Platelets 255 150 - 400 K/uL   Neutrophils Relative % 70 %   Neutro Abs 7.2 1.7 - 7.7 K/uL   Lymphocytes Relative 24 %   Lymphs Abs 2.5 0.7 - 4.0 K/uL   Monocytes Relative 5 %   Monocytes Absolute 0.5 0.1 - 1.0 K/uL   Eosinophils Relative 1 %   Eosinophils Absolute 0.1 0.0 - 0.7 K/uL   Basophils Relative 0 %   Basophils Absolute 0.0 0.0 -  0.1 K/uL   Ct Abdomen Pelvis Wo Contrast Result Date: 08/10/2017 CLINICAL DATA:  Abdominal pain for month. EXAM: CT ABDOMEN AND PELVIS WITHOUT CONTRAST TECHNIQUE: Multidetector CT imaging of the abdomen and pelvis was  performed following the standard protocol without IV contrast. COMPARISON:  01/11/2011. FINDINGS: Lower chest: No acute abnormality. Hepatobiliary: No focal liver abnormality is seen. No gallstones, gallbladder wall thickening, or biliary dilatation. Pancreas: Unremarkable. No pancreatic ductal dilatation or surrounding inflammatory changes. Spleen: Normal in size without focal abnormality. Adrenals/Urinary Tract: Normal appearance of the adrenal glands. The right kidney is normal. Several left kidney cysts are identified which are incompletely characterized without IV contrast material. Bilateral vascular calcifications are noted within both kidneys. No kidney stones or hydronephrosis. Urinary bladder appears within normal limits. Stomach/Bowel: The stomach is normal. The small bowel loops have a normal caliber. No obstruction. The appendix is visualized and appears normal. Unremarkable appearance of the colon. Vascular/Lymphatic: Aortic atherosclerosis. Infrarenal abdominal aortic aneurysm measures 3.7 cm, image 45 of series 2. On the comparison exam the aorta measured 2.5 cm. Reproductive: Prostate is unremarkable. Other: No abdominal wall hernia or abnormality. No abdominopelvic ascites. Musculoskeletal: Anterolisthesis of L5 on S1 noted. There are bilateral L5 pars defects identified. IMPRESSION: 1. No acute findings within the abdomen or pelvis. 2. Aortic aneurysm NOS (ICD10-I71.9). Infrarenal abdominal aortic aneurysm has increased in size from comparison exam. Now 3.7 cm. Recommend followup by ultrasound in 2 years. This recommendation follows ACR consensus guidelines: White Paper of the ACR Incidental Findings Committee II on Vascular Findings. J Am Coll Radiol 2013; 68:032-122. 3.  Aortic Atherosclerosis (ICD10-I70.0). 4. 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 Electronically Signed   By: Kerby Moors M.D.   On: 08/10/2017 10:07   Dg Chest 2 View Result Date: 08/10/2017 CLINICAL  DATA:  Sensation of having a fish bone in the midchest dairy for the past month. Attempted to throw up last night and now pills at the fish bone is stuck in the throat and is palpable when he stick sees finger down the throat. Former smoker. EXAM: CHEST  2 VIEW COMPARISON:  PA and lateral chest x-ray September 15, 2012 FINDINGS: The lungs are adequately inflated. There is no focal infiltrate. The lung markings are coarse in the right infrahilar region but this is not new. There is no pleural effusion. The heart and pulmonary vascularity are normal. The mediastinum is normal in width. The bony thorax exhibits no acute abnormality. The trachea is midline. IMPRESSION: There is no acute cardiopulmonary abnormality. Electronically Signed   By: David  Martinique M.D.   On: 08/10/2017 09:44   Ct Soft Tissue Neck Wo Contrast Result Date: 08/10/2017 CLINICAL DATA:  56 year old male with pain after swallowing a fishbone. EXAM: CT NECK WITHOUT CONTRAST TECHNIQUE: Multidetector CT imaging of the neck was performed following the standard protocol without intravenous contrast. COMPARISON:  Report of [facility] noncontrast head CT 12/25/2015 (no images available). FINDINGS: Pharynx and larynx: Diffuse retropharyngeal edema or fluid measuring up to 10 mm in thickness maximal at the C3 level of the neck. However, the lateral parapharyngeal spaces appear normal. No gas in the retropharyngeal space. There may be mild generalized mucosal thickening throughout the pharynx, but there is no discrete pharyngeal mass. The retropharyngeal abnormality continues to the supraglottic larynx, but does not continue below the level of the upper cervical esophagus. Mild motion artifact at the larynx. Questionable mild supraglottic laryngeal edema similar to that in the pharynx. No laryngeal  mass identified. No radiopaque foreign body identified. Salivary glands: Sublingual space, submandibular glands and parotid glands are within normal  limits. Thyroid: Negative. Lymph nodes: No cervical lymphadenopathy. Bilateral lymph node stations appear within normal limits. Vascular: Vascular patency is not evaluated in the absence of IV contrast. Calcified atherosclerosis of the cervical carotids and ICA siphons. Limited intracranial: Negative. Visualized orbits: Minimally included on the right and negative. Mastoids and visualized paranasal sinuses: Partially visible mucosal thickening or mucous retention cysts in the left maxillary sinus. Otherwise clear. Skeleton: Mostly absent dentition. No CT abnormality of the cervical spine other than ordinary disc and endplate degeneration which appears concordant with age. No osseous erosion. Noncontrast spinal canal appears normal. No acute osseous abnormality identified. Upper chest: The cervical and visualized thoracic esophagus appear normal. Negative noncontrast superior mediastinum. Calcified aortic atherosclerosis. Mild centrilobular and paraseptal emphysema in the lung apices. Otherwise negative visible lung parenchyma. IMPRESSION: 1. Abnormal but nonspecific retropharyngeal fluid or edema throughout the level of the pharynx. Possible generalized pharyngeal and supraglottic larynx mucosal edema, but no radiopaque foreign body or fishbone is identified. Perhaps this reflects a retropharyngeal injury from the fishbone event. But there is no retropharyngeal or extraluminal gas to suggest a perforation. There is no CT evidence of cervical discitis or osteomyelitis, but a cervical spine MRI without and with contrast would be more sensitive. 2. No lymphadenopathy. 3. Negative visible cervical and upper thoracic esophagus, and visible superior mediastinum. 4. Calcified carotid atherosclerosis. Electronically Signed   By: Genevie Ann M.D.   On: 08/10/2017 10:15   Mr Cervical Spine W Or Wo Contrast Result Date: 08/10/2017 CLINICAL DATA:  Laryngeal edema. Patient states that a fish bone is stuck in his throat.  Discomfort. Symptoms for 1 month. Abnormal CT scan with retropharyngeal edema. EXAM: MRI CERVICAL SPINE WITHOUT AND WITH CONTRAST TECHNIQUE: Multiplanar and multiecho pulse sequences of the cervical spine, to include the craniocervical junction and cervicothoracic junction, were obtained without and with intravenous contrast. CONTRAST:  84mL MULTIHANCE GADOBENATE DIMEGLUMINE 529 MG/ML IV SOLN COMPARISON:  CT neck with contrast from the same day. FINDINGS: Alignment: AP alignment is anatomic. Vertebrae: Marrow signal and vertebral body heights are normal. There is no evidence for disc osteomyelitis. Cord: Normal signal is present in the cervical and upper thoracic spinal cord to the lowest imaged level, T1-2. Posterior Fossa, vertebral arteries, paraspinal tissues: Prevertebral edema is evident from the skullbase through the C4 level. There are no inflammatory changes or enhancement of the vertebral bodies. No foreign body or mass lesion is present. Disc levels: C2-3:  Negative. C3-4: Mild uncovertebral spurring is present bilaterally without significant stenosis. C4-5: A broad-based disc osteophyte complex is present. Uncovertebral spurring is noted bilaterally. Moderate left and mild right foraminal narrowing is present. Partial effacement of the ventral CSF. C5-6: Asymmetric left-sided uncovertebral disease results in moderate left foraminal stenosis. C6-7: A right paramedian soft disc protrusion partially effaces the ventral CSF. The foramina are patent. C7-T1:  Negative. IMPRESSION: 1. Prevertebral edema extending from the skull base through T4 without associated inflammatory changes of the vertebral bodies, the discs, or the paraspinous musculature. This most likely represents the sequelae of repeated trauma. Although there is no calcification evident on the CT, this could represent some form of calcific tendinitis, an inflammatory response. Infection is considered less likely. 2. Moderate left and mild right  foraminal narrowing at C4-5 due to uncovertebral disease. 3. Moderate left foraminal narrowing at C5-6. 4. Right paramedian soft disc protrusion without significant foraminal stenosis  at C6-7. Electronically Signed   By: San Morelle M.D.   On: 08/10/2017 12:21    1330:  MRI and CT as above. No clear infection, FB, perforation. While awaiting call back from ENT Dr. Erik Obey, pt demanded his IV be taken out and that he was going to leave.  ED RN and  I encouraged pt to stay to wait for specialist consultation regarding the results of his CT/MRI scans, but pt continues to refuse.  Pt makes his own medical decisions.  Risks of AMA explained to pt, including, but not limited to:  Infection, stroke, heart attack, cardiac arrythmia ("irregular heart rate/beat"), "passing out," temporary and/or permanent disability, death.  Pt verb understanding and continue to refuse to stay, understanding the consequences of his decision.  I encouraged pt to follow up with his PMD tomorrow and return to the ED immediately if symptoms worsen, return, or for any other concerns. Pt verb understanding, agreeable.   Final Clinical Impressions(s) / ED Diagnoses   Final diagnoses:  None    ED Discharge Orders    None        Francine Graven, DO 08/12/17 1411

## 2017-08-10 NOTE — ED Notes (Signed)
Patient transported to X-ray 

## 2017-08-10 NOTE — ED Triage Notes (Signed)
PT states he thinks a fish bone is struck in his throat and he reached down into his throat and can feel it. PT states some discomfort and the incident happened a month ago. PT denies any n/v/d.

## 2017-08-10 NOTE — ED Notes (Signed)
Pt states to RN, "I'm leaving, I need this IV out". Pt encouraged to stay to speak with EDP. Pt insistent about leaving. Pt's IV removed. Pt signed [place] paperwork.

## 2017-08-12 LAB — CULTURE, GROUP A STREP (THRC)

## 2017-08-16 DIAGNOSIS — R1013 Epigastric pain: Secondary | ICD-10-CM | POA: Diagnosis not present

## 2017-08-23 ENCOUNTER — Ambulatory Visit: Payer: Medicare Other | Admitting: Gastroenterology

## 2017-08-23 ENCOUNTER — Encounter: Payer: Self-pay | Admitting: Gastroenterology

## 2017-08-29 ENCOUNTER — Ambulatory Visit (INDEPENDENT_AMBULATORY_CARE_PROVIDER_SITE_OTHER): Payer: Medicare Other | Admitting: Family

## 2017-08-29 ENCOUNTER — Encounter: Payer: Self-pay | Admitting: Family

## 2017-08-29 VITALS — BP 126/89 | HR 82 | Temp 99.0°F | Resp 18 | Wt 199.2 lb

## 2017-08-29 DIAGNOSIS — F17200 Nicotine dependence, unspecified, uncomplicated: Secondary | ICD-10-CM

## 2017-08-29 DIAGNOSIS — T82898D Other specified complication of vascular prosthetic devices, implants and grafts, subsequent encounter: Secondary | ICD-10-CM | POA: Diagnosis not present

## 2017-08-29 DIAGNOSIS — I779 Disorder of arteries and arterioles, unspecified: Secondary | ICD-10-CM

## 2017-08-29 DIAGNOSIS — Z95828 Presence of other vascular implants and grafts: Secondary | ICD-10-CM

## 2017-08-29 NOTE — Patient Instructions (Signed)
Steps to Quit Smoking Smoking tobacco can be bad for your health. It can also affect almost every organ in your body. Smoking puts you and people around you at risk for many serious long-lasting (chronic) diseases. Quitting smoking is hard, but it is one of the best things that you can do for your health. It is never too late to quit. What are the benefits of quitting smoking? When you quit smoking, you lower your risk for getting serious diseases and conditions. They can include:  Lung cancer or lung disease.  Heart disease.  Stroke.  Heart attack.  Not being able to have children (infertility).  Weak bones (osteoporosis) and broken bones (fractures).  If you have coughing, wheezing, and shortness of breath, those symptoms may get better when you quit. You may also get sick less often. If you are pregnant, quitting smoking can help to lower your chances of having a baby of low birth weight. What can I do to help me quit smoking? Talk with your doctor about what can help you quit smoking. Some things you can do (strategies) include:  Quitting smoking totally, instead of slowly cutting back how much you smoke over a period of time.  Going to in-person counseling. You are more likely to quit if you go to many counseling sessions.  Using resources and support systems, such as: ? Online chats with a counselor. ? Phone quitlines. ? Printed self-help materials. ? Support groups or group counseling. ? Text messaging programs. ? Mobile phone apps or applications.  Taking medicines. Some of these medicines may have nicotine in them. If you are pregnant or breastfeeding, do not take any medicines to quit smoking unless your doctor says it is okay. Talk with your doctor about counseling or other things that can help you.  Talk with your doctor about using more than one strategy at the same time, such as taking medicines while you are also going to in-person counseling. This can help make  quitting easier. What things can I do to make it easier to quit? Quitting smoking might feel very hard at first, but there is a lot that you can do to make it easier. Take these steps:  Talk to your family and friends. Ask them to support and encourage you.  Call phone quitlines, reach out to support groups, or work with a counselor.  Ask people who smoke to not smoke around you.  Avoid places that make you want (trigger) to smoke, such as: ? Bars. ? Parties. ? Smoke-break areas at work.  Spend time with people who do not smoke.  Lower the stress in your life. Stress can make you want to smoke. Try these things to help your stress: ? Getting regular exercise. ? Deep-breathing exercises. ? Yoga. ? Meditating. ? Doing a body scan. To do this, close your eyes, focus on one area of your body at a time from head to toe, and notice which parts of your body are tense. Try to relax the muscles in those areas.  Download or buy apps on your mobile phone or tablet that can help you stick to your quit plan. There are many free apps, such as QuitGuide from the CDC (Centers for Disease Control and Prevention). You can find more support from smokefree.gov and other websites.  This information is not intended to replace advice given to you by your health care provider. Make sure you discuss any questions you have with your health care provider. Document Released: 07/10/2009 Document   Revised: 05/11/2016 Document Reviewed: 01/28/2015 Elsevier Interactive Patient Education  2018 Elsevier Inc.     Peripheral Vascular Disease Peripheral vascular disease (PVD) is a disease of the blood vessels that are not part of your heart and brain. A simple term for PVD is poor circulation. In most cases, PVD narrows the blood vessels that carry blood from your heart to the rest of your body. This can result in a decreased supply of blood to your arms, legs, and internal organs, like your stomach or kidneys.  However, it most often affects a person's lower legs and feet. There are two types of PVD.  Organic PVD. This is the more common type. It is caused by damage to the structure of blood vessels.  Functional PVD. This is caused by conditions that make blood vessels contract and tighten (spasm).  Without treatment, PVD tends to get worse over time. PVD can also lead to acute ischemic limb. This is when an arm or limb suddenly has trouble getting enough blood. This is a medical emergency. Follow these instructions at home:  Take medicines only as told by your doctor.  Do not use any tobacco products, including cigarettes, chewing tobacco, or electronic cigarettes. If you need help quitting, ask your doctor.  Lose weight if you are overweight, and maintain a healthy weight as told by your doctor.  Eat a diet that is low in fat and cholesterol. If you need help, ask your doctor.  Exercise regularly. Ask your doctor for some good activities for you.  Take good care of your feet. ? Wear comfortable shoes that fit well. ? Check your feet often for any cuts or sores. Contact a doctor if:  You have cramps in your legs while walking.  You have leg pain when you are at rest.  You have coldness in a leg or foot.  Your skin changes.  You are unable to get or have an erection (erectile dysfunction).  You have cuts or sores on your feet that are not healing. Get help right away if:  Your arm or leg turns cold and blue.  Your arms or legs become red, warm, swollen, painful, or numb.  You have chest pain or trouble breathing.  You suddenly have weakness in your face, arm, or leg.  You become very confused or you cannot speak.  You suddenly have a very bad headache.  You suddenly cannot see. This information is not intended to replace advice given to you by your health care provider. Make sure you discuss any questions you have with your health care provider. Document Released:  12/08/2009 Document Revised: 02/19/2016 Document Reviewed: 02/21/2014 Elsevier Interactive Patient Education  2017 Elsevier Inc.  

## 2017-08-29 NOTE — Progress Notes (Signed)
[facility] [place]   CC: Follow up peripheral artery occlusive disease  History of Present Illness Todd [name] is a 56 y.o. male who is status post right femoral to below-knee popliteal bypass with non-reversed greater saphenous vein on 08/12/2014 by Dr. Oneida Alar. He had peroneal and anterior tibial artery runoff. The patient states that his right foot is significantly improved.  He previously had a left femoral to above-knee popliteal bypass with PTFE and vein patch proximally and distally in 2013. He had single vessel anterior tibial runoff to the left foot.  He was scheduled for an angiogram with Dr. Donzetta Matters after seeing me on 08/23/2016.  He left the hospital before the procedure could be completed.   He is here today for follow up.    Pt was last evaluated on 08-26-16 by [employer] and Dr. Oneida Alar. At that time his ABI showed adequate perfusion to his left leg. Dr. Oneida Alar indicated at that point, before embarking on a redo femoropopliteal bypass for an asymptomatic leg, the best option would be watchful waiting. The patient was to follow-up in one year with repeat ABIs. Dr. Oneida Alar would only consider a redo bypass for limb threatening ischemia. He will also try to quit smoking. He was given a prescription for Chantix that day.  Pt is able to walk a mile ("to the store") w/o claudication sx's; after walking a mile his left foot hurts a great deal. He is unable to ride a bike as his calves hurt trying to do this.   Pt did not have ABI's performed since 08-26-16.   Pt Diabetic: No Pt smoker: Currentsmoker, resumed in April 2017, states he quit in April2018, but there is a strong smell of cigarettes or marijuan on his person, states he is around smokers (started at age 70 yrs, quit from 2009 to 2013). He took Chantix before and was able to quit using that. Pt admits to smoking marijuana "now and then".   Pt meds include: Statin :yes Betablocker: no ASA:  yes Other anticoagulants/antiplatelets: no     Past Medical History:  Diagnosis Date  . CAD (coronary artery disease)    a. Cath 07/2012 following abnormal nuclear stress test: 20-30% LAD, 20-30% OM1, occluded branch off OM2 that fills slowly via collaterals, small nondominant RCA occluded proximally, fills slowly via collaterals.  . CKD (chronic kidney disease), stage II   . Depression   . Hyperlipidemia   . Hypertension   . Peripheral vascular disease ([place])    a. bilateral superficial femoral artery stents in May/August of 2012. b. Left femoral to above-knee popliteal bypass in 08/2012.  Marland Kitchen Renal insufficiency   . Stroke [facility])    2008 right side weakness  . Tobacco abuse     Social History Social History   Tobacco Use  . Smoking status: Former Smoker    Packs/day: 1.00    Years: 25.00    Pack years: 25.00    Types: Cigarettes    Start date: 11/16/1974    Last attempt to quit: 02/25/2017    Years since quitting: 0.5  . Smokeless tobacco: Never Used  Substance Use Topics  . Alcohol use: No    Alcohol/week: 0.0 oz  . Drug use: Yes    Types: Marijuana    Comment: none for a year ( 2015)    Family History Family History  Problem Relation Age of Onset  . Diabetes Mother   . Heart disease Mother  before age 82- Triple BPG  . Cancer Mother        Luekemia  . Deep vein thrombosis Mother   . Hyperlipidemia Mother   . Hypertension Mother   . Heart attack Mother   . Peripheral vascular disease Mother   . Stroke Mother        x 3  . Arthritis Father     Past Surgical History:  Procedure Laterality Date  . ABDOMINAL AORTAGRAM N/A 04/27/2012   Procedure: ABDOMINAL Maxcine Ham;  Surgeon: Conrad [place], MD;  Location: [facility] CATH LAB;  Service: Cardiovascular;  Laterality: N/A;  . ABDOMINAL AORTAGRAM N/A 08/09/2014   Procedure: ABDOMINAL Maxcine Ham;  Surgeon: Elam Dutch, MD;  Location: [facility] CATH LAB;  Service: Cardiovascular;  Laterality: N/A;  . CARDIAC CATHETERIZATION     . COLONOSCOPY    . FEMORAL ARTERY - POPLITEAL ARTERY BYPASS GRAFT    . FEMORAL-POPLITEAL BYPASS GRAFT  09/26/2012   Procedure: BYPASS GRAFT FEMORAL-POPLITEAL ARTERY;  Surgeon: Elam Dutch, MD;  Location: [facility] OR;  Service: Vascular;  Laterality: Left;  using 52mm Gore-Tex Propaten Ringed Graft, Vein patch on the proximal and distal anastomosis  . FEMORAL-POPLITEAL BYPASS GRAFT Right 08/12/2014   Procedure:  FEMORAL-POPLITEAL ARTERY BYPASS WITH SAPHENOUS VEIN GRAFT, INTRAOPERATIVE ARTERIOGRAM;  Surgeon: Elam Dutch, MD;  Location: [place];  Service: Vascular;  Laterality: Right;  . INTRAOPERATIVE ARTERIOGRAM  08/12/2014   Procedure: INTRA OPERATIVE ARTERIOGRAM;  Surgeon: Elam Dutch, MD;  Location: [place];  Service: Vascular;;  . PR VEIN BYPASS GRAFT,AORTO-FEM-POP    . stents in bil legs      No Known Allergies  Current Outpatient Medications  Medication Sig Dispense Refill  . amLODipine (NORVASC) 10 MG tablet TAKE ONE TABLET BY MOUTH ONCE DAILY 90 tablet 3  . aspirin EC 81 MG tablet Take 81 mg by mouth daily.    . rosuvastatin (CRESTOR) 10 MG tablet TAKE ONE TABLET BY MOUTH ONCE DAILY 90 tablet 3  . valACYclovir (VALTREX) 500 MG tablet Take 500 mg by mouth at bedtime.    Marland Kitchen omeprazole (PRILOSEC) 40 MG capsule Take 1 capsule (40 mg total) by mouth daily. 30 capsule 0   No current facility-administered medications for this visit.     ROS: See HPI for pertinent positives and negatives.   Physical Examination  Vitals:   08/29/17 1115 08/29/17 1117  BP: 129/87 126/89  Pulse: 82   Resp: 18   SpO2: 99%   Weight: 199 lb 3.2 oz (90.4 kg)    Body mass index is 26.28 kg/m.  General: A&O x 3, WDWN male. Strong odor of cigarette or marijuana smoke about pt.  Gait: normal Eyes: PERRLA. Pulmonary: Respirations are non labored, CTAB with slightly decreased air movement, without wheezes, rales, or rhonchi. Cardiac: regular rhythm, no detected murmur.     Carotid  Bruits Right Left   negative Negative   Abdominal aortic pulse is palpable. Radial pulses: 2+ palpable and =   VASCULAR EXAM: Extremitieswithout ischemic changes. without Gangrene; without open wounds. All toes of both feet are pink and warm with brisk capillary refill.      LE Pulses Right Left   FEMORAL 2+ palpable 2+ palpable    POPLITEAL 2+ palpable  not palpable   POSTERIOR TIBIAL not palpable, unable to detect Doppler signal  not palpable, unable to detect Doppler signal    DORSALIS PEDIS  ANTERIOR TIBIAL 1+ palpable  Not palpable, + Doppler signal  PERONEAL + Doppler signal + Doppler signal    Abdomen: soft, NT, no palpable masses.  Skin: no rashes, no ulcers. Musculoskeletal: no muscle wasting or atrophy. Neurologic: A&O X 3; Appropriate Affect, MOTOR FUNCTION: moving all extremities equally, motor strength 5/5 throughout. Speech is fluent/normal. CN 2-12 intact.     ASSESSMENT: Todd [name] is a 55 y.o. male who is status post right femoral to below-knee popliteal bypass with non-reversed greater saphenous vein on 08/12/2014 by Dr. Oneida Alar. He had peroneal and anterior tibial artery runoff.  He previously had a left femoral to above-knee popliteal bypass with PTFE and vein patch proximally and distally in 2013. He had single vessel anterior tibial runoff to the left foot.   There is a 1+ palpable right DP pulse, left DP and peroneal arteries with + Doppler signals, no Doppler signals in bilateral PT.  He is able to walk a mile w/o claudication sx's; after walking a mile his left foot hurts a great deal; he denies any sx's in his right LE.   He was scheduled for an angiogram with Dr. Donzetta Matters after seeing me on 08/23/2016.  He left the  hospital before the procedure could be completed.  ABI's were supposed to be performed today but were not; no ABI results on file since November 2017.  Pt states he is ready to have the procedure that Dr. Oneida Alar had discussed with him, pt indicates his left foot bothers him after walking a mile. He will return on 09-05-17 for ABI's, see Dr. Oneida Alar on 09-08-17, but since Dr. Oneida Alar has no appointments available that day, but is in the office, pt will be scheduled with me, and speak with Dr. Oneida Alar.   Fortunately he does not have DM.  He states that he quit smoking cigarettes in August 2018, but has a heavy odor of cigarette or marijuana smoke on his person. He admits to smoking marijuana "now and then".   He has a 3.7 cm abdominal aortic aneurysm found to have increased in size to 3.7 cm on 08-10-17 CTA abd/pelvis compared to 01-11-11 when it was 2.5 cm.   DATA   None today  08-10-17 CTA abd/pelvis in ED for evaluation of abdominal pain: Infrarenal abdominal aortic aneurysm has increased in size from comparison exam. Now 3.7 cm. Recommend followup by ultrasound in 2 years. Aortic atherosclerosis.   08-18-16 bilateral LE arterial duplex demonstrates right femoropopliteal arterial bypass graft is widely patent without evidence of restenosis or hyperplasia. Left LE arterial duplex indicates no blood flow throughout the left fem-pop bypass graft.  Right ABI is 0.96 ( 1.0 on 12-11-14) with absent PT waveform and triphasic AT waveform, normal TBI at 0.91. Left ABI declined to 0.67 from 1.0, absent PT waveform, monophasic AT waveform. TBI is 0.55.   At his 12-05-14 visit with Dr. Oneida Alar, bilateral LE bypass grafts were patent with triphasic AT waveforms bilaterally.  At pt's 01-02-16 visit his left ABI: PT was 0.89 with monophasic waveforms, AT was 1.04 with triphasic waveforms, TBI was normal at 1.22.        PLAN:  Based on the patient's vascular studies and examination, pt will return to clinic  in next week for ABI's, see me on 09-08-17 since Dr. Oneida Alar schedule is full; pt must be seen in the office on a day that Dr. Oneida Alar is present to discuss possible surgery.   I discussed in depth with the patient the nature of atherosclerosis, and emphasized the importance of maximal medical management including  strict control of blood pressure, blood glucose, and lipid levels, obtaining regular exercise, and cessation of smoking.  The patient is aware that without maximal medical management the underlying atherosclerotic disease process will progress, limiting the benefit of any interventions.  The patient was given information about PAD including signs, symptoms, treatment, what symptoms should prompt the patient to seek immediate medical care, and risk reduction measures to take.  Clemon Chambers, RN, MSN, FNP-C [facility] Phone: [phone]  Clinic MD: Trula Slade  08/29/17 11:34 AM

## 2017-08-30 ENCOUNTER — Telehealth: Payer: Self-pay | Admitting: *Deleted

## 2017-08-30 ENCOUNTER — Other Ambulatory Visit: Payer: Self-pay | Admitting: *Deleted

## 2017-08-30 ENCOUNTER — Ambulatory Visit (INDEPENDENT_AMBULATORY_CARE_PROVIDER_SITE_OTHER): Payer: Medicare Other | Admitting: Nurse Practitioner

## 2017-08-30 ENCOUNTER — Encounter: Payer: Self-pay | Admitting: *Deleted

## 2017-08-30 ENCOUNTER — Encounter: Payer: Self-pay | Admitting: Nurse Practitioner

## 2017-08-30 DIAGNOSIS — K219 Gastro-esophageal reflux disease without esophagitis: Secondary | ICD-10-CM

## 2017-08-30 DIAGNOSIS — K5900 Constipation, unspecified: Secondary | ICD-10-CM

## 2017-08-30 DIAGNOSIS — F458 Other somatoform disorders: Secondary | ICD-10-CM | POA: Diagnosis not present

## 2017-08-30 DIAGNOSIS — Z1211 Encounter for screening for malignant neoplasm of colon: Secondary | ICD-10-CM

## 2017-08-30 DIAGNOSIS — R0989 Other specified symptoms and signs involving the circulatory and respiratory systems: Secondary | ICD-10-CM

## 2017-08-30 MED ORDER — PANTOPRAZOLE SODIUM 40 MG PO TBEC: 40.0000 mg | DELAYED_RELEASE_TABLET | Freq: Two times a day (BID) | ORAL | 3 refills | Status: DC

## 2017-08-30 MED ORDER — PEG 3350-KCL-NA BICARB-NACL 420 G PO SOLR: 4000.0000 mL | Freq: Once | ORAL | 0 refills | Status: AC

## 2017-08-30 NOTE — Telephone Encounter (Signed)
LMOVM advising preop is scheduled for 10/03/17 at 9:00am. Letter also mailed.

## 2017-08-30 NOTE — Assessment & Plan Note (Signed)
Noted GERD symptoms which appear to be worse at night.  Also with esophageal burning, epigastric pain.  He was tried on Prilosec which she does not feel helped although he did not call for any refills after finishing the initial prescription.  At this point I will start him on Protonix 40 mg twice a day.  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  Return for follow-up in 3 months.

## 2017-08-30 NOTE — Assessment & Plan Note (Signed)
The patient has a globus sensation for the past month.  At that time he was eating fish and felt fish bones get stuck in his throat, he tried to cough them up and saw a little bit of blood so he stopped.  He was seen in the emergency department and a CT of the neck was performed which did not find radiopaque object.  However there was noted to be some edema to suggest possible retropharyngeal injury, although no intra-tissue gas suggesting perforation was found.  At this point we will proceed with an upper endoscopy to further evaluate his symptoms.  It is possible he has reflux which has been silent for a while until he suffered his postpharyngeal/esophageal injury with lodged food item and now GERD symptoms and reflux are worsening this irritation.  I started him on Prilosec 40 mg twice daily.  Of note it has been about 12 years since his last colonoscopy and he is currently due.  Previous colonoscopy completed in [place], [place].  We will request these records.  Proceed with EGD and TCS on propofol/MAC with Dr. Gala Romney in near future: the risks, benefits, and alternatives have been discussed with the patient in detail. The patient states understanding and desires to proceed.  The patient is not on any anticoagulants, anxiolytics, chronic pain medications, or antidepressants.  He does seem to be somewhat anxious.  He has intermittent recreational drug use.  Given this we will plan for the procedure on propofol/MAC to promote adequate sedation.

## 2017-08-30 NOTE — Progress Notes (Signed)
cc'ed to pcp °

## 2017-08-30 NOTE — Patient Instructions (Signed)
1. I have sent a prescription to your pharmacy for Protonix 40 mg.  Take this twice a day, on an empty stomach (about 30 minutes before eating). 2. Start taking a fiber supplement such as Benefiber, gummy fibers, etc. 1-2 times a day.  You can talk with the pharmacist at the pharmacy if you need help selecting an appropriate option. 3. We will schedule your procedures for you. 4. We will request your last colonoscopy report from [place], [place] so we can have it in our records. 5. Return for follow-up in 3 months. 6. Call us if you have any questions or concerns.

## 2017-08-30 NOTE — Progress Notes (Signed)
Primary Care Physician:  Lavella Lemons, PA Primary Gastroenterologist:  Dr. Gala Romney  Chief Complaint  Patient presents with  . chest discomfort    "hurts in esophagus", pain worse at night    HPI:   Todd [name] is a 56 y.o. male who presents on referral from PCP for esophageal pain. No history of colonoscopy or endoscopy in our system. He was recently seen in the ER for globus sensation CT abdomen, soft tissues of the neck, and cervical spine MRI were ordered and completed.  CT abdomen found no acute abdomen/pelvis findings. Enlarging aortic aneurism found and recommended followup U/S in 2 years. CT soft tissues of the neck found non-specific retropharyngeal fluid or edema but no gas/perforation as possible injury from "fishbone" although no radiopaque object was noted. CT cervical spine found some cervical spine prevertebral edema, foramenal narrowing.  Today he states he's doing ok overall. He was in [facility] about a month ago and eating fish at which point a fishbone got stuck in his throat, he was trying to cough it up and he saw blood so he stopped. He went to the ER. He has frequent saliva production and spitting. Denies abdominal pain, GERD symptoms. He is having "a lot of vomiting" about "every other day or every other week." Has esophageal burning, worse at night, wakes him at night. Takes Tylenol which helps some. Denies hematochezia, melena, fever, chills, unintentional weight loss. Also has constipation with lower abdomen pain when he needs to defecate. BM as often as once a day but with straining and hard stools. No sensation of incomplete emptying. Drinks a lot of water, eats minimal fiber. Denies chest pain, dyspnea, dizziness, lightheadedness, syncope, near syncope. Denies any other upper or lower GI symptoms.  He states his last colonoscopy was in [place], [place] about 2006.  He does not remember results.  Past Medical History:  Diagnosis Date  . CAD (coronary artery  disease)    a. Cath 07/2012 following abnormal nuclear stress test: 20-30% LAD, 20-30% OM1, occluded branch off OM2 that fills slowly via collaterals, small nondominant RCA occluded proximally, fills slowly via collaterals.  . CKD (chronic kidney disease), stage II   . Depression   . Hyperlipidemia   . Hypertension   . Peripheral vascular disease ([place])    a. bilateral superficial femoral artery stents in May/August of 2012. b. Left femoral to above-knee popliteal bypass in 08/2012.  Marland Kitchen Renal insufficiency   . Stroke [facility])    2008 right side weakness  . Tobacco abuse     Past Surgical History:  Procedure Laterality Date  . ABDOMINAL AORTAGRAM N/A 04/27/2012   Procedure: ABDOMINAL Maxcine Ham;  Surgeon: Conrad Molalla, MD;  Location: [facility] CATH LAB;  Service: Cardiovascular;  Laterality: N/A;  . ABDOMINAL AORTAGRAM N/A 08/09/2014   Procedure: ABDOMINAL Maxcine Ham;  Surgeon: Elam Dutch, MD;  Location: [facility] CATH LAB;  Service: Cardiovascular;  Laterality: N/A;  . CARDIAC CATHETERIZATION    . COLONOSCOPY    . FEMORAL ARTERY - POPLITEAL ARTERY BYPASS GRAFT    . FEMORAL-POPLITEAL BYPASS GRAFT  09/26/2012   Procedure: BYPASS GRAFT FEMORAL-POPLITEAL ARTERY;  Surgeon: Elam Dutch, MD;  Location: [facility] OR;  Service: Vascular;  Laterality: Left;  using 83mm Gore-Tex Propaten Ringed Graft, Vein patch on the proximal and distal anastomosis  . FEMORAL-POPLITEAL BYPASS GRAFT Right 08/12/2014   Procedure:  FEMORAL-POPLITEAL ARTERY BYPASS WITH SAPHENOUS VEIN GRAFT, INTRAOPERATIVE ARTERIOGRAM;  Surgeon: Elam Dutch, MD;  Location: [place];  Service: Vascular;  Laterality: Right;  . INTRAOPERATIVE ARTERIOGRAM  08/12/2014   Procedure: INTRA OPERATIVE ARTERIOGRAM;  Surgeon: Elam Dutch, MD;  Location: [place];  Service: Vascular;;  . PR VEIN BYPASS GRAFT,AORTO-FEM-POP    . stents in bil legs      Current Outpatient Medications  Medication Sig Dispense Refill  . amLODipine (NORVASC) 10 MG tablet TAKE ONE  TABLET BY MOUTH ONCE DAILY 90 tablet 3  . aspirin EC 81 MG tablet Take 81 mg by mouth daily.    . rosuvastatin (CRESTOR) 10 MG tablet TAKE ONE TABLET BY MOUTH ONCE DAILY 90 tablet 3  . valACYclovir (VALTREX) 500 MG tablet Take 500 mg by mouth at bedtime.     No current facility-administered medications for this visit.     Allergies as of 08/30/2017  . (No Known Allergies)    Family History  Problem Relation Age of Onset  . Diabetes Mother   . Heart disease Mother        before age 90- Triple BPG  . Cancer Mother        Luekemia  . Deep vein thrombosis Mother   . Hyperlipidemia Mother   . Hypertension Mother   . Heart attack Mother   . Peripheral vascular disease Mother   . Stroke Mother        x 3  . Pancreatic cancer Mother   . Arthritis Father   . Colon cancer Neg Hx   . Gastric cancer Neg Hx   . Esophageal cancer Neg Hx     Social History   Socioeconomic History  . Marital status: Legally Separated    Spouse name: Not on file  . Number of children: Not on file  . Years of education: Not on file  . Highest education level: Not on file  Social Needs  . Financial resource strain: Not on file  . Food insecurity - worry: Not on file  . Food insecurity - inability: Not on file  . Transportation needs - medical: Not on file  . Transportation needs - non-medical: Not on file  Occupational History  . Not on file  Tobacco Use  . Smoking status: Former Smoker    Packs/day: 1.00    Years: 25.00    Pack years: 25.00    Types: Cigarettes    Start date: 11/16/1974    Last attempt to quit: 02/25/2017    Years since quitting: 0.5  . Smokeless tobacco: Never Used  Substance and Sexual Activity  . Alcohol use: No    Alcohol/week: 0.0 oz  . Drug use: Yes    Types: Marijuana    Comment: occ  . Sexual activity: Not Currently    Partners: Female  Other Topics Concern  . Not on file  Social History Narrative  . Not on file    Review of Systems: General: Negative for  anorexia, weight loss, fever, chills, fatigue, weakness. ENT: Negative for hoarseness, difficulty swallowing , nasal congestion. CV: Negative for chest pain, angina, palpitations, dyspnea on exertion, peripheral edema.  Respiratory: Negative for dyspnea at rest, dyspnea on exertion, cough, sputum, wheezing.  GI: See history of present illness. MS: Negative for joint pain, low back pain. Lower extremity stenting for PAD. Derm: Negative for rash or itching.  Endo: Negative for unusual weight change.  Heme: Negative for bruising or bleeding. Allergy: Negative for rash or hives.    Physical Exam: BP 127/87   Pulse 89   Temp 97.7 F (36.5 C) (Oral)  Ht 6\' 1"  (1.854 m)   Wt 198 lb 9.6 oz (90.1 kg)   BMI 26.20 kg/m  General:   Alert and oriented. Pleasant and cooperative. Well-nourished and well-developed.  Head:  Normocephalic and atraumatic. Eyes:  Without icterus, sclera clear and conjunctiva pink.  Ears:  Normal auditory acuity. Cardiovascular:  S1, S2 present without murmurs appreciated. Extremities without clubbing or edema. Respiratory:  Clear to auscultation bilaterally. No wheezes, rales, or rhonchi. No distress.  Gastrointestinal:  +BS, soft, and non-distended. Mild to moderate epigastric pain noted. No HSM noted. No guarding or rebound. No masses appreciated.  Rectal:  Deferred  Musculoskalatal:  Symmetrical without gross deformities. Neurologic:  Alert and oriented x4;  grossly normal neurologically. Psych:  Alert and cooperative. Normal mood and affect. Heme/Lymph/Immune: No excessive bruising noted.    08/30/2017 8:46 AM   Disclaimer: This note was dictated with voice recognition software. Similar sounding words can inadvertently be transcribed and may not be corrected upon review.

## 2017-08-30 NOTE — Addendum Note (Signed)
Addended by: Lianne Cure A on: 08/30/2017 10:23 AM   Modules accepted: Orders

## 2017-08-30 NOTE — Assessment & Plan Note (Signed)
Patient has daily bowel movement and feels completely emptied after going.  However, his stools are hard and he require straining.  No red flag/warning signs or symptoms such as hematochezia, melena, weight loss.  He drinks plenty of water, per his opinion.  However, he admits that he eats minimal to no fiber.  At this time we will have him start a fiber supplement 1-2 times a day to help promote softer stools and better bowel movement.  Colonoscopy as per above due to necessary timeframe for colorectal cancer screening.  Return for follow-up in 3 months.

## 2017-08-31 ENCOUNTER — Telehealth: Payer: Self-pay | Admitting: Internal Medicine

## 2017-08-31 NOTE — Telephone Encounter (Signed)
Pt signed a release of records while he was here yesterday. I am to get his last colonoscopy report. I LMOM for patient to call me back. I need to know who did the procedure or where he had it done. [facility] or Dr Serita Grit office have no records of him

## 2017-09-01 ENCOUNTER — Telehealth: Payer: Self-pay | Admitting: Internal Medicine

## 2017-09-01 NOTE — Telephone Encounter (Signed)
Pt said he was returning a call but doesn't know who it was. He said he was suppose to be getting some pills? Has anyone called him about a prescription? Please call him at [phone]

## 2017-09-01 NOTE — Telephone Encounter (Signed)
Spoke with and pt needed clarity on medication needed for his prep. Pt was notified that the medication needed was otc along with the prep sent into the pts pharmacy. Pt also received a prescription for Prilosec.

## 2017-09-05 ENCOUNTER — Encounter (HOSPITAL_COMMUNITY): Payer: Medicare Other

## 2017-09-08 ENCOUNTER — Ambulatory Visit: Payer: Medicare Other | Admitting: Family

## 2017-09-14 ENCOUNTER — Ambulatory Visit: Payer: Medicare Other | Admitting: Family

## 2017-09-22 ENCOUNTER — Encounter: Payer: Self-pay | Admitting: Vascular Surgery

## 2017-09-22 ENCOUNTER — Ambulatory Visit (HOSPITAL_COMMUNITY)
Admission: RE | Admit: 2017-09-22 | Discharge: 2017-09-22 | Disposition: A | Discharge status: Home / Self Care | Payer: Medicare Other | Source: Ambulatory Visit | Attending: Vascular Surgery | Admitting: Vascular Surgery

## 2017-09-22 ENCOUNTER — Ambulatory Visit (INDEPENDENT_AMBULATORY_CARE_PROVIDER_SITE_OTHER): Payer: Medicare Other | Admitting: Vascular Surgery

## 2017-09-22 VITALS — BP 148/87 | HR 69 | Temp 98.5°F | Resp 18 | Ht 73.0 in | Wt 202.7 lb

## 2017-09-22 DIAGNOSIS — I739 Peripheral vascular disease, unspecified: Secondary | ICD-10-CM | POA: Diagnosis not present

## 2017-09-22 DIAGNOSIS — I70201 Unspecified atherosclerosis of native arteries of extremities, right leg: Secondary | ICD-10-CM | POA: Insufficient documentation

## 2017-09-22 DIAGNOSIS — Z95828 Presence of other vascular implants and grafts: Secondary | ICD-10-CM | POA: Diagnosis not present

## 2017-09-22 DIAGNOSIS — F17200 Nicotine dependence, unspecified, uncomplicated: Secondary | ICD-10-CM

## 2017-09-22 DIAGNOSIS — I779 Disorder of arteries and arterioles, unspecified: Secondary | ICD-10-CM | POA: Diagnosis not present

## 2017-09-22 DIAGNOSIS — T82898D Other specified complication of vascular prosthetic devices, implants and grafts, subsequent encounter: Secondary | ICD-10-CM | POA: Insufficient documentation

## 2017-09-22 NOTE — Progress Notes (Signed)
This encounter was created in error - please disregard.

## 2017-09-22 NOTE — Progress Notes (Signed)
Patient is a 56 year old male who returns for follow-up today.  He has previously undergone a right femoral below-knee popliteal bypass with vein in 2015.  He underwent left femoral to above-knee popliteal bypass December 2013.  A left leg bypass has been occluded since 2016.  He states that currently he experiences claudication symptoms in his left leg after walking 3-4 miles.  He has quit smoking.  He stopped smoking 5 months ago.  He denies rest pain.  He has no history of nonhealing wounds.  He is on aspirin and Crestor.  Other chronic medical problems include coronary artery disease, chronic kidney disease stage II, hyperlipidemia, hypertension all of which have been stable.  He also has a known 3.7 cm abdominal aortic aneurysm but denies abdominal or back pain.  Review of systems: He denies shortness of breath.  He denies chest pain.  Past Medical History:  Diagnosis Date  . CAD (coronary artery disease)    a. Cath 07/2012 following abnormal nuclear stress test: 20-30% LAD, 20-30% OM1, occluded branch off OM2 that fills slowly via collaterals, small nondominant RCA occluded proximally, fills slowly via collaterals.  . CKD (chronic kidney disease), stage II   . Depression   . Hyperlipidemia   . Hypertension   . Peripheral vascular disease ([place])    a. bilateral superficial femoral artery stents in May/August of 2012. b. Left femoral to above-knee popliteal bypass in 08/2012.  Marland Kitchen Renal insufficiency   . Stroke [facility])    2008 right side weakness  . Tobacco abuse    Past Surgical History:  Procedure Laterality Date  . ABDOMINAL AORTAGRAM N/A 04/27/2012   Procedure: ABDOMINAL Maxcine Ham;  Surgeon: Conrad [place], MD;  Location: [facility] CATH LAB;  Service: Cardiovascular;  Laterality: N/A;  . ABDOMINAL AORTAGRAM N/A 08/09/2014   Procedure: ABDOMINAL Maxcine Ham;  Surgeon: Elam Dutch, MD;  Location: [facility] CATH LAB;  Service: Cardiovascular;  Laterality: N/A;  . CARDIAC CATHETERIZATION    . COLONOSCOPY     . FEMORAL ARTERY - POPLITEAL ARTERY BYPASS GRAFT    . FEMORAL-POPLITEAL BYPASS GRAFT  09/26/2012   Procedure: BYPASS GRAFT FEMORAL-POPLITEAL ARTERY;  Surgeon: Elam Dutch, MD;  Location: [facility] OR;  Service: Vascular;  Laterality: Left;  using 87mm Gore-Tex Propaten Ringed Graft, Vein patch on the proximal and distal anastomosis  . FEMORAL-POPLITEAL BYPASS GRAFT Right 08/12/2014   Procedure:  FEMORAL-POPLITEAL ARTERY BYPASS WITH SAPHENOUS VEIN GRAFT, INTRAOPERATIVE ARTERIOGRAM;  Surgeon: Elam Dutch, MD;  Location: [place];  Service: Vascular;  Laterality: Right;  . INTRAOPERATIVE ARTERIOGRAM  08/12/2014   Procedure: INTRA OPERATIVE ARTERIOGRAM;  Surgeon: Elam Dutch, MD;  Location: [place];  Service: Vascular;;  . PR VEIN BYPASS GRAFT,AORTO-FEM-POP    . stents in bil legs     Current Outpatient Medications on File Prior to Visit  Medication Sig Dispense Refill  . amLODipine (NORVASC) 10 MG tablet TAKE ONE TABLET BY MOUTH ONCE DAILY 90 tablet 3  . aspirin EC 81 MG tablet Take 81 mg by mouth daily.    . pantoprazole (PROTONIX) 40 MG tablet Take 1 tablet (40 mg total) by mouth 2 (two) times daily before a meal. 60 tablet 3  . rosuvastatin (CRESTOR) 10 MG tablet TAKE ONE TABLET BY MOUTH ONCE DAILY 90 tablet 3  . valACYclovir (VALTREX) 500 MG tablet Take 500 mg by mouth at bedtime.     No current facility-administered medications on file prior to visit.    Physical exam:  Vitals:   09/22/17  0917  BP: (!) 148/87  Pulse: 69  Resp: 18  Temp: 98.5 F (36.9 C)  TempSrc: Oral  SpO2: 99%  Weight: 202 lb 11.2 oz (91.9 kg)  Height: 6\' 1"  (1.854 m)    Extremities: Right leg 1+ dorsalis pedis pulse, left leg absent pedal pulses, 2+ femoral pulses bilaterally  Neck: No carotid bruits  Chest: Clear to auscultation bilaterally  Cardiac: Regular rate and rhythm without murmur  Abdomen: Soft nontender no pulsatile mass  Data: Patient had bilateral ABIs performed today which were 0.65  on the left 0.92 on the right  Assessment: Patient with bilateral peripheral arterial disease patent bypass in the right leg with a palpable pulse.  He has some left leg symptoms but does not experience these until walking 3-4 miles.  I do not believe that he needs a redo bypass at this point as he has adequate perfusion to heal wounds in his left foot and really he has no claudication symptoms at this point.  Plan: The patient will follow up in 1 year with bilateral ABIs and a graft duplex of the right leg.  We will also repeat his ultrasound of his abdominal aorta at that point.  If his aneurysm reaches 5-1/2 cm in diameter we would consider repair.  I would not consider doing a redo bypass in his left leg unless he has limb threatening ischemia or he has claudication at less than a block.  Ruta Hinds, MD [facility] Office: [phone] Pager: [phone]

## 2017-09-28 NOTE — Patient Instructions (Signed)
Todd [name]  09/28/2017     @PREFPERIOPPHARMACY @   Your procedure is scheduled on 10/06/2017.  Report to [facility] at 11:45 A.M.  Call this number if you have problems the morning of surgery:  [phone]   Remember:  Do not eat food or drink liquids after midnight.  Take these medicines the morning of surgery with A SIP OF WATER Norvasc, Lisinopril, Protonix   Do not wear jewelry, make-up or nail polish.  Do not wear lotions, powders, or perfumes, or deodorant.  Do not shave 48 hours prior to surgery.  Men may shave face and neck.  Do not bring valuables to the hospital.  [facility] is not responsible for any belongings or valuables.  Contacts, dentures or bridgework may not be worn into surgery.  Leave your suitcase in the car.  After surgery it may be brought to your room.  For patients admitted to the hospital, discharge time will be determined by your treatment team.  Patients discharged the day of surgery will not be allowed to drive home.    Please read over the following fact sheets that you were given. Anesthesia Post-op Instructions     PATIENT INSTRUCTIONS POST-ANESTHESIA  IMMEDIATELY FOLLOWING SURGERY:  Do not drive or operate machinery for the first twenty four hours after surgery.  Do not make any important decisions for twenty four hours after surgery or while taking narcotic pain medications or sedatives.  If you develop intractable nausea and vomiting or a severe headache please notify your doctor immediately.  FOLLOW-UP:  Please make an appointment with your surgeon as instructed. You do not need to follow up with anesthesia unless specifically instructed to do so.  WOUND CARE INSTRUCTIONS (if applicable):  Keep a dry clean dressing on the anesthesia/puncture wound site if there is drainage.  Once the wound has quit draining you may leave it open to air.  Generally you should leave the bandage intact for twenty four hours unless there is drainage.  If  the epidural site drains for more than 36-48 hours please call the anesthesia department.  QUESTIONS?:  Please feel free to call your physician or the hospital operator if you have any questions, and they will be happy to assist you.      Colonoscopy, Adult A colonoscopy is an exam to look at the entire large intestine. During the exam, a lubricated, bendable tube is inserted into the anus and then passed into the rectum, colon, and other parts of the large intestine. A colonoscopy is often done as a part of normal colorectal screening or in response to certain symptoms, such as anemia, persistent diarrhea, abdominal pain, and blood in the stool. The exam can help screen for and diagnose medical problems, including:  Tumors.  Polyps.  Inflammation.  Areas of bleeding.  Tell a health care provider about:  Any allergies you have.  All medicines you are taking, including vitamins, herbs, eye drops, creams, and over-the-counter medicines.  Any problems you or family members have had with anesthetic medicines.  Any blood disorders you have.  Any surgeries you have had.  Any medical conditions you have.  Any problems you have had passing stool. What are the risks? Generally, this is a safe procedure. However, problems may occur, including:  Bleeding.  A tear in the intestine.  A reaction to medicines given during the exam.  Infection (rare).  What happens before the procedure? Eating and drinking restrictions Follow instructions from your health care provider  about eating and drinking, which may include:  A few days before the procedure - follow a low-fiber diet. Avoid nuts, seeds, dried fruit, raw fruits, and vegetables.  1-3 days before the procedure - follow a clear liquid diet. Drink only clear liquids, such as clear broth or bouillon, black coffee or tea, clear juice, clear soft drinks or sports drinks, gelatin dessert, and popsicles. Avoid any liquids that contain red  or purple dye.  On the day of the procedure - do not eat or drink anything during the 2 hours before the procedure, or within the time period that your health care provider recommends.  Bowel prep If you were prescribed an oral bowel prep to clean out your colon:  Take it as told by your health care provider. Starting the day before your procedure, you will need to drink a large amount of medicated liquid. The liquid will cause you to have multiple loose stools until your stool is almost clear or light green.  If your skin or anus gets irritated from diarrhea, you may use these to relieve the irritation: ? Medicated wipes, such as adult wet wipes with aloe and vitamin E. ? A skin soothing-product like petroleum jelly.  If you vomit while drinking the bowel prep, take a break for up to 60 minutes and then begin the bowel prep again. If vomiting continues and you cannot take the bowel prep without vomiting, call your health care provider.  General instructions  Ask your health care provider about changing or stopping your regular medicines. This is especially important if you are taking diabetes medicines or blood thinners.  Plan to have someone take you home from the hospital or clinic. What happens during the procedure?  An IV tube may be inserted into one of your veins.  You will be given medicine to help you relax (sedative).  To reduce your risk of infection: ? Your health care team will wash or sanitize their hands. ? Your anal area will be washed with soap.  You will be asked to lie on your side with your knees bent.  Your health care provider will lubricate a long, thin, flexible tube. The tube will have a camera and a light on the end.  The tube will be inserted into your anus.  The tube will be gently eased through your rectum and colon.  Air will be delivered into your colon to keep it open. You may feel some pressure or cramping.  The camera will be used to take  images during the procedure.  A small tissue sample may be removed from your body to be examined under a microscope (biopsy). If any potential problems are found, the tissue will be sent to a lab for testing.  If small polyps are found, your health care provider may remove them and have them checked for cancer cells.  The tube that was inserted into your anus will be slowly removed. The procedure may vary among health care providers and hospitals. What happens after the procedure?  Your blood pressure, heart rate, breathing rate, and blood oxygen level will be monitored until the medicines you were given have worn off.  Do not drive for 24 hours after the exam.  You may have a small amount of blood in your stool.  You may pass gas and have mild abdominal cramping or bloating due to the air that was used to inflate your colon during the exam.  It is up to you to get the  results of your procedure. Ask your health care provider, or the department performing the procedure, when your results will be ready. This information is not intended to replace advice given to you by your health care provider. Make sure you discuss any questions you have with your health care provider. Document Released: 09/10/2000 Document Revised: 07/14/2016 Document Reviewed: 11/25/2015 Elsevier Interactive Patient Education  2018 [employer]. Esophagogastroduodenoscopy Esophagogastroduodenoscopy (EGD) is a procedure to examine the lining of the esophagus, stomach, and first part of the small intestine (duodenum). This procedure is done to check for problems such as inflammation, bleeding, ulcers, or growths. During this procedure, a long, flexible, lighted tube with a camera attached (endoscope) is inserted down the throat. Tell a health care provider about:  Any allergies you have.  All medicines you are taking, including vitamins, herbs, eye drops, creams, and over-the-counter medicines.  Any problems you or  family members have had with anesthetic medicines.  Any blood disorders you have.  Any surgeries you have had.  Any medical conditions you have.  Whether you are pregnant or may be pregnant. What are the risks? Generally, this is a safe procedure. However, problems may occur, including:  Infection.  Bleeding.  A tear (perforation) in the esophagus, stomach, or duodenum.  Trouble breathing.  Excessive sweating.  Spasms of the larynx.  A slowed heartbeat.  Low blood pressure.  What happens before the procedure?  Follow instructions from your health care provider about eating or drinking restrictions.  Ask your health care provider about: ? Changing or stopping your regular medicines. This is especially important if you are taking diabetes medicines or blood thinners. ? Taking medicines such as aspirin and ibuprofen. These medicines can thin your blood. Do not take these medicines before your procedure if your health care provider instructs you not to.  Plan to have someone take you home after the procedure.  If you wear dentures, be ready to remove them before the procedure. What happens during the procedure?  To reduce your risk of infection, your health care team will wash or sanitize their hands.  An IV tube will be put in a vein in your hand or arm. You will get medicines and fluids through this tube.  You will be given one or more of the following: ? A medicine to help you relax (sedative). ? A medicine to numb the area (local anesthetic). This medicine may be sprayed into your throat. It will make you feel more comfortable and keep you from gagging or coughing during the procedure. ? A medicine for pain.  A mouth guard may be placed in your mouth to protect your teeth and to keep you from biting on the endoscope.  You will be asked to lie on your left side.  The endoscope will be lowered down your throat into your esophagus, stomach, and duodenum.  Air will  be put into the endoscope. This will help your health care provider see better.  The lining of your esophagus, stomach, and duodenum will be examined.  Your health care provider may: ? Take a tissue sample so it can be looked at in a lab (biopsy). ? Remove growths. ? Remove objects (foreign bodies) that are stuck. ? Treat any bleeding with medicines or other devices that stop tissue from bleeding. ? Widen (dilate) or stretch narrowed areas of your esophagus and stomach.  The endoscope will be taken out. The procedure may vary among health care providers and hospitals. What happens after the procedure?  Your blood pressure, heart rate, breathing rate, and blood oxygen level will be monitored often until the medicines you were given have worn off.  Do not eat or drink anything until the numbing medicine has worn off and your gag reflex has returned. This information is not intended to replace advice given to you by your health care provider. Make sure you discuss any questions you have with your health care provider. Document Released: 01/14/2005 Document Revised: 02/19/2016 Document Reviewed: 08/07/2015 Elsevier Interactive Patient Education  [employer].

## 2017-10-03 ENCOUNTER — Encounter (HOSPITAL_COMMUNITY): Payer: Self-pay

## 2017-10-03 ENCOUNTER — Encounter (HOSPITAL_COMMUNITY)
Admission: RE | Admit: 2017-10-03 | Discharge: 2017-10-03 | Disposition: A | Discharge status: Home / Self Care | Payer: Medicare Other | Source: Ambulatory Visit | Attending: Internal Medicine | Admitting: Internal Medicine

## 2017-10-03 ENCOUNTER — Inpatient Hospital Stay (HOSPITAL_COMMUNITY)
Admission: RE | Admit: 2017-10-03 | Discharge: 2017-10-03 | Disposition: A | Discharge status: Home / Self Care | Payer: Medicare Other | Source: Ambulatory Visit

## 2017-10-03 ENCOUNTER — Other Ambulatory Visit: Payer: Self-pay

## 2017-10-03 DIAGNOSIS — C163 Malignant neoplasm of pyloric antrum: Secondary | ICD-10-CM | POA: Diagnosis not present

## 2017-10-03 DIAGNOSIS — Z1211 Encounter for screening for malignant neoplasm of colon: Secondary | ICD-10-CM | POA: Diagnosis not present

## 2017-10-03 DIAGNOSIS — K210 Gastro-esophageal reflux disease with esophagitis: Secondary | ICD-10-CM | POA: Diagnosis not present

## 2017-10-03 DIAGNOSIS — I2510 Atherosclerotic heart disease of native coronary artery without angina pectoris: Secondary | ICD-10-CM | POA: Diagnosis not present

## 2017-10-03 DIAGNOSIS — K449 Diaphragmatic hernia without obstruction or gangrene: Secondary | ICD-10-CM | POA: Diagnosis not present

## 2017-10-03 DIAGNOSIS — E1122 Type 2 diabetes mellitus with diabetic chronic kidney disease: Secondary | ICD-10-CM | POA: Diagnosis not present

## 2017-10-03 DIAGNOSIS — Z9889 Other specified postprocedural states: Secondary | ICD-10-CM | POA: Diagnosis not present

## 2017-10-03 DIAGNOSIS — Z806 Family history of leukemia: Secondary | ICD-10-CM | POA: Diagnosis not present

## 2017-10-03 DIAGNOSIS — E785 Hyperlipidemia, unspecified: Secondary | ICD-10-CM | POA: Diagnosis not present

## 2017-10-03 DIAGNOSIS — Z79899 Other long term (current) drug therapy: Secondary | ICD-10-CM | POA: Diagnosis not present

## 2017-10-03 DIAGNOSIS — Z833 Family history of diabetes mellitus: Secondary | ICD-10-CM | POA: Diagnosis not present

## 2017-10-03 DIAGNOSIS — R1310 Dysphagia, unspecified: Secondary | ICD-10-CM | POA: Diagnosis not present

## 2017-10-03 DIAGNOSIS — D122 Benign neoplasm of ascending colon: Secondary | ICD-10-CM | POA: Diagnosis not present

## 2017-10-03 DIAGNOSIS — Z8673 Personal history of transient ischemic attack (TIA), and cerebral infarction without residual deficits: Secondary | ICD-10-CM | POA: Diagnosis not present

## 2017-10-03 DIAGNOSIS — Z7982 Long term (current) use of aspirin: Secondary | ICD-10-CM | POA: Diagnosis not present

## 2017-10-03 DIAGNOSIS — C165 Malignant neoplasm of lesser curvature of stomach, unspecified: Secondary | ICD-10-CM | POA: Diagnosis not present

## 2017-10-03 DIAGNOSIS — N182 Chronic kidney disease, stage 2 (mild): Secondary | ICD-10-CM | POA: Diagnosis not present

## 2017-10-03 DIAGNOSIS — Z8249 Family history of ischemic heart disease and other diseases of the circulatory system: Secondary | ICD-10-CM | POA: Diagnosis not present

## 2017-10-03 DIAGNOSIS — I739 Peripheral vascular disease, unspecified: Secondary | ICD-10-CM | POA: Diagnosis not present

## 2017-10-03 DIAGNOSIS — D123 Benign neoplasm of transverse colon: Secondary | ICD-10-CM | POA: Diagnosis not present

## 2017-10-03 DIAGNOSIS — I129 Hypertensive chronic kidney disease with stage 1 through stage 4 chronic kidney disease, or unspecified chronic kidney disease: Secondary | ICD-10-CM | POA: Diagnosis not present

## 2017-10-03 DIAGNOSIS — Z800 Family history of malignant neoplasm of digestive organs: Secondary | ICD-10-CM | POA: Diagnosis not present

## 2017-10-03 DIAGNOSIS — Z87891 Personal history of nicotine dependence: Secondary | ICD-10-CM | POA: Diagnosis not present

## 2017-10-03 DIAGNOSIS — K5730 Diverticulosis of large intestine without perforation or abscess without bleeding: Secondary | ICD-10-CM | POA: Diagnosis not present

## 2017-10-03 LAB — CBC WITH DIFFERENTIAL/PLATELET
BASOS ABS: 0 10*3/uL (ref 0.0–0.1)
Basophils Relative: 0 %
EOS PCT: 1 %
Eosinophils Absolute: 0.1 10*3/uL (ref 0.0–0.7)
HEMATOCRIT: 38.9 % — AB (ref 39.0–52.0)
Hemoglobin: 12.9 g/dL — ABNORMAL LOW (ref 13.0–17.0)
LYMPHS PCT: 28 %
Lymphs Abs: 3.7 10*3/uL (ref 0.7–4.0)
MCH: 29.4 pg (ref 26.0–34.0)
MCHC: 33.2 g/dL (ref 30.0–36.0)
MCV: 88.6 fL (ref 78.0–100.0)
MONO ABS: 0.7 10*3/uL (ref 0.1–1.0)
MONOS PCT: 5 %
Neutro Abs: 8.5 10*3/uL — ABNORMAL HIGH (ref 1.7–7.7)
Neutrophils Relative %: 66 %
PLATELETS: 240 10*3/uL (ref 150–400)
RBC: 4.39 MIL/uL (ref 4.22–5.81)
RDW: 14.2 % (ref 11.5–15.5)
WBC: 12.9 10*3/uL — ABNORMAL HIGH (ref 4.0–10.5)

## 2017-10-03 LAB — BASIC METABOLIC PANEL
ANION GAP: 10 (ref 5–15)
BUN: 13 mg/dL (ref 6–20)
CALCIUM: 9.2 mg/dL (ref 8.9–10.3)
CO2: 22 mmol/L (ref 22–32)
CREATININE: 1.12 mg/dL (ref 0.61–1.24)
Chloride: 104 mmol/L (ref 101–111)
GFR calc Af Amer: >60 mL/min (ref >60)
GLUCOSE: 81 mg/dL (ref 65–99)
Potassium: 3.3 mmol/L — ABNORMAL LOW (ref 3.5–5.1)
Sodium: 136 mmol/L (ref 135–145)

## 2017-10-04 ENCOUNTER — Other Ambulatory Visit: Payer: Self-pay | Admitting: Nurse Practitioner

## 2017-10-04 DIAGNOSIS — E876 Hypokalemia: Secondary | ICD-10-CM

## 2017-10-04 MED ORDER — POTASSIUM CHLORIDE ER 10 MEQ PO TBCR
10.0000 meq | EXTENDED_RELEASE_TABLET | Freq: Two times a day (BID) | ORAL | 0 refills | Status: DC
Start: 2017-10-04 — End: 2017-11-28

## 2017-10-04 NOTE — Progress Notes (Signed)
Rx for KCl sent per result note.

## 2017-10-05 ENCOUNTER — Telehealth: Payer: Self-pay

## 2017-10-05 NOTE — Telephone Encounter (Signed)
Called pt to see if he can arrive at 10:30am for his procedure tomorrow. He is unable to arrive sooner d/t he already has transportation scheduled. Called and informed Endo scheduler.

## 2017-10-06 ENCOUNTER — Encounter (HOSPITAL_COMMUNITY): Payer: Self-pay | Admitting: *Deleted

## 2017-10-06 ENCOUNTER — Ambulatory Visit (HOSPITAL_COMMUNITY): Payer: Medicare Other | Admitting: Anesthesiology

## 2017-10-06 ENCOUNTER — Encounter (HOSPITAL_COMMUNITY)
Admission: RE | Disposition: A | Discharge status: Home / Self Care | Payer: Self-pay | Source: Ambulatory Visit | Attending: Internal Medicine

## 2017-10-06 ENCOUNTER — Ambulatory Visit (HOSPITAL_COMMUNITY)
Admission: RE | Admit: 2017-10-06 | Discharge: 2017-10-06 | Disposition: A | Discharge status: Home / Self Care | Payer: Medicare Other | Source: Ambulatory Visit | Attending: Internal Medicine | Admitting: Internal Medicine

## 2017-10-06 DIAGNOSIS — Z1212 Encounter for screening for malignant neoplasm of rectum: Secondary | ICD-10-CM

## 2017-10-06 DIAGNOSIS — C163 Malignant neoplasm of pyloric antrum: Secondary | ICD-10-CM | POA: Insufficient documentation

## 2017-10-06 DIAGNOSIS — I2510 Atherosclerotic heart disease of native coronary artery without angina pectoris: Secondary | ICD-10-CM | POA: Insufficient documentation

## 2017-10-06 DIAGNOSIS — K3189 Other diseases of stomach and duodenum: Secondary | ICD-10-CM | POA: Diagnosis not present

## 2017-10-06 DIAGNOSIS — Z9889 Other specified postprocedural states: Secondary | ICD-10-CM | POA: Insufficient documentation

## 2017-10-06 DIAGNOSIS — Z79899 Other long term (current) drug therapy: Secondary | ICD-10-CM | POA: Diagnosis not present

## 2017-10-06 DIAGNOSIS — D122 Benign neoplasm of ascending colon: Secondary | ICD-10-CM

## 2017-10-06 DIAGNOSIS — K219 Gastro-esophageal reflux disease without esophagitis: Secondary | ICD-10-CM | POA: Diagnosis not present

## 2017-10-06 DIAGNOSIS — R1310 Dysphagia, unspecified: Secondary | ICD-10-CM

## 2017-10-06 DIAGNOSIS — Z8673 Personal history of transient ischemic attack (TIA), and cerebral infarction without residual deficits: Secondary | ICD-10-CM | POA: Diagnosis not present

## 2017-10-06 DIAGNOSIS — Z1211 Encounter for screening for malignant neoplasm of colon: Secondary | ICD-10-CM

## 2017-10-06 DIAGNOSIS — Z7982 Long term (current) use of aspirin: Secondary | ICD-10-CM | POA: Insufficient documentation

## 2017-10-06 DIAGNOSIS — I129 Hypertensive chronic kidney disease with stage 1 through stage 4 chronic kidney disease, or unspecified chronic kidney disease: Secondary | ICD-10-CM | POA: Diagnosis not present

## 2017-10-06 DIAGNOSIS — Z8261 Family history of arthritis: Secondary | ICD-10-CM | POA: Insufficient documentation

## 2017-10-06 DIAGNOSIS — I739 Peripheral vascular disease, unspecified: Secondary | ICD-10-CM | POA: Insufficient documentation

## 2017-10-06 DIAGNOSIS — Z8249 Family history of ischemic heart disease and other diseases of the circulatory system: Secondary | ICD-10-CM | POA: Diagnosis not present

## 2017-10-06 DIAGNOSIS — Z833 Family history of diabetes mellitus: Secondary | ICD-10-CM | POA: Insufficient documentation

## 2017-10-06 DIAGNOSIS — Z800 Family history of malignant neoplasm of digestive organs: Secondary | ICD-10-CM | POA: Diagnosis not present

## 2017-10-06 DIAGNOSIS — K5730 Diverticulosis of large intestine without perforation or abscess without bleeding: Secondary | ICD-10-CM | POA: Diagnosis not present

## 2017-10-06 DIAGNOSIS — Z823 Family history of stroke: Secondary | ICD-10-CM | POA: Insufficient documentation

## 2017-10-06 DIAGNOSIS — D123 Benign neoplasm of transverse colon: Secondary | ICD-10-CM | POA: Diagnosis not present

## 2017-10-06 DIAGNOSIS — K209 Esophagitis, unspecified: Secondary | ICD-10-CM | POA: Diagnosis not present

## 2017-10-06 DIAGNOSIS — Z806 Family history of leukemia: Secondary | ICD-10-CM | POA: Diagnosis not present

## 2017-10-06 DIAGNOSIS — K210 Gastro-esophageal reflux disease with esophagitis: Secondary | ICD-10-CM | POA: Insufficient documentation

## 2017-10-06 DIAGNOSIS — C169 Malignant neoplasm of stomach, unspecified: Secondary | ICD-10-CM | POA: Diagnosis not present

## 2017-10-06 DIAGNOSIS — K635 Polyp of colon: Secondary | ICD-10-CM | POA: Diagnosis not present

## 2017-10-06 DIAGNOSIS — K5900 Constipation, unspecified: Secondary | ICD-10-CM

## 2017-10-06 DIAGNOSIS — C165 Malignant neoplasm of lesser curvature of stomach, unspecified: Secondary | ICD-10-CM | POA: Insufficient documentation

## 2017-10-06 DIAGNOSIS — K449 Diaphragmatic hernia without obstruction or gangrene: Secondary | ICD-10-CM | POA: Diagnosis not present

## 2017-10-06 DIAGNOSIS — N182 Chronic kidney disease, stage 2 (mild): Secondary | ICD-10-CM | POA: Diagnosis not present

## 2017-10-06 DIAGNOSIS — Z87891 Personal history of nicotine dependence: Secondary | ICD-10-CM | POA: Insufficient documentation

## 2017-10-06 DIAGNOSIS — R0989 Other specified symptoms and signs involving the circulatory and respiratory systems: Secondary | ICD-10-CM

## 2017-10-06 DIAGNOSIS — E1122 Type 2 diabetes mellitus with diabetic chronic kidney disease: Secondary | ICD-10-CM | POA: Diagnosis not present

## 2017-10-06 DIAGNOSIS — E785 Hyperlipidemia, unspecified: Secondary | ICD-10-CM | POA: Insufficient documentation

## 2017-10-06 HISTORY — PX: BIOPSY: SHX5522

## 2017-10-06 HISTORY — PX: ESOPHAGOGASTRODUODENOSCOPY (EGD) WITH PROPOFOL: SHX5813

## 2017-10-06 HISTORY — PX: POLYPECTOMY: SHX5525

## 2017-10-06 HISTORY — PX: COLONOSCOPY WITH PROPOFOL: SHX5780

## 2017-10-06 SURGERY — COLONOSCOPY WITH PROPOFOL
Anesthesia: Monitor Anesthesia Care

## 2017-10-06 MED ORDER — MIDAZOLAM HCL 5 MG/5ML IJ SOLN
INTRAMUSCULAR | Status: DC | PRN
  Administered 2017-10-06: 2 mg via INTRAVENOUS

## 2017-10-06 MED ORDER — CHLORHEXIDINE GLUCONATE CLOTH 2 % EX PADS: 6.0000 | MEDICATED_PAD | Freq: Once | CUTANEOUS | Status: DC

## 2017-10-06 MED ORDER — MIDAZOLAM HCL 2 MG/2ML IJ SOLN
INTRAMUSCULAR | Status: AC
  Filled 2017-10-06: qty 2

## 2017-10-06 MED ORDER — DEXAMETHASONE SODIUM PHOSPHATE 4 MG/ML IJ SOLN
INTRAMUSCULAR | Status: AC
  Filled 2017-10-06: qty 1

## 2017-10-06 MED ORDER — MIDAZOLAM HCL 2 MG/2ML IJ SOLN
1.0000 mg | INTRAMUSCULAR | Status: AC
  Administered 2017-10-06: 2 mg via INTRAVENOUS

## 2017-10-06 MED ORDER — LIDOCAINE VISCOUS 2 % MT SOLN
15.0000 mL | Freq: Once | OROMUCOSAL | Status: AC
  Administered 2017-10-06: 6 mL via OROMUCOSAL

## 2017-10-06 MED ORDER — LIDOCAINE VISCOUS 2 % MT SOLN
OROMUCOSAL | Status: AC
  Filled 2017-10-06: qty 15

## 2017-10-06 MED ORDER — FENTANYL CITRATE (PF) 100 MCG/2ML IJ SOLN
INTRAMUSCULAR | Status: AC
  Filled 2017-10-06: qty 2

## 2017-10-06 MED ORDER — PROPOFOL 10 MG/ML IV BOLUS
INTRAVENOUS | Status: AC
  Filled 2017-10-06: qty 20

## 2017-10-06 MED ORDER — DIPHENHYDRAMINE HCL 50 MG/ML IJ SOLN
INTRAMUSCULAR | Status: AC
  Filled 2017-10-06: qty 1

## 2017-10-06 MED ORDER — LACTATED RINGERS IV SOLN
INTRAVENOUS | Status: DC
  Administered 2017-10-06: 12:00:00 via INTRAVENOUS

## 2017-10-06 MED ORDER — FENTANYL CITRATE (PF) 100 MCG/2ML IJ SOLN
25.0000 ug | Freq: Once | INTRAMUSCULAR | Status: AC
  Administered 2017-10-06: 25 ug via INTRAVENOUS

## 2017-10-06 MED ORDER — DEXAMETHASONE SODIUM PHOSPHATE 4 MG/ML IJ SOLN
4.0000 mg | INTRAMUSCULAR | Status: AC
  Administered 2017-10-06: 4 mg via INTRAVENOUS

## 2017-10-06 MED ORDER — EPHEDRINE SULFATE 50 MG/ML IJ SOLN
INTRAMUSCULAR | Status: DC | PRN
  Administered 2017-10-06: 10 mg via INTRAVENOUS

## 2017-10-06 MED ORDER — PROPOFOL 500 MG/50ML IV EMUL
INTRAVENOUS | Status: DC | PRN
  Administered 2017-10-06: 125 ug/kg/min via INTRAVENOUS
  Administered 2017-10-06: 13:00:00 via INTRAVENOUS

## 2017-10-06 MED ORDER — DIPHENHYDRAMINE HCL 50 MG/ML IJ SOLN
25.0000 mg | Freq: Once | INTRAMUSCULAR | Status: AC
  Administered 2017-10-06: 25 mg via INTRAVENOUS

## 2017-10-06 NOTE — Anesthesia Postprocedure Evaluation (Signed)
Anesthesia Post Note  Patient: Todd [name]  Procedure(s) Performed: COLONOSCOPY WITH PROPOFOL (N/A ) ESOPHAGOGASTRODUODENOSCOPY (EGD) WITH PROPOFOL (N/A ) BIOPSY POLYPECTOMY  Patient location during evaluation: PACU Anesthesia Type: MAC Level of consciousness: awake and alert and oriented Pain management: pain level controlled Vital Signs Assessment: post-procedure vital signs reviewed and stable Respiratory status: spontaneous breathing Cardiovascular status: blood pressure returned to baseline and stable Postop Assessment: adequate PO intake Anesthetic complications: no     Last Vitals:  Vitals:   10/06/17 1245 10/06/17 1346  BP:  105/76  Pulse: 78 88  Resp: 18 (!) 28  Temp:  36.6 C  SpO2: 99% 95%    Last Pain:  Vitals:   10/06/17 1153  TempSrc: Oral                 Michel Hendon

## 2017-10-06 NOTE — Progress Notes (Signed)
Patient waiting for [employer] bus for transportation to pick him up.

## 2017-10-06 NOTE — Anesthesia Preprocedure Evaluation (Addendum)
Anesthesia Evaluation  Patient identified by MRN, date of birth, ID band Patient awake    Reviewed: Allergy & Precautions, NPO status , Patient's Chart, lab work & pertinent test results  Airway Mallampati: II  TM Distance: >3 FB Neck ROM: Full    Dental  (+) Edentulous Upper, Missing   Pulmonary former smoker,    breath sounds clear to auscultation       Cardiovascular hypertension, Pt. on medications + CAD and + Peripheral Vascular Disease ( a. bilateral superficial femoral artery stents in May/August of 2012. b. 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), Residual Symptoms    GI/Hepatic GERD  ,  Endo/Other    Renal/GU Renal disease     Musculoskeletal   Abdominal   Peds  Hematology   Anesthesia Other Findings   Reproductive/Obstetrics                            Anesthesia Physical Anesthesia Plan  ASA: III  Anesthesia Plan: MAC   Post-op Pain Management:    Induction: Intravenous  PONV Risk Score and Plan:   Airway Management Planned: Simple Face Mask  Additional Equipment:   Intra-op Plan:   Post-operative Plan:   Informed Consent: I have reviewed the patients History and Physical, chart, labs and discussed the procedure including the risks, benefits and alternatives for the proposed anesthesia with the patient or authorized representative who has indicated his/her understanding and acceptance.     Plan Discussed with:   Anesthesia Plan Comments:         Anesthesia Quick Evaluation

## 2017-10-06 NOTE — Discharge Instructions (Signed)
°Colonoscopy °Discharge Instructions ° °Read the instructions outlined below and refer to this sheet in the next few weeks. These discharge instructions provide you with general information on caring for yourself after you leave the hospital. Your doctor may also give you specific instructions. While your treatment has been planned according to the most current medical practices available, unavoidable complications occasionally occur. If you have any problems or questions after discharge, call Dr. Rourk at [phone]. °ACTIVITY °· You may resume your regular activity, but move at a slower pace for the next 24 hours.  °· Take frequent rest periods for the next 24 hours.  °· Walking will help get rid of the air and reduce the bloated feeling in your belly (abdomen).  °· No driving for 24 hours (because of the medicine (anesthesia) used during the test).   °· Do not sign any important legal documents or operate any machinery for 24 hours (because of the anesthesia used during the test).  °NUTRITION °· Drink plenty of fluids.  °· You may resume your normal diet as instructed by your doctor.  °· Begin with a light meal and progress to your normal diet. Heavy or fried foods are harder to digest and may make you feel sick to your stomach (nauseated).  °· Avoid alcoholic beverages for 24 hours or as instructed.  °MEDICATIONS °· You may resume your normal medications unless your doctor tells you otherwise.  °WHAT YOU CAN EXPECT TODAY °· Some feelings of bloating in the abdomen.  °· Passage of more gas than usual.  °· Spotting of blood in your stool or on the toilet paper.  °IF YOU HAD POLYPS REMOVED DURING THE COLONOSCOPY: °· No aspirin products for 7 days or as instructed.  °· No alcohol for 7 days or as instructed.  °· Eat a soft diet for the next 24 hours.  °FINDING OUT THE RESULTS OF YOUR TEST °Not all test results are available during your visit. If your test results are not back during the visit, make an appointment  with your caregiver to find out the results. Do not assume everything is normal if you have not heard from your caregiver or the medical facility. It is important for you to follow up on all of your test results.  °SEEK IMMEDIATE MEDICAL ATTENTION IF: °· You have more than a spotting of blood in your stool.  °· Your belly is swollen (abdominal distention).  °· You are nauseated or vomiting.  °· You have a temperature over 101.  °· You have abdominal pain or discomfort that is severe or gets worse throughout the day.  °EGD °Discharge instructions °Please read the instructions outlined below and refer to this sheet in the next few weeks. These discharge instructions provide you with general information on caring for yourself after you leave the hospital. Your doctor may also give you specific instructions. While your treatment has been planned according to the most current medical practices available, unavoidable complications occasionally occur. If you have any problems or questions after discharge, please call your doctor. °ACTIVITY °· You may resume your regular activity but move at a slower pace for the next 24 hours.  °· Take frequent rest periods for the next 24 hours.  °· Walking will help expel (get rid of) the air and reduce the bloated feeling in your abdomen.  °· No driving for 24 hours (because of the anesthesia (medicine) used during the test).  °· You may shower.  °· Do not sign any important   legal documents or operate any machinery for 24 hours (because of the anesthesia used during the test).  NUTRITION  Drink plenty of fluids.   You may resume your normal diet.   Begin with a light meal and progress to your normal diet.   Avoid alcoholic beverages for 24 hours or as instructed by your caregiver.  MEDICATIONS  You may resume your normal medications unless your caregiver tells you otherwise.  WHAT YOU CAN EXPECT TODAY  You may experience abdominal discomfort such as a feeling of fullness  or gas pains.  FOLLOW-UP  Your doctor will discuss the results of your test with you.  SEEK IMMEDIATE MEDICAL ATTENTION IF ANY OF THE FOLLOWING OCCUR:  Excessive nausea (feeling sick to your stomach) and/or vomiting.   Severe abdominal pain and distention (swelling).   Trouble swallowing.   Temperature over 101 F (37.8 C).   Rectal bleeding or vomiting of blood.   GERD information provided  Colon polyp and diverticulosis information provided  Further recommendations to follow pending review of pathology report     PATIENT INSTRUCTIONS POST-ANESTHESIA  IMMEDIATELY FOLLOWING SURGERY:  Do not drive or operate machinery for the first twenty four hours after surgery.  Do not make any important decisions for twenty four hours after surgery or while taking narcotic pain medications or sedatives.  If you develop intractable nausea and vomiting or a severe headache please notify your doctor immediately.  FOLLOW-UP:  Please make an appointment with your surgeon as instructed. You do not need to follow up with anesthesia unless specifically instructed to do so.  WOUND CARE INSTRUCTIONS (if applicable):  Keep a dry clean dressing on the anesthesia/puncture wound site if there is drainage.  Once the wound has quit draining you may leave it open to air.  Generally you should leave the bandage intact for twenty four hours unless there is drainage.  If the epidural site drains for more than 36-48 hours please call the anesthesia department.  QUESTIONS?:  Please feel free to call your physician or the hospital operator if you have any questions, and they will be happy to assist you.        Gastroesophageal Reflux Disease, Adult Normally, food travels down the esophagus and stays in the stomach to be digested. If a person has gastroesophageal reflux disease (GERD), food and stomach acid move back up into the esophagus. When this happens, the esophagus becomes sore and swollen (inflamed).  Over time, GERD can make small holes (ulcers) in the lining of the esophagus. Follow these instructions at home: Diet  Follow a diet as told by your doctor. You may need to avoid foods and drinks such as: ? Coffee and tea (with or without caffeine). ? Drinks that contain alcohol. ? Energy drinks and sports drinks. ? Carbonated drinks or sodas. ? Chocolate and cocoa. ? Peppermint and mint flavorings. ? Garlic and onions. ? Horseradish. ? Spicy and acidic foods, such as peppers, chili powder, curry powder, vinegar, hot sauces, and BBQ sauce. ? Citrus fruit juices and citrus fruits, such as oranges, lemons, and limes. ? Tomato-based foods, such as red sauce, chili, salsa, and pizza with red sauce. ? Fried and fatty foods, such as donuts, french fries, potato chips, and high-fat dressings. ? High-fat meats, such as hot dogs, rib eye steak, sausage, ham, and bacon. ? High-fat dairy items, such as whole milk, butter, and cream cheese.  Eat small meals often. Avoid eating large meals.  Avoid drinking large amounts of liquid  with your meals.  Avoid eating meals during the 2-3 hours before bedtime.  Avoid lying down right after you eat.  Do not exercise right after you eat. General instructions  Pay attention to any changes in your symptoms.  Take over-the-counter and prescription medicines only as told by your doctor. Do not take aspirin, ibuprofen, or other NSAIDs unless your doctor says it is okay.  Do not use any tobacco products, including cigarettes, chewing tobacco, and e-cigarettes. If you need help quitting, ask your doctor.  Wear loose clothes. Do not wear anything tight around your waist.  Raise (elevate) the head of your bed about 6 inches (15 cm).  Try to lower your stress. If you need help doing this, ask your doctor.  If you are overweight, lose an amount of weight that is healthy for you. Ask your doctor about a safe weight loss goal.  Keep all follow-up visits as  told by your doctor. This is important. Contact a doctor if:  You have new symptoms.  You lose weight and you do not know why it is happening.  You have trouble swallowing, or it hurts to swallow.  You have wheezing or a cough that keeps happening.  Your symptoms do not get better with treatment.  You have a hoarse voice. Get help right away if:  You have pain in your arms, neck, jaw, teeth, or back.  You feel sweaty, dizzy, or light-headed.  You have chest pain or shortness of breath.  You throw up (vomit) and your throw up looks like blood or coffee grounds.  You pass out (faint).  Your poop (stool) is bloody or black.  You cannot swallow, drink, or eat. This information is not intended to replace advice given to you by your health care provider. Make sure you discuss any questions you have with your health care provider. Document Released: 03/01/2008 Document Revised: 02/19/2016 Document Reviewed: 01/08/2015 Elsevier Interactive Patient Education  [employer].     Diverticulosis Diverticulosis is a condition that develops when small pouches (diverticula) form in the wall of the large intestine (colon). The colon is where water is absorbed and stool is formed. The pouches form when the inside layer of the colon pushes through weak spots in the outer layers of the colon. You may have a few pouches or many of them. What are the causes? The cause of this condition is not known. What increases the risk? The following factors may make you more likely to develop this condition:  Being older than age 77. Your risk for this condition increases with age. Diverticulosis is rare among people younger than age 34. By age 40, many people have it.  Eating a low-fiber diet.  Having frequent constipation.  Being overweight.  Not getting enough exercise.  Smoking.  Taking over-the-counter pain medicines, like aspirin and ibuprofen.  Having a family history of  diverticulosis.  What are the signs or symptoms? In most people, there are no symptoms of this condition. If you do have symptoms, they may include:  Bloating.  Cramps in the abdomen.  Constipation or diarrhea.  Pain in the lower left side of the abdomen.  How is this diagnosed? This condition is most often diagnosed during an exam for other colon problems. Because diverticulosis usually has no symptoms, it often cannot be diagnosed independently. This condition may be diagnosed by:  Using a flexible scope to examine the colon (colonoscopy).  Taking an X-ray of the colon after dye has been  put into the colon (barium enema).  Doing a CT scan.  How is this treated? You may not need treatment for this condition if you have never developed an infection related to diverticulosis. If you have had an infection before, treatment may include:  Eating a high-fiber diet. This may include eating more fruits, vegetables, and grains.  Taking a fiber supplement.  Taking a live bacteria supplement (probiotic).  Taking medicine to relax your colon.  Taking antibiotic medicines.  Follow these instructions at home:  Drink 6-8 glasses of water or more each day to prevent constipation.  Try not to strain when you have a bowel movement.  If you have had an infection before: ? Eat more fiber as directed by your health care provider or your diet and nutrition specialist (dietitian). ? Take a fiber supplement or probiotic, if your health care provider approves.  Take over-the-counter and prescription medicines only as told by your health care provider.  If you were prescribed an antibiotic, take it as told by your health care provider. Do not stop taking the antibiotic even if you start to feel better.  Keep all follow-up visits as told by your health care provider. This is important. Contact a health care provider if:  You have pain in your abdomen.  You have bloating.  You have  cramps.  You have not had a bowel movement in 3 days. Get help right away if:  Your pain gets worse.  Your bloating becomes very bad.  You have a fever or chills, and your symptoms suddenly get worse.  You vomit.  You have bowel movements that are bloody or black.  You have bleeding from your rectum. Summary  Diverticulosis is a condition that develops when small pouches (diverticula) form in the wall of the large intestine (colon).  You may have a few pouches or many of them.  This condition is most often diagnosed during an exam for other colon problems.  If you have had an infection related to diverticulosis, treatment may include increasing the fiber in your diet, taking supplements, or taking medicines. This information is not intended to replace advice given to you by your health care provider. Make sure you discuss any questions you have with your health care provider. Document Released: 06/10/2004 Document Revised: 08/02/2016 Document Reviewed: 08/02/2016 Elsevier Interactive Patient Education  2017 [employer].     Colon Polyps Polyps are tissue growths inside the body. Polyps can grow in many places, including the large intestine (colon). A polyp may be a round bump or a mushroom-shaped growth. You could have one polyp or several. Most colon polyps are noncancerous (benign). However, some colon polyps can become cancerous over time. What are the causes? The exact cause of colon polyps is not known. What increases the risk? This condition is more likely to develop in people who:  Have a family history of colon cancer or colon polyps.  Are older than 60 or older than 45 if they are African American.  Have inflammatory bowel disease, such as ulcerative colitis or Crohn disease.  Are overweight.  Smoke cigarettes.  Do not get enough exercise.  Drink too much alcohol.  Eat a diet that is: ? High in fat and red meat. ? Low in fiber.  Had childhood  cancer that was treated with abdominal radiation.  What are the signs or symptoms? Most polyps do not cause symptoms. If you have symptoms, they may include:  Blood coming from your rectum when having  a bowel movement.  Blood in your stool.The stool may look dark red or black.  A change in bowel habits, such as constipation or diarrhea.  How is this diagnosed? This condition is diagnosed with a colonoscopy. This is a procedure that uses a lighted, flexible scope to look at the inside of your colon. How is this treated? Treatment for this condition involves removing any polyps that are found. Those polyps will then be tested for cancer. If cancer is found, your health care provider will talk to you about options for colon cancer treatment. Follow these instructions at home: Diet  Eat plenty of fiber, such as fruits, vegetables, and whole grains.  Eat foods that are high in calcium and vitamin D, such as milk, cheese, yogurt, eggs, liver, fish, and broccoli.  Limit foods high in fat, red meats, and processed meats, such as hot dogs, sausage, bacon, and lunch meats.  Maintain a healthy weight, or lose weight if recommended by your health care provider. General instructions  Do not smoke cigarettes.  Do not drink alcohol excessively.  Keep all follow-up visits as told by your health care provider. This is important. This includes keeping regularly scheduled colonoscopies. Talk to your health care provider about when you need a colonoscopy.  Exercise every day or as told by your health care provider. Contact a health care provider if:  You have new or worsening bleeding during a bowel movement.  You have new or increased blood in your stool.  You have a change in bowel habits.  You unexpectedly lose weight. This information is not intended to replace advice given to you by your health care provider. Make sure you discuss any questions you have with your health care  provider. Document Released: 06/09/2004 Document Revised: 02/19/2016 Document Reviewed: 08/04/2015 Elsevier Interactive Patient Education  [employer].

## 2017-10-06 NOTE — H&P (Signed)
@LOGO @   Primary Care Physician:  Lavella Lemons, PA Primary Gastroenterologist:  Dr. Gala Romney  Pre-Procedure History & Physical: HPI:  Todd [name] is a 57 y.o. male here for here for colorectal cancer screening via colonoscopy. Some globus and dysphagia related to a fish bone weeks ago. Now doing much better.  Dysphagia and globus symptoms have improved.   He takes Protonix daily. He is taking a fiber supplement bowel function is improved. EGD and colonoscopy now being done for plan.  Past Medical History:  Diagnosis Date  . CAD (coronary artery disease)    a. Cath 07/2012 following abnormal nuclear stress test: 20-30% LAD, 20-30% OM1, occluded branch off OM2 that fills slowly via collaterals, small nondominant RCA occluded proximally, fills slowly via collaterals.  . CKD (chronic kidney disease), stage II   . Hyperlipidemia   . Hypertension   . Peripheral vascular disease ([place])    a. bilateral superficial femoral artery stents in May/August of 2012. b. Left femoral to above-knee popliteal bypass in 08/2012.  Marland Kitchen Renal insufficiency   . Stroke [facility])    2008 right side weakness  . Tobacco abuse     Past Surgical History:  Procedure Laterality Date  . ABDOMINAL AORTAGRAM N/A 04/27/2012   Procedure: ABDOMINAL Maxcine Ham;  Surgeon: Conrad Hormigueros, MD;  [facility] CATH LAB;  Service: Cardiovascular;  Laterality: N/A;  . ABDOMINAL AORTAGRAM N/A 08/09/2014   Procedure: ABDOMINAL Maxcine Ham;  Surgeon: Elam Dutch, MD;  Location: [facility] CATH LAB;  Service: Cardiovascular;  Laterality: N/A;  . CARDIAC CATHETERIZATION    . COLONOSCOPY    . FEMORAL ARTERY - POPLITEAL ARTERY BYPASS GRAFT    . FEMORAL-POPLITEAL BYPASS GRAFT  09/26/2012   Procedure: BYPASS GRAFT FEMORAL-POPLITEAL ARTERY;  Surgeon: Elam Dutch, MD;  Location[facility] OR;  Service: Vascular;  Laterality: Left;  using 45mm Gore-Tex Propaten Ringed Graft, Vein patch on the proximal and distal anastomosis  . FEMORAL-POPLITEAL BYPASS GRAFT  Right 08/12/2014   Procedure:  FEMORAL-POPLITEAL ARTERY BYPASS WITH SAPHENOUS VEIN GRAFT, INTRAOPERATIVE ARTERIOGRAM;  Surgeon: Elam Dutch, MD;  Location: [place];  Service: Vascular;  Laterality: Right;  . INTRAOPERATIVE ARTERIOGRAM  08/12/2014   Procedure: INTRA OPERATIVE ARTERIOGRAM;  Surgeon: Elam Dutch, MD;  Location: [place];  Service: Vascular;;  . PR VEIN BYPASS GRAFT,AORTO-FEM-POP    . stents in bil legs      Prior to Admission medications   Medication Sig Start Date End Date Taking? Authorizing Provider  acetaminophen (TYLENOL) 500 MG tablet Take 500 mg by mouth every 6 (six) hours as needed for moderate pain or headache.   Yes [provider]  amLODipine (NORVASC) 10 MG tablet TAKE ONE TABLET BY MOUTH ONCE DAILY Patient taking differently: TAKE 10 MG BY MOUTH ONCE DAILY 10/28/16  Yes Arnoldo Lenis, MD  aspirin EC 81 MG tablet Take 81 mg by mouth daily.   Yes [provider]  lisinopril (PRINIVIL,ZESTRIL) 40 MG tablet Take 40 mg by mouth at bedtime.   Yes [provider]  pantoprazole (PROTONIX) 40 MG tablet Take 1 tablet (40 mg total) by mouth 2 (two) times daily before a meal. 08/30/17  Yes Carlis Stable, NP  potassium chloride (K-DUR) 10 MEQ tablet Take 1 tablet (10 mEq total) by mouth 2 (two) times daily for 3 days. 10/04/17 10/07/17 Yes Carlis Stable, NP  rosuvastatin (CRESTOR) 10 MG tablet TAKE ONE TABLET BY MOUTH ONCE DAILY Patient taking differently: TAKE 10 MG BY MOUTH  ONCE DAILY AT BEDTIME 06/06/17  Yes Branch, Alphonse Guild, MD  valACYclovir (VALTREX) 500 MG tablet Take 500 mg by mouth at bedtime.   Yes [provider]    Allergies as of 08/30/2017  . (No Known Allergies)    Family History  Problem Relation Age of Onset  . Diabetes Mother   . Heart disease Mother        before age 32- Triple BPG  . Cancer Mother        Luekemia  . Deep vein thrombosis Mother   . Hyperlipidemia Mother   . Hypertension Mother   . Heart attack  Mother   . Peripheral vascular disease Mother   . Stroke Mother        x 3  . Pancreatic cancer Mother   . Arthritis Father   . Colon cancer Neg Hx   . Gastric cancer Neg Hx   . Esophageal cancer Neg Hx     Social History   Socioeconomic History  . Marital status: Legally Separated    Spouse name: Not on file  . Number of children: Not on file  . Years of education: Not on file  . Highest education level: Not on file  Social Needs  . Financial resource strain: Not on file  . Food insecurity - worry: Not on file  . Food insecurity - inability: Not on file  . Transportation needs - medical: Not on file  . Transportation needs - non-medical: Not on file  Occupational History  . Not on file  Tobacco Use  . Smoking status: Former Smoker    Packs/day: 1.00    Years: 25.00    Pack years: 25.00    Types: Cigarettes    Start date: 11/16/1974    Last attempt to quit: 02/25/2017    Years since quitting: 0.6  . Smokeless tobacco: Never Used  Substance and Sexual Activity  . Alcohol use: No    Alcohol/week: 0.0 oz  . Drug use: Yes    Types: Marijuana    Comment: occ- last used 2016  . Sexual activity: Not Currently    Partners: Female  Other Topics Concern  . Not on file  Social History Narrative  . Not on file    Review of Systems: See HPI, otherwise negative ROS  Physical Exam: BP 124/87   Pulse 82   Temp 97.9 F (36.6 C) (Oral)   Resp 18   SpO2 99%  General:   Alert,  Well-developed, well-nourished, pleasant and cooperative in NAD Neck:  Supple; no masses or thyromegaly. No significant cervical adenopathy. Lungs:  Clear throughout to auscultation.   No wheezes, crackles, or rhonchi. No acute distress. Heart:  Regular rate and rhythm; no murmurs, clicks, rubs,  or gallops. Abdomen: Non-distended, normal bowel sounds.  Soft and nontender without appreciable mass or hepatosplenomegaly.  Pulses:  Normal pulses noted. Extremities:  Without clubbing or  edema.  Impression:  Pleasant 57 year old gentleman with recent globus, dysphagia and chest pain. By history, fishbone impaction previously. All of these symptoms have improved. He is in need of colorectal cancer screening at this time.  Recommendations: I have offered him both an EGD and a colonoscopy today per plan.  The risks, benefits, limitations, imponderables and alternatives regarding both EGD and colonoscopy have been reviewed with the patient. Questions have been answered. All parties agreeable.    Notice: This dictation was prepared with Dragon dictation along with smaller phrase technology. Any transcriptional errors that result from this  process are unintentional and may not be corrected upon review.

## 2017-10-06 NOTE — Transfer of Care (Signed)
Immediate Anesthesia Transfer of Care Note  Patient: Todd [name]  Procedure(s) Performed: COLONOSCOPY WITH PROPOFOL (N/A ) ESOPHAGOGASTRODUODENOSCOPY (EGD) WITH PROPOFOL (N/A ) BIOPSY POLYPECTOMY  Patient Location: PACU  Anesthesia Type:MAC  Level of Consciousness: drowsy  Airway & Oxygen Therapy: Patient Spontanous Breathing and Patient connected to nasal cannula oxygen  Post-op Assessment: Report given to RN and Post -op Vital signs reviewed and stable  Post vital signs: Reviewed and stable  Last Vitals:  Vitals:   10/06/17 1153 10/06/17 1245  BP: 124/87   Pulse: 82 78  Resp: 18 18  Temp: 36.6 C   SpO2: 99% 99%    Last Pain:  Vitals:   10/06/17 1153  TempSrc: Oral      Patients Stated Pain Goal: 5 (54/00/86 7619)  Complications: No apparent anesthesia complications

## 2017-10-06 NOTE — Op Note (Signed)
[facility] Patient Name: Todd [name] Procedure Date: 10/06/2017 11:28 AM MRN: [id number] Date of Birth: 1961/02/12 Attending MD: Gennette Pac , MD CSN: [id number] Age: 57 Admit Type: Outpatient Procedure:                Colonoscopy Indications:              Screening for colorectal malignant neoplasm Providers:                Gennette Pac, MD, Nena Polio, RN, Dyann Ruddle Referring MD:              Medicines:                Propofol per Anesthesia Complications:            No immediate complications. Estimated Blood Loss:     Estimated blood loss: none. Estimated blood loss:                            none. Estimated blood loss was minimal. Procedure:                Pre-Anesthesia Assessment:                           - Prior to the procedure, a History and Physical                            was performed, and patient medications and                            allergies were reviewed. The patient's tolerance of                            previous anesthesia was also reviewed. The risks                            and benefits of the procedure and the sedation                            options and risks were discussed with the patient.                            All questions were answered, and informed consent                            was obtained. Prior Anticoagulants: The patient has                            taken no previous anticoagulant or antiplatelet                            agents. ASA Grade Assessment: III - A patient with  severe systemic disease. After reviewing the risks                            and benefits, the patient was deemed in                            satisfactory condition to undergo the procedure.                           After obtaining informed consent, the colonoscope                            was passed under direct vision. Throughout the                             procedure, the patient's blood pressure, pulse, and                            oxygen saturations were monitored continuously. The                            EC-[id number] ([id number]) scope was introduced through                            the anus and advanced to the the cecum, identified                            by appendiceal orifice and ileocecal valve. The                            ileocecal valve, appendiceal orifice, and rectum                            were photographed. The ileocecal valve, appendiceal                            orifice, and rectum were photographed. The entire                            colon was well visualized. The quality of the bowel                            preparation was adequate. Scope In: 1:16:10 PM Scope Out: 1:35:46 PM Scope Withdrawal Time: 0 hours 11 minutes 18 seconds  Total Procedure Duration: 0 hours 19 minutes 36 seconds  Findings:      The perianal and digital rectal examinations were normal.      Scattered diverticula were found in the sigmoid colon.      Four sessile polyps were found in the splenic flexure and ascending       colon. The polyps were 4 to 6 mm in size. These polyps were removed with       a cold snare. Resection and retrieval were complete. Estimated blood       loss was minimal.      The  exam was otherwise without abnormality on direct and retroflexion       views. Impression:               - Diverticulosis in the sigmoid colon.                           - Four 4 to 6 mm polyps at the splenic flexure and                            in the ascending colon, removed with a cold snare.                            Resected and retrieved.                           - The examination was otherwise normal on direct                            and retroflexion views. Moderate Sedation:      Moderate (conscious) sedation was personally administered by an       anesthesia professional. The following parameters were monitored: oxygen        saturation, heart rate, blood pressure, respiratory rate, EKG, adequacy       of pulmonary ventilation, and response to care. Total physician       intraservice time was 35 minutes. Recommendation:           - Patient has a contact number available for                            emergencies. The signs and symptoms of potential                            delayed complications were discussed with the                            patient. Return to normal activities tomorrow.                            Written discharge instructions were provided to the                            patient.                           - Resume previous diet.                           - Continue present medications.                           - Await pathology results.                           - Repeat colonoscopy date to be determined after  pending pathology results are reviewed for                            surveillance based on pathology results.                           - Return to GI clinic (date not yet determined). Procedure Code(s):        --- Professional ---                           [phone], Colonoscopy, flexible; with removal of                            tumor(s), polyp(s), or other lesion(s) by snare                            technique Diagnosis Code(s):        --- Professional ---                           D12.3, Benign neoplasm of transverse colon (hepatic                            flexure or splenic flexure)                           D12.2, Benign neoplasm of ascending colon                           K57.30, Diverticulosis of large intestine without                            perforation or abscess without bleeding CPT copyright 2016 American Medical Association. All rights reserved. The codes documented in this report are preliminary and upon coder review may  be revised to meet current compliance requirements. Gerrit Friends. Stetson Pelaez, MD Gennette Pac,  MD 10/06/2017 1:43:41 PM This report has been signed electronically. Number of Addenda: 0

## 2017-10-06 NOTE — Op Note (Signed)
[facility] Patient Name: Todd [name] Procedure Date: 10/06/2017 11:34 AM MRN: [id number] Date of Birth: 1961-01-20 Attending MD: Gennette Pac , MD CSN: [id number] Age: 57 Admit Type: Outpatient Procedure:                Upper GI endoscopy Indications:              Dysphagia Providers:                Gennette Pac, MD, Nena Polio, RN, Dyann Ruddle Referring MD:             Vilinda Blanks. Leavy Cella PA, PA Medicines:                Propofol per Anesthesia Complications:            No immediate complications. Estimated Blood Loss:     Estimated blood loss was minimal. Procedure:                Pre-Anesthesia Assessment:                           - Prior to the procedure, a History and Physical                            was performed, and patient medications and                            allergies were reviewed. The patient's tolerance of                            previous anesthesia was also reviewed. The risks                            and benefits of the procedure and the sedation                            options and risks were discussed with the patient.                            All questions were answered, and informed consent                            was obtained. Prior Anticoagulants: The patient has                            taken no previous anticoagulant or antiplatelet                            agents. ASA Grade Assessment: III - A patient with                            severe systemic disease. After reviewing the risks  and benefits, the patient was deemed in                            satisfactory condition to undergo the procedure.                           After obtaining informed consent, the endoscope was                            passed under direct vision. Throughout the                            procedure, the patient's blood pressure, pulse, and                            oxygen  saturations were monitored continuously. The                            [id number] ([id number]) scope was introduced through the                            mouth, and advanced to the second part of duodenum.                            The upper GI endoscopy was accomplished without                            difficulty. The patient tolerated the procedure                            well. The upper GI endoscopy was accomplished                            without difficulty. The patient tolerated the                            procedure well. Scope In: 12:59:08 PM Scope Out: 1:09:09 PM Total Procedure Duration: 0 hours 10 minutes 1 second  Findings:      Esophagitis was found. Couple of tiny eserosions within 5 mm GE       junction. Tubular esophagus widely patent. No Barrett's epitheliueen.      Small hiatal hernia present. Mucosal changes were found in the stomach.       extensive infiltrative appearing process involving the antrum and lesser       curvature with overlying ulceration. Pylorus paten      The duodenal bulb and second portion of the duodenum were normal.       Multiple biopsies abnormal gastric mucosa taken with jumbo biopsy       forceps. Impression:               - Mild Erosive Reflux Esophagitis. Small hiatal                            hernia.                           -  Markedly abnormal appearing gastric mucosa with                            overlying ulceration concerning for infiltrating                            process.?"Status post biopsy.                           - Normal duodenal bulb and second portion of the                            duodenum. Moderate Sedation:      Moderate (conscious) sedation was personally administered by an       anesthesia professional. The following parameters were monitored: oxygen       saturation, heart rate, blood pressure, respiratory rate, EKG, adequacy       of pulmonary ventilation, and response to care. Total physician        intraservice time was 18 minutes. Recommendation:           - Patient has a contact number available for                            emergencies. The signs and symptoms of potential                            delayed complications were discussed with the                            patient. Return to normal activities tomorrow.                            Written discharge instructions were provided to the                            patient.                           - Resume previous diet.                           - Continue present medications. Follow-up on                            pathology. See colonoscopy report. Procedure Code(s):        --- Professional ---                           [phone], Esophagogastroduodenoscopy, flexible,                            transoral; with biopsy, single or multiple Diagnosis Code(s):        --- Professional ---                           K20.9, Esophagitis, unspecified  K31.89, Other diseases of stomach and duodenum                           R13.10, Dysphagia, unspecified CPT copyright 2016 American Medical Association. All rights reserved. The codes documented in this report are preliminary and upon coder review may  be revised to meet current compliance requirements. [name], MD Gennette Pac, MD 10/06/2017 1:50:52 PM This report has been signed electronically. Number of Addenda: 0

## 2017-10-10 ENCOUNTER — Ambulatory Visit: Payer: Medicare Other | Admitting: Gastroenterology

## 2017-10-10 ENCOUNTER — Encounter (HOSPITAL_COMMUNITY): Payer: Self-pay | Admitting: Internal Medicine

## 2017-10-13 ENCOUNTER — Other Ambulatory Visit: Payer: Self-pay

## 2017-10-13 DIAGNOSIS — C169 Malignant neoplasm of stomach, unspecified: Secondary | ICD-10-CM

## 2017-10-19 ENCOUNTER — Encounter (HOSPITAL_COMMUNITY): Payer: Self-pay | Admitting: Internal Medicine

## 2017-10-19 ENCOUNTER — Inpatient Hospital Stay (HOSPITAL_COMMUNITY): Payer: Medicare Other | Attending: Internal Medicine | Admitting: Internal Medicine

## 2017-10-19 VITALS — BP 144/94 | HR 75 | Temp 98.6°F | Resp 18 | Wt 201.0 lb

## 2017-10-19 DIAGNOSIS — Z800 Family history of malignant neoplasm of digestive organs: Secondary | ICD-10-CM | POA: Diagnosis not present

## 2017-10-19 DIAGNOSIS — Z809 Family history of malignant neoplasm, unspecified: Secondary | ICD-10-CM | POA: Diagnosis not present

## 2017-10-19 DIAGNOSIS — F17290 Nicotine dependence, other tobacco product, uncomplicated: Secondary | ICD-10-CM

## 2017-10-19 DIAGNOSIS — F1290 Cannabis use, unspecified, uncomplicated: Secondary | ICD-10-CM | POA: Diagnosis not present

## 2017-10-19 DIAGNOSIS — C160 Malignant neoplasm of cardia: Secondary | ICD-10-CM

## 2017-10-19 DIAGNOSIS — Z8673 Personal history of transient ischemic attack (TIA), and cerebral infarction without residual deficits: Secondary | ICD-10-CM | POA: Diagnosis not present

## 2017-10-19 DIAGNOSIS — C169 Malignant neoplasm of stomach, unspecified: Secondary | ICD-10-CM | POA: Insufficient documentation

## 2017-10-19 DIAGNOSIS — I739 Peripheral vascular disease, unspecified: Secondary | ICD-10-CM | POA: Insufficient documentation

## 2017-10-19 NOTE — Progress Notes (Signed)
[facility] Initial Outpatient Consultation  Name: Todd [name]        MRN: [id number]      Date: 10/31/2017    DOB:1961-03-19    REFERRING PHYSICIAN:    Lovey Newcomer, PA [address] [place], [place] [address]      REASON FOR REFERRAL:  Consultation for [name] requested by Dr. Leavy Cella for evaluation and discussion of treatment options for newly diagnosed adenocarcinoma of the stomach based on EGD and biopsy on 10/06/2017.    HISTORY OF PRESENT ILLNESS:Todd [name] is a 57 y.o. male who was in his usual state of health until around Christmas 2018 when he went camping in [place].  During that trip patient apparently had acute onset of epigastric pain associated with vomiting.  He reports retching with vomiting.  Sometime during that episode patient apparently attempted to put his fingers in the back of his throat to relieve the sensation of food being lodged thinking that he may have had a fish bone that was stuck in his throat.  This culminated in vomiting with small amount of blood.  He subsequently had relief from vomiting but he continued to have difficulty swallowing and hoarseness of voice.  Imaging of the neck showed posterior pharyngeal edema without evidence of any foreign object or fishbone.  The pain subsequently improved gradually.    He underwent a colonoscopy that showed tubular adenomas.  An EGD that revealed diffusely infiltrated wall of the stomach with superficial ulcerations.  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  Patient has been referred here for further consultation.  Patient remarkably is asymptomatic today he has had no further vomiting dysphagia or abdominal pain he denies any change of his bowel pattern no hematemesis since the procedure he has not lost any weight his appetite is stable.    His past medical history is significant for peripheral vascular disease, had a femoropopliteal  bypass. No known heart disease.   History of ischemic stroke several years ago with apparently 60-75% improvement of his cognition no residual neurologic deficits.     His family history is notable for patient's mother died of colon cancer in her 50s paternal niece and nephew both had cancers of unknown origin. He lives with his father and his sister he is married but has not been in touch with his wife for several years he has no living children.   He is active and takes care of himself he is currently on disability since the stroke.  He smokes weed and smokes cigarettes and cigars.  He denies alcoholism.     PAST MEDICAL HISTORY:    has a past medical history of CAD (coronary artery disease), CKD (chronic kidney disease), stage II, Hyperlipidemia, Hypertension, Peripheral vascular disease (HCC), Renal insufficiency, Stroke (HCC), and Tobacco abuse.   ALLERGIES: is allergic to potassium-containing compounds.     MEDICATIONS:  Damiane Maranan. Duman had no medications administered during this visit.    PAST SURGICAL HISTORY Past Surgical History:  Procedure Laterality Date  . ABDOMINAL AORTAGRAM N/A 04/27/2012   Procedure: ABDOMINAL Ronny Flurry;  Surgeon: Fransisco Hertz, MD;  Location: [facility] C[facility] LAB;  Service: Cardiovascular;  Laterality: N/A;  . ABDOMINAL AORTAGRAM N/A 08/09/2014   Procedure: ABDOMINAL Ronny Flurry;  Surgeon: Sherren Kerns, MD;  Location: [facility] CATH LAB;  Service: Cardiovascular;  Laterality: N/A;  . BIOPSY  10/06/2017   Procedure: BIOPSY;  Surgeon: Corbin Ade, MD;  [facility]:  [facility] ENDO SUITE;  Service: Endoscopy;;  gastric   . CARDIAC CATHETERIZATION    . COLONOSCOPY    . COLONOSCOPY WITH PROPOFOL N/A 10/06/2017   Procedure: COLONOSCOPY WITH PROPOFOL;  Surgeon: Corbin Ade, MD;  Location: [facility] ENDO SUITE;  Service: Endoscopy;  Laterality: N/A;  1:45pm - patient can't come earlier due to transportation  . ESOPHAGOGASTRODUODENOSCOPY (EGD) WITH PROPOFOL N/A 10/06/2017    Procedure: ESOPHAGOGASTRODUODENOSCOPY (EGD) WITH PROPOFOL;  Surgeon: Corbin Ade, MD;  Location: [facility] ENDO SUITE;  Service: Endoscopy;  Laterality: N/A;  . FEMORAL ARTERY - POPLITEAL ARTERY BYPASS GRAFT    . FEMORAL-POPLITEAL BYPASS GRAFT  09/26/2012   Procedure: BYPASS GRAFT FEMORAL-POPLITEAL ARTERY;  Surgeon: Sherren Kerns, MD;  Location: [facility] OR;  Service: Vascular;  Laterality: Left;  using 6mm Gore-Tex Propaten Ringed Graft, Vein patch on the proximal and distal anastomosis  . FEMORAL-POPLITEAL BYPASS GRAFT Right 08/12/2014   Procedure:  FEMORAL-POPLITEAL ARTERY BYPASS WITH SAPHENOUS VEIN GRAFT, INTRAOPERATIVE ARTERIOGRAM;  Surgeon: Sherren Kerns, MD;  Location: [facility] OR;  Service: Vascular;  Laterality: Right;  . INTRAOPERATIVE ARTERIOGRAM  08/12/2014   Procedure: INTRA OPERATIVE ARTERIOGRAM;  Surgeon: Sherren Kerns, MD;  Location: [facility] OR;  Service: Vascular;;  . POLYPECTOMY  10/06/2017   Procedure: POLYPECTOMY;  Surgeon: Corbin Ade, MD;  Location: [facility] ENDO SU[facility];  Service: Endoscopy;;  splenic flexure,  ascending  . PR VEIN BYPASS GRAFT,AORTO-FEM-POP    . stents in bil legs       FAMILY HISTORY: Family History  Problem Relation Age of Onset  . Diabetes Mother   . Heart disease Mother        before age 19- Triple BPG  . Cancer Mother        Luekemia  . Deep vein thrombosis Mother   . Hyperlipidemia Mother   . Hypertension Mother   . Heart attack Mother   . Peripheral vascular disease Mother   . Stroke Mother        x 3  . Pancreatic cancer Mother   . Arthritis Father   . Colon cancer Neg Hx   . Gastric cancer Neg Hx   . Esophageal cancer Neg Hx      SOCIAL HISTORY:  reports that he quit smoking about 8 months ago. His smoking use included cigarettes. He started smoking about 42 years ago. He has a 25.00 pack-year smoking history. he has never used smokeless tobacco. He reports that he uses drugs. Drug: Marijuana. He reports that he does not drink  alcohol.   PERFORMANCE STATUS: The patient's performance status is ECOG  0 - Asymptomatic  PHYSICAL EXAM: Most Recent Vital Signs: Blood pressure (!) 144/94, pulse 75, temperature 98.6 F (37 C), temperature source Oral, resp. rate 18, weight 201 lb (91.2 kg), SpO2 100 %.  Exam tall well-built gentleman no apparent distress oral cavity posterior pharyngeal space is normal neck supple no lymph nodes felt in the neck supraclavicular axillary areas lungs are clear heart sounds are regular without any murmurs abdomen soft nontender no hepatosplenomegaly bowel sounds are normal in all 4 extremities are without edema.  Lower extremities show scarring from venous grafts which are well-healed.  CNS he is alert awake oriented x3 no cerebellar signs gait is normal no motor or sensory deficits cranial nerves appears to be intact.  Psychiatric alert awake oriented x3 no gross focal sensorimotor deficits were noted.  Result review:  EGD by Dr. Rosita Fire Rourk dated 10/06/2017 has been reviewed that shows  a patent tubular esophagus with tiny erosions within 5 mm of GE junction no Barrett's epithelium small hiatal hernia but mucosal changes were found involving the antrum and lesser curvature with overlying ulceration with extensive infiltrative process pylorus was patent the duodenal bulb and second portion of duodenum were normal A colonoscopy on the same dayShowed 4 sessile polyps in the splenic flexure and ascending colon they were measuring about 4-6 mm and were removed. CT soft tissue neck from 08/10/2017 showed retropharyngeal fluid or edema that was nonspecific throughout the level of the pharynx no signs of perforation.Showed 4 sessile polyps in the splenic flexure and ascending colon they were measuring about 4-6 mm and were removed.  CT of the abdomen pelvis 1114 showed no evidence of metastatic disease aortic aneurysm measuring 3.7 cm and degenerative arthritis involving L5-S1 were noted.    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 CT abdomen does not show any enlarged lymph nodes or metastatic disease to the liver.   I will request a PET scan to rule out any hypermetabolic activity outside the stomach to complete the initial staging. Patient has a history of stroke but currently has no symptoms of residual weakness his cognitive function appears to be normal today.    MRI brain with and without contrast will also be requested for initial staging for gastric cancer.  His baseline CBC and CMP from 08/10/2017 are normal.  We will have him return following the scans to discuss results and plan on treatment.  He insists that he will not consider any treatment options that involve chemotherapy. If he has no metastasis, he will be a candidate for gastrectomy.  History of colon cancer in his mother,  young age at presentation of diffuse- signet ring cell  gastric cancer without any known risk factors suggests genetic predisposition ( CDH-1 ) mutation. He will need referral to genetic counseling,  we will discuss this more in detail at his next visit.  He was advised to quit smoking.   Towana Badger

## 2017-10-24 ENCOUNTER — Encounter
Admission: RE | Admit: 2017-10-24 | Discharge: 2017-10-24 | Disposition: A | Discharge status: Home / Self Care | Payer: Medicare Other | Source: Ambulatory Visit | Attending: Internal Medicine | Admitting: Internal Medicine

## 2017-10-24 DIAGNOSIS — C160 Malignant neoplasm of cardia: Secondary | ICD-10-CM | POA: Diagnosis not present

## 2017-10-24 DIAGNOSIS — C169 Malignant neoplasm of stomach, unspecified: Secondary | ICD-10-CM | POA: Diagnosis not present

## 2017-10-24 LAB — GLUCOSE, CAPILLARY: Glucose-Capillary: 90 mg/dL (ref 65–99)

## 2017-10-24 MED ORDER — FLUDEOXYGLUCOSE F - 18 (FDG) INJECTION
11.7600 | Freq: Once | INTRAVENOUS | Status: AC | PRN
  Administered 2017-10-24: 11.76 via INTRAVENOUS

## 2017-10-31 ENCOUNTER — Ambulatory Visit (HOSPITAL_COMMUNITY): Admission: RE | Admit: 2017-10-31 | Payer: Medicare Other | Source: Ambulatory Visit

## 2017-11-02 ENCOUNTER — Inpatient Hospital Stay (HOSPITAL_COMMUNITY): Payer: Medicare Other | Attending: Internal Medicine | Admitting: Internal Medicine

## 2017-11-02 ENCOUNTER — Other Ambulatory Visit: Payer: Self-pay

## 2017-11-02 ENCOUNTER — Encounter (HOSPITAL_COMMUNITY): Payer: Self-pay | Admitting: Internal Medicine

## 2017-11-02 VITALS — BP 117/76 | HR 76 | Temp 98.7°F | Resp 18

## 2017-11-02 DIAGNOSIS — C169 Malignant neoplasm of stomach, unspecified: Secondary | ICD-10-CM

## 2017-11-02 NOTE — H&P (View-Only) (Signed)
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  He has been doing well he has no new symptoms particularly with dysphagia has not had any recurrence.  Review of the PET scan from 10/24/2017 shows a small focus of hypermetabolism along the lesser curvature of the antrum of the stomach corresponding to recently diagnosed gastric neoplasm no associated lymphadenopathy or signs of metastatic disease in the neck chest abdomen or pelvis.  He does have a fusiform aneurysmal dilatation of the infrarenal abdominal aorta measuring 3.9 x 3.7 cm recommend follow-up has been recommended by ultrasound in 2 years.  Patient also was noted incidentally to have aortic atherosclerosis.  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  Histologically he appears to have a signet ring variety with diffuse involvement that are below the threshold of the PET scan.  It is encouraging that he does not have metastatic disease. I believe he will be a candidate for total gastrectomy.  I have discussed with Todd [name] who recommended that he be referred to general surgery for gastrectomy.  I will make that referral today.  But prior to that I would like to get an endoscopic ultrasound to complete the staging.  It is unclear with the presence of diffuse gastric cardia adenocarcinoma how useful and endoscopic ultrasound would be.  However if he does have lymph node involvement, he would still not be a candidate for partial gastrectomy.  I believe total gastrectomy and pathologic staging to determine if he would be a candidate for adjuvant radiotherapy or chemotherapy would be the right approach.  Patient's case has been submitted to multidisciplinary tumor conference for discussion.   Discussed with Todd [name], he recommended referral to general surgery  for gastrectomy. I' d like him to have EUS before that. He did not show up for the appointment for MRI brain this needs to be scheduled again. I will also request genetic counseling visit.  Looking for hereditary predisposition for diffuse gastric cancer. Return to see MD in approximately 6 weeks.  We will have the care coordinator follow-up appointments.  Patient is agreeable to the plan.  He is however adamant that he will not agree to any chemotherapy or radiotherapy even if it were recommended.  I told him that, depending on the staging if he needs chemotherapy or radiotherapy, we will need to sit down and discuss details of schedule side effects and then he can make an informed choice whether he wants to go ahead with that. I told him to schedule the MRI brain was ordered last visit.  His vitals are stable today medications have been reviewed please refer to my initial office visit dated 10/19/2017 for details.  Incidentally noted infrarenal abdominal aortic aneurysm and coronary atherosclerosis on the PET patient does see vascular surgery and cardiology he was advised to continue aspirin and statin and follow cardiology recommendations.

## 2017-11-02 NOTE — Patient Instructions (Signed)
[facility] at [facility] Discharge Instructions  RECOMMENDATIONS MADE BY THE CONSULTANT AND ANY TEST RESULTS WILL BE SENT TO YOUR REFERRING PHYSICIAN.  You were seen today by Dr. Peru  Thank you for choosing [facility] at [facility] to provide your oncology and hematology care.  To afford each patient quality time with our provider, please arrive at least 15 minutes before your scheduled appointment time.    If you have a lab appointment with the Cancer Center please come in thru the  Main Entrance and check in at the main information desk  You need to re-schedule your appointment should you arrive 10 or more minutes late.  We strive to give you quality time with our providers, and arriving late affects you and other patients whose appointments are after yours.  Also, if you no show three or more times for appointments you may be dismissed from the clinic at the providers discretion.     Again, thank you for choosing [facility].  Our hope is that these requests will decrease the amount of time that you wait before being seen by our physicians.       _____________________________________________________________  Should you have questions after your visit to [facility], please contact our office at [phone] between the hours of 8:30 a.m. and 4:30 p.m.  Voicemails left after 4:30 p.m. will not be returned until the following business day.  For prescription refill requests, have your pharmacy contact our office.       Resources For Cancer Patients and their Caregivers ? American Cancer Society: Can assist with transportation, wigs, general needs, runs Look Good Feel Better.        1-888-227-6333 ? Cancer Care: Provides financial assistance, online support groups, medication/co-pay assistance.  1-800-813-HOPE (4673) ? [facility] Assists [place] cancer patients and their families  through emotional , educational and financial support.  336-427-4357 ? [place] DSS Where to apply for food stamps, Medicaid and utility assistance. 336-342-1394 ? [facility]: Transportation to medical appointments. 336-347-2287 ? Social Security Administration: May apply for disability if have a Stage IV cancer. 336-342-7796 1-800-772-1213 ? [place] Aging, Disability and Transit Services: Assists with nutrition, care and transit needs. 336-349-2343  Cancer Center Support Programs: @10RELATIVEDAYS@ > Cancer Support Group  2nd Tuesday of the month 1pm-2pm, Journey Room  > Creative Journey  3rd Tuesday of the month 1130am-1pm, Journey Room  > Look Good Feel Better  1st Wednesday of the month 10am-12 noon, Journey Room (Call American Cancer Society to register 1-800-395-5775)     

## 2017-11-02 NOTE — Progress Notes (Signed)
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  He has been doing well he has no new symptoms particularly with dysphagia has not had any recurrence.  Review of the PET scan from 10/24/2017 shows a small focus of hypermetabolism along the lesser curvature of the antrum of the stomach corresponding to recently diagnosed gastric neoplasm no associated lymphadenopathy or signs of metastatic disease in the neck chest abdomen or pelvis.  He does have a fusiform aneurysmal dilatation of the infrarenal abdominal aorta measuring 3.9 x 3.7 cm recommend follow-up has been recommended by ultrasound in 2 years.  Patient also was noted incidentally to have aortic atherosclerosis.  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  Histologically he appears to have a signet ring variety with diffuse involvement that are below the threshold of the PET scan.  It is encouraging that he does not have metastatic disease. I believe he will be a candidate for total gastrectomy.  I have discussed with Dr. Servando Snare who recommended that he be referred to general surgery for gastrectomy.  I will make that referral today.  But prior to that I would like to get an endoscopic ultrasound to complete the staging.  It is unclear with the presence of diffuse gastric cardia adenocarcinoma how useful and endoscopic ultrasound would be.  However if he does have lymph node involvement, he would still not be a candidate for partial gastrectomy.  I believe total gastrectomy and pathologic staging to determine if he would be a candidate for adjuvant radiotherapy or chemotherapy would be the right approach.  Patient's case has been submitted to multidisciplinary tumor conference for discussion.   Discussed with Dr. Servando Snare, he recommended referral to general surgery  for gastrectomy. I' d like him to have EUS before that. He did not show up for the appointment for MRI brain this needs to be scheduled again. I will also request genetic counseling visit.  Looking for hereditary predisposition for diffuse gastric cancer. Return to see MD in approximately 6 weeks.  We will have the care coordinator follow-up appointments.  Patient is agreeable to the plan.  He is however adamant that he will not agree to any chemotherapy or radiotherapy even if it were recommended.  I told him that, depending on the staging if he needs chemotherapy or radiotherapy, we will need to sit down and discuss details of schedule side effects and then he can make an informed choice whether he wants to go ahead with that. I told him to schedule the MRI brain was ordered last visit.  His vitals are stable today medications have been reviewed please refer to my initial office visit dated 10/19/2017 for details.  Incidentally noted infrarenal abdominal aortic aneurysm and coronary atherosclerosis on the PET patient does see vascular surgery and cardiology he was advised to continue aspirin and statin and follow cardiology recommendations.

## 2017-11-03 ENCOUNTER — Telehealth: Payer: Self-pay

## 2017-11-03 ENCOUNTER — Encounter (HOSPITAL_COMMUNITY): Payer: Self-pay | Admitting: Internal Medicine

## 2017-11-03 NOTE — Telephone Encounter (Signed)
-----   Message from Milus Banister, MD sent at 11/03/2017  8:43 AM EST ----- Dr. Sherrine Maples,  I do think upper EUS will be helpful for local staging information.  If you can give him a heads up about that plan we will contact him to schedule.  Has he been referred to surgery?  Todd [name], See above, he needs upper EUS radial +/- linear for next available MAC Thursday for gastric cancer staging.  Thanks  dJ  ----- Message ----- From: Creola Corn, MD Sent: 11/02/2017   3:52 PM To: Milus Banister, MD  This is a gentleman with diffuse gastric cancer- signet cell type, needing a gastrectomy. PET/negative. I am not sure if he needs an EUS , and I dont know if there would be any role for this in diffuse gastric, but I wondered what your thought are.  Thanks [phone]

## 2017-11-03 NOTE — Progress Notes (Signed)
Faxed referral/pt records to [facility] / Dr. Barry Dienes [phone].

## 2017-11-04 ENCOUNTER — Other Ambulatory Visit: Payer: Self-pay

## 2017-11-04 DIAGNOSIS — C169 Malignant neoplasm of stomach, unspecified: Secondary | ICD-10-CM

## 2017-11-04 NOTE — Telephone Encounter (Signed)
EUS scheduled for 11/24/17 at 130 pm

## 2017-11-07 NOTE — Telephone Encounter (Signed)
Left message on machine to call back  

## 2017-11-07 NOTE — Telephone Encounter (Signed)
EUS scheduled, pt instructed and medications reviewed.  Patient instructions mailed to home.  Patient to call with any questions or concerns.  

## 2017-11-10 ENCOUNTER — Other Ambulatory Visit (HOSPITAL_COMMUNITY): Payer: Self-pay

## 2017-11-10 ENCOUNTER — Encounter (HOSPITAL_COMMUNITY): Payer: Self-pay | Admitting: Genetic Counselor

## 2017-11-11 ENCOUNTER — Other Ambulatory Visit: Payer: Self-pay | Admitting: Cardiology

## 2017-11-11 DIAGNOSIS — C169 Malignant neoplasm of stomach, unspecified: Secondary | ICD-10-CM | POA: Insufficient documentation

## 2017-11-15 DIAGNOSIS — C163 Malignant neoplasm of pyloric antrum: Secondary | ICD-10-CM | POA: Diagnosis not present

## 2017-11-16 ENCOUNTER — Other Ambulatory Visit: Payer: Self-pay

## 2017-11-16 ENCOUNTER — Encounter (HOSPITAL_COMMUNITY): Payer: Self-pay | Admitting: Emergency Medicine

## 2017-11-24 ENCOUNTER — Encounter (HOSPITAL_COMMUNITY): Payer: Self-pay | Admitting: Emergency Medicine

## 2017-11-24 ENCOUNTER — Ambulatory Visit (HOSPITAL_COMMUNITY): Payer: Medicare Other | Admitting: Anesthesiology

## 2017-11-24 ENCOUNTER — Encounter (HOSPITAL_COMMUNITY)
Admission: RE | Disposition: A | Discharge status: Home / Self Care | Payer: Self-pay | Source: Ambulatory Visit | Attending: Gastroenterology

## 2017-11-24 ENCOUNTER — Other Ambulatory Visit: Payer: Self-pay

## 2017-11-24 ENCOUNTER — Ambulatory Visit (HOSPITAL_COMMUNITY)
Admission: RE | Admit: 2017-11-24 | Discharge: 2017-11-24 | Disposition: A | Discharge status: Home / Self Care | Payer: Medicare Other | Source: Ambulatory Visit | Attending: Gastroenterology | Admitting: Gastroenterology

## 2017-11-24 DIAGNOSIS — C169 Malignant neoplasm of stomach, unspecified: Secondary | ICD-10-CM

## 2017-11-24 DIAGNOSIS — K219 Gastro-esophageal reflux disease without esophagitis: Secondary | ICD-10-CM | POA: Diagnosis not present

## 2017-11-24 DIAGNOSIS — C162 Malignant neoplasm of body of stomach: Secondary | ICD-10-CM | POA: Insufficient documentation

## 2017-11-24 DIAGNOSIS — I714 Abdominal aortic aneurysm, without rupture: Secondary | ICD-10-CM | POA: Diagnosis not present

## 2017-11-24 DIAGNOSIS — I2510 Atherosclerotic heart disease of native coronary artery without angina pectoris: Secondary | ICD-10-CM | POA: Diagnosis not present

## 2017-11-24 DIAGNOSIS — N182 Chronic kidney disease, stage 2 (mild): Secondary | ICD-10-CM | POA: Diagnosis not present

## 2017-11-24 DIAGNOSIS — I129 Hypertensive chronic kidney disease with stage 1 through stage 4 chronic kidney disease, or unspecified chronic kidney disease: Secondary | ICD-10-CM | POA: Diagnosis not present

## 2017-11-24 HISTORY — PX: EUS: SHX5427

## 2017-11-24 SURGERY — UPPER ENDOSCOPIC ULTRASOUND (EUS) RADIAL
Anesthesia: Monitor Anesthesia Care

## 2017-11-24 MED ORDER — PROPOFOL 10 MG/ML IV BOLUS
INTRAVENOUS | Status: AC
  Filled 2017-11-24: qty 40

## 2017-11-24 MED ORDER — PROPOFOL 500 MG/50ML IV EMUL
INTRAVENOUS | Status: DC | PRN
  Administered 2017-11-24: 150 ug/kg/min via INTRAVENOUS

## 2017-11-24 MED ORDER — LACTATED RINGERS IV SOLN
INTRAVENOUS | Status: DC
  Administered 2017-11-24: 12:00:00 via INTRAVENOUS

## 2017-11-24 MED ORDER — ONDANSETRON HCL 4 MG/2ML IJ SOLN
INTRAMUSCULAR | Status: DC | PRN
  Administered 2017-11-24: 4 mg via INTRAVENOUS

## 2017-11-24 MED ORDER — PROPOFOL 500 MG/50ML IV EMUL
INTRAVENOUS | Status: DC | PRN
  Administered 2017-11-24: 20 mg via INTRAVENOUS
  Administered 2017-11-24: 50 mg via INTRAVENOUS
  Administered 2017-11-24: 20 mg via INTRAVENOUS

## 2017-11-24 MED ORDER — SODIUM CHLORIDE 0.9 % IV SOLN: INTRAVENOUS | Status: DC

## 2017-11-24 NOTE — Interval H&P Note (Signed)
History and Physical Interval Note:  11/24/2017 1:09 PM  Todd [name]  has presented today for surgery, with the diagnosis of gastric cancer staging  The various methods of treatment have been discussed with the patient and family. After consideration of risks, benefits and other options for treatment, the patient has consented to  Procedure(s): UPPER ENDOSCOPIC ULTRASOUND (EUS) RADIAL (N/A) as a surgical intervention .  The patient's history has been reviewed, patient examined, no change in status, stable for surgery.  I have reviewed the patient's chart and labs.  Questions were answered to the patient's satisfaction.     Milus Banister

## 2017-11-24 NOTE — Anesthesia Procedure Notes (Signed)
Date/Time: 11/24/2017 1:59 PM Performed by: Glory Buff, CRNA Oxygen Delivery Method: Nasal cannula

## 2017-11-24 NOTE — Anesthesia Preprocedure Evaluation (Addendum)
Anesthesia Evaluation  Patient identified by MRN, date of birth, ID band Patient awake    Reviewed: Allergy & Precautions, NPO status , Patient's Chart, lab work & pertinent test results  Airway Mallampati: II  TM Distance: >3 FB Neck ROM: Full    Dental  (+) Edentulous Upper, Missing   Pulmonary former smoker,    breath sounds clear to auscultation       Cardiovascular hypertension, Pt. on medications + CAD and + Peripheral Vascular Disease ( a. bilateral superficial femoral artery stents in May/August of 2012. b. 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), Residual Symptoms    GI/Hepatic GERD  ,  Endo/Other    Renal/GU Renal disease     Musculoskeletal   Abdominal   Peds  Hematology   Anesthesia Other Findings   Reproductive/Obstetrics                             Anesthesia Physical  Anesthesia Plan  ASA: III  Anesthesia Plan: MAC   Post-op Pain Management:    Induction: Intravenous  PONV Risk Score and Plan: 1 and Treatment may vary due to age or medical condition  Airway Management Planned: Simple Face Mask  Additional Equipment:   Intra-op Plan:   Post-operative Plan:   Informed Consent: I have reviewed the patients History and Physical, chart, labs and discussed the procedure including the risks, benefits and alternatives for the proposed anesthesia with the patient or authorized representative who has indicated his/her understanding and acceptance.   Dental advisory given  Plan Discussed with: CRNA  Anesthesia Plan Comments:         Anesthesia Quick Evaluation

## 2017-11-24 NOTE — Transfer of Care (Signed)
Immediate Anesthesia Transfer of Care Note  Patient: Todd [name]  Procedure(s) Performed: UPPER ENDOSCOPIC ULTRASOUND (EUS) RADIAL (N/A )  Patient Location: PACU  Anesthesia Type:MAC  Level of Consciousness: awake, alert  and oriented  Airway & Oxygen Therapy: Patient Spontanous Breathing and Patient connected to nasal cannula oxygen  Post-op Assessment: Report given to RN and Post -op Vital signs reviewed and stable  Post vital signs: Reviewed and stable  Last Vitals:  Vitals:   11/24/17 1131  BP: (!) 131/91  Pulse: 76  Resp: 17  Temp: 36.9 C  SpO2: 99%    Last Pain:  Vitals:   11/24/17 1131  TempSrc: Oral         Complications: No apparent anesthesia complications

## 2017-11-24 NOTE — Discharge Instructions (Signed)
Esophagogastroduodenoscopy, Care After °Refer to this sheet in the next few weeks. These instructions provide you with information about caring for yourself after your procedure. Your health care provider may also give you more specific instructions. Your treatment has been planned according to current medical practices, but problems sometimes occur. Call your health care provider if you have any problems or questions after your procedure. °What can I expect after the procedure? °After the procedure, it is common to have: °· A sore throat. °· Nausea. °· Bloating. °· Dizziness. °· Fatigue. ° °Follow these instructions at home: °· Do not eat or drink anything until the numbing medicine (local anesthetic) has worn off and your gag reflex has returned. You will know that the local anesthetic has worn off when you can swallow comfortably. °· Do not drive for 24 hours if you received a medicine to help you relax (sedative). °· If your health care provider took a tissue sample for testing during the procedure, make sure to get your test results. This is your responsibility. Ask your health care provider or the department performing the test when your results will be ready. °· Keep all follow-up visits as told by your health care provider. This is important. °Contact a health care provider if: °· You cannot stop coughing. °· You are not urinating. °· You are urinating less than usual. °Get help right away if: °· You have trouble swallowing. °· You cannot eat or drink. °· You have throat or chest pain that gets worse. °· You are dizzy or light-headed. °· You faint. °· You have nausea or vomiting. °· You have chills. °· You have a fever. °· You have severe abdominal pain. °· You have black, tarry, or bloody stools. °This information is not intended to replace advice given to you by your health care provider. Make sure you discuss any questions you have with your health care provider. °Document Released: 08/30/2012 Document  Revised: 02/19/2016 Document Reviewed: 08/07/2015 °Elsevier Interactive Patient Education © 2018 [employer] ° °

## 2017-11-24 NOTE — Anesthesia Postprocedure Evaluation (Signed)
Anesthesia Post Note  Patient: Todd [name]  Procedure(s) Performed: UPPER ENDOSCOPIC ULTRASOUND (EUS) RADIAL (N/A )     Patient location during evaluation: PACU Anesthesia Type: MAC Level of consciousness: awake and alert Pain management: pain level controlled Vital Signs Assessment: post-procedure vital signs reviewed and stable Respiratory status: spontaneous breathing, nonlabored ventilation, respiratory function stable and patient connected to nasal cannula oxygen Cardiovascular status: stable and blood pressure returned to baseline Postop Assessment: no apparent nausea or vomiting Anesthetic complications: no    Last Vitals:  Vitals:   11/24/17 1430 11/24/17 1440  BP: 122/74 (!) 141/94  Pulse: 75 71  Resp: (!) 21 15  Temp:    SpO2: 100% 99%    Last Pain:  Vitals:   11/24/17 1428  TempSrc: Oral                 [name]

## 2017-11-24 NOTE — Op Note (Signed)
[facility] Patient Name: Todd [name] Procedure Date: 11/24/2017 MRN: [id number] Attending MD: Rachael Fee , MD Date of Birth: [birth year] CSN: [id number] Age: 57 Admit Type: Outpatient Procedure:                Upper EUS Indications:              Staging for recently diagnosed gastric cancer by                            EGD Dr. Jena Gauss: "POORLY DIFFERENTIATED                            ADENOCARCINOMA WITH SIGNET RING CELL FEATURES" on                            path Providers:                Rachael Fee, MD, Dwain Sarna, RN, Arlee Muslim Tech., Technician, Verita Schneiders,                            Technician, Roni Bread, CRNA Referring MD:             Towana Badger, MD Medicines:                Monitored Anesthesia Care Complications:            No immediate complications. Estimated blood loss:                            None. Estimated Blood Loss:     Estimated blood loss: none. Procedure:                Pre-Anesthesia Assessment:                           - Prior to the procedure, a History and Physical                            was performed, and patient medications and                            allergies were reviewed. The patient's tolerance of                            previous anesthesia was also reviewed. The risks                            and benefits of the procedure and the sedation                            options and risks were discussed with the patient.                            All questions were answered, and  informed consent                            was obtained. Prior Anticoagulants: The patient has                            taken no previous anticoagulant or antiplatelet                            agents. ASA Grade Assessment: II - A patient with                            mild systemic disease. After reviewing the risks                            and benefits, the patient was deemed  in                            satisfactory condition to undergo the procedure.                           After obtaining informed consent, the endoscope was                            passed under direct vision. Throughout the                            procedure, the patient's blood pressure, pulse, and                            oxygen saturations were monitored continuously. The                            [id number] ([id number]) scope was introduced through                            the mouth, and advanced to the second part of                            duodenum. The upper EUS was accomplished without                            difficulty. The patient tolerated the procedure                            well. Scope In: Scope Out: Findings:      Endoscopic Finding :      1. Normal esophagus      2. Ulcerated, clearly malignant mass involving the lesser curvature of       the stomach, approximately mid body. The mass is 5cm across, 5cm in       length. The distal most aspect of the mass is 7-8cm from the pylorus.       The proximal most aspect of the mass is 5-6cm from the GE junction.  Endosonographic Finding :      1. The mass above correlates with a hypoechoic lesion that clearly       passes into and through the muscularis propria layer of the gastric wall       and also appears to invade the serosa (uT4a). The mass is 1.6cm in       maximal thickness      2. No perigastric adenopathy. Impression:               - 5cm long, 5cm across, 1.6cm deep [id number] gastric                            adenocarcinoma involving the mid body along the                            lesser curvature. Moderate Sedation:      N/A- Per Anesthesia Care Recommendation:           - Discharge patient to home (ambulatory). Procedure Code(s):        --- Professional ---                           [phone], Esophagogastroduodenoscopy, flexible,                            transoral; with endoscopic ultrasound  examination                            limited to the esophagus, stomach or duodenum, and                            adjacent structures Diagnosis Code(s):        --- Professional ---                           K31.89, Other diseases of stomach and duodenum CPT copyright 2016 American Medical Association. All rights reserved. The codes documented in this report are preliminary and upon coder review may  be revised to meet current compliance requirements. Rachael Fee, MD 11/24/2017 2:31:15 PM This report has been signed electronically. Number of Addenda: 0

## 2017-11-28 ENCOUNTER — Ambulatory Visit (INDEPENDENT_AMBULATORY_CARE_PROVIDER_SITE_OTHER): Payer: Medicare Other | Admitting: Nurse Practitioner

## 2017-11-28 ENCOUNTER — Encounter: Payer: Self-pay | Admitting: Nurse Practitioner

## 2017-11-28 VITALS — BP 126/88 | HR 76 | Temp 97.6°F | Ht 73.0 in | Wt 203.0 lb

## 2017-11-28 DIAGNOSIS — K219 Gastro-esophageal reflux disease without esophagitis: Secondary | ICD-10-CM

## 2017-11-28 DIAGNOSIS — K589 Irritable bowel syndrome without diarrhea: Secondary | ICD-10-CM | POA: Insufficient documentation

## 2017-11-28 DIAGNOSIS — K581 Irritable bowel syndrome with constipation: Secondary | ICD-10-CM | POA: Diagnosis not present

## 2017-11-28 NOTE — Assessment & Plan Note (Signed)
Tums doing well.  Continue Protonix 40 mg daily.  Follow-up in 6 months.

## 2017-11-28 NOTE — Assessment & Plan Note (Signed)
After the patient's full description of his symptoms, it seems he has lower abdominal pain/rectal pain when he has to have a bowel movement.  After having a bowel movement his pain is resolved.  He typically has about 2 bowel movements a day.  His stools however are sometimes hard and require straining.  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  We will start him on Amitiza 8 mcg twice a day.  We will give samples for 2 weeks and request a progress report in 1-2 weeks.  Hold fiber supplements while taking Amitiza, at least initially.  Follow-up in 6 months to allow "uncluttered" follow-up with oncology and surgery related to gastric adenocarcinoma.

## 2017-11-28 NOTE — Patient Instructions (Signed)
1. I am giving you samples of Amitiza 8 mcg.  Take this twice a day, after eating a full meal (on a full stomach). 2. I am giving you samples to last 1 or 2 weeks. 3. Call us in 1-2 weeks and let us know if it is helping your pain with bowel movement as well as your occasionally hard stools. 4. Continue your other medications including Protonix 40 mg daily. 5. Return for follow-up in 6 months.  Best of Luck with your follow-up with Oncology and Surgery!!!

## 2017-11-28 NOTE — Progress Notes (Signed)
Referring Provider: Lavella Lemons, PA Primary Care Physician:  Lavella Lemons, PA Primary GI:  Dr. Gala Romney  Chief Complaint  Patient presents with  . Constipation    f/u. Painful when he has BM  . Gastroesophageal Reflux    f/u. Doing okay now    HPI:   Todd [name] is a 57 y.o. male who presents for follow-up on GERD and constipation.  At his last visit he was doing overall well.  Previously having dysphasia with eating fish and had a fishbone stuck in his throat.  Denied abdominal pain and GERD symptoms.  Noted a lot of vomiting, esophageal burning worse at night.  Also some constipation with lower abdominal pain when he needs to defecate with having bowel movements as frequently as once daily but accompanied by straining and hard stools.  No other GI symptoms.  Last colonoscopy previously completed 2006 and could not remember the results.  Commended Protonix 40 mg daily, start fiber supplement, colonoscopy and endoscopy, follow-up in 3 months.  Colonoscopy and endoscopy were completed 10/06/2017.  Colonoscopy found diverticulosis in the sigmoid colon, four 4-6 mm polyps at the splenic flexure and descending colon, otherwise normal.  Surgical pathology found the polyps to be tubular adenoma. EGD completed the same day found mild erosive esophagitis, small hiatal hernia, markedly abnormal appearing gastric mucosa with overlying ulceration concerning for infiltrating process status post biopsy.  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  Recommended 5-year repeat colonoscopy.  Recommended ASAP referral to oncology.  EUS completed in [facility] 11/24/2017 for staging of gastric cancer.  Findings include 5 cm long, 5 cm across, 1.6 cm deep gastric adenocarcinoma involving the mid body along the lesser curvature.  The patient has been seen by oncology.  PET scan results showed hypermetabolism along the lesser curvature of  the antrum of the stomach corresponding to recent findings.  Abdominal aortic aneurysm also found measuring 3.9 x 3.7 cm and recommended ultrasound follow-up in 2 years.  Recommended EUS, MRI of the brain, consideration of gastrectomy by surgery.  He is scheduled to follow-up with oncology around mid March.  Today he states he's doing ok overall. Denies constipation. Has pain in his rectum when he has to have a bowel movement. Stools tend to be hard/straining, has a bowel movement about twice a day. Pain resolves with bowel movement. Denies abdominal pain, N/V, hematochezia, melena, fever, chills. Admits subjective weight loss of about 6 lbs over the past 2 weeks. Objectively he was 198 lb 3 months ago and today he is 203 lb. Denies any further GERD symptoms now that he's on Protonix. Denies chest pain, dyspnea, dizziness, lightheadedness, syncope, near syncope. Denies any other upper or lower GI symptoms.  No appointment scheduled yet with surgeon, but was told they'd want to do surgery. Still   Past Medical History:  Diagnosis Date  . CAD (coronary artery disease)    a. Cath 07/2012 following abnormal nuclear stress test: 20-30% LAD, 20-30% OM1, occluded branch off OM2 that fills slowly via collaterals, small nondominant RCA occluded proximally, fills slowly via collaterals.  . CKD (chronic kidney disease), stage II   . Gastric adenocarcinoma ([place])   . Hyperlipidemia   . Hypertension   . Peripheral vascular disease ([place])    a. bilateral superficial femoral artery stents in May/August of 2012. b. Left femoral to above-knee popliteal bypass in 08/2012.  Marland Kitchen Renal insufficiency   . Stroke [facility])  2008 right side weakness  . Tobacco abuse     Past Surgical History:  Procedure Laterality Date  . ABDOMINAL AORTAGRAM N/A 04/27/2012   Procedure: ABDOMINAL Maxcine Ham;  Surgeon: Conrad Golden Beach, MD;  Location: [facility] CATH LAB;  Service: Cardiovascular;  Laterality: N/A;  . ABDOMINAL AORTAGRAM N/A 08/09/2014     Procedure: ABDOMINAL Maxcine Ham;  Surgeon: Elam Dutch, MD;  Location[facility] CATH LAB;  Service: Cardiovascular;  Laterality: N/A;  . BIOPSY  10/06/2017   Procedure: BIOPSY;  Surgeon: Daneil Dolin, MD;  [facility];  Service: Endoscopy;;  gastric   . CARDIAC CATHETERIZATION    . COLONOSCOPY    . COLONOSCOPY WITH PROPOFOL N/A 10/06/2017   Procedure: COLONOSCOPY WITH PROPOFOL;  Surgeon: Daneil Dolin, MD;  [facility];  Service: Endoscopy;  Laterality: N/A;  1:45pm - patient can't come earlier due to transportation  . ESOPHAGOGASTRODUODENOSCOPY (EGD) WITH PROPOFOL N/A 10/06/2017   Procedure: ESOPHAGOGASTRODUODENOSCOPY (EGD) WITH PROPOFOL;  Surgeon: Daneil Dolin, MD;  [facility];  Service: Endoscopy;  Laterality: N/A;  . EUS N/A 11/24/2017   Procedure: UPPER ENDOSCOPIC ULTRASOUND (EUS) RADIAL;  Surgeon: Milus Banister, MD;  [facility] ENDOSCOPY;  Service: Endoscopy;  Laterality: N/A;  . FEMORAL ARTERY - POPLITEAL ARTERY BYPASS GRAFT    . FEMORAL-POPLITEAL BYPASS GRAFT  09/26/2012   Procedure: BYPASS GRAFT FEMORAL-POPLITEAL ARTERY;  Surgeon: Elam Dutch, MD;  Location: [facility] OR;  Service: Vascular;  Laterality: Left;  using 61mm Gore-Tex Propaten Ringed Graft, Vein patch on the proximal and distal anastomosis  . FEMORAL-POPLITEAL BYPASS GRAFT Right 08/12/2014   Procedure:  FEMORAL-POPLITEAL ARTERY BYPASS WITH SAPHENOUS VEIN GRAFT, INTRAOPERATIVE ARTERIOGRAM;  Surgeon: Elam Dutch, MD;  Location: [place];  Service: Vascular;  Laterality: Right;  . INTRAOPERATIVE ARTERIOGRAM  08/12/2014   Procedure: INTRA OPERATIVE ARTERIOGRAM;  Surgeon: Elam Dutch, MD;  Location: [place];  Service: Vascular;;  . POLYPECTOMY  10/06/2017   Procedure: POLYPECTOMY;  Surgeon: Daneil Dolin, MD;  [facility];  Service: Endoscopy;;  splenic flexure,  ascending  . PR VEIN BYPASS GRAFT,AORTO-FEM-POP    . stents in bil legs      Current Outpatient  Medications  Medication Sig Dispense Refill  . amLODipine (NORVASC) 10 MG tablet TAKE ONE TABLET BY MOUTH ONCE DAILY 90 tablet 3  . aspirin EC 81 MG tablet Take 81 mg by mouth daily.    Marland Kitchen lisinopril (PRINIVIL,ZESTRIL) 40 MG tablet Take 40 mg by mouth at bedtime.    . pantoprazole (PROTONIX) 40 MG tablet Take 1 tablet (40 mg total) by mouth 2 (two) times daily before a meal. 60 tablet 3  . rosuvastatin (CRESTOR) 10 MG tablet TAKE ONE TABLET BY MOUTH ONCE DAILY (Patient taking differently: TAKE 10 MG BY MOUTH ONCE DAILY AT BEDTIME) 90 tablet 3  . valACYclovir (VALTREX) 500 MG tablet Take 500 mg by mouth at bedtime.     No current facility-administered medications for this visit.     Allergies as of 11/28/2017 - Review Complete 11/28/2017  Allergen Reaction Noted  . Potassium-containing compounds  10/06/2017    Family History  Problem Relation Age of Onset  . Diabetes Mother   . Heart disease Mother        before age 65- Triple BPG  . Cancer Mother        Luekemia  . Deep vein thrombosis Mother   . Hyperlipidemia Mother   . Hypertension Mother   .  Heart attack Mother   . Peripheral vascular disease Mother   . Stroke Mother        x 3  . Pancreatic cancer Mother   . Arthritis Father   . Colon cancer Neg Hx   . Gastric cancer Neg Hx   . Esophageal cancer Neg Hx     Social History   Socioeconomic History  . Marital status: Legally Separated    Spouse name: None  . Number of children: None  . Years of education: None  . Highest education level: None  Social Needs  . Financial resource strain: None  . Food insecurity - worry: None  . Food insecurity - inability: None  . Transportation needs - medical: None  . Transportation needs - non-medical: None  Occupational History  . None  Tobacco Use  . Smoking status: Former Smoker    Packs/day: 1.00    Years: 25.00    Pack years: 25.00    Types: Cigarettes    Start date: 11/16/1974    Last attempt to quit: 02/25/2017     Years since quitting: 0.7  . Smokeless tobacco: Never Used  Substance and Sexual Activity  . Alcohol use: No    Alcohol/week: 0.0 oz  . Drug use: Yes    Types: Marijuana    Comment: occ- last used 2016  . Sexual activity: Not Currently    Partners: Female  Other Topics Concern  . None  Social History Narrative  . None    Review of Systems: General: Negative for anorexia, weight loss, fever, chills, fatigue, weakness. ENT: Negative for hoarseness, difficulty swallowing. CV: Negative for chest pain, angina, palpitations, peripheral edema.  Respiratory: Negative for dyspnea at rest, cough, sputum, wheezing.  GI: See history of present illness. Endo: Negative for unusual weight change.  Heme: Negative for bruising or bleeding. Allergy: Negative for rash or hives.   Physical Exam: BP 126/88   Pulse 76   Temp 97.6 F (36.4 C) (Oral)   Ht 6\' 1"  (1.854 m)   Wt 203 lb (92.1 kg)   BMI 26.78 kg/m  General:   Alert and oriented. Pleasant and cooperative. Well-nourished and well-developed.  Eyes:  Without icterus, sclera clear and conjunctiva pink.  Ears:  Normal auditory acuity. Cardiovascular:  S1, S2 present without murmurs appreciated. Extremities without clubbing or edema. Respiratory:  Clear to auscultation bilaterally. No wheezes, rales, or rhonchi. No distress.  Gastrointestinal:  +BS, soft, non-tender and non-distended. No HSM noted. No guarding or rebound. No masses appreciated.  Rectal:  Deferred  Musculoskalatal:  Symmetrical without gross deformities. Normal posture. Neurologic:  Alert and oriented x4;  grossly normal neurologically. Psych:  Alert and cooperative. Normal mood and affect. Heme/Lymph/Immune: No excessive bruising noted.    11/28/2017 10:04 AM   Disclaimer: This note was dictated with voice recognition software. Similar sounding words can inadvertently be transcribed and may not be corrected upon review.

## 2017-11-29 NOTE — Progress Notes (Signed)
cc'ed to pcp °

## 2017-12-05 ENCOUNTER — Telehealth: Payer: Self-pay

## 2017-12-05 MED ORDER — LUBIPROSTONE 8 MCG PO CAPS: 8.0000 ug | ORAL_CAPSULE | Freq: Two times a day (BID) | ORAL | 11 refills | Status: DC

## 2017-12-05 NOTE — Addendum Note (Signed)
Addended by: Mahala Menghini on: 12/05/2017 08:46 PM   Modules accepted: Orders

## 2017-12-05 NOTE — Telephone Encounter (Signed)
Pt walked in the office for samples of Amitiza 56mcg. Pt also would like Amitiza 8 mcg called into his pharmacy. Pt is tolerating medication well.

## 2017-12-05 NOTE — Telephone Encounter (Signed)
done

## 2017-12-08 ENCOUNTER — Inpatient Hospital Stay (HOSPITAL_COMMUNITY): Payer: Medicare Other | Attending: Internal Medicine | Admitting: Genetic Counselor

## 2017-12-08 ENCOUNTER — Encounter (HOSPITAL_COMMUNITY): Payer: Self-pay | Admitting: Genetic Counselor

## 2017-12-08 ENCOUNTER — Inpatient Hospital Stay (HOSPITAL_COMMUNITY): Payer: Medicare Other

## 2017-12-08 DIAGNOSIS — Z809 Family history of malignant neoplasm, unspecified: Secondary | ICD-10-CM | POA: Insufficient documentation

## 2017-12-08 DIAGNOSIS — Z79899 Other long term (current) drug therapy: Secondary | ICD-10-CM | POA: Insufficient documentation

## 2017-12-08 DIAGNOSIS — I129 Hypertensive chronic kidney disease with stage 1 through stage 4 chronic kidney disease, or unspecified chronic kidney disease: Secondary | ICD-10-CM | POA: Insufficient documentation

## 2017-12-08 DIAGNOSIS — C169 Malignant neoplasm of stomach, unspecified: Secondary | ICD-10-CM | POA: Insufficient documentation

## 2017-12-08 DIAGNOSIS — N182 Chronic kidney disease, stage 2 (mild): Secondary | ICD-10-CM | POA: Insufficient documentation

## 2017-12-08 DIAGNOSIS — I70219 Atherosclerosis of native arteries of extremities with intermittent claudication, unspecified extremity: Secondary | ICD-10-CM | POA: Insufficient documentation

## 2017-12-08 DIAGNOSIS — Z800 Family history of malignant neoplasm of digestive organs: Secondary | ICD-10-CM | POA: Insufficient documentation

## 2017-12-08 DIAGNOSIS — Z7982 Long term (current) use of aspirin: Secondary | ICD-10-CM | POA: Insufficient documentation

## 2017-12-08 DIAGNOSIS — F17290 Nicotine dependence, other tobacco product, uncomplicated: Secondary | ICD-10-CM | POA: Insufficient documentation

## 2017-12-08 NOTE — Progress Notes (Signed)
REFERRING PROVIDER: Creola Corn, MD [place], [place] [address]  PRIMARY PROVIDER:  Lavella Lemons, PA  PRIMARY REASON FOR VISIT:  1. Malignant neoplasm of stomach, unspecified location ([place])   2. Family history of cancer      HISTORY OF PRESENT ILLNESS:   Todd [name], a 58 y.o. male, was seen for a [place] cancer genetics consultation at the request of Dr. Sherrine Maples due to a personal and family history of cancer.  Todd [name] presents to clinic today to discuss the possibility of a hereditary predisposition to cancer, genetic testing, and to further clarify his future cancer risks, as well as potential cancer risks for family members.   In January 2019, at the age of 80, Todd [name] was diagnosed with diffuse cancer of the stomach. This is the first time he has been diagnosed with cancer.  Marland Kitchen    CANCER HISTORY:   No history exists.     RISK FACTORS:  Dermatology: Does not see a dermatologist Colonoscopy: yes; 2 tubular adenomas.    Past Medical History:  Diagnosis Date  . CAD (coronary artery disease)    a. Cath 07/2012 following abnormal nuclear stress test: 20-30% LAD, 20-30% OM1, occluded branch off OM2 that fills slowly via collaterals, small nondominant RCA occluded proximally, fills slowly via collaterals.  . CKD (chronic kidney disease), stage II   . Family history of cancer   . Gastric adenocarcinoma ([place])   . Hyperlipidemia   . Hypertension   . Peripheral vascular disease ([place])    a. bilateral superficial femoral artery stents in May/August of 2012. b. Left femoral to above-knee popliteal bypass in 08/2012.  Marland Kitchen Renal insufficiency   . Stroke [facility])    2008 right side weakness  . Tobacco abuse     Past Surgical History:  Procedure Laterality Date  . ABDOMINAL AORTAGRAM N/A 04/27/2012   Procedure: ABDOMINAL Maxcine Ham;  Surgeon: Conrad De Queen, MD;  Location: [facility] CATH LAB;  Service: Cardiovascular;  Laterality: N/A;  . ABDOMINAL AORTAGRAM N/A 08/09/2014    Procedure: ABDOMINAL Maxcine Ham;  Surgeon: Elam Dutch, MD;  Location: [facility] CATH LAB;  Service: Cardiovascular;  Laterality: N/A;  . BIOPSY  10/06/2017   Procedure: BIOPSY;  Surgeon: Daneil Dolin, MD;  [facility];  Service: Endoscopy;;  gastric   . CARDIAC CATHETERIZATION    . COLONOSCOPY    . COLONOSCOPY WITH PROPOFOL N/A 10/06/2017   Procedure: COLONOSCOPY WITH PROPOFOL;  Surgeon: Daneil Dolin, MD;  [facility];  Service: Endoscopy;  Laterality: N/A;  1:45pm - patient can't come earlier due to transportation  . ESOPHAGOGASTRODUODENOSCOPY (EGD) WITH PROPOFOL N/A 10/06/2017   Procedure: ESOPHAGOGASTRODUODENOSCOPY (EGD) WITH PROPOFOL;  Surgeon: Daneil Dolin, MD;  [facility];  Service: Endoscopy;  Laterality: N/A;  . EUS N/A 11/24/2017   Procedure: UPPER ENDOSCOPIC ULTRASOUND (EUS) RADIAL;  Surgeon: Milus Banister, MD;  [facility];  Service: Endoscopy;  Laterality: N/A;  . FEMORAL ARTERY - POPLITEAL ARTERY BYPASS GRAFT    . FEMORAL-POPLITEAL BYPASS GRAFT  09/26/2012   Procedure: BYPASS GRAFT FEMORAL-POPLITEAL ARTERY;  Surgeon: Elam Dutch, MD;  Location: [facility] OR;  Service: Vascular;  Laterality: Left;  using 76m Gore-Tex Propaten Ringed Graft, Vein patch on the proximal and distal anastomosis  . FEMORAL-POPLITEAL BYPASS GRAFT Right 08/12/2014   Procedure:  FEMORAL-POPLITEAL ARTERY BYPASS WITH SAPHENOUS VEIN GRAFT, INTRAOPERATIVE ARTERIOGRAM;  Surgeon: CElam Dutch MD;  Location[place]  Service: Vascular;  Laterality: Right;  . INTRAOPERATIVE ARTERIOGRAM  08/12/2014   Procedure: INTRA OPERATIVE ARTERIOGRAM;  Surgeon: Elam Dutch, MD;  Location: [place];  Service: Vascular;;  . POLYPECTOMY  10/06/2017   Procedure: POLYPECTOMY;  Surgeon: Daneil Dolin, MD;  [facility];  Service: Endoscopy;;  splenic flexure,  ascending  . PR VEIN BYPASS GRAFT,AORTO-FEM-POP    . stents in bil legs      Social History    Socioeconomic History  . Marital status: Legally Separated    Spouse name: Not on file  . Number of children: Not on file  . Years of education: Not on file  . Highest education level: Not on file  Social Needs  . Financial resource strain: Not on file  . Food insecurity - worry: Not on file  . Food insecurity - inability: Not on file  . Transportation needs - medical: Not on file  . Transportation needs - non-medical: Not on file  Occupational History  . Not on file  Tobacco Use  . Smoking status: Former Smoker    Packs/day: 1.00    Years: 25.00    Pack years: 25.00    Types: Cigarettes    Start date: 11/16/1974    Last attempt to quit: 02/25/2017    Years since quitting: 0.7  . Smokeless tobacco: Never Used  Substance and Sexual Activity  . Alcohol use: No    Alcohol/week: 0.0 oz  . Drug use: Yes    Types: Marijuana    Comment: occ- last used 2016  . Sexual activity: Not Currently    Partners: Female  Other Topics Concern  . Not on file  Social History Narrative  . Not on file     FAMILY HISTORY:  We obtained a detailed, 4-generation family history.  Significant diagnoses are listed below: Family History  Problem Relation Age of Onset  . Diabetes Mother   . Heart disease Mother        before age 27- Triple BPG  . Cancer Mother        Luekemia  . Deep vein thrombosis Mother   . Hyperlipidemia Mother   . Hypertension Mother   . Heart attack Mother   . Peripheral vascular disease Mother   . Stroke Mother        x 3  . Pancreatic cancer Mother   . Arthritis Father   . Hypertension Father   . Kidney disease Father   . Cancer Maternal Grandmother        NOS  . Diabetes Cousin   . Cancer Cousin        NOS, pat first cousin  . Cancer Cousin        NOS, pat first cousin  . Colon cancer Neg Hx   . Gastric cancer Neg Hx   . Esophageal cancer Neg Hx     The patient does not have children. He has three brothers and one sister.  One brother was killed in a  car accident.  The other siblings are living and cancer free.  His mother is deceased and his father is living.  The patient's mother was diagnosed with many health problems, but had leukemia and pancreatic cancer prior to her death at age 40.  She had two brothers and 1-2 sisters, who were all cancer free.  One maternal cousin has diabetes.  The maternal grandparents are deceased.  The patient' thinks his grandmother may have had cancer, but does not know what type.  The patient's father is one of 12-13 children.  The patient does not know of any cancer in his aunts or uncles. He has two male paternal cousins that have had cancer, but the patient does not know what type.  The paternal grandparents are both deceased.  Todd [name] is unaware of previous family history of genetic testing for hereditary cancer risks. Patient's maternal ancestors are of [place] and [place] American descent, and paternal ancestors are of African American descent. There is no reported Ashkenazi Jewish ancestry. There is no known consanguinity.  GENETIC COUNSELING ASSESSMENT: Todd [name] is a 57 y.o. male with a personal history of diffuse gastric cancer and a family history of cancer which is somewhat suggestive of a hereditary cancer syndrome and predisposition to cancer. We, therefore, discussed and recommended the following at today's visit.   DISCUSSION: We discussed that most gastric cancer is sporadic, but that about 3-5% of stomach cancer is hereditary.  There are different types of stomach cancer including adenocarcinoma's, GIST and diffuse type of cancers.  The patient has a diffuse stomach cancer which can be seen in patient's with a CDH1 hereditary mutation.  Individuals who have a hereditary mutation are placed at higher risk for cancer, and may need to be followed differently.  Additionally, their family members are at higher risk for having this same mutation and would need to consider testing.  We reviewed the  characteristics, features and inheritance patterns of hereditary cancer syndromes. We also discussed genetic testing, including the appropriate family members to test, the process of testing, insurance coverage and turn-around-time for results. We discussed the implications of a negative, positive and/or variant of uncertain significant result. We recommended Todd [name] pursue genetic testing for the common hereditary cancer gene panel. The Hereditary Gene Panel offered by [facility] includes sequencing and/or deletion duplication testing of the following 47 genes: APC, ATM, AXIN2, BARD1, BMPR1A, BRCA1, BRCA2, BRIP1, CDH1, CDK4, CDKN2A (p14ARF), CDKN2A (p16INK4a), CHEK2, CTNNA1, DICER1, EPCAM (Deletion/duplication testing only), GREM1 (promoter region deletion/duplication testing only), KIT, MEN1, MLH1, MSH2, MSH3, MSH6, MUTYH, NBN, NF1, NHTL1, PALB2, PDGFRA, PMS2, POLD1, POLE, PTEN, RAD50, RAD51C, RAD51D, SDHB, SDHC, SDHD, SMAD4, SMARCA4. STK11, TP53, TSC1, TSC2, and VHL.  The following genes were evaluated for sequence changes only: SDHA and HOXB13 c.251G>A variant only.   Based on Todd [name] personal and family history of cancer, he meets medical criteria for genetic testing. Despite that he meets criteria, he may still have an out of pocket cost. We discussed that if his out of pocket cost for testing is over $100, the laboratory will call and confirm whether he wants to proceed with testing.  If the out of pocket cost of testing is less than $100 he will be billed by the genetic testing laboratory.   PLAN: After considering the risks, benefits, and limitations, Todd [name]  provided informed consent to pursue genetic testing and the blood sample was sent to [facility] for analysis of the common hereditary cancer panel. Results should be available within approximately 2-3 weeks' time, at which point they will be disclosed by telephone to Todd [name], as will any additional recommendations warranted by these  results. Todd [name] will receive a summary of his genetic counseling visit and a copy of his results once available. This information will also be available in Epic. We encouraged Todd [name] to remain in contact with cancer genetics annually so that we can continuously update the family history and inform him of any changes in cancer genetics  and testing that may be of benefit for his family. Todd [name] questions were answered to his satisfaction today. Our contact information was provided should additional questions or concerns arise.  Lastly, we encouraged Todd [name] to remain in contact with cancer genetics annually so that we can continuously update the family history and inform him of any changes in cancer genetics and testing that may be of benefit for this family.   Mr.  [name] questions were answered to his satisfaction today. Our contact information was provided should additional questions or concerns arise. Thank you for the referral and allowing [place] to share in the care of your patient.   Karen P. Florene Glen, [place], [facility] Certified Genetic Counselor [email or web address] phone: [phone]  The patient was seen for a total of 45 minutes in face-to-face genetic counseling.  This patient was discussed with Drs. Magrinat, Lindi Adie and/or Burr Medico who agrees with the above.    _______________________________________________________________________ For Office Staff:  Number of people involved in session: 1 Was an Intern/ student involved with case: no

## 2017-12-15 ENCOUNTER — Encounter (HOSPITAL_COMMUNITY): Payer: Self-pay | Admitting: Internal Medicine

## 2017-12-15 ENCOUNTER — Inpatient Hospital Stay (HOSPITAL_BASED_OUTPATIENT_CLINIC_OR_DEPARTMENT_OTHER): Payer: Medicare Other | Admitting: Internal Medicine

## 2017-12-15 ENCOUNTER — Other Ambulatory Visit: Payer: Self-pay

## 2017-12-15 VITALS — BP 124/80 | HR 84 | Temp 98.5°F | Resp 20 | Wt 197.7 lb

## 2017-12-15 DIAGNOSIS — F17290 Nicotine dependence, other tobacco product, uncomplicated: Secondary | ICD-10-CM | POA: Diagnosis not present

## 2017-12-15 DIAGNOSIS — Z7982 Long term (current) use of aspirin: Secondary | ICD-10-CM

## 2017-12-15 DIAGNOSIS — C169 Malignant neoplasm of stomach, unspecified: Secondary | ICD-10-CM

## 2017-12-15 DIAGNOSIS — F17210 Nicotine dependence, cigarettes, uncomplicated: Secondary | ICD-10-CM | POA: Diagnosis not present

## 2017-12-15 DIAGNOSIS — I129 Hypertensive chronic kidney disease with stage 1 through stage 4 chronic kidney disease, or unspecified chronic kidney disease: Secondary | ICD-10-CM

## 2017-12-15 DIAGNOSIS — I70219 Atherosclerosis of native arteries of extremities with intermittent claudication, unspecified extremity: Secondary | ICD-10-CM

## 2017-12-15 DIAGNOSIS — Z800 Family history of malignant neoplasm of digestive organs: Secondary | ICD-10-CM

## 2017-12-15 DIAGNOSIS — Z79899 Other long term (current) drug therapy: Secondary | ICD-10-CM

## 2017-12-15 DIAGNOSIS — N182 Chronic kidney disease, stage 2 (mild): Secondary | ICD-10-CM | POA: Diagnosis not present

## 2017-12-15 NOTE — Patient Instructions (Addendum)
[place] at [facility] Discharge Instructions  You were seen today by Dr. Walden Field. She discussed chemo therapy and surgery. She discussed your scan results and the cancer that you have.  She discussed your options and what you want to do to treat your cancer. If you choose not to do any kind of treatment or surgery the likelihood of your cancer spreading is higher.  She would like for you to have a MRI of your brain, to evaluate your brain for any metastatic disease. We will refer you to [facility] for surgery consult and follow up on Endoscopic ultrasound. We will see you back in 3-4 weeks for labs and follow up and figure out your plan of care.    Thank you for choosing [place] at [facility] to provide your oncology and hematology care.  To afford each patient quality time with our provider, please arrive at least 15 minutes before your scheduled appointment time.   If you have a lab appointment with the [facility] please come in thru the  Main Entrance and check in at the main information desk  You need to re-schedule your appointment should you arrive 10 or more minutes late.  We strive to give you quality time with our providers, and arriving late affects you and other patients whose appointments are after yours.  Also, if you no show three or more times for appointments you may be dismissed from the clinic at the providers discretion.     Again, thank you for choosing [facility].  Our hope is that these requests will decrease the amount of time that you wait before being seen by our physicians.       _____________________________________________________________  Should you have questions after your visit to [facility], please contact our office at [phone] between the hours of 8:30 a.m. and 4:30 p.m.  Voicemails left after 4:30 p.m. will not be returned until the following business day.  For  prescription refill requests, have your pharmacy contact our office.       Resources For Cancer Patients and their Caregivers ? American Cancer Society: Can assist with transportation, wigs, general needs, runs Look Good Feel Better.        347-304-7754 ? Cancer Care: Provides financial assistance, online support groups, medication/co-pay assistance.  1-800-813-HOPE 530-075-5798) ? [place] Assists [place] cancer patients and their families through emotional , educational and financial support.  4806459394 ? [place] DSS Where to apply for food stamps, Medicaid and utility assistance. (947)531-0667 ? [employer]: Transportation to medical appointments. (940)480-8259 ? Social Security Administration: May apply for disability if have a Stage IV cancer. 425-368-4428 706-593-4392 ? [employer], Disability and Transit Services: Assists with nutrition, care and transit needs. [place] Support Programs:   > Cancer Support Group  2nd Tuesday of the month 1pm-2pm, Journey Room   > Creative Journey  3rd Tuesday of the month 1130am-1pm, Journey Room

## 2017-12-20 ENCOUNTER — Telehealth: Payer: Self-pay | Admitting: Genetic Counselor

## 2017-12-20 ENCOUNTER — Encounter: Payer: Self-pay | Admitting: Genetic Counselor

## 2017-12-20 DIAGNOSIS — Z1379 Encounter for other screening for genetic and chromosomal anomalies: Secondary | ICD-10-CM | POA: Insufficient documentation

## 2017-12-20 NOTE — Telephone Encounter (Signed)
No answer on the home or 1st mobile number.  The 2nd mobile number had a name on the VM that was not listed in the contact list.  I did not leave a message.

## 2017-12-23 ENCOUNTER — Telehealth (HOSPITAL_COMMUNITY): Payer: Self-pay | Admitting: *Deleted

## 2017-12-23 NOTE — Telephone Encounter (Signed)
I have made multiple attempts on multiple days to reach the pt with no luck. The numbers that are listed say the person is unavailable.

## 2017-12-27 ENCOUNTER — Telehealth: Payer: Self-pay | Admitting: Genetic Counselor

## 2017-12-27 NOTE — Telephone Encounter (Signed)
LM with father that I had test results to discuss.  He will give patient my information and have him call back.

## 2018-01-02 DIAGNOSIS — C163 Malignant neoplasm of pyloric antrum: Secondary | ICD-10-CM | POA: Diagnosis not present

## 2018-01-04 ENCOUNTER — Other Ambulatory Visit: Payer: Self-pay | Admitting: General Surgery

## 2018-01-04 ENCOUNTER — Encounter: Payer: Self-pay | Admitting: Genetic Counselor

## 2018-01-04 ENCOUNTER — Other Ambulatory Visit: Payer: Self-pay | Admitting: Nurse Practitioner

## 2018-01-04 DIAGNOSIS — K5900 Constipation, unspecified: Secondary | ICD-10-CM

## 2018-01-04 DIAGNOSIS — R0989 Other specified symptoms and signs involving the circulatory and respiratory systems: Secondary | ICD-10-CM

## 2018-01-04 DIAGNOSIS — K219 Gastro-esophageal reflux disease without esophagitis: Secondary | ICD-10-CM

## 2018-01-12 ENCOUNTER — Ambulatory Visit (HOSPITAL_COMMUNITY): Payer: Self-pay | Admitting: Internal Medicine

## 2018-01-13 ENCOUNTER — Encounter: Payer: Self-pay | Admitting: Genetic Counselor

## 2018-01-13 ENCOUNTER — Ambulatory Visit: Payer: Self-pay | Admitting: Genetic Counselor

## 2018-01-13 ENCOUNTER — Telehealth: Payer: Self-pay | Admitting: Genetic Counselor

## 2018-01-13 DIAGNOSIS — C169 Malignant neoplasm of stomach, unspecified: Secondary | ICD-10-CM

## 2018-01-13 DIAGNOSIS — Z1379 Encounter for other screening for genetic and chromosomal anomalies: Secondary | ICD-10-CM

## 2018-01-13 DIAGNOSIS — Z809 Family history of malignant neoplasm, unspecified: Secondary | ICD-10-CM

## 2018-01-13 NOTE — Telephone Encounter (Signed)
Revealed negative genetic testing.  Discussed that we do not know why he has gastric cancer or why there is cancer in the family. It could be due to a different gene that we are not testing, or maybe our current technology may not be able to pick something up.  It will be important for him to keep in contact with genetics to keep up with whether additional testing may be needed.

## 2018-01-13 NOTE — Progress Notes (Signed)
HPI:  Mr. Henkels was previously seen in the [place] clinic due to a personal and family history of cancer and concerns regarding a hereditary predisposition to cancer. Please refer to our prior cancer genetics clinic note for more information regarding Mr. Kassel medical, social and family histories, and our assessment and recommendations, at the time. Mr. Wessinger recent genetic test results were disclosed to him, as were recommendations warranted by these results. These results and recommendations are discussed in more detail below.  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  The Hereditary Gene Panel offered by [facility] includes sequencing and/or deletion duplication testing of the following 47 genes: APC, ATM, AXIN2, BARD1, BMPR1A, BRCA1, BRCA2, BRIP1, CDH1, CDK4, CDKN2A (p14ARF), CDKN2A (p16INK4a), CHEK2, CTNNA1, DICER1, EPCAM (Deletion/duplication testing only), GREM1 (promoter region deletion/duplication testing only), KIT, MEN1, MLH1, MSH2, MSH3, MSH6, MUTYH, NBN, NF1, NHTL1, PALB2, PDGFRA, PMS2, POLD1, POLE, PTEN, RAD50, RAD51C, RAD51D, SDHB, SDHC, SDHD, SMAD4, SMARCA4. STK11, TP53, TSC1, TSC2, and VHL.  The following genes were evaluated for sequence changes only: SDHA and HOXB13 c.251G>A variant only. The report date is December 16, 2017.        FAMILY HISTORY:  We obtained a detailed, 4-generation family history.  Significant diagnoses are listed below: Family History  Problem Relation Age of Onset  . Diabetes Mother   . Heart disease Mother        before age 72- Triple BPG  . Cancer Mother        Luekemia  . Deep vein thrombosis Mother   . Hyperlipidemia Mother   . Hypertension Mother   . Heart attack Mother   . Peripheral vascular disease Mother   . Stroke Mother        x 3  .  Pancreatic cancer Mother   . Arthritis Father   . Hypertension Father   . Kidney disease Father   . Cancer Maternal Grandmother        NOS  . Diabetes Cousin   . Cancer Cousin        NOS, pat first cousin  . Cancer Cousin        NOS, pat first cousin  . Colon cancer Neg Hx   . Gastric cancer Neg Hx   . Esophageal cancer Neg Hx     The patient does not have children. He has three brothers and one sister.  One brother was killed in a car accident.  The other siblings are living and cancer free.  His mother is deceased and his father is living.  The patient's mother was diagnosed with many health problems, but had leukemia and pancreatic cancer prior to her death at age 50.  She had two brothers and 1-2 sisters, who were all cancer free.  One maternal cousin has diabetes.  The maternal grandparents are deceased.  The patient' thinks his grandmother may have had cancer, but does not know what type.  The patient's father is one of 12-13 children.  The patient does not know of any cancer in his aunts or uncles. He has two male paternal cousins that have had cancer, but the patient does not know what type.  The paternal grandparents are both deceased.  Mr. Gustafson is unaware of previous family history of genetic testing for hereditary cancer  risks. Patient's maternal ancestors are of [place] and [place] American descent, and paternal ancestors are of African American descent. There is no reported Ashkenazi Jewish ancestry. There is no known consanguinity.  GENETIC TEST RESULTS: Genetic testing reported out on December 16, 2017 through the common hereditary cancer panel cancer panel found no deleterious mutations.  The Hereditary Gene Panel offered by [facility] includes sequencing and/or deletion duplication testing of the following 47 genes: APC, ATM, AXIN2, BARD1, BMPR1A, BRCA1, BRCA2, BRIP1, CDH1, CDK4, CDKN2A (p14ARF), CDKN2A (p16INK4a), CHEK2, CTNNA1, DICER1, EPCAM (Deletion/duplication testing  only), GREM1 (promoter region deletion/duplication testing only), KIT, MEN1, MLH1, MSH2, MSH3, MSH6, MUTYH, NBN, NF1, NHTL1, PALB2, PDGFRA, PMS2, POLD1, POLE, PTEN, RAD50, RAD51C, RAD51D, SDHB, SDHC, SDHD, SMAD4, SMARCA4. STK11, TP53, TSC1, TSC2, and VHL.  The following genes were evaluated for sequence changes only: SDHA and HOXB13 c.251G>A variant only.   The test report has been scanned into EPIC and is located under the Molecular Pathology section of the Results Review tab.    We discussed with Mr. Vandam that since the current genetic testing is not perfect, it is possible there may be a gene mutation in one of these genes that current testing cannot detect, but that chance is small.  We also discussed, that it is possible that another gene that has not yet been discovered, or that we have not yet tested, is responsible for the cancer diagnoses in the family, and it is, therefore, important to remain in touch with cancer genetics in the future so that we can continue to offer Mr. Pellow the most up to date genetic testing.    CANCER SCREENING RECOMMENDATIONS:  This result is reassuring and indicates that Mr. Lycan likely does not have an increased risk for a future cancer due to a mutation in one of these genes. This normal test also suggests that Mr. Valadez cancer was most likely not due to an inherited predisposition associated with one of these genes.  Most cancers happen by chance and this negative test suggests that his cancer falls into this category.  We, therefore, recommended he continue to follow the cancer management and screening guidelines provided by his oncology and primary healthcare provider.   An individual's cancer risk and medical management are not determined by genetic test results alone. Overall cancer risk assessment incorporates additional factors, including personal medical history, family history, and any available genetic information that may result in a personalized plan for  cancer prevention and surveillance.  RECOMMENDATIONS FOR FAMILY MEMBERS:  Women in this family might be at some increased risk of developing cancer, over the general population risk, simply due to the family history of cancer.  We recommended women in this family have a yearly mammogram beginning at age 55, or 47 years younger than the earliest onset of cancer, an annual clinical breast exam, and perform monthly breast self-exams. Women in this family should also have a gynecological exam as recommended by their primary provider. All family members should have a colonoscopy by age 23.  FOLLOW-UP: Lastly, we discussed with Mr. Marchena that cancer genetics is a rapidly advancing field and it is possible that new genetic tests will be appropriate for him and/or his family members in the future. We encouraged him to remain in contact with cancer genetics on an annual basis so we can update his personal and family histories and let him know of advances in cancer genetics that may benefit this family.   Our contact number was provided. Mr. Eckels questions  were answered to his satisfaction, and he knows he is welcome to call us at anytime with additional questions or concerns.   Roma Kayser, MS, [facility] Certified Genetic Counselor [email or web address]

## 2018-01-17 ENCOUNTER — Encounter (HOSPITAL_COMMUNITY): Payer: Self-pay | Admitting: Internal Medicine

## 2018-01-17 ENCOUNTER — Other Ambulatory Visit (HOSPITAL_COMMUNITY): Payer: Self-pay

## 2018-01-17 ENCOUNTER — Inpatient Hospital Stay (HOSPITAL_COMMUNITY): Payer: Medicare Other | Attending: Internal Medicine | Admitting: Internal Medicine

## 2018-01-17 ENCOUNTER — Other Ambulatory Visit: Payer: Self-pay

## 2018-01-17 VITALS — BP 117/79 | HR 79 | Wt 195.9 lb

## 2018-01-17 DIAGNOSIS — C169 Malignant neoplasm of stomach, unspecified: Secondary | ICD-10-CM | POA: Diagnosis not present

## 2018-01-17 DIAGNOSIS — I714 Abdominal aortic aneurysm, without rupture: Secondary | ICD-10-CM

## 2018-01-17 DIAGNOSIS — I10 Essential (primary) hypertension: Secondary | ICD-10-CM

## 2018-01-17 DIAGNOSIS — I700 Atherosclerosis of aorta: Secondary | ICD-10-CM | POA: Diagnosis not present

## 2018-01-17 DIAGNOSIS — Z720 Tobacco use: Secondary | ICD-10-CM | POA: Diagnosis not present

## 2018-01-17 DIAGNOSIS — C163 Malignant neoplasm of pyloric antrum: Secondary | ICD-10-CM

## 2018-01-18 ENCOUNTER — Other Ambulatory Visit: Payer: Self-pay | Admitting: General Surgery

## 2018-01-20 NOTE — Progress Notes (Signed)
Diagnosis No diagnosis found.  Staging Cancer Staging No matching staging information was found for the patient.  Assessment and Plan:  1.  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  57 y.o. male initially seen by Dr. Sherrine Maples and reported he was in his usual state of health until around Christmas 2018 when he went camping in [place].  During that trip patient apparently had acute onset of epigastric pain associated with vomiting.  He reports retching with vomiting.  Sometime during that episode patient apparently attempted to put his fingers in the back of his throat to relieve the sensation of food being lodged thinking that he may have had a fish bone that was stuck in his throat.  This culminated in vomiting with small amount of blood.  He subsequently had relief from vomiting but he continued to have difficulty swallowing and hoarseness of voice.  Imaging of the neck showed posterior pharyngeal edema without evidence of any foreign object or fishbone.  The pain subsequently improved gradually.    He underwent a colonoscopy that showed tubular adenomas.  An EGD that revealed diffusely infiltrated wall of the stomach with superficial ulcerations.  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    PET scan was done 10/24/2017 and showed IMPRESSION: 1. Small focus of hypermetabolism along the lesser curvature of the antrum of the stomach, corresponding to recently diagnosed gastric neoplasm. No associated lymphadenopathy or definite findings to suggest metastatic disease to the neck, chest, abdomen or pelvis. 2. Aortic atherosclerosis, in addition to left main and 3 vessel coronary artery disease. Please note that although the presence of coronary artery calcium documents the presence of coronary artery disease, the severity of this disease and any potential stenosis cannot be assessed on this non-gated CT  examination. Assessment for potential risk factor modification, dietary therapy or pharmacologic therapy may be warranted, if clinically indicated. 3. In addition, there is fusiform aneurysmal dilatation of the infrarenal abdominal aorta which measures up to 3.9 x 3.7 cm. Recommend followup by ultrasound in 2 years. This recommendation follows ACR consensus guidelines: White Paper of the ACR Incidental Findings Committee II on Vascular Findings. J Am Coll Radiol 2013; 10:789-794.  By Dr. Barry Dienes of surgery.  He indicated he did not desire surgery or chemotherapy.  He is seen today for follow-up due to gastric cancer  Patient remains asymptomatic.   Long talk held with the patient today regarding diagnosis and treatment options.  He has Diffuse gastric adenocarcinoma that appears to be localized to the stomach.  Histologically he appears to have a signet ring variety with diffuse involvement.  I have discussed with him usual standard recommendations are for neoadjuvant chemotherapy followed by surgery followed by additional chemotherapy.  I have discussed with him that even if he were to be treated with surgery alone based on the diffuse nature of his cancer he was still be recommended for adjuvant therapy.  I have discussed with him a standard regimen of 5-FU dose at 2400 mg/m with 24 hours, oxaliplatin 85 mg/m, leucovorin 200 mg/m, and Taxotere 50 mg/m all on day 1 every 2 weeks for 4 cycles before surgery and 4 cycles following surgery.  Side effects of the medication were reviewed with the patient.  He was provided written information concerning the regimen.  He will be referred back to Dr. Barry Dienes for port placement.    Recent PET scan appears to show no evidence of distant disease.  He had also previously been recommended for EUS evaluation.  EUS that was done 11/24/2017 showed:  Normal esophagus, ulcerated mass involving the lesser curvature of the stomach approximately mid body.  Mass  measured 5 cm across 5 cm in length.  Mass appeared to be 7- 8 cm from the pylorus.  It was also 5 to 6 cm from the GE junction.  It appeared to pass into and through the muscularis propria as well as the serosa making it a T4 a lesion.  Mass was 1.6 cm in maximal thickness.  No adenopathy was noted.  He was staged as a T4 a N0 by EUS.    He  will return to clinic after port placement for follow-up.  He will be set up for chemotherapy teaching.  Patient has also been referred for genetic testing.  2.  Hypertension.  Pressure is 117/79.  Follow-up with PCP.  3.  Smoking.  Cessation is recommended.  PET scan showed no clear lung abnormalities.  4.  Abdominal aortic aneurysm.  This was noted on recent PET scan.  Patient was also noted to have atherosclerosis on recent PET scan.  He has been seen by vascular surgery and cardiology in the past and was on aspirin and statins.   Current Status: He is seen today for follow-up.  He is here to discuss treatment options.    Malignant neoplasm of stomach ([place])   11/11/2017 Initial Diagnosis    Malignant neoplasm of stomach ([place])      12/16/2017 Genetic Testing    Negative genetic testing on the common hereditary cancer panel.  The Hereditary Gene Panel offered by [facility] includes sequencing and/or deletion duplication testing of the following 47 genes: APC, ATM, AXIN2, BARD1, BMPR1A, BRCA1, BRCA2, BRIP1, CDH1, CDK4, CDKN2A (p14ARF), CDKN2A (p16INK4a), CHEK2, CTNNA1, DICER1, EPCAM (Deletion/duplication testing only), GREM1 (promoter region deletion/duplication testing only), KIT, MEN1, MLH1, MSH2, MSH3, MSH6, MUTYH, NBN, NF1, NHTL1, PALB2, PDGFRA, PMS2, POLD1, POLE, PTEN, RAD50, RAD51C, RAD51D, SDHB, SDHC, SDHD, SMAD4, SMARCA4. STK11, TP53, TSC1, TSC2, and VHL.  The following genes were evaluated for sequence changes only: SDHA and HOXB13 c.251G>A variant only. The report date is December 16, 2017.         Problem List Patient Active Problem List    Diagnosis Date Noted  . Genetic testing [Z13.79] 12/20/2017  . Family history of cancer [Z80.9]   . IBS (irritable bowel syndrome) [K58.9] 11/28/2017  . Malignant neoplasm of stomach ([place]) [C16.9] 11/11/2017  . Constipation [K59.00] 08/30/2017  . GERD (gastroesophageal reflux disease) [K21.9] 08/30/2017  . Globus sensation [F45.8] 08/30/2017  . PAD (peripheral artery disease) ([place]) [I73.9] 08/09/2014  . CAD (coronary artery disease) [I25.10] 07/22/2014  . Tobacco abuse [Z72.0] 07/22/2014  . 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 Hypertension [I10] 07/19/2012  . Hyperlipidemia [E78.5] 07/19/2012  . PVD (peripheral vascular disease) with claudication ([place]) [I73.9] 06/29/2012  . Atherosclerosis of native arteries of the extremities with intermittent claudication [I70.219] 04/20/2012    Past Medical History Past Medical History:  Diagnosis Date  . CAD (coronary artery disease)    a. Cath 07/2012 following abnormal nuclear stress test: 20-30% LAD, 20-30% OM1, occluded branch off OM2 that fills slowly via collaterals, small nondominant RCA occluded proximally, fills slowly via collaterals.  . CKD (chronic kidney disease), stage II   . Family history of cancer   . Gastric adenocarcinoma ([place])   . Hyperlipidemia   . Hypertension   . Peripheral vascular disease ([place])  a. bilateral superficial femoral artery stents in May/August of 2012. b. Left femoral to above-knee popliteal bypass in 08/2012.  Marland Kitchen Renal insufficiency   . Stroke [facility])    2008 right side weakness  . Tobacco abuse     Past Surgical History Past Surgical History:  Procedure Laterality Date  . ABDOMINAL AORTAGRAM N/A 04/27/2012   Procedure: ABDOMINAL Maxcine Ham;  Surgeon: Conrad Pharr, MD;  [facility] CATH LAB;  Service: Cardiovascular;  Laterality: N/A;  . ABDOMINAL AORTAGRAM N/A 08/09/2014   Procedure: ABDOMINAL Maxcine Ham;  Surgeon: Elam Dutch, MD;  Location: [facility] CATH LAB;  Service:  Cardiovascular;  Laterality: N/A;  . BIOPSY  10/06/2017   Procedure: BIOPSY;  Surgeon: Daneil Dolin, MD;  Location: [facility] ENDO SUITE;  Service: Endoscopy;;  gastric   . CARDIAC CATHETERIZATION    . COLONOSCOPY    . COLONOSCOPY WITH PROPOFOL N/A 10/06/2017   Procedure: COLONOSCOPY WITH PROPOFOL;  Surgeon: Daneil Dolin, MD;  Location: [facility] ENDO SUITE;  Service: Endoscopy;  Laterality: N/A;  1:45pm - patient can't come earlier due to transportation  . ESOPHAGOGASTRODUODENOSCOPY (EGD) WITH PROPOFOL N/A 10/06/2017   Procedure: ESOPHAGOGASTRODUODENOSCOPY (EGD) WITH PROPOFOL;  Surgeon: Daneil Dolin, MD;  Location: [facility] END[facility];  Service: Endoscopy;  Laterality: N/A;  . EUS N/A 11/24/2017   Procedure: UPPER ENDOSCOPIC ULTRASOUND (EUS) RADIAL;  Surgeon: Milus Banister, MD;  Location: [facility] ENDOSCOPY;  Service: Endoscopy;  Laterality: N/A;  . FEMORAL ARTERY - POPLITEAL ARTERY BYPASS GRAFT    . FEMORAL-POPLITEAL BYPASS GRAFT  09/26/2012   Procedure: BYPASS GRAFT FEMORAL-POPLITEAL ARTERY;  Surgeon: Elam Dutch, MD;  Location: [facility] OR;  Service: Vascular;  Laterality: Left;  using 50m Gore-Tex Propaten Ringed Graft, Vein patch on the proximal and distal anastomosis  . FEMORAL-POPLITEAL BYPASS GRAFT Right 08/12/2014   Procedure:  FEMORAL-POPLITEAL ARTERY BYPASS WITH SAPHENOUS VEIN GRAFT, INTRAOPERATIVE ARTERIOGRAM;  Surgeon: CElam Dutch MD;  [facility]  Service: Vascular;  Laterality: Right;  . INTRAOPERATIVE ARTERIOGRAM  08/12/2014   Procedure: INTRA OPERATIVE ARTERIOGRAM;  Surgeon: CElam Dutch MD;  Location: [facility]  Service: Vascular;;  . POLYPECTOMY  10/06/2017   Procedure: POLYPECTOMY;  Surgeon: RDaneil Dolin MD;  [facility];  Service: Endoscopy;;  splenic flexure,  ascending  . PR VEIN BYPASS GRAFT,AORTO-FEM-POP    . stents in bil legs      Family History Family History  Problem Relation Age of Onset  . Diabetes Mother   . Heart disease Mother        before  age 55 Triple BPG  . Cancer Mother        Luekemia  . Deep vein thrombosis Mother   . Hyperlipidemia Mother   . Hypertension Mother   . Heart attack Mother   . Peripheral vascular disease Mother   . Stroke Mother        x 3  . Pancreatic cancer Mother   . Arthritis Father   . Hypertension Father   . Kidney disease Father   . Cancer Maternal Grandmother        NOS  . Diabetes Cousin   . Cancer Cousin        NOS, pat first cousin  . Cancer Cousin        NOS, pat first cousin  . Colon cancer Neg Hx   . Gastric cancer Neg Hx   . Esophageal cancer Neg Hx      Social History  reports that  he quit smoking about 10 months ago. His smoking use included cigarettes. He started smoking about 43 years ago. He has a 25.00 pack-year smoking history. He has never used smokeless tobacco. He reports that he has current or past drug history. Drug: Marijuana. He reports that he does not drink alcohol.  Medications  Current Outpatient Medications:  .  amLODipine (NORVASC) 10 MG tablet, TAKE ONE TABLET BY MOUTH ONCE DAILY, Disp: 90 tablet, Rfl: 3 .  aspirin EC 81 MG tablet, Take 81 mg by mouth daily., Disp: , Rfl:  .  lisinopril (PRINIVIL,ZESTRIL) 40 MG tablet, Take 40 mg by mouth at bedtime., Disp: , Rfl:  .  lubiprostone (AMITIZA) 8 MCG capsule, Take 1 capsule (8 mcg total) by mouth 2 (two) times daily with a meal., Disp: 60 capsule, Rfl: 11 .  pantoprazole (PROTONIX) 40 MG tablet, TAKE 1 TABLET BY MOUTH TWICE DAILY BEFORE A MEAL, Disp: 60 tablet, Rfl: 3 .  rosuvastatin (CRESTOR) 10 MG tablet, TAKE ONE TABLET BY MOUTH ONCE DAILY (Patient taking differently: TAKE 10 MG BY MOUTH ONCE DAILY AT BEDTIME), Disp: 90 tablet, Rfl: 3 .  valACYclovir (VALTREX) 500 MG tablet, Take 500 mg by mouth at bedtime., Disp: , Rfl:  .  vitamin B-12 (CYANOCOBALAMIN) 1000 MCG tablet, Take 1,000 mcg by mouth daily., Disp: , Rfl:   Allergies Potassium-containing compounds  Review of Systems Review of Systems -  Oncology ROS as per HPI otherwise 12 point ROS is negative.   Physical Exam  Vitals Wt Readings from Last 3 Encounters:  01/17/18 195 lb 14.4 oz (88.9 kg)  12/15/17 197 lb 11.2 oz (89.7 kg)  11/28/17 203 lb (92.1 kg)   Temp Readings from Last 3 Encounters:  12/15/17 98.5 F (36.9 C) (Oral)  11/28/17 97.6 F (36.4 C) (Oral)   BP Readings from Last 3 Encounters:  01/17/18 117/79  12/15/17 124/80  11/28/17 126/88   Pulse Readings from Last 3 Encounters:  01/17/18 79  12/15/17 84  11/28/17 76   Constitutional: Well-developed, well-nourished, and in no distress.   HENT: Head: Normocephalic and atraumatic.  Mouth/Throat: No oropharyngeal exudate. Mucosa moist. Eyes: Pupils are equal, round, and reactive to light. Conjunctivae are normal. No scleral icterus.  Neck: Normal range of motion. Neck supple. No JVD present.  Cardiovascular: Normal rate, regular rhythm and normal heart sounds.  Exam reveals no gallop and no friction rub.   No murmur heard. Pulmonary/Chest: Effort normal and breath sounds normal. No respiratory distress. No wheezes.No rales.  Abdominal: Soft. Bowel sounds are normal. No distension. There is no tenderness. There is no guarding.  Musculoskeletal: No edema or tenderness.  Lymphadenopathy: No cervical, axillary or supraclavicular adenopathy.  Neurological: Alert and oriented to person, place, and time. No cranial nerve deficit.  Skin: Skin is warm and dry. No rash noted. No erythema. No pallor.  Psychiatric: Affect and judgment normal.   Labs No visits with results within 3 Day(s) from this visit.  Latest known visit with results is:  Hospital Outpatient Visit on 10/24/2017  Component Date Value Ref Range Status  . Glucose-Capillary 10/24/2017 90  65 - 99 mg/dL Final     Pathology No orders of the defined types were placed in this encounter.      Zoila Shutter MD

## 2018-01-23 ENCOUNTER — Encounter (HOSPITAL_COMMUNITY): Payer: Self-pay | Admitting: *Deleted

## 2018-01-23 ENCOUNTER — Other Ambulatory Visit (HOSPITAL_COMMUNITY): Payer: Self-pay | Admitting: Pharmacist

## 2018-01-23 ENCOUNTER — Other Ambulatory Visit: Payer: Self-pay

## 2018-01-23 DIAGNOSIS — C169 Malignant neoplasm of stomach, unspecified: Secondary | ICD-10-CM

## 2018-01-23 NOTE — H&P (Signed)
Todd [name] Documented: 01/02/2018 3:19 PM Location: [facility] Patient #: [id number] DOB: [birth year] Single / Language: [place] / Race: Black or African American Male   History of Present Illness [name] MD; 01/02/2018 5:25 PM) The patient is a 57 year old male who presents for a follow-up for Gastric cancer. Patient is a 57 year old male referred by Dr. Hezzie Bump for a gastric adenocarcinoma diagnosed January 2019. The patient presented with a year of worsening upper abdominal pain. He also had several episodes of hematemesis. And upper endoscopy demonstrated significant infiltrative process in the stomach, primarily at the gastric antum . Biopsies were positive for adenocarcinoma. He had some mucosa of the remainder of the stomach that appeared abnormal. He does not smoke cigarettes any longer but is still smoking cigars and pot.  Since his last visit, he had EUS which showed a uT4aN0 tumor. I recommended neoadjuvant chemo. He discussed this a bit with oncology at [facility], but wasn't sure what he wanted to do. He is here with his dad and his sister is on speaker phone to review the disease process and recommendations.   EUS 11/24/2017 Findings: 1. Normal esophagus 2. Ulcerated, clearly malignant mass involving the lesser curvature of the stomach, approximately mid body. The mass is 5cm across, 5cm in length. The distal most aspect of the mass is 7-8cm from the pylorus. The proximal most aspect of the mass is 5-6cm from the GE junction. Endosonographic Finding 1. The mass above correlates with a hypoechoic lesion that clearly passes into and through the muscularis propria layer of the gastric wall and also appears to invade the serosa (uT4a). The mass is 1.6cm in maximal thickness 2. No perigastric adenopathy. - 5cm long, 5cm across, 1.6cm deep uT4aN0 gastric adenocarcinoma involving the mid body along the lesser curvature.   EGD 10/06/2017 Mild Erosive  Reflux Esophagitis. Small hiatal hernia. - Markedly abnormal appearing gastric mucosa with overlying ulceration concerning for infiltrating process.?Status post biopsy.  Pathology 10/06/2017 Diagnosis 1. Stomach, biopsy - POORLY DIFFERENTIATED ADENOCARCINOMA WITH SIGNET RING CELL FEATURES - SEE COMMENT 2. Colon, polyp(s), splenic flexure, ascending - TUBULAR ADENOMA (2 OF 2 FRAGMENTS) - NO HIGH GRADE DYSPLASIA OR MALIGNANCY IDENTIFIED Microscopic Comment 1. Dr. Kenard Gower reviewed the case and agrees with the above diagnosis.  PET scan 10/24/2017 metastasis.  IMPRESSION: 1. Small focus of hypermetabolism along the lesser curvature of the antrum of the stomach, corresponding to recently diagnosed gastric neoplasm. No associated lymphadenopathy or definite findings to suggest metastatic disease to the neck, chest, abdomen or pelvis. 2. Aortic atherosclerosis, in addition to left main and 3 vessel coronary artery disease. Please note that although the presence of coronary artery calcium documents the presence of coronary artery disease, the severity of this disease and any potential stenosis cannot be assessed on this non-gated CT examination. Assessment for potential risk factor modification, dietary therapy or pharmacologic therapy may be warranted, if clinically indicated. 3. In addition, there is fusiform aneurysmal dilatation of the infrarenal abdominal aorta which measures up to 3.9 x 3.7 cm. Recommend followup by ultrasound in 2 years. This recommendation follows ACR consensus guidelines: White Paper of the ACR Incidental Findings Committee II on Vascular Findings. J Am Coll Radiol 2013; 10:789-794. 4. Additional incidental findings, as above.    Allergies (April Staton, CMA; 01/02/2018 3:20 PM) Potassium-containing Compounds   Medication History (April Staton, CMA; 01/02/2018 3:20 PM) AmLODIPine Besylate (10MG  Tablet, Oral) Active. Pantoprazole Sodium (40MG  Tablet DR,  Oral) Active. Aspirin (81MG  Tablet, Oral) Active.  Lisinopril (40MG  Tablet, Oral) Active. Rosuvastatin Calcium (10MG  Tablet, Oral) Active. ValACYclovir HCl (500MG  Tablet, Oral) Active. Medications Reconciled    Review of Systems [name] MD; 01/02/2018 5:25 PM) All other systems negative  Vitals (April Staton CMA; 01/02/2018 3:21 PM) 01/02/2018 3:20 PM Weight: 200.13 lb Height: 73in Body Surface Area: 2.15 m Body Mass Index: 26.4 kg/m  Temp.: 98.85F(Oral)  Pulse: 74 (Regular)  P.OX: 98% (Room air) BP: 120/80 (Sitting, Left Arm, Standard)       Physical Exam [name] MD; 01/02/2018 5:26 PM) The physical exam findings are as follows: Note:entire visit spent in counseling.    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 I discussed why I recommend up front chemo, including higher likelihood of getting all the recommended treatment, treating the whole body up front, and hopefully shrinking the tumor to require a smaller portion of stomach to be removed.  I discussed that I would start with a diagnostic laparoscopy prior to opening because visible spread of disease is a contraindication for surgery.  I also discussed that it is good for gastric cancer to have a recommendation of surgery because it means that patients have a lower stage disease.  I reviewed post op recovery and need to either stay with someone else or have someone stay with him for 1-2 weeks after he leaves the hospital in order to help with the feeding tube pump and to help him prepare food.  He is going to discuss this with his family and make a decision. They stated that they would call us back about scheduling.  28 min spent in evaluation, examination, counseling, and  coordination of care. >50% spent in counseling. Current Plans Pt Education - ccs port insertion education   Signed by [name], MD (01/02/2018 5:30 PM)

## 2018-01-23 NOTE — Progress Notes (Signed)
Anesthesia Chart Review:  Pt is a same day work up    Case:  [id number] Date/Time:  01/24/18 0715   Procedure:  INSERTION PORT-A-CATH (N/A )   Anesthesia type:  General   Pre-op diagnosis:  Gastric cancer   Location:  [place] OR ROOM 09 / [place] [facility]   Surgeon:  [name], MD      DISCUSSION: Pt is a 57 year old male with hx CAD (mild except proximally occluded RCA and occluded small branch off the OM2, both fill slowly by collaterals by 2013 cath) and hx PAD (s/p B SFA stents 2012, L FPBG 2013, R FPBG 2015). Pt is overdue for cardiology f/u however needs port-a-cath for treatment for gastric cancer.    PROVIDERS: - PCP is Lavella Lemons, PA   Patient Care Team: Daneil Dolin, MD as Consulting Physician (Gastroenterology) Cardiologist is Carlyle Dolly, MD. Last office visit 12/09/16; 6 month f/u recommended but did not happen   LABS: Will be obtained day of surgery   IMAGES:  CXR 08/10/17: There is no acute cardiopulmonary abnormality.   EKG 08/10/17: Sinus rhythm. Borderline T abnormalities, inferior leads   CV:  Nuclear stress test 08/08/14:  - There is a small mild-mod intensity inferolateral defect partially reversible c/w ischemia, EF 61%, felt to be a low risk study. - Results discussed with Dr. Acie Fredrickson who feels this result is consistent with prior and consistent with his known anatomy  Cardiac cath 08/11/12:  - LM: normal - LAD: large, minor luminal irregularities. 20-30% stenosis diffusely. Several small diagonals which have only minor luminal irreg - CX: large and dominant,  20-30%, OM 1 is small and normal,  OM2 is a small vessel.  There is a small branch off the OM2 that is occluded and fills slowly via collaterals.  PDA is unremarkable - RCA: small , nondominant, occluded Proximally, fills slowly via collaterals   Past Medical History:  Diagnosis Date  . CAD (coronary artery disease)    a. Cath 07/2012 following abnormal nuclear stress test: 20-30% LAD, 20-30%  OM1, occluded branch off OM2 that fills slowly via collaterals, small nondominant RCA occluded proximally, fills slowly via collaterals.  . CKD (chronic kidney disease), stage II   . Family history of cancer   . Gastric adenocarcinoma ([place])   . Hyperlipidemia   . Hypertension   . Peripheral vascular disease ([place])    a. bilateral superficial femoral artery stents in May/August of 2012. b. Left femoral to above-knee popliteal bypass in 08/2012.  Marland Kitchen Renal insufficiency   . Stroke [facility])    2008 right side weakness  . Tobacco abuse     Past Surgical History:  Procedure Laterality Date  . ABDOMINAL AORTAGRAM N/A 04/27/2012   Procedure: ABDOMINAL Maxcine Ham;  Surgeon: Conrad Corydon, MD;  Location: [facility] CATH LAB;  Service: Cardiovascular;  Laterality: N/A;  . ABDOMINAL AORTAGRAM N/A 08/09/2014   Procedure: ABDOMINAL Maxcine Ham;  Surgeon: Elam Dutch, MD;  Location: [facility] CATH LAB;  Service: Cardiovascular;  Laterality: N/A;  . BIOPSY  10/06/2017   Procedure: BIOPSY;  Surgeon: Daneil Dolin, MD;  Location: [facility] ENDO SUITE;  Service: Endoscopy;;  gastric   . CARDIAC CATHETERIZATION    . COLONOSCOPY    . COLONOSCOPY WITH PROPOFOL N/A 10/06/2017   Procedure: COLONOSCOPY WITH PROPOFOL;  Surgeon: Daneil Dolin, MD;  Location: [facility] END[facility] SU[facility];  Service: Endoscopy;  Laterality: N/A;  1:45pm - patient can't come earlier due to transportation  . ESOPHAGOGASTRODUODENOSCOPY (EGD) WITH  PROPOFOL N/A 10/06/2017   Procedure: ESOPHAGOGASTRODUODENOSCOPY (EGD) WITH PROPOFOL;  Surgeon: Daneil Dolin, MD;  Location: [facility];  Service: Endoscopy;  Laterality: N/A;  . EUS N/A 11/24/2017   Procedure: UPPER ENDOSCOPIC ULTRASOUND (EUS) RADIAL;  Surgeon: Milus Banister, MD;  Location: [facility] ENDOSCOPY;  Service: Endoscopy;  Laterality: N/A;  . FEMORAL ARTERY - POPLITEAL ARTERY BYPASS GRAFT    . FEMORAL-POPLITEAL BYPASS GRAFT  09/26/2012   Procedure: BYPASS GRAFT FEMORAL-POPLITEAL ARTERY;  Surgeon: Elam Dutch, MD;   [facility] OR;  Service: Vascular;  Laterality: Left;  using 53mm Gore-Tex Propaten Ringed Graft, Vein patch on the proximal and distal anastomosis  . FEMORAL-POPLITEAL BYPASS GRAFT Right 08/12/2014   Procedure:  FEMORAL-POPLITEAL ARTERY BYPASS WITH SAPHENOUS VEIN GRAFT, INTRAOPERATIVE ARTERIOGRAM;  Surgeon: Elam Dutch, MD;  Location: [place];  Service: Vascular;  Laterality: Right;  . INTRAOPERATIVE ARTERIOGRAM  08/12/2014   Procedure: INTRA OPERATIVE ARTERIOGRAM;  Surgeon: Elam Dutch, MD;  Location: [place];  Service: Vascular;;  . POLYPECTOMY  10/06/2017   Procedure: POLYPECTOMY;  Surgeon: Daneil Dolin, MD;  Location: [facility];  Service: Endoscopy;;  splenic flexure,  ascending  . PR VEIN BYPASS GRAFT,AORTO-FEM-POP    . stents in bil legs      MEDICATIONS: No current facility-administered medications for this encounter.    Marland Kitchen amLODipine (NORVASC) 10 MG tablet  . aspirin EC 81 MG tablet  . lisinopril (PRINIVIL,ZESTRIL) 40 MG tablet  . lubiprostone (AMITIZA) 8 MCG capsule  . pantoprazole (PROTONIX) 40 MG tablet  . rosuvastatin (CRESTOR) 10 MG tablet  . valACYclovir (VALTREX) 500 MG tablet  . vitamin B-12 (CYANOCOBALAMIN) 1000 MCG tablet    If labs acceptable day of surgery, I anticipate pt can proceed with surgery as scheduled.   Willeen Cass, FNP-BC [facility] Short Stay Surgical Center/Anesthesiology Phone: [phone] 01/23/2018 11:10 AM

## 2018-01-24 ENCOUNTER — Encounter (HOSPITAL_COMMUNITY)
Admission: RE | Disposition: A | Discharge status: Home / Self Care | Payer: Self-pay | Source: Ambulatory Visit | Attending: General Surgery

## 2018-01-24 ENCOUNTER — Ambulatory Visit (HOSPITAL_COMMUNITY): Payer: Medicare Other

## 2018-01-24 ENCOUNTER — Encounter (HOSPITAL_COMMUNITY): Payer: Self-pay | Admitting: *Deleted

## 2018-01-24 ENCOUNTER — Ambulatory Visit (HOSPITAL_COMMUNITY): Payer: Medicare Other | Admitting: Emergency Medicine

## 2018-01-24 ENCOUNTER — Ambulatory Visit (HOSPITAL_COMMUNITY)
Admission: RE | Admit: 2018-01-24 | Discharge: 2018-01-24 | Disposition: A | Discharge status: Home / Self Care | Payer: Medicare Other | Source: Ambulatory Visit | Attending: General Surgery | Admitting: General Surgery

## 2018-01-24 DIAGNOSIS — C169 Malignant neoplasm of stomach, unspecified: Secondary | ICD-10-CM | POA: Diagnosis not present

## 2018-01-24 DIAGNOSIS — Z419 Encounter for procedure for purposes other than remedying health state, unspecified: Secondary | ICD-10-CM

## 2018-01-24 DIAGNOSIS — Z7982 Long term (current) use of aspirin: Secondary | ICD-10-CM | POA: Diagnosis not present

## 2018-01-24 DIAGNOSIS — I129 Hypertensive chronic kidney disease with stage 1 through stage 4 chronic kidney disease, or unspecified chronic kidney disease: Secondary | ICD-10-CM | POA: Insufficient documentation

## 2018-01-24 DIAGNOSIS — I739 Peripheral vascular disease, unspecified: Secondary | ICD-10-CM | POA: Diagnosis not present

## 2018-01-24 DIAGNOSIS — Z79899 Other long term (current) drug therapy: Secondary | ICD-10-CM | POA: Diagnosis not present

## 2018-01-24 DIAGNOSIS — C162 Malignant neoplasm of body of stomach: Secondary | ICD-10-CM | POA: Diagnosis not present

## 2018-01-24 DIAGNOSIS — N182 Chronic kidney disease, stage 2 (mild): Secondary | ICD-10-CM | POA: Insufficient documentation

## 2018-01-24 DIAGNOSIS — E785 Hyperlipidemia, unspecified: Secondary | ICD-10-CM | POA: Diagnosis not present

## 2018-01-24 DIAGNOSIS — F17290 Nicotine dependence, other tobacco product, uncomplicated: Secondary | ICD-10-CM | POA: Diagnosis not present

## 2018-01-24 DIAGNOSIS — K210 Gastro-esophageal reflux disease with esophagitis: Secondary | ICD-10-CM | POA: Insufficient documentation

## 2018-01-24 DIAGNOSIS — I69351 Hemiplegia and hemiparesis following cerebral infarction affecting right dominant side: Secondary | ICD-10-CM | POA: Insufficient documentation

## 2018-01-24 DIAGNOSIS — Z4682 Encounter for fitting and adjustment of non-vascular catheter: Secondary | ICD-10-CM | POA: Diagnosis not present

## 2018-01-24 DIAGNOSIS — Z888 Allergy status to other drugs, medicaments and biological substances status: Secondary | ICD-10-CM | POA: Diagnosis not present

## 2018-01-24 DIAGNOSIS — C163 Malignant neoplasm of pyloric antrum: Secondary | ICD-10-CM | POA: Diagnosis not present

## 2018-01-24 DIAGNOSIS — I2510 Atherosclerotic heart disease of native coronary artery without angina pectoris: Secondary | ICD-10-CM | POA: Insufficient documentation

## 2018-01-24 DIAGNOSIS — Z452 Encounter for adjustment and management of vascular access device: Secondary | ICD-10-CM | POA: Diagnosis not present

## 2018-01-24 DIAGNOSIS — Z95828 Presence of other vascular implants and grafts: Secondary | ICD-10-CM

## 2018-01-24 HISTORY — PX: PORTACATH PLACEMENT: SHX2246

## 2018-01-24 HISTORY — DX: Personal history of other medical treatment: Z92.89

## 2018-01-24 LAB — CBC
HCT: 36.1 % — ABNORMAL LOW (ref 39.0–52.0)
Hemoglobin: 12.1 g/dL — ABNORMAL LOW (ref 13.0–17.0)
MCH: 29.2 pg (ref 26.0–34.0)
MCHC: 33.5 g/dL (ref 30.0–36.0)
MCV: 87.2 fL (ref 78.0–100.0)
Platelets: 247 10*3/uL (ref 150–400)
RBC: 4.14 MIL/uL — AB (ref 4.22–5.81)
RDW: 14.7 % (ref 11.5–15.5)
WBC: 13.4 10*3/uL — AB (ref 4.0–10.5)

## 2018-01-24 LAB — BASIC METABOLIC PANEL
ANION GAP: 11 (ref 5–15)
BUN: 9 mg/dL (ref 6–20)
CHLORIDE: 102 mmol/L (ref 101–111)
CO2: 24 mmol/L (ref 22–32)
Calcium: 9.3 mg/dL (ref 8.9–10.3)
Creatinine, Ser: 1.19 mg/dL (ref 0.61–1.24)
GFR calc Af Amer: >60 mL/min (ref >60)
Glucose, Bld: 87 mg/dL (ref 65–99)
POTASSIUM: 3.4 mmol/L — AB (ref 3.5–5.1)
SODIUM: 137 mmol/L (ref 135–145)

## 2018-01-24 SURGERY — INSERTION, TUNNELED CENTRAL VENOUS DEVICE, WITH PORT
Anesthesia: General | Site: Chest

## 2018-01-24 MED ORDER — PROPOFOL 10 MG/ML IV BOLUS
INTRAVENOUS | Status: AC
  Filled 2018-01-24: qty 40

## 2018-01-24 MED ORDER — LIDOCAINE HCL 1 % IJ SOLN
INTRAMUSCULAR | Status: AC
  Filled 2018-01-24: qty 20

## 2018-01-24 MED ORDER — SODIUM CHLORIDE 0.9 % IV SOLN
INTRAVENOUS | Status: AC
  Filled 2018-01-24: qty 1.2

## 2018-01-24 MED ORDER — CHLORHEXIDINE GLUCONATE CLOTH 2 % EX PADS: 6.0000 | MEDICATED_PAD | Freq: Once | CUTANEOUS | Status: DC

## 2018-01-24 MED ORDER — MIDAZOLAM HCL 5 MG/5ML IJ SOLN
INTRAMUSCULAR | Status: DC | PRN
  Administered 2018-01-24: 2 mg via INTRAVENOUS

## 2018-01-24 MED ORDER — DEXAMETHASONE SODIUM PHOSPHATE 10 MG/ML IJ SOLN
INTRAMUSCULAR | Status: DC | PRN
  Administered 2018-01-24: 10 mg via INTRAVENOUS

## 2018-01-24 MED ORDER — DEXAMETHASONE SODIUM PHOSPHATE 10 MG/ML IJ SOLN
INTRAMUSCULAR | Status: AC
  Filled 2018-01-24: qty 1

## 2018-01-24 MED ORDER — FENTANYL CITRATE (PF) 100 MCG/2ML IJ SOLN
INTRAMUSCULAR | Status: DC | PRN
  Administered 2018-01-24: 50 ug via INTRAVENOUS

## 2018-01-24 MED ORDER — CEFAZOLIN SODIUM-DEXTROSE 2-4 GM/100ML-% IV SOLN
2.0000 g | INTRAVENOUS | Status: AC
  Administered 2018-01-24: 2 g via INTRAVENOUS
  Filled 2018-01-24: qty 100

## 2018-01-24 MED ORDER — BUPIVACAINE-EPINEPHRINE (PF) 0.25% -1:200000 IJ SOLN
INTRAMUSCULAR | Status: AC
  Filled 2018-01-24: qty 30

## 2018-01-24 MED ORDER — SODIUM CHLORIDE 0.9 % IV SOLN
INTRAVENOUS | Status: DC | PRN
  Administered 2018-01-24: 07:00:00

## 2018-01-24 MED ORDER — MIDAZOLAM HCL 2 MG/2ML IJ SOLN
INTRAMUSCULAR | Status: AC
  Filled 2018-01-24: qty 2

## 2018-01-24 MED ORDER — GABAPENTIN 300 MG PO CAPS
300.0000 mg | ORAL_CAPSULE | ORAL | Status: AC
  Administered 2018-01-24: 300 mg via ORAL
  Filled 2018-01-24: qty 1

## 2018-01-24 MED ORDER — LIDOCAINE HCL 1 % IJ SOLN
INTRAMUSCULAR | Status: DC | PRN
  Administered 2018-01-24: 10 mL

## 2018-01-24 MED ORDER — HYDROMORPHONE HCL 2 MG/ML IJ SOLN: 0.3000 mg | INTRAMUSCULAR | Status: DC | PRN

## 2018-01-24 MED ORDER — LIDOCAINE 2% (20 MG/ML) 5 ML SYRINGE
INTRAMUSCULAR | Status: AC
  Filled 2018-01-24: qty 5

## 2018-01-24 MED ORDER — FENTANYL CITRATE (PF) 100 MCG/2ML IJ SOLN: 25.0000 ug | INTRAMUSCULAR | Status: DC | PRN

## 2018-01-24 MED ORDER — LIDOCAINE 2% (20 MG/ML) 5 ML SYRINGE
INTRAMUSCULAR | Status: DC | PRN
  Administered 2018-01-24: 100 mg via INTRAVENOUS

## 2018-01-24 MED ORDER — ACETAMINOPHEN 500 MG PO TABS
1000.0000 mg | ORAL_TABLET | ORAL | Status: AC
  Administered 2018-01-24: 1000 mg via ORAL
  Filled 2018-01-24: qty 2

## 2018-01-24 MED ORDER — PROPOFOL 10 MG/ML IV BOLUS
INTRAVENOUS | Status: DC | PRN
  Administered 2018-01-24: 180 mg via INTRAVENOUS

## 2018-01-24 MED ORDER — 0.9 % SODIUM CHLORIDE (POUR BTL) OPTIME
TOPICAL | Status: DC | PRN
  Administered 2018-01-24: 1000 mL

## 2018-01-24 MED ORDER — ONDANSETRON HCL 4 MG/2ML IJ SOLN
INTRAMUSCULAR | Status: DC | PRN
  Administered 2018-01-24: 4 mg via INTRAVENOUS

## 2018-01-24 MED ORDER — ONDANSETRON HCL 4 MG/2ML IJ SOLN
INTRAMUSCULAR | Status: AC
  Filled 2018-01-24: qty 2

## 2018-01-24 MED ORDER — HEPARIN SOD (PORK) LOCK FLUSH 100 UNIT/ML IV SOLN
INTRAVENOUS | Status: AC
  Filled 2018-01-24: qty 5

## 2018-01-24 MED ORDER — HEPARIN SOD (PORK) LOCK FLUSH 100 UNIT/ML IV SOLN
INTRAVENOUS | Status: DC | PRN
  Administered 2018-01-24 (×2): 500 [IU]

## 2018-01-24 MED ORDER — SODIUM CHLORIDE 0.9 % IV SOLN
INTRAVENOUS | Status: DC | PRN
  Administered 2018-01-24 (×2): via INTRAVENOUS

## 2018-01-24 MED ORDER — OXYCODONE HCL 5 MG PO TABS: 5.0000 mg | ORAL_TABLET | Freq: Four times a day (QID) | ORAL | 0 refills | Status: DC | PRN

## 2018-01-24 MED ORDER — FENTANYL CITRATE (PF) 250 MCG/5ML IJ SOLN
INTRAMUSCULAR | Status: AC
Start: 2018-01-24 — End: ?
  Filled 2018-01-24: qty 5

## 2018-01-24 SURGICAL SUPPLY — 47 items
ADH SKN CLS APL DERMABOND .7 (GAUZE/BANDAGES/DRESSINGS) ×1
BAG DECANTER FOR FLEXI CONT (MISCELLANEOUS) ×2 IMPLANT
BLADE HEX COATED 2.75 (ELECTRODE) ×2 IMPLANT
BLADE SURG 11 STRL SS (BLADE) ×2 IMPLANT
BLADE SURG 15 STRL LF DISP TIS (BLADE) ×1 IMPLANT
BLADE SURG 15 STRL SS (BLADE) ×2
CANISTER SUCT 3000ML PPV (MISCELLANEOUS) IMPLANT
CHLORAPREP W/TINT 10.5 ML (MISCELLANEOUS) ×2 IMPLANT
COVER SURGICAL LIGHT HANDLE (MISCELLANEOUS) ×2 IMPLANT
COVER TRANSDUCER ULTRASND GEL (DRAPE) IMPLANT
CRADLE DONUT ADULT HEAD (MISCELLANEOUS) ×2 IMPLANT
DECANTER SPIKE VIAL GLASS SM (MISCELLANEOUS) ×4 IMPLANT
DERMABOND ADVANCED (GAUZE/BANDAGES/DRESSINGS) ×1
DERMABOND ADVANCED .7 DNX12 (GAUZE/BANDAGES/DRESSINGS) ×1 IMPLANT
DRAPE C-ARM 42X72 X-RAY (DRAPES) ×2 IMPLANT
DRAPE CHEST BREAST 15X10 FENES (DRAPES) ×2 IMPLANT
DRAPE UTILITY XL STRL (DRAPES) ×4 IMPLANT
DRAPE WARM FLUID 44X44 (DRAPE) IMPLANT
ELECT REM PT RETURN 9FT ADLT (ELECTROSURGICAL) ×2
ELECTRODE REM PT RTRN 9FT ADLT (ELECTROSURGICAL) ×1 IMPLANT
GAUZE SPONGE 4X4 16PLY XRAY LF (GAUZE/BANDAGES/DRESSINGS) ×2 IMPLANT
GEL ULTRASOUND 20GR AQUASONIC (MISCELLANEOUS) IMPLANT
GLOVE BIO SURGEON STRL SZ 6 (GLOVE) ×2 IMPLANT
GLOVE INDICATOR 6.5 STRL GRN (GLOVE) ×2 IMPLANT
GOWN STRL REUS W/ TWL LRG LVL3 (GOWN DISPOSABLE) ×1 IMPLANT
GOWN STRL REUS W/TWL 2XL LVL3 (GOWN DISPOSABLE) ×2 IMPLANT
GOWN STRL REUS W/TWL LRG LVL3 (GOWN DISPOSABLE) ×2
KIT BASIN OR (CUSTOM PROCEDURE TRAY) ×2 IMPLANT
KIT PORT POWER 8FR ISP CVUE (Port) ×1 IMPLANT
KIT TURNOVER KIT B (KITS) ×2 IMPLANT
NDL HYPO 25GX1X1/2 BEV (NEEDLE) ×1 IMPLANT
NEEDLE HYPO 25GX1X1/2 BEV (NEEDLE) ×2 IMPLANT
NS IRRIG 1000ML POUR BTL (IV SOLUTION) ×2 IMPLANT
PACK SURGICAL SETUP 50X90 (CUSTOM PROCEDURE TRAY) ×2 IMPLANT
PAD ARMBOARD 7.5X6 YLW CONV (MISCELLANEOUS) ×2 IMPLANT
PENCIL BUTTON HOLSTER BLD 10FT (ELECTRODE) ×2 IMPLANT
SUT MON AB 4-0 PC3 18 (SUTURE) ×2 IMPLANT
SUT PROLENE 2 0 SH DA (SUTURE) ×4 IMPLANT
SUT VIC AB 3-0 SH 27 (SUTURE) ×2
SUT VIC AB 3-0 SH 27X BRD (SUTURE) ×1 IMPLANT
SYR 20ML ECCENTRIC (SYRINGE) ×4 IMPLANT
SYR 5ML LUER SLIP (SYRINGE) ×2 IMPLANT
SYR CONTROL 10ML LL (SYRINGE) ×2 IMPLANT
TOWEL OR 17X24 6PK STRL BLUE (TOWEL DISPOSABLE) ×2 IMPLANT
TOWEL OR 17X26 10 PK STRL BLUE (TOWEL DISPOSABLE) ×2 IMPLANT
TUBE CONNECTING 12X1/4 (SUCTIONS) IMPLANT
YANKAUER SUCT BULB TIP NO VENT (SUCTIONS) IMPLANT

## 2018-01-24 NOTE — Op Note (Signed)
PREOPERATIVE DIAGNOSIS:  Gastric cancer     POSTOPERATIVE DIAGNOSIS:  Same     PROCEDURE: Left subclavian port placement, Bard ClearVue Power Port, MRI safe, 8-French.      SURGEON:  [name], MD      ANESTHESIA:  General   FINDINGS:  Good venous return, easy flush, and tip of the catheter and   SVC 19 cm.      SPECIMEN:  None.      ESTIMATED BLOOD LOSS:  Minimal.      COMPLICATIONS:  None known.      PROCEDURE:  Pt was identified in the holding area and taken to   the operating room, where patient was placed supine on the operating room   table.  General anesthesia was induced.  Patient's arms were tucked and the upper   chest and neck were prepped and draped in sterile fashion.  Time-out was   performed according to the surgical safety check list.  When all was   correct, we continued.   Local anesthetic was administered over this   area at the angle of the clavicle.  The vein was accessed with 3 pass(es) of the needle. There was good venous return and the wire passed easily with no ectopy.   Fluoroscopy was used to confirm that the wire was in the vena cava.      The patient was placed back level and the area for the pocket was anethetized   with local anesthetic.  A 3-cm transverse incision was made with a #15   blade.  Cautery was used to divide the subcutaneous tissues down to the   pectoralis muscle.  An [employer] retractor was used to elevate the skin   while a pocket was created on top of the pectoralis fascia.  The port   was placed into the pocket to confirm that it was of adequate size.  The   catheter was preattached to the port.  The port was then secured to the   pectoralis fascia with four 2-0 Prolene sutures.  These were clamped and   not tied down yet.    The catheter was tunneled through to the wire exit   site.  The catheter was placed along the wire to determine what length it should be to be in the SVC.  The catheter was cut at 19 cm.  The tunneler  sheath and dilator were passed over the wire and the dilator and wire were removed.  The catheter was advanced through the tunneler sheath and the tunneler sheath was pulled away.  Care was taken to keep the catheter in the tunneler sheath as this occurred. This was advanced and the tunneler sheath was removed.  There was good venous   return and easy flush of the catheter.  The Prolene sutures were tied   down to the pectoral fascia.  The skin was reapproximated using 3-0   Vicryl interrupted deep dermal sutures.    Fluoroscopy was used to re-confirm good position of the catheter.  The skin   was then closed using 4-0 Monocryl in a subcuticular fashion.  The port was flushed with concentrated heparin flush as well.  The wounds were then cleaned, dried, and dressed with Dermabond.  The patient was awakened from anesthesia and taken to the PACU in stable condition.  Needle, sponge, and instrument counts were correct.               [name], MD

## 2018-01-24 NOTE — Anesthesia Preprocedure Evaluation (Signed)
Anesthesia Evaluation  Patient identified by MRN, date of birth, ID band Patient awake    Reviewed: Allergy & Precautions, NPO status , Patient's Chart, lab work & pertinent test results  Airway Mallampati: II  TM Distance: >3 FB     Dental   Pulmonary Current Smoker,    breath sounds clear to auscultation       Cardiovascular hypertension, + CAD and + Peripheral Vascular Disease   Rhythm:Regular Rate:Normal     Neuro/Psych    GI/Hepatic Neg liver ROS, GERD  ,  Endo/Other    Renal/GU Renal disease     Musculoskeletal   Abdominal   Peds  Hematology   Anesthesia Other Findings   Reproductive/Obstetrics                             Anesthesia Physical Anesthesia Plan  ASA: III  Anesthesia Plan: General   Post-op Pain Management:    Induction: Intravenous  PONV Risk Score and Plan: Treatment may vary due to age or medical condition  Airway Management Planned: LMA  Additional Equipment:   Intra-op Plan:   Post-operative Plan:   Informed Consent: I have reviewed the patients History and Physical, chart, labs and discussed the procedure including the risks, benefits and alternatives for the proposed anesthesia with the patient or authorized representative who has indicated his/her understanding and acceptance.   Dental advisory given  Plan Discussed with: CRNA and Anesthesiologist  Anesthesia Plan Comments:         Anesthesia Quick Evaluation

## 2018-01-24 NOTE — Discharge Instructions (Addendum)
[facility] °Office Phone Number [phone] ° ° POST OP INSTRUCTIONS ° °Always review your discharge instruction sheet given to you by the facility where your surgery was performed. ° °IF YOU HAVE DISABILITY OR FAMILY LEAVE FORMS, YOU MUST BRING THEM TO THE OFFICE FOR PROCESSING.  DO NOT GIVE THEM TO YOUR DOCTOR. ° °1. A prescription for pain medication may be given to you upon discharge.  Take your pain medication as prescribed, if needed.  If narcotic pain medicine is not needed, then you may take acetaminophen (Tylenol) or ibuprofen (Advil) as needed. °2. Take your usually prescribed medications unless otherwise directed °3. If you need a refill on your pain medication, please contact your pharmacy.  They will contact our office to request authorization.  Prescriptions will not be filled after 5pm or on week-ends. °4. You should eat very light the first 24 hours after surgery, such as soup, crackers, pudding, etc.  Resume your normal diet the day after surgery °5. It is common to experience some constipation if taking pain medication after surgery.  Increasing fluid intake and taking a stool softener will usually help or prevent this problem from occurring.  A mild laxative (Milk of Magnesia or Miralax) should be taken according to package directions if there are no bowel movements after 48 hours. °6. You may shower in 48 hours.  The surgical glue will flake off in 2-3 weeks.   °7. ACTIVITIES:  No strenuous activity or heavy lifting for 1 week.   °a. You may drive when you no longer are taking prescription pain medication, you can comfortably wear a seatbelt, and you can safely maneuver your car and apply brakes. °b. RETURN TO WORK:  __________to be determined._______________ °You should see your doctor in the office for a follow-up appointment approximately three-four weeks after your surgery.   ° °WHEN TO CALL YOUR DOCTOR: °1. Fever over 101.0 °2. Nausea and/or vomiting. °3. Extreme swelling  or bruising. °4. Continued bleeding from incision. °5. Increased pain, redness, or drainage from the incision. ° °The clinic staff is available to answer your questions during regular business hours.  Please don’t hesitate to call and ask to speak to one of the nurses for clinical concerns.  If you have a medical emergency, go to the nearest emergency room or call 911.  A surgeon from [facility] is always on call at the hospital. ° °For further questions, please visit [email or web address]  ° °

## 2018-01-24 NOTE — Interval H&P Note (Signed)
History and Physical Interval Note:  01/24/2018 7:41 AM  Somerset Cellar  has presented today for surgery, with the diagnosis of Gastric cancer  The various methods of treatment have been discussed with the patient and family. After consideration of risks, benefits and other options for treatment, the patient has consented to  Procedure(s): INSERTION PORT-A-CATH (N/A) as a surgical intervention .  The patient's history has been reviewed, patient examined, no change in status, stable for surgery.  I have reviewed the patient's chart and labs.  Questions were answered to the patient's satisfaction.     [name]

## 2018-01-24 NOTE — Anesthesia Postprocedure Evaluation (Signed)
Anesthesia Post Note  Patient: Todd [name]  Procedure(s) Performed: INSERTION PORT-A-CATH (N/A Chest)     Patient location during evaluation: PACU Anesthesia Type: General Level of consciousness: awake Pain management: pain level controlled Vital Signs Assessment: post-procedure vital signs reviewed and stable Respiratory status: spontaneous breathing Cardiovascular status: stable Anesthetic complications: no    Last Vitals:  Vitals:   01/24/18 1000 01/24/18 1015  BP: 120/90 120/90  Pulse: 62 78  Resp: 16 16  Temp: 36.5 C   SpO2: 99% 99%    Last Pain:  Vitals:   01/24/18 1015  TempSrc:   PainSc: 0-No pain                 Harneet Noblett

## 2018-01-24 NOTE — Transfer of Care (Signed)
Immediate Anesthesia Transfer of Care Note  Patient: Todd [name]  Procedure(s) Performed: INSERTION PORT-A-CATH (N/A Chest)  Patient Location: PACU  Anesthesia Type:General  Level of Consciousness: responds to stimulation  Airway & Oxygen Therapy: Patient Spontanous Breathing and Patient connected to face mask oxygen, OPA  Post-op Assessment: Report given to RN and Post -op Vital signs reviewed and stable  Post vital signs: Reviewed and stable  Last Vitals:  Vitals Value Taken Time  BP 122/80 01/24/2018  8:51 AM  Temp    Pulse 69 01/24/2018  8:52 AM  Resp 16 01/24/2018  8:52 AM  SpO2 99 % 01/24/2018  8:52 AM  Vitals shown include unvalidated device data.  Last Pain:  Vitals:   01/24/18 0634  TempSrc:   PainSc: 0-No pain         Complications: No apparent anesthesia complications

## 2018-01-25 ENCOUNTER — Encounter (HOSPITAL_COMMUNITY): Payer: Self-pay | Admitting: General Surgery

## 2018-01-26 ENCOUNTER — Encounter (HOSPITAL_COMMUNITY): Payer: Self-pay | Admitting: Emergency Medicine

## 2018-01-26 MED ORDER — ONDANSETRON HCL 8 MG PO TABS: 8.0000 mg | ORAL_TABLET | Freq: Two times a day (BID) | ORAL | 1 refills | Status: DC | PRN

## 2018-01-26 MED ORDER — DEXAMETHASONE 4 MG PO TABS: 8.0000 mg | ORAL_TABLET | Freq: Every day | ORAL | 1 refills | Status: DC

## 2018-01-26 MED ORDER — LIDOCAINE-PRILOCAINE 2.5-2.5 % EX CREA: TOPICAL_CREAM | CUTANEOUS | 3 refills | Status: DC

## 2018-01-26 MED ORDER — PROCHLORPERAZINE MALEATE 10 MG PO TABS: 10.0000 mg | ORAL_TABLET | Freq: Four times a day (QID) | ORAL | 1 refills | Status: DC | PRN

## 2018-01-26 NOTE — Patient Instructions (Signed)
Todd [name]   CHEMOTHERAPY INSTRUCTIONS  We are going to treat you with the regimen FLOT.  This regimen includes chemotherapies 5FU, oxaliplatin, leucovorin, and taxotere.  1 cycle is every 2 weeks.  You will have a pump that you take home for 24 hours.  You will see the doctor regularly throughout treatment.  You will have 4 cycles then back to see Dr Barry Dienes for surgery.  We monitor your lab work prior to every treatment.  The doctor monitors your response to treatment by the way you are feeling, your blood work, and scans periodically.  There will be wait times while you are here for treatment.  It will take about 30 minutes to 1 hour for lab work to result.  Then pharmacy has to mix your medications.   You will have pre-medications that you receive prior to each chemotherapy: Premeds: Aloxi - high powered nausea/vomiting prevention medication used for chemotherapy patients.  Dexamethasone - steroid - given to reduce the risk of you having an allergic type reaction to the chemotherapy. Dex can cause you to feel energized, nervous/anxious/jittery, make you have trouble sleeping, and/or make you feel hot/flushed in the face/neck and/or look pink/red in the face/neck. These side effects will pass as the Dex wears off. (takes 20 minutes to infuse)  **From the time the pre-medication are administered it will be 30 minutes until chemotherapy can be administered.     POTENTIAL SIDE EFFECTS OF TREATMENT: 5-Fluorouracil (Adrucil)  About This Drug Fluorouracil is used to treat cancer. It is given in the vein (IV).  This is the drug that will go home with you in your ambulatory pump for 24 hours.  Possible Side Effects . Hair loss. Hair loss is often temporary, although with certain medicine, hair loss can sometimes be permanent. Hair loss may happen suddenly or gradually. If you lose hair, you may lose it from your head, face, armpits, pubic area, chest, and/or legs.  You may also notice your hair getting thin. . Changes in your nail color, nail loss and/or brittle nail . Darkening of the skin, or changes to the color of your skin and/or veins used for infusion . Rash, itching . Nausea and throwing up (vomiting) . Loose bowel movements (diarrhea) . Ulcers - sores that may cause pain or bleeding in your digestive tract, which includes your mouth, esophagus, stomach, small/large intestines and rectum . Soreness of the mouth and throat. You may have red areas, white patches, or sores that hurt. . Decreased appetite (decreased hunger) . Changes in the tissue of the heart and/or heart attack. Some changes may happen that can cause your heart to have less ability to pump blood. . Bone marrow depression. This is a decrease in the number of white blood cells, red blood cells, and platelets. This may raise your risk of infection, make you tired and weak (fatigue), and raise your risk of bleeding . Sensitivity to light (photosensitivity). Photosensitivity means that you may become more sensitive to the sun and/or light. Your eyes may water more, mostly in bright light. . Allergic reaction: Allergic reactions, including anaphylaxis are rare but may happen in some patients. Signs of allergic reaction to this drug may be swelling of the face, feeling like your tongue or throat are swelling, trouble breathing, rash, itching, fever, chills, feeling dizzy, and/or feeling that your heart is beating in a fast or not normal way. If this happens, do not take another dose of this drug. You  should get urgent medical treatment. . Blurred vision or other changes in eyesight Note: Not all possible side effects are included above.  Warnings and Precautions . Hand-and-foot syndrome. The palms of your hands or soles of your feet may tingle, become numb, painful, swollen, or red. . Changes in your central nervous system can happen. The central nervous system is made up of your brain and  spinal cord. You could feel extreme tiredness, agitation, confusion, hallucinations (see or hear things that are not there), trouble understanding or speaking, loss of control of your bowels or bladder, eyesight changes, numbness or lack of strength to your arms, legs, face, or body, and coma. If you start to have any of these symptoms let your doctor know right away. . Side effects of this drug may be unexpectedly severe in some patients Note: Some of the side effects above are very rare. If you have concerns and/or questions, please discuss them with your medical team.  Important Information . This drug may be present in the saliva, tears, sweat, urine, stool, vomit, semen, and vaginal secretions. Talk to your doctor and/or your nurse about the necessary precautions to take during this time.  Treating Side Effects . To help with hair loss, wash with a mild shampoo and avoid washing your hair every day. . Avoid rubbing your scalp, pat your hair or scalp dry. . Avoid coloring your hair. . Limit your use of hair spray, electric curlers, blow dryers, and curling irons. . If you are interested in getting a wig, talk to your nurse. You can also call the [facility] at 800-ACS-2345 to find out information about the "Look Good, Feel Better" program close to where you live. It is a free program where women getting chemotherapy can learn about wigs, turbans and scarves as well as makeup techniques and skin and nail care. Marland Kitchen Keeping your nails moisturized may help with brittleness. . To help with itching, moisturize your skin several times day. . Avoid sun exposure and apply sunscreen routinely when outdoors. . If you get a rash do not put anything on it unless your doctor or nurse says you may. Keep the area around the rash clean and dry. Ask your doctor for medicine if your rash bothers you. . To help with decreased appetite, eat small, frequent meals. . Eat high caloric food such as pudding,  ice cream, yogurt and milkshakes. . Drink plenty of fluids (a minimum of eight glasses per day is recommended). . If you throw up or have loose bowel movements, you should drink more fluids so that you do not become dehydrated (lack water in the body from losing too much fluid). . To help with nausea and vomiting, eat small, frequent meals instead of three large meals a day. Choose foods and drinks that are at room temperature. Ask your nurse or doctor about other helpful tips and medicine that is available to help or stop lessen these symptoms. . If you get diarrhea, eat low-fiber foods that are high in protein and calories and avoid foods that can irritate your digestive tracts or lead to cramping. . Ask your nurse or doctor about medicine that can lessen or stop your diarrhea. . Mouth care is very important. Your mouth care should consist of routine, gentle cleaning of your teeth or dentures and rinsing your mouth with a mixture of 1/2 teaspoon of salt in 8 ounces of water or  teaspoon of baking soda in 8 ounces of water. This should be  done at least after each meal and at bedtime. . If you have mouth sores, avoid mouthwash that has alcohol. Also avoid alcohol and smoking because they can bother your mouth and throat. . Manage tiredness by pacing your activities for the day. . Be sure to include periods of rest between energy-draining activities. . To help decrease your risk of infections, wash your hands regularly. . Avoid close contact with people who have a cold, the flu, or other infections. . Use a soft toothbrush. Check with your nurse before using dental floss. . Be very careful when using knives or tools. . Use an electric shaver instead of a razor.  Food and Drug Interactions . There are no known interactions of fluorouracil with food. . Check with your doctor or pharmacist about all other prescription medicines and dietary supplements you are taking before starting this medicine as  there are a lot of known drug interactions with fluorouracil. Also, check with your doctor or pharmacist before starting any new prescription or over-the-counter medicines, or dietary supplement to make sure that there are no interactions.   When to Call the Doctor Call your doctor or nurse if you have any of these symptoms and/or any new or unusual symptoms: . Fever of 100.5 F (38 C) or higher . Chills . Easy bleeding or bruising . Trouble breathing . Feeling dizzy or lightheaded . Feeling that your heart is beating in a fast or not normal way (palpitations) . Chest pain or symptoms of a heart attack. Most heart attacks involve pain in the center of the chest that lasts more than a few minutes. The pain may go away and come back or it can be constant. It can feel like pressure, squeezing, fullness, or pain. Sometimes pain is felt in one or both arms, the back, neck, jaw, or stomach. If any of these symptoms last 2 minutes, call 911. Marland Kitchen Confusion and/or agitation . Hallucinations . Trouble understanding or speaking . Blurry vision or changes in your eyesight . Numbness or lack of strength to your arms, legs, face, or body . Nausea that stops you from eating or drinking and/or is not relieved by prescribed medicines . Throwing up more than 3 times a day . Loose bowel movements (diarrhea) 4 times a day or loose bowel movements with lack of strength or a feeling of being dizzy . Lasting loss of appetite or rapid weight loss of five pounds in a week . Pain in your mouth or throat that makes it hard to eat or drink . Pain along the digestive tract - especially if worse after eating . Blood in your vomit (bright red or coffee-ground) and/or stools (bright red, or black/tarry) . Coughing up blood . Fatigue that interferes with your daily activities . Painful, red, or swollen areas on your hands or feet . Numbness and/or tingling of your hands and/or feet . Signs of allergic reaction: swelling of  the face, feeling like your tongue or throat are swelling, trouble breathing, rash, itching, fever, chills, feeling dizzy, and/or feeling that your heart is beating in a fast or not normal way . If you think you are pregnant or may have impregnated your partner  Reproduction Warnings . Pregnancy warning: This drug may have harmful effects on the unborn baby. Women of child bearing potential should use effective methods of birth control during your cancer treatment. Let your doctor know right away if you think you may be pregnant. . Breastfeeding warning: It is not known if  this drug passes into breast milk. For this reason, women should talk to their doctor about the risks and benefits of breast feeding during treatment with this drug because this drug may enter the breast milk and cause harm to a breast feeding baby. . Fertility warning: In men and women both, this drug may affect your ability to have children in the future. Talk with your doctor or nurse if you plan to have children. Ask for information on sperm or egg banking.  Leucovorin Calcium (Generic Name) Other Names: Leucovorin, Wellcovorin, Folinic Acid, Citrovorum Factor  About This Drug Leucovorin is a vitamin. It is used in combination with other cancer fighting drugs such as 5-fluorouracil and methotrexate. Leucovorin helps 5-fluorouracil kill the cancer cells. It also helps to reduce the side effects of methotrexate. Leucovorin is given in the vein (IV) or by mouth (orally).  Will take 2 hours to infuse.  Possible Side Effects . Rash . Wheezing Leucovorin by itself has very few side effects. Side effects you may have can be caused by the other drugs you are taking, such as 5-fluorouracil or methotrexate.  Allergic Reactions . Allergic reactions to this drug are rare. While you are getting this drug by in your vein (IV), please tell your nurse right away if you have any of these symptoms of an allergic reaction: . Trouble  catching your breath . Feeling like your tongue or throat are swelling . Feeling your heart beat quickly or in a not normal way (palpitations) . Feeling dizzy or lightheaded . Flushing, itching, rash, and/or hives . If you are taking the oral form of leucovorin and you have these symptoms, do not take another dose of this drug and get urgent medical treatment.  Treating Side Effects If you get a rash do not put anything on it unless your doctor or nurse says you may. Keep the area around the rash clean and dry. Ask your doctor for medicine if your rash bothers you.  Food and Drug Interactions There are no known interactions of leucovorin with food. This drug may interact with other medicines. Tell your doctor and pharmacist about all the medicines and dietary supplements (vitamins, minerals, herbs and others) that you are taking at this time. The safety and use of dietary supplements and alternative diets are often not known. Using these might affect your cancer or interfere with your treatment. Until more is known, you should not use dietary supplements or alternative diets without your cancer doctor's help.   When to Call the Doctor . Call your doctor or nurse right away if you have any of these symptoms: . Wheezing or trouble breathing . Rash with or without itching, redness, or hives . If you take leucovorin by mouth, please also call your doctor right away if you have: Marland Kitchen Throwing up (vomiting) . Loose bowel movements (diarrhea)  Reproduction Concerns . Pregnancy warning: It is not known if this drug may harm an unborn child. For this reason, be sure to talk with your doctor if you are pregnant or planning to become pregnant while getting this drug. . Genetic counseling is available for you to talk about the effects of this drug therapy on future pregnancies. Also, a genetic counselor can look at the possible risk of problems in the unborn baby due to this medicine if an exposure happens  during pregnancy. . Breast feeding warning: It is not known if this drug passes into breast milk. For this reason, women should talk to their doctor  about the risks and benefits of breast feeding during treatment with this drug because this drug may enter the breast milk and badly harm a breast feeding baby.   Oxaliplatin (Generic Name) Other Names: EloxatinTM  About This Drug Oxaliplatin is used to treat cancer. It is given in the vein (IV).  Will take 2 hours to infuse and will infuse with the leucovorin.    Possible Side Effects (More Common) . Effects on the nerves are called peripheral neuropathy. You may feel numbness, tingling, or pain in your hands and feet. Cold temperatures can make it worse. Avoid cold drinks with ice. Dress warmly and cover the skin if you go out in the cold. Wear socks or gloves if you have to touch cold objects like cold flooring or items in the refrigerator or freezer. . It may be hard for you to button your clothes, open jars, or walk as usual. The effect on the nerves may get worse with more doses of the drug. These effects get better in some people after the drug is stopped but it does not get better in all people . Bone marrow depression: This is a decrease in the number of white blood cells, red blood cells, and platelets. It may increase your risk for infection, make you tired and weak (fatigue), and raise your risk of bleeding. . Nausea and throwing up (vomiting). These symptoms may happen within a few hours after your treatment and may last up to 72 hours. Medicines are available to stop or lessen these side effects. . Electrolyte changes. Your blood will be checked for electrolyte changes as needed.   Possible Side Effects (Less Common) . Skin and tissue irritation: This may involve redness, pain, warmth, or swelling at the IV site. This happens if the drug leaks out of the vein and into nearby tissue. . Serious allergic reactions including anaphylaxis are  rare. While you are getting this drug in your vein (IV), tell your nurse right away if you have any of these symptoms of an allergic reaction: . Trouble catching your breath. . Feeling like your tongue or throat are swelling. . Feeling your heart beat quickly or in a not normal way (palpitations). . Feeling dizzy or lightheaded. . Flushing, itching, rash, or hives. . Changes in your liver function. Your doctor will check your liver function as needed.  Treating Side Effects . Do not drink cold drinks or use ice cubes in drinks. Drink fluids at room temperature or warmer. Drink through a straw. . Wear gloves to touch cold objects. Be aware that most metals are cold to the touch, especially in winter. Examples are your car door handle and your mail box latch. . Wear warm clothing in cold weather at all times. Cover your mouth and nose with a scarf or a pulldown cap (ski cap) to warm the air that goes to your lungs. . Ask your doctor or nurse about medicine to treat nausea, throwing up, headache, or loose bowel movements. . While you are getting this drug in your vein, tell your nurse right away if you have any pain, redness, or swelling at the site of the IV infusion. . Mouth care is very important. Your mouth care should consist of routine, gentle cleaning of your teeth or dentures and rinsing your mouth with a mixture of 1/2 teaspoon of salt in 8 ounces of water or  teaspoon of baking soda in 8 ounces of water. This should be done at least after each meal  and at bedtime. . Drink 6-8 cups of fluids each day unless your doctor has told you to limit your fluid intake due to some other health problem. A cup is 8 ounces of fluid. If you throw up or have loose bowel movements you should drink more fluids so that you do not become dehydrated (lack water in the body due to losing too much fluid).  Food and Drug Interactions There are no known interactions of oxaliplatin with food. This drug may interact  with other medicines. Tell your doctor and pharmacist about all the medicines and dietary supplements (vitamins, minerals, herbs and others) that you are taking at this time. The safety and use of dietary supplements and alternative diets are often not known. Using these might affect your cancer or interfere with your treatment. Until more is known, you should not use dietary supplements or alternative diets without your cancer doctor's help.  When to Call the Doctor Call your doctor or nurse right away if you have any of these symptoms: . Fever of 100.5 F (38 C) or above . Chills . Easy bruising or bleeding . Wheezing or trouble breathing . Rash or itching . Feeling dizzy or lightheaded . Feeling that your heart is beating in a fast or not normal way (palpitations) . Loose bowel movements (diarrhea) more than 4 times a day or diarrhea with weakness or feeling lightheaded . Blurred vision or other changes in eyesight . Pain when passing urine; blood in urine . Pain in your lower back or side . Feeling confused or agitated . Nausea that stops you from eating or drinking . Throwing up more than 3 times a day . Chest pain or symptoms of a heart attack. Most heart attacks involve pain in the center of the chest that lasts more than a few minutes. The pain may go away and come back. It can feel like pressure, squeezing, fullness, or pain. Sometimes pain is felt in one or both arms, the back, neck, jaw, or stomach. If any of these symptoms last 2 minutes, call 911. Marland Kitchen Symptoms of a stroke such as sudden numbness or weakness of your face, arm, or leg, mostly on one side of your body; sudden confusion, trouble speaking or understanding; sudden trouble seeing in one or both eyes; sudden trouble walking, feeling dizzy, loss of balance or coordination; or sudden, bad headache with no known cause. If you have any of these symptoms for 2 minutes, call 911. . Signs of liver problems: dark urine, pale bowel  movements, bad stomach pain, feeling very tired and weak, itching, or yellowing of the eyes or skin. Call your doctor or nurse as soon as possible if any of these symptoms happen: . Change in hearing, ringing in the ears . Decreased urine . Unusual thirst or passing urine often . Pain in your mouth or throat that makes it hard to eat or drink . Nausea that is not relieved by prescribed medicines . Rash that is not relieved by prescribed medicines . Heavy menstrual period that lasts longer than normal . Numbness, tingling, decreased feeling or weakness in fingers, toes, arms, or legs . Trouble walking or changes in the way you walk, feeling clumsy when buttoning clothes, opening jars, or other routine hand motions . Swelling of legs, ankles, or feet . Weight gain of 5 pounds in one week (fluid retention) . Lasting loss of appetite or rapid weight loss of five pounds in a week . Fatigue that interferes with your daily  activities . Headache that does not go away . Painful, red, or swollen areas on your hands or feet. . No bowel movement for 3 days or you feel uncomfortable . Extreme weakness that interferes with normal activities   Reproduction Concerns Infertility Warning: Sexual problems and reproduction concerns may happen. In both men and women, this drug may affect your ability to have children. This cannot be determined before your treatment. Talk with your doctor or nurse if you plan to have children. Ask for information on sperm or egg banking. In men, this drug may interfere with your ability to make sperm, but it should not change your ability to have sexual relations. In women, menstrual bleeding may become irregular or stop while you are getting this drug. Do not assume that you cannot become pregnant if you do not have a menstrual period. Women may go through signs of menopause (change of life) like vaginal dryness or itching. Vaginal lubricants can be used to lessen vaginal dryness,  itching, and pain during sexual relations. Genetic counseling is available for you to talk about the effects of this drug therapy on future pregnancies. Also, a genetic counselor can look at the possible risk of problems in the unborn baby due to this medicine if an exposure happens during pregnancy. Pregnancy warning: This drug may have harmful effects on the unborn child, so effective methods of birth control should be used during your cancer treatment. Breast feeding warning: It is not known if this drug passes into breast milk. For this reason, women should talk to their doctor about the risks and benefits of breast feeding during treatment with this drug because this drug may enter the breast milk and badly harm a breast feeding baby.   Docetaxel (Generic Name) Other Names: Taxotere  About This Drug Docetaxel is used to treat cancer. This drug is given in the vein (IV).  This will take 1 hour to infuse.  The first time this drug is infused, it will take longer to infuse.  The nurse will be in the room with you for the first 15 minutes, and they will slowly increase the amount of this drug you get and monitor you closely for reactions.    Possible Side Effects (More Common) . Bone marrow depression. This is a decrease in the number of white blood cells, red blood cells, and platelets. This may raise your risk of infection, make you tired and weak (fatigue), and raise your risk of bleeding. . Swelling of your legs, ankles, and/or feet. Steroids are often given to lessen this swelling. . Hair loss: Hair loss is often complete scalp hair loss and can involve loss of eyebrows, eyelashes, and pubic hair. You may notice this a few days or weeks after treatment has started. There have been cases of permanent hair loss reported. . Loose bowel movements (diarrhea) that may last for a few days. . Nausea and throwing up (vomiting). These symptoms may happen within a few hours after your treatment and may  last up to 24 hours. Medicines are available to stop or lessen these side effects. . Soreness of the mouth and throat. You may have red areas, white patches, or sores that hurt. . Effects on the nerves are called peripheral neuropathy. You may feel numbness, tingling, or pain in your hands and feet. It may be hard for you to button your clothes, open jars, or walk as usual. The effect on the nerves may get worse with more doses of the drug.  These effects get better in some people after the drug is stopped but it does not get better in all people. . Skin and nail changes. You may develop a rash or have skin redness and swelling followed by peeling. Nail problems may occur such as changes in nail color, nails becoming thin or brittle, or loss of the nail. Steroids are often given to lessen these side effects. . Weakness that interferes with your daily activities. . This drug contains alcohol and may affect your central nervous system. The central nervous system is made up of your brain and spinal cord. You may feel drunk during and after your treatment and it can impair your ability to drive or use machinery for one to two hours after infusion.  Possible Side Effects (Less Common) . Skin and tissue irritation may involve redness, pain, warmth, or swelling at the IV site. This happens if the drug leaks out of the vein and into nearby tissue. . Changes in your liver function. Your doctor will check your liver function as needed. . Muscle and joint pain. . Effects on the heart: This drug can weaken the heart and lower heart function. Your heart function will be checked as needed. You may have trouble catching your breath, mainly during activities. You may also have trouble breathing while lying down, and have swelling in your ankles. . This drug may cause an increased risk of developing a second cancer. . Skin and tissue irritation may involve redness, pain, warmth, or swelling at the IV site. This happens  if the drug leaks out of the vein and into nearby tissue. Marland Kitchen Blurred vision or other changes in eyesight. . A rare risk of death is increased in people with liver problems. Do not take this drug if you have liver disease and talk to your doctor.  Allergic Reactions Serious allergic reactions, including anaphylaxis are rare. While you are getting this drug in your vein (IV), tell your nurse right away if you have any of these symptoms of an allergic reaction: . Trouble catching your breath . Feeling like your tongue or throat are swelling . Feeling your heart beat quickly or in a not normal way (palpitations) . Feeling dizzy or lightheaded . Flushing, itching, rash, and/or hives  Infusion Reactions While you are getting this drug in your vein (IV), you may have a reaction. Your nurse will check you closely for these signs: fever or shaking chills, flushing, facial swelling, feeling dizzy, headache, trouble breathing, rash, itching, chest tightness, or chest pain. Less serious reactions to this drug may also happen. You will be given medicines to help stop or lessen these symptoms. Your vital signs will be checked during the infusion. Tell your doctor or nurse right away if you have any of these symptoms at any time during the infusion and/or for the first 24 hours after getting this drug: . Fever, chills, or shaking chills . Feeling dizzy or lightheaded . Headache . Nausea or throwing up  Treating Side Effects . Drink 6-8 cups of fluids every day unless your doctor has told you to limit your fluid intake due to some other health problem. A cup is 8 ounces of fluid. If you throw up or have loose bowel . Talk with your nurse about getting a wig before you lose your hair. Also, call the [facility] at 800-ACS-2345 to find out information about the " Look Good,Feel Better" program close to where you live. It is a free program where women  undergoing chemotherapy can learn about wigs,  turbans and scarves as well as makeup techniques and skin and nail care. . Do not put anything on a rash unless your doctor or nurse says you may. Keep the area around the rash clean and dry. Ask your doctor for medicine if your rash bothers you. . Mouth care is very important. Your mouth care should consist of routine, gentle cleaning of your teeth or dentures and rinsing your mouth with a mixture of 1/2 teaspoon of salt in 8 ounces of water or  teaspoon of baking soda in 8 ounces of water. This should be done at least after each meal and at bedtime. . If you have mouth sores, avoid mouthwash that has alcohol. Avoid alcohol and smoking because they can bother your mouth and throat. . Ask your doctor or nurse about medicine to stop or lessen loose bowel movements, nausea, throwing up, and joint and muscle pain. . If you have numbness and tingling in your hands and feet, be careful when cooking, walking, and handling sharp objects and hot liquids.  Food and Drug Interactions There are no known interactions of docetaxel with food. This drug may interact with other medicines. Tell your doctor and pharmacist about all the medicines and dietary supplements (vitamins, minerals, herbs and others) that you are taking at this time. The safety and use of dietary supplements and alternative diets are often not known. Using these might affect your cancer or interfere with your treatment. Until more is known, you should not use dietary supplements or alternative diets without your cancer doctor's help.  When to Call the Doctor Call your doctor or nurse right away if you have any of these symptoms: . Fever of 100.5 F (38 C) or above . Chills . Easy bruising or bleeding . Wheezing or trouble breathing . Rash or itching . Feeling dizzy or lightheaded . Loose bowel movements (diarrhea) more than 4 times a day or diarrhea with weakness or feeling lightheaded . Nausea that stops you from eating or drinking .  Throwing up more than 3 times a day . Signs of liver problems: dark urine, pale bowel movements, bad stomach pain, feeling very tired and weak, unusual itching, or yellowing of the eyes or skin . Eye irritation, blurred vision or other changes in eyesight Call your doctor or nurse as soon as possible if any of these symptoms happen: . Decreased urine . Pain in your mouth or throat that makes it hard to eat or drink . Nausea that is not relieved by prescribed medicines . Rash that is not relieved by prescribed medicines . Numbness, tingling, decreased feeling or weakness in fingers, toes, arms, or legs . Trouble walking or changes in the way you walk, feeling clumsy when buttoning clothes, opening jars, or other routine hand motions . Swelling of legs, ankles, or feet . Weight gain of 5 pounds in one week (fluid retention) . Fatigue that interferes with your daily activities . Headache that does not go away . Extreme weakness that interferes with normal activities . While you are getting this drug, please tell your nurse right away if you have any pain, redness, or swelling at the site of the IV infusion . Symptoms of being drunk, confusion, or being very sleepy  Sexual Problems and Reproductive Concerns . Pregnancy warning: This drug may have harmful effects on the unborn child, so effective methods of birth control should be used during your cancer treatment. Genetic counseling is available for you  to talk about the effects of this drug therapy on future pregnancies. Also, a genetic counselor can look at the possible risk of problems in the unborn baby due to this medicine if an exposure happens during pregnancy. . Breast feeding warning: It is not known if this drug passes into breast milk. For this reason, women should talk to their doctor about the risks and benefits of breast feeding during treatment with this drug because this drug may enter the breast milk and badly harm a breast feeding  baby.    SELF CARE ACTIVITIES WHILE ON CHEMOTHERAPY: Hydration Increase your fluid intake 48 hours prior to treatment and drink at least 8 to 12 cups (64 ounces) of water/decaff beverages per day after treatment. You can still have your cup of coffee or soda but these beverages do not count as part of your 8 to 12 cups that you need to drink daily. No alcohol intake. Medications Continue taking your normal prescription medication as prescribed.  If you start any new herbal or new supplements please let us know first to make sure it is safe.  Mouth Care Have teeth cleaned professionally before starting treatment. Keep dentures and partial plates clean. Use soft toothbrush and do not use mouthwashes that contain alcohol. Biotene is a good mouthwash that is available at most pharmacies or may be ordered by calling [phone]. Use warm salt water gargles (1 teaspoon salt per 1 quart warm water) before and after meals and at bedtime. Or you may rinse with 2 tablespoons of three-percent hydrogen peroxide mixed in eight ounces of water. If you are still having problems with your mouth or sores in your mouth please call the clinic. If you need dental work, please let the doctor know before you go for your appointment so that we can coordinate the best possible time for you in regards to your chemo regimen. You need to also let your dentist know that you are actively taking chemo. We may need to do labs prior to your dental appointment.  Skin Care Always use sunscreen that has not expired and with SPF (Sun Protection Factor) of 50 or higher. Wear hats to protect your head from the sun. Remember to use sunscreen on your hands, ears, face, & feet.  Use good moisturizing lotions such as udder cream, eucerin, or even Vaseline. Some chemotherapies can cause dry skin, color changes in your skin and nails.    . Avoid long, hot showers or baths. . Use gentle, fragrance-free soaps and laundry detergent. . Use  moisturizers, preferably creams or ointments rather than lotions because the thicker consistency is better at preventing skin dehydration. Apply the cream or ointment within 15 minutes of showering. Reapply moisturizer at night, and moisturize your hands every time after you wash them.  Hair Loss (if your doctor says your hair will fall out)  . If your doctor says that your hair is likely to fall out, decide before you begin chemo whether you want to wear a wig. You may want to shop before treatment to match your hair color. . Hats, turbans, and scarves can also camouflage hair loss, although some people prefer to leave their heads uncovered. If you go bare-headed outdoors, be sure to use sunscreen on your scalp. . Cut your hair short. It eases the inconvenience of shedding lots of hair, but it also can reduce the emotional impact of watching your hair fall out. . Don't perm or color your hair during chemotherapy. Those chemical treatments  are already damaging to hair and can enhance hair loss. Once your chemo treatments are done and your hair has grown back, it's OK to resume dyeing or perming hair. With chemotherapy, hair loss is almost always temporary. But when it grows back, it may be a different color or texture. In older adults who still had hair color before chemotherapy, the new growth may be completely gray.  Often, new hair is very fine and soft.    Infection Prevention Please wash your hands for at least 30 seconds using warm soapy water. Handwashing is the #1 way to prevent the spread of germs. Stay away from sick people or people who are getting over a cold. If you develop respiratory systems such as green/yellow mucus production or productive cough or persistent cough let us know and we will see if you need an antibiotic. It is a good idea to keep a pair of gloves on when going into grocery stores/[employer] to decrease your risk of coming into contact with germs on the carts, etc. Carry  alcohol hand gel with you at all times and use it frequently if out in public. If your temperature reaches 100.5 or higher please call the clinic and let us know.  If it is after hours or on the weekend please go to the ER if your temperature is over 100.5.  Please have your own personal thermometer at home to use.    Sex and bodily fluids If you are going to have sex, a condom must be used to protect the person that isn't taking chemotherapy. Chemo can decrease your libido (sex drive). For a few days after chemotherapy, chemotherapy can be excreted through your bodily fluids.  When using the toilet please close the lid and flush the toilet twice.  Do this for a few day after you have had chemotherapy.   Effects of chemotherapy on your sex life Some changes are simple and won't last long. They won't affect your sex life permanently. Sometimes you may feel: . too tired . not strong enough to be very active . sick or sore  . not in the mood . anxious or low Your anxiety might not seem related to sex. For example, you may be worried about the cancer and how your treatment is going. Or you may be worried about money, or about how you family are coping with your illness. These things can cause stress, which can affect your interest in sex. It's important to talk to your partner about how you feel. Remember - the changes to your sex life don't usually last long. There's usually no medical reason to stop having sex during chemo. The drugs won't have any long term physical effects on your performance or enjoyment of sex. Cancer can't be passed on to your partner during sex    Contraception It's important to use reliable contraception during treatment. Avoid getting pregnant while you or your partner are having chemotherapy. This is because the drugs may harm the baby. Sometimes chemotherapy drugs can leave a man or woman infertile.  This means you would not be able to have children in the future. You  might want to talk to someone about permanent infertility. It can be very difficult to learn that you may no longer be able to have children. Some people find counselling helpful. There might be ways to preserve your fertility, although this is easier for men than for women. You may want to speak to a fertility expert. You can talk  about sperm banking or harvesting your eggs. You can also ask about other fertility options, such as donor eggs. If you have or have had breast cancer, your doctor might advise you not to take the contraceptive pill. This is because the hormones in it might affect the cancer.  It is not known for sure whether or not chemotherapy drugs can be passed on through semen or secretions from the vagina. Because of this some doctors advise people to use a barrier method if you have sex during treatment. This applies to vaginal, anal or oral sex. Generally, doctors advise a barrier method only for the time you are actually having the treatment and for about a week after your treatment. Advice like this can be worrying, but this does not mean that you have to avoid being intimate with your partner. You can still have close contact with your partner and continue to enjoy sex.  Animals If you have cats or birds we just ask that you not change the litter or change the cage.  Please have someone else do this for you while you are on chemotherapy.   Food Safety During and After Cancer Treatment Food safety is important for people both during and after cancer treatment. Cancer and cancer treatments, such as chemotherapy, radiation therapy, and stem cell/bone marrow transplantation, often weaken the immune system. This makes it harder for your body to protect itself from foodborne illness, also called food poisoning. Foodborne illness is caused by eating food that contains harmful bacteria, parasites, or viruses.  Foods to avoid Some foods have a higher risk of becoming tainted with bacteria.  These include: Marland Kitchen Unwashed fresh fruit and vegetables, especially leafy vegetables that can hide dirt and other contaminants . Raw sprouts, such as alfalfa sprouts . Raw or undercooked beef, especially ground beef, or other raw or undercooked meat and poultry . Fatty, fried, or spicy foods immediately before or after treatment.  These can sit heavy on your stomach and make you feel nauseous. . Raw or undercooked shellfish, such as oysters. . Sushi and sashimi, which often contain raw fish.  . Unpasteurized beverages, such as unpasteurized fruit juices, raw milk, raw yogurt, or cider . Undercooked eggs, such as soft boiled, over easy, and poached; raw, unpasteurized eggs; or foods made with raw egg, such as homemade raw cookie dough and homemade mayonnaise Simple steps for food safety Shop smart. . Do not buy food stored or displayed in an unclean area. . Do not buy bruised or damaged fruits or vegetables. . Do not buy cans that have cracks, dents, or bulges. . Pick up foods that can spoil at the end of your shopping trip and store them in a cooler on the way home. Prepare and clean up foods carefully. . Rinse all fresh fruits and vegetables under running water, and dry them with a clean towel or paper towel. . Clean the top of cans before opening them. . After preparing food, wash your hands for 20 seconds with hot water and soap. Pay special attention to areas between fingers and under nails. . Clean your utensils and dishes with hot water and soap. Marland Kitchen Disinfect your kitchen and cutting boards using 1 teaspoon of liquid, unscented bleach mixed into 1 quart of water.   Dispose of old food. . Eat canned and packaged food before its expiration date (the "use by" or "best before" date). . Consume refrigerated leftovers within 3 to 4 days. After that time, throw out the food. Even  if the food does not smell or look spoiled, it still may be unsafe. Some bacteria, such as Listeria, can grow even on  foods stored in the refrigerator if they are kept for too long. Take precautions when eating out. . At restaurants, avoid buffets and salad bars where food sits out for a long time and comes in contact with many people. Food can become contaminated when someone with a virus, often a norovirus, or another "bug" handles it. . Put any leftover food in a "to-go" container yourself, rather than having the server do it. And, refrigerate leftovers as soon as you get home. . Choose restaurants that are clean and that are willing to prepare your food as you order it cooked.           MEDICATIONS: Dexamethasone 4 mg tablet:    Take 2 tablets (8 mg total) by mouth daily. Start the day after chemotherapy for 2 days. Take with food.                                                                                                                                                          Zofran/Ondansetron 8mg  tablet. Take 1 tablet every 8 hours as needed for nausea/vomiting. (#1 nausea med to take, this can constipate)  Compazine/Prochlorperazine 10mg  tablet. Take 1 tablet every 6 hours as needed for nausea/vomiting. (#2 nausea med to take, this can make you sleepy)  EMLA cream:  Apply quarter-sized amount 1 hour prior to chemo.  Do not rub in.  Cover with plastic wrap.  Over-the-Counter Meds: Miralax 1 capful in 8 oz of fluid daily. May increase to two times a day if needed. This is a stool softener. If this doesn't work proceed you can add:  Senokot S-start with 1 tablet two times a day and increase to 4 tablets two times a day if needed. (total of 8 tablets in a 24 hour period). This is a stimulant laxative.   Call us if this does not help your bowels move.   Imodium 2mg  capsule. Take 2 capsules after the 1st loose stool and then 1 capsule every 2 hours until you go a total of 12 hours without having a loose stool. Call the [facility] if loose stools continue. If diarrhea occurs @ bedtime, take 2  capsules @ bedtime. Then take 2 capsules every 4 hours until morning. Call [facility].    Constipation Sheet *Miralax in 8 oz of fluid daily.  May increase to two times a day if needed.  This is a stool softener.  If this not enough to keep your bowel regular:  You can add:  *Senokot S, start with one tablet twice a day and can increase to 4 tablets twice a day if needed.  This is a stimulant laxative.  Sometimes when you take pain medication you need BOTH a medicine to keep your stool soft and a medicine to help your bowel push it out!  Please call if the above does not work for you.   Do not go more than 2 days without a bowel movement.  It is very important that you do not become constipated.  It will make you feel sick to your stomach (nausea) and can cause abdominal pain and vomiting.    Diarrhea Sheet  If you are having loose stools/diarrhea, please purchase Imodium and begin taking as outlined:  At the first sign of poorly formed or loose stools you should begin taking Imodium(loperamide) 2 mg capsules.  Take two caplets (4mg ) followed by one caplet (2mg ) every 2 hours until you have had no diarrhea for 12 hours.  During the night take two caplets (4mg ) at bedtime and continue every 4 hours during the night until the morning.  Stop taking Imodium only after there is no sign of diarrhea for 12 hours.    Always call the [facility] if you are having loose stools/diarrhea that you can't get under control.  Loose stools/diarrhea leads to dehydration (loss of water) in your body.  We have other options of trying to get the loose stools/diarrhea to stopped but you must let us know!    Nausea Sheet  Zofran/Ondansetron 8mg  tablet. Take 1 tablet every 8 hours as needed for nausea/vomiting. (#1 nausea med to take, this can constipate)  Compazine/Prochlorperazine 10mg  tablet. Take 1 tablet every 6 hours as needed for nausea/vomiting. (#2 nausea med to take, this can make you  sleepy)  You can take these medications together or separately.  We would first like for you to try the Ondansetron by itself and then take the Prochloperizine if needed. But you are allowed to take both medications at the same time if your nausea is that severe.  If you are having persistent nausea (nausea that does not stop) please take these medications on a staggered schedule so that the nausea medication stays in your body.  Please call the [facility] and let us know the amount of nausea that you are experiencing.  If you begin to vomit, you need to call the [facility] and if it is the weekend and you have vomited more than one time and cant get it to stop-go to the Emergency Room.  Persistent nausea/vomiting can lead to dehydration (loss of fluid in your body) and will make you feel terrible.   Ice chips, sips of clear liquids, foods that are @ room temperature, crackers, and toast tend to be better tolerated.      SYMPTOMS TO REPORT AS SOON AS POSSIBLE AFTER TREATMENT:  FEVER GREATER THAN 100.5 F  CHILLS WITH OR WITHOUT FEVER  NAUSEA AND VOMITING THAT IS NOT CONTROLLED WITH YOUR NAUSEA MEDICATION  UNUSUAL SHORTNESS OF BREATH  UNUSUAL BRUISING OR BLEEDING  TENDERNESS IN MOUTH AND THROAT WITH OR WITHOUT PRESENCE OF ULCERS  URINARY PROBLEMS  BOWEL PROBLEMS  UNUSUAL RASH      Wear comfortable clothing and clothing appropriate for easy access to any Portacath or PICC line. Let us know if there is anything that we can do to make your therapy better!     What to do if you need assistance after hours or on the weekends:  CALL [phone].  HOLD on the line, do not hang up.  You will hear multiple messages but at the end you will be connected  with a nurse triage line.  They will contact the doctor if necessary.  Most of the time they will be able to assist you.  Do not call the hospital operator.     I have been informed and understand all of the instructions given to me  and have received a copy. I have been instructed to call the clinic [phone] or my family physician as soon as possible for continued medical care, if indicated. I do not have any more questions at this time but understand that I may call the [facility] or the Patient Navigator at [phone] during office hours should I have questions or need assistance in obtaining follow-up care.

## 2018-01-26 NOTE — Progress Notes (Signed)
Chemotherapy teaching pulled together.  appts made. 

## 2018-01-31 ENCOUNTER — Inpatient Hospital Stay (HOSPITAL_COMMUNITY): Payer: Medicare Other | Attending: Hematology

## 2018-01-31 ENCOUNTER — Inpatient Hospital Stay (HOSPITAL_COMMUNITY): Payer: Medicare Other

## 2018-01-31 ENCOUNTER — Encounter (HOSPITAL_COMMUNITY): Payer: Self-pay

## 2018-01-31 ENCOUNTER — Other Ambulatory Visit: Payer: Self-pay

## 2018-01-31 VITALS — BP 135/81 | HR 75 | Temp 98.6°F | Resp 16 | Wt 197.9 lb

## 2018-01-31 DIAGNOSIS — Z5189 Encounter for other specified aftercare: Secondary | ICD-10-CM | POA: Diagnosis not present

## 2018-01-31 DIAGNOSIS — C169 Malignant neoplasm of stomach, unspecified: Secondary | ICD-10-CM | POA: Insufficient documentation

## 2018-01-31 DIAGNOSIS — Z5111 Encounter for antineoplastic chemotherapy: Secondary | ICD-10-CM | POA: Insufficient documentation

## 2018-01-31 DIAGNOSIS — F17210 Nicotine dependence, cigarettes, uncomplicated: Secondary | ICD-10-CM | POA: Diagnosis not present

## 2018-01-31 DIAGNOSIS — Z800 Family history of malignant neoplasm of digestive organs: Secondary | ICD-10-CM | POA: Insufficient documentation

## 2018-01-31 DIAGNOSIS — Z7982 Long term (current) use of aspirin: Secondary | ICD-10-CM | POA: Insufficient documentation

## 2018-01-31 DIAGNOSIS — Z809 Family history of malignant neoplasm, unspecified: Secondary | ICD-10-CM | POA: Diagnosis not present

## 2018-01-31 DIAGNOSIS — I700 Atherosclerosis of aorta: Secondary | ICD-10-CM | POA: Diagnosis not present

## 2018-01-31 DIAGNOSIS — Z79899 Other long term (current) drug therapy: Secondary | ICD-10-CM | POA: Diagnosis not present

## 2018-01-31 DIAGNOSIS — Z452 Encounter for adjustment and management of vascular access device: Secondary | ICD-10-CM | POA: Insufficient documentation

## 2018-01-31 DIAGNOSIS — I714 Abdominal aortic aneurysm, without rupture: Secondary | ICD-10-CM | POA: Insufficient documentation

## 2018-01-31 DIAGNOSIS — I10 Essential (primary) hypertension: Secondary | ICD-10-CM | POA: Insufficient documentation

## 2018-01-31 DIAGNOSIS — K521 Toxic gastroenteritis and colitis: Secondary | ICD-10-CM | POA: Insufficient documentation

## 2018-01-31 LAB — COMPREHENSIVE METABOLIC PANEL
ALT: 12 U/L — AB (ref 17–63)
AST: 15 U/L (ref 15–41)
Albumin: 3.6 g/dL (ref 3.5–5.0)
Alkaline Phosphatase: 52 U/L (ref 38–126)
Anion gap: 6 (ref 5–15)
BILIRUBIN TOTAL: 0.6 mg/dL (ref 0.3–1.2)
BUN: 12 mg/dL (ref 6–20)
CHLORIDE: 102 mmol/L (ref 101–111)
CO2: 28 mmol/L (ref 22–32)
Calcium: 9.1 mg/dL (ref 8.9–10.3)
Creatinine, Ser: 1.1 mg/dL (ref 0.61–1.24)
GFR calc Af Amer: >60 mL/min (ref >60)
Glucose, Bld: 88 mg/dL (ref 65–99)
Potassium: 3.5 mmol/L (ref 3.5–5.1)
SODIUM: 136 mmol/L (ref 135–145)
Total Protein: 7.3 g/dL (ref 6.5–8.1)

## 2018-01-31 LAB — CBC WITH DIFFERENTIAL/PLATELET
BASOS ABS: 0 10*3/uL (ref 0.0–0.1)
Basophils Relative: 0 %
Eosinophils Absolute: 0.2 10*3/uL (ref 0.0–0.7)
Eosinophils Relative: 2 %
HCT: 36.3 % — ABNORMAL LOW (ref 39.0–52.0)
HEMOGLOBIN: 12.3 g/dL — AB (ref 13.0–17.0)
LYMPHS PCT: 25 %
Lymphs Abs: 2.9 10*3/uL (ref 0.7–4.0)
MCH: 29.6 pg (ref 26.0–34.0)
MCHC: 33.9 g/dL (ref 30.0–36.0)
MCV: 87.3 fL (ref 78.0–100.0)
Monocytes Absolute: 0.6 10*3/uL (ref 0.1–1.0)
Monocytes Relative: 5 %
NEUTROS PCT: 68 %
Neutro Abs: 8 10*3/uL — ABNORMAL HIGH (ref 1.7–7.7)
Platelets: 212 10*3/uL (ref 150–400)
RBC: 4.16 MIL/uL — AB (ref 4.22–5.81)
RDW: 14.6 % (ref 11.5–15.5)
WBC: 11.7 10*3/uL — AB (ref 4.0–10.5)

## 2018-01-31 LAB — FERRITIN: Ferritin: 20 ng/mL — ABNORMAL LOW (ref 24–336)

## 2018-01-31 LAB — LACTATE DEHYDROGENASE: LDH: 155 U/L (ref 98–192)

## 2018-01-31 MED ORDER — PALONOSETRON HCL INJECTION 0.25 MG/5ML
INTRAVENOUS | Status: AC
  Filled 2018-01-31: qty 5

## 2018-01-31 MED ORDER — SODIUM CHLORIDE 0.9 % IV SOLN
50.0000 mg/m2 | Freq: Once | INTRAVENOUS | Status: AC
  Administered 2018-01-31: 110 mg via INTRAVENOUS
  Filled 2018-01-31: qty 11

## 2018-01-31 MED ORDER — SODIUM CHLORIDE 0.9% FLUSH
10.0000 mL | INTRAVENOUS | Status: DC | PRN
  Administered 2018-01-31: 10 mL
  Filled 2018-01-31: qty 10

## 2018-01-31 MED ORDER — DEXAMETHASONE SODIUM PHOSPHATE 10 MG/ML IJ SOLN
INTRAMUSCULAR | Status: AC
  Filled 2018-01-31: qty 1

## 2018-01-31 MED ORDER — PALONOSETRON HCL INJECTION 0.25 MG/5ML
0.2500 mg | Freq: Once | INTRAVENOUS | Status: AC
  Administered 2018-01-31: 0.25 mg via INTRAVENOUS

## 2018-01-31 MED ORDER — DEXAMETHASONE SODIUM PHOSPHATE 100 MG/10ML IJ SOLN: 10.0000 mg | Freq: Once | INTRAMUSCULAR | Status: DC

## 2018-01-31 MED ORDER — OXALIPLATIN CHEMO INJECTION 100 MG/20ML
85.0000 mg/m2 | Freq: Once | INTRAVENOUS | Status: AC
  Administered 2018-01-31: 180 mg via INTRAVENOUS
  Filled 2018-01-31: qty 16

## 2018-01-31 MED ORDER — DEXAMETHASONE SODIUM PHOSPHATE 10 MG/ML IJ SOLN
10.0000 mg | Freq: Once | INTRAMUSCULAR | Status: AC
  Administered 2018-01-31: 10 mg via INTRAVENOUS

## 2018-01-31 MED ORDER — LEUCOVORIN CALCIUM INJECTION 350 MG
400.0000 mg | Freq: Once | INTRAVENOUS | Status: AC
  Administered 2018-01-31: 400 mg via INTRAVENOUS
  Filled 2018-01-31: qty 17.5

## 2018-01-31 MED ORDER — SODIUM CHLORIDE 0.9 % IV SOLN
2600.0000 mg/m2 | INTRAVENOUS | Status: AC
  Administered 2018-01-31: 5550 mg via INTRAVENOUS
  Filled 2018-01-31: qty 101

## 2018-01-31 MED ORDER — HEPARIN SOD (PORK) LOCK FLUSH 100 UNIT/ML IV SOLN: 500.0000 [IU] | Freq: Once | INTRAVENOUS | Status: DC | PRN

## 2018-01-31 MED ORDER — SODIUM CHLORIDE 0.9 % IV SOLN
INTRAVENOUS | Status: DC
  Administered 2018-01-31: 10:00:00 via INTRAVENOUS

## 2018-01-31 MED ORDER — DEXTROSE 5 % IV SOLN
Freq: Once | INTRAVENOUS | Status: AC
  Administered 2018-01-31: 13:00:00 via INTRAVENOUS

## 2018-01-31 NOTE — Progress Notes (Signed)
Labs reviewed by Dr.Higgs. Proceed with treatment today.  1050 Taxotere started, vitals obtained pre and per protocol.   Continuous pump education done today. Video and consent done on infu-system ipad. Coninuous 5FU pump connected.   Treatment given per orders. Patient tolerated it well without problems. Vitals stable and discharged home from clinic ambulatory. Follow up as scheduled.

## 2018-01-31 NOTE — Patient Instructions (Signed)
[facility] Discharge Instructions for Patients Receiving Chemotherapy   Beginning January 23rd 2017 lab work for the Cancer Center will be done in the  Main lab at  on 1st floor. If you have a lab appointment with the Cancer Center please come in thru the  Main Entrance and check in at the main information desk   Today you received the following chemotherapy agents   To help prevent nausea and vomiting after your treatment, we encourage you to take your nausea medication     If you develop nausea and vomiting, or diarrhea that is not controlled by your medication, call the clinic.  The clinic phone number is [phone]. Office hours are Monday-Friday 8:30am-5:00pm.  BELOW ARE SYMPTOMS THAT SHOULD BE REPORTED IMMEDIATELY:  *FEVER GREATER THAN 101.0 F  *CHILLS WITH OR WITHOUT FEVER  NAUSEA AND VOMITING THAT IS NOT CONTROLLED WITH YOUR NAUSEA MEDICATION  *UNUSUAL SHORTNESS OF BREATH  *UNUSUAL BRUISING OR BLEEDING  TENDERNESS IN MOUTH AND THROAT WITH OR WITHOUT PRESENCE OF ULCERS  *URINARY PROBLEMS  *BOWEL PROBLEMS  UNUSUAL RASH Items with * indicate a potential emergency and should be followed up as soon as possible. If you have an emergency after office hours please contact your primary care physician or go to the nearest emergency department.  Please call the clinic during office hours if you have any questions or concerns.   You may also contact the Patient Navigator at [phone] should you have any questions or need assistance in obtaining follow up care.      Resources For Cancer Patients and their Caregivers ? American Cancer Society: Can assist with transportation, wigs, general needs, runs Look Good Feel Better.        1-888-227-6333 ? Cancer Care: Provides financial assistance, online support groups, medication/co-pay assistance.  1-800-813-HOPE (4673) ? [facility] Assists [place] cancer  patients and their families through emotional , educational and financial support.  336-427-4357 ? [place] DSS Where to apply for food stamps, Medicaid and utility assistance. 336-342-1394 ? [facility]: Transportation to medical appointments. 336-347-2287 ? Social Security Administration: May apply for disability if have a Stage IV cancer. 336-342-7796 1-800-772-1213 ? [place] [facility], Disability and Transit Services: Assists with nutrition, care and transit needs. 336-349-2343         

## 2018-01-31 NOTE — Progress Notes (Signed)
Chemotherapy teaching completed.  Consent signed.  Extensive teaching packet given.   

## 2018-01-31 NOTE — Progress Notes (Signed)
[facility] CSW Progress Notes  Brief visit/assessment w patient while in infusion.  Patient is new to chemotherapy.  Has support person accompanying him.  No concerns re transportation or help at home if needed.  No current issues.  Stable housing, lives at [place].  Has access to food but little appetite, may like to see nutritionist for additional assistance.  Provided May calendar and encouraged him to participate in support group if desired.  Edwyna Shell, LCSW Clinical Social Worker Phone:  [phone]

## 2018-02-01 ENCOUNTER — Inpatient Hospital Stay (HOSPITAL_COMMUNITY): Payer: Medicare Other

## 2018-02-01 ENCOUNTER — Encounter (HOSPITAL_COMMUNITY): Payer: Self-pay

## 2018-02-01 VITALS — BP 130/80 | HR 84 | Temp 98.4°F | Resp 18

## 2018-02-01 DIAGNOSIS — Z5189 Encounter for other specified aftercare: Secondary | ICD-10-CM | POA: Diagnosis not present

## 2018-02-01 DIAGNOSIS — C169 Malignant neoplasm of stomach, unspecified: Secondary | ICD-10-CM

## 2018-02-01 DIAGNOSIS — Z809 Family history of malignant neoplasm, unspecified: Secondary | ICD-10-CM | POA: Diagnosis not present

## 2018-02-01 DIAGNOSIS — Z7982 Long term (current) use of aspirin: Secondary | ICD-10-CM | POA: Diagnosis not present

## 2018-02-01 DIAGNOSIS — I10 Essential (primary) hypertension: Secondary | ICD-10-CM | POA: Diagnosis not present

## 2018-02-01 DIAGNOSIS — I714 Abdominal aortic aneurysm, without rupture: Secondary | ICD-10-CM | POA: Diagnosis not present

## 2018-02-01 DIAGNOSIS — K521 Toxic gastroenteritis and colitis: Secondary | ICD-10-CM | POA: Diagnosis not present

## 2018-02-01 DIAGNOSIS — Z800 Family history of malignant neoplasm of digestive organs: Secondary | ICD-10-CM | POA: Diagnosis not present

## 2018-02-01 DIAGNOSIS — I700 Atherosclerosis of aorta: Secondary | ICD-10-CM | POA: Diagnosis not present

## 2018-02-01 DIAGNOSIS — Z79899 Other long term (current) drug therapy: Secondary | ICD-10-CM | POA: Diagnosis not present

## 2018-02-01 DIAGNOSIS — Z452 Encounter for adjustment and management of vascular access device: Secondary | ICD-10-CM | POA: Diagnosis not present

## 2018-02-01 DIAGNOSIS — Z5111 Encounter for antineoplastic chemotherapy: Secondary | ICD-10-CM | POA: Diagnosis not present

## 2018-02-01 LAB — CEA: CEA1: 11.6 ng/mL — AB (ref 0.0–4.7)

## 2018-02-01 MED ORDER — PEGFILGRASTIM 6 MG/0.6ML ~~LOC~~ PSKT
PREFILLED_SYRINGE | SUBCUTANEOUS | Status: AC
  Filled 2018-02-01: qty 0.6

## 2018-02-01 MED ORDER — SODIUM CHLORIDE 0.9% FLUSH
10.0000 mL | INTRAVENOUS | Status: DC | PRN
  Administered 2018-02-01: 10 mL
  Filled 2018-02-01: qty 10

## 2018-02-01 MED ORDER — PEGFILGRASTIM 6 MG/0.6ML ~~LOC~~ PSKT
6.0000 mg | PREFILLED_SYRINGE | Freq: Once | SUBCUTANEOUS | Status: AC
  Administered 2018-02-01: 6 mg via SUBCUTANEOUS

## 2018-02-01 MED ORDER — HEPARIN SOD (PORK) LOCK FLUSH 100 UNIT/ML IV SOLN
500.0000 [IU] | Freq: Once | INTRAVENOUS | Status: AC | PRN
  Administered 2018-02-01: 500 [IU]

## 2018-02-01 NOTE — Progress Notes (Signed)
Todd [name] tolerated 5FU pump discontinuation with Neulasta on-pro application well without complaints or incident. Pt instructed in purpose,side effects and instructions for the Neulasta and pt verbalized understanding. Neulasta on-pro applied to pt's left arm with green indicator light flashing. VSS Pt discharged self ambulatory in satisfactory condition

## 2018-02-01 NOTE — Patient Instructions (Signed)
Todd [name] at [facility] Discharge Instructions  5FU pump discontinued and port flushed per protocol. Neulasta on-pro applied as well. Follow-up as scheduled. Call clinic for any questions or concerns   Thank you for choosing [facility] at [facility] to provide your oncology and hematology care.  To afford each patient quality time with our provider, please arrive at least 15 minutes before your scheduled appointment time.   If you have a lab appointment with the [facility] please come in thru the  Main Entrance and check in at the main information desk  You need to re-schedule your appointment should you arrive 10 or more minutes late.  We strive to give you quality time with our providers, and arriving late affects you and other patients whose appointments are after yours.  Also, if you no show three or more times for appointments you may be dismissed from the clinic at the providers discretion.     Again, thank you for choosing [facility].  Our hope is that these requests will decrease the amount of time that you wait before being seen by our physicians.       _____________________________________________________________  Should you have questions after your visit to [facility], please contact our office at [phone] between the hours of 8:30 a.m. and 4:30 p.m.  Voicemails left after 4:30 p.m. will not be returned until the following business day.  For prescription refill requests, have your pharmacy contact our office.       Resources For Cancer Patients and their Caregivers ? American Cancer Society: Can assist with transportation, wigs, general needs, runs Look Good Feel Better.        (513)415-5826 ? Cancer Care: Provides financial assistance, online support groups, medication/co-pay assistance.  1-800-813-HOPE (817) 204-3526) ? [facility] Assists [place] cancer patients and  their families through emotional , educational and financial support.  832-089-4866 ? [place] DSS Where to apply for food stamps, Medicaid and utility assistance. 909 761 8783 ? [facility]: Transportation to medical appointments. 8430685215 ? Social Security Administration: May apply for disability if have a Stage IV cancer. 360-216-4298 (402) 186-8635 ? [employer], Disability and Transit Services: Assists with nutrition, care and transit needs. [facility] Support Programs:   > Cancer Support Group  2nd Tuesday of the month 1pm-2pm, Journey Room   > Creative Journey  3rd Tuesday of the month 1130am-1pm, Journey Room

## 2018-02-02 ENCOUNTER — Encounter (HOSPITAL_COMMUNITY): Payer: Self-pay

## 2018-02-07 ENCOUNTER — Inpatient Hospital Stay (HOSPITAL_BASED_OUTPATIENT_CLINIC_OR_DEPARTMENT_OTHER): Payer: Medicare Other | Admitting: Internal Medicine

## 2018-02-07 ENCOUNTER — Other Ambulatory Visit: Payer: Self-pay

## 2018-02-07 ENCOUNTER — Inpatient Hospital Stay (HOSPITAL_COMMUNITY): Payer: Medicare Other

## 2018-02-07 ENCOUNTER — Encounter (HOSPITAL_COMMUNITY): Payer: Self-pay | Admitting: Internal Medicine

## 2018-02-07 VITALS — BP 110/76 | HR 78 | Temp 98.4°F | Resp 16 | Wt 191.0 lb

## 2018-02-07 DIAGNOSIS — C169 Malignant neoplasm of stomach, unspecified: Secondary | ICD-10-CM | POA: Diagnosis not present

## 2018-02-07 DIAGNOSIS — Z5111 Encounter for antineoplastic chemotherapy: Secondary | ICD-10-CM | POA: Diagnosis not present

## 2018-02-07 DIAGNOSIS — F17210 Nicotine dependence, cigarettes, uncomplicated: Secondary | ICD-10-CM

## 2018-02-07 DIAGNOSIS — Z800 Family history of malignant neoplasm of digestive organs: Secondary | ICD-10-CM | POA: Diagnosis not present

## 2018-02-07 DIAGNOSIS — I700 Atherosclerosis of aorta: Secondary | ICD-10-CM

## 2018-02-07 DIAGNOSIS — Z809 Family history of malignant neoplasm, unspecified: Secondary | ICD-10-CM

## 2018-02-07 DIAGNOSIS — I714 Abdominal aortic aneurysm, without rupture: Secondary | ICD-10-CM

## 2018-02-07 DIAGNOSIS — Z79899 Other long term (current) drug therapy: Secondary | ICD-10-CM | POA: Diagnosis not present

## 2018-02-07 DIAGNOSIS — I10 Essential (primary) hypertension: Secondary | ICD-10-CM | POA: Diagnosis not present

## 2018-02-07 DIAGNOSIS — Z452 Encounter for adjustment and management of vascular access device: Secondary | ICD-10-CM | POA: Diagnosis not present

## 2018-02-07 DIAGNOSIS — K521 Toxic gastroenteritis and colitis: Secondary | ICD-10-CM | POA: Diagnosis not present

## 2018-02-07 DIAGNOSIS — Z7982 Long term (current) use of aspirin: Secondary | ICD-10-CM

## 2018-02-07 DIAGNOSIS — Z5189 Encounter for other specified aftercare: Secondary | ICD-10-CM | POA: Diagnosis not present

## 2018-02-07 LAB — COMPREHENSIVE METABOLIC PANEL
ALT: 15 U/L — ABNORMAL LOW (ref 17–63)
ANION GAP: 9 (ref 5–15)
AST: 17 U/L (ref 15–41)
Albumin: 3.9 g/dL (ref 3.5–5.0)
Alkaline Phosphatase: 73 U/L (ref 38–126)
BUN: 11 mg/dL (ref 6–20)
CHLORIDE: 102 mmol/L (ref 101–111)
CO2: 22 mmol/L (ref 22–32)
CREATININE: 1.19 mg/dL (ref 0.61–1.24)
Calcium: 9.4 mg/dL (ref 8.9–10.3)
Glucose, Bld: 112 mg/dL — ABNORMAL HIGH (ref 65–99)
POTASSIUM: 3.5 mmol/L (ref 3.5–5.1)
SODIUM: 133 mmol/L — AB (ref 135–145)
Total Bilirubin: 0.5 mg/dL (ref 0.3–1.2)
Total Protein: 7.6 g/dL (ref 6.5–8.1)

## 2018-02-07 LAB — CBC WITH DIFFERENTIAL/PLATELET
BASOS PCT: 0 %
Basophils Absolute: 0 10*3/uL (ref 0.0–0.1)
EOS PCT: 0 %
Eosinophils Absolute: 0 10*3/uL (ref 0.0–0.7)
HCT: 36.2 % — ABNORMAL LOW (ref 39.0–52.0)
Hemoglobin: 12.3 g/dL — ABNORMAL LOW (ref 13.0–17.0)
LYMPHS PCT: 18 %
Lymphs Abs: 1.6 10*3/uL (ref 0.7–4.0)
MCH: 29.3 pg (ref 26.0–34.0)
MCHC: 34 g/dL (ref 30.0–36.0)
MCV: 86.2 fL (ref 78.0–100.0)
Monocytes Absolute: 1.8 10*3/uL — ABNORMAL HIGH (ref 0.1–1.0)
Monocytes Relative: 20 %
NEUTROS PCT: 62 %
Neutro Abs: 5.6 10*3/uL (ref 1.7–7.7)
PLATELETS: 170 10*3/uL (ref 150–400)
RBC: 4.2 MIL/uL — AB (ref 4.22–5.81)
RDW: 14.1 % (ref 11.5–15.5)
WBC: 9 10*3/uL (ref 4.0–10.5)

## 2018-02-07 NOTE — Progress Notes (Signed)
Diagnosis Gastric carcinoma ([place]) - Plan: CBC with Differential/Platelet, Comprehensive metabolic panel, Lactate dehydrogenase  Staging Cancer Staging No matching staging information was found for the patient.  Assessment and Plan:  1.  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  57 y.o. male initially seen by Dr. Sherrine Maples and reported he was in his usual state of health until around Christmas 2018 when he went camping in [place].  During that trip patient apparently had acute onset of epigastric pain associated with vomiting.  He reports retching with vomiting.  Sometime during that episode patient apparently attempted to put his fingers in the back of his throat to relieve the sensation of food being lodged thinking that he may have had a fish bone that was stuck in his throat.  This culminated in vomiting with small amount of blood.  He subsequently had relief from vomiting but he continued to have difficulty swallowing and hoarseness of voice.  Imaging of the neck showed posterior pharyngeal edema without evidence of any foreign object or fishbone.  The pain subsequently improved gradually.    He underwent a colonoscopy that showed tubular adenomas.  An EGD that revealed diffusely infiltrated wall of the stomach with superficial ulcerations.  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    PET scan was done 10/24/2017 and showed IMPRESSION: 1. Small focus of hypermetabolism along the lesser curvature of the antrum of the stomach, corresponding to recently diagnosed gastric neoplasm. No associated lymphadenopathy or definite findings to suggest metastatic disease to the neck, chest, abdomen or pelvis. 2. Aortic atherosclerosis, in addition to left main and 3 vessel coronary artery disease. Please note that although the presence of coronary artery calcium documents the presence of coronary artery disease, the  severity of this disease and any potential stenosis cannot be assessed on this non-gated CT examination. Assessment for potential risk factor modification, dietary therapy or pharmacologic therapy may be warranted, if clinically indicated. 3. In addition, there is fusiform aneurysmal dilatation of the infrarenal abdominal aorta which measures up to 3.9 x 3.7 cm. Recommend followup by ultrasound in 2 years. This recommendation follows ACR consensus guidelines: White Paper of the ACR Incidental Findings Committee II on Vascular Findings. J Am Coll Radiol 2013; 10:789-794.  He Is followed by Dr. Barry Dienes of surgery.  He initially indicated he did not desire surgery or chemotherapy.  Patient remains asymptomatic other than mild diarrhea.    Pt has diffuse gastric adenocarcinoma that appears to be localized to the stomach.  Histologically he appears to have a signet ring variety with diffuse involvement.  He is being treated with neoadjuvant chemotherapy followed by surgery followed by adjuvant chemotherapy.  Previously, I have discussed with him that even if he were to be treated with surgery alone based on the diffuse nature of his cancer he was still be recommended for adjuvant therapy.    He is here for follow-up after C1 of FLOT regimen (5-FU dose at 2600 mg/m with 24 hours, oxaliplatin 85 mg/m, leucovorin 200 mg/m, and Taxotere 50 mg/m all on day 1 every 2 weeks for 4 cycles before surgery and 4 cycles following surgery.  He is tolerating therapy.  Counts today show WBC 9, HB 12.3 Plts 170, Chemistries WNL.    He will RTC in 1 week for C2 of FLOT.  He will be seen for follow-up June 4 for C3 of therapy.  He will follow-up with Dr. Barry Dienes once initial  4 cycles are completed.    PET scan done 10/24/2017 showed no evidence of distant disease.  He will be set up for repeat imaging after C4 completed.  EUS that was done 11/24/2017 showed:  Normal esophagus, ulcerated mass involving the lesser  curvature of the stomach approximately mid body.  Mass measured 5 cm across 5 cm in length.  Mass appeared to be 7- 8 cm from the pylorus.  It was also 5 to 6 cm from the GE junction.  It appeared to pass into and through the muscularis propria as well as the serosa making it a T4 a lesion.  Mass was 1.6 cm in maximal thickness.  No adenopathy was noted.  He was staged as a T4 a N0 by EUS.    He  Has undergone genetic testing that was negative.     2.  Diarrhea.  He reports this is mild.  He should continue imodium as directed.    3.  Hypertension.  BP 110/76.  Follow-up with PCP.  4.  Smoking.  Cessation is recommended.  PET scan showed no clear lung abnormalities.  5.  Abdominal aortic aneurysm.  This was noted on recent PET scan.  Patient was also noted to have atherosclerosis on recent PET scan.  He has been seen by vascular surgery and cardiology in the past and was on aspirin and statins.   Current Status: He is seen today for follow-up after C1 of Flot.  Marland Kitchen  He reports minor diarrhea.      Malignant neoplasm of stomach ([place])   11/11/2017 Initial Diagnosis    Malignant neoplasm of stomach ([place])      12/16/2017 Genetic Testing    Negative genetic testing on the common hereditary cancer panel.  The Hereditary Gene Panel offered by [facility] includes sequencing and/or deletion duplication testing of the following 47 genes: APC, ATM, AXIN2, BARD1, BMPR1A, BRCA1, BRCA2, BRIP1, CDH1, CDK4, CDKN2A (p14ARF), CDKN2A (p16INK4a), CHEK2, CTNNA1, DICER1, EPCAM (Deletion/duplication testing only), GREM1 (promoter region deletion/duplication testing only), KIT, MEN1, MLH1, MSH2, MSH3, MSH6, MUTYH, NBN, NF1, NHTL1, PALB2, PDGFRA, PMS2, POLD1, POLE, PTEN, RAD50, RAD51C, RAD51D, SDHB, SDHC, SDHD, SMAD4, SMARCA4. STK11, TP53, TSC1, TSC2, and VHL.  The following genes were evaluated for sequence changes only: SDHA and HOXB13 c.251G>A variant only. The report date is December 16, 2017.        Gastric carcinoma  ([place])   01/23/2018 Initial Diagnosis    Gastric carcinoma ([place])      01/26/2018 -  Chemotherapy    The patient had palonosetron (ALOXI) injection 0.25 mg, 0.25 mg, Intravenous,  Once, 1 of 4 cycles Administration: 0.25 mg (01/31/2018) pegfilgrastim (NEULASTA ONPRO KIT) injection 6 mg, 6 mg, Subcutaneous, Once, 1 of 4 cycles Administration: 6 mg (02/01/2018) leucovorin 400 mg in dextrose 5 % 250 mL infusion, 428 mg, Intravenous,  Once, 1 of 4 cycles Administration: 400 mg (01/31/2018) oxaliplatin (ELOXATIN) 180 mg in dextrose 5 % 500 mL chemo infusion, 85 mg/m2 = 180 mg, Intravenous,  Once, 1 of 4 cycles Administration: 180 mg (01/31/2018) DOCEtaxel (TAXOTERE) 110 mg in sodium chloride 0.9 % 250 mL chemo infusion, 50 mg/m2 = 110 mg, Intravenous,  Once, 1 of 4 cycles Administration: 110 mg (01/31/2018) fluorouracil (ADRUCIL) 5,550 mg in sodium chloride 0.9 % 139 mL chemo infusion, 2,600 mg/m2 = 5,550 mg, Intravenous, 1 Day/Dose, 1 of 4 cycles Administration: 5,550 mg (01/31/2018)  for chemotherapy treatment.         Problem List Patient Active Problem  List   Diagnosis Date Noted  . Gastric carcinoma ([place]) [C16.9] 01/23/2018  . Genetic testing [Z13.79] 12/20/2017  . Family history of cancer [Z80.9]   . IBS (irritable bowel syndrome) [K58.9] 11/28/2017  . Malignant neoplasm of stomach ([place]) [C16.9] 11/11/2017  . Constipation [K59.00] 08/30/2017  . GERD (gastroesophageal reflux disease) [K21.9] 08/30/2017  . Globus sensation [F45.8] 08/30/2017  . PAD (peripheral artery disease) ([place]) [I73.9] 08/09/2014  . CAD (coronary artery disease) [I25.10] 07/22/2014  . Tobacco abuse [Z72.0] 07/22/2014  . 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 Hypertension [I10] 07/19/2012  . Hyperlipidemia [E78.5] 07/19/2012  . PVD (peripheral vascular disease) with claudication ([place]) [I73.9] 06/29/2012  . Atherosclerosis of native arteries of the extremities with intermittent claudication [I70.219]  04/20/2012    Past Medical History Past Medical History:  Diagnosis Date  . CAD (coronary artery disease)    a. Cath 07/2012 following abnormal nuclear stress test: 20-30% LAD, 20-30% OM1, occluded branch off OM2 that fills slowly via collaterals, small nondominant RCA occluded proximally, fills slowly via collaterals.  . CKD (chronic kidney disease), stage II   . Family history of cancer   . Gastric adenocarcinoma ([place])   . History of blood transfusion    "when I was a child"  . Hyperlipidemia   . Hypertension   . Peripheral vascular disease ([place])    a. bilateral superficial femoral artery stents in May/August of 2012. b. Left femoral to above-knee popliteal bypass in 08/2012.  Marland Kitchen Renal insufficiency   . Stroke [facility])    2008 right side weakness  . Tobacco abuse     Past Surgical History Past Surgical History:  Procedure Laterality Date  . ABDOMINAL AORTAGRAM N/A 04/27/2012   Procedure: ABDOMINAL Maxcine Ham;  Surgeon: Conrad Christmas, MD;  Location: [facility] CATH LAB;  Service: Cardiovascular;  Laterality: N/A;  . ABDOMINAL AORTAGRAM N/A 08/09/2014   Procedure: ABDOMINAL Maxcine Ham;  Surgeon: Elam Dutch, MD;  [facility] CATH LAB;  Service: Cardiovascular;  Laterality: N/A;  . BIOPSY  10/06/2017   Procedure: BIOPSY;  Surgeon: Daneil Dolin, MD;  [facility] ENDO SU[facility];  Service: Endoscopy;;  gastric   . CARDIAC CATHETERIZATION    . COLONOSCOPY    . COLONOSCOPY WITH PROPOFOL N/A 10/06/2017   Procedure: COLONOSCOPY WITH PROPOFOL;  Surgeon: Daneil Dolin, MD;  Location: [facility] ENDO SU[facility];  Service: Endoscopy;  Laterality: N/A;  1:45pm - patient can't come earlier due to transportation  . ESOPHAGOGASTRODUODENOSCOPY (EGD) WITH PROPOFOL N/A 10/06/2017   Procedure: ESOPHAGOGASTRODUODENOSCOPY (EGD) WITH PROPOFOL;  Surgeon: Daneil Dolin, MD;  [facility]: [facility];  Service: Endoscopy;  Laterality: N/A;  . EUS N/A 11/24/2017   Procedure: UPPER ENDOSCOPIC ULTRASOUND (EUS) RADIAL;  Surgeon:  Milus Banister, MD;  Location: [facility] ENDOSCOPY;  Service: Endoscopy;  Laterality: N/A;  . FEMORAL ARTERY - POPLITEAL ARTERY BYPASS GRAFT    . FEMORAL-POPLITEAL BYPASS GRAFT  09/26/2012   Procedure: BYPASS GRAFT FEMORAL-POPLITEAL ARTERY;  Surgeon: Elam Dutch, MD;  Location: [facility] OR;  Service: Vascular;  Laterality: Left;  using 45m Gore-Tex Propaten Ringed Graft, Vein patch on the proximal and distal anastomosis  . FEMORAL-POPLITEAL BYPASS GRAFT Right 08/12/2014   Procedure:  FEMORAL-POPLITEAL ARTERY BYPASS WITH SAPHENOUS VEIN GRAFT, INTRAOPERATIVE ARTERIOGRAM;  Surgeon: CElam Dutch MD;  [place] [facility]  Service: Vascular;  Laterality: Right;  . INTRAOPERATIVE ARTERIOGRAM  08/12/2014   Procedure: INTRA OPERATIVE ARTERIOGRAM;  Surgeon: CElam Dutch MD;  Location: [facility]  Service:  Vascular;;  . POLYPECTOMY  10/06/2017   Procedure: POLYPECTOMY;  Surgeon: Daneil Dolin, MD;  [facility];  Service: Endoscopy;;  splenic flexure,  ascending  . PORTACATH PLACEMENT N/A 01/24/2018   Procedure: INSERTION PORT-A-CATH;  Surgeon: [name], MD;  Location: [place];  Service: General;  Laterality: N/A;  . PR VEIN BYPASS GRAFT,AORTO-FEM-POP    . stents in bil legs      Family History Family History  Problem Relation Age of Onset  . Diabetes Mother   . Heart disease Mother        before age 15- Triple BPG  . Cancer Mother        Luekemia  . Deep vein thrombosis Mother   . Hyperlipidemia Mother   . Hypertension Mother   . Heart attack Mother   . Peripheral vascular disease Mother   . Stroke Mother        x 3  . Pancreatic cancer Mother   . Arthritis Father   . Hypertension Father   . Kidney disease Father   . Cancer Maternal Grandmother        NOS  . Diabetes Cousin   . Cancer Cousin        NOS, pat first cousin  . Cancer Cousin        NOS, pat first cousin  . Colon cancer Neg Hx   . Gastric cancer Neg Hx   . Esophageal cancer Neg Hx      Social History   reports that he has been smoking cigars.  He started smoking about 43 years ago. He has a 25.00 pack-year smoking history. He has never used smokeless tobacco. He reports that he has current or past drug history. Drug: Marijuana. He reports that he does not drink alcohol.  Medications  Current Outpatient Medications:  .  amLODipine (NORVASC) 10 MG tablet, TAKE ONE TABLET BY MOUTH ONCE DAILY, Disp: 90 tablet, Rfl: 3 .  aspirin EC 81 MG tablet, Take 81 mg by mouth daily., Disp: , Rfl:  .  lisinopril (PRINIVIL,ZESTRIL) 40 MG tablet, Take 40 mg by mouth at bedtime., Disp: , Rfl:  .  lubiprostone (AMITIZA) 8 MCG capsule, Take 1 capsule (8 mcg total) by mouth 2 (two) times daily with a meal., Disp: 60 capsule, Rfl: 11 .  ondansetron (ZOFRAN) 8 MG tablet, Take 1 tablet (8 mg total) by mouth 2 (two) times daily as needed for refractory nausea / vomiting. Start on day 3 after chemotherapy., Disp: 30 tablet, Rfl: 1 .  oxyCODONE (OXY IR/ROXICODONE) 5 MG immediate release tablet, Take 1 tablet (5 mg total) by mouth every 6 (six) hours as needed for severe pain., Disp: 20 tablet, Rfl: 0 .  pantoprazole (PROTONIX) 40 MG tablet, TAKE 1 TABLET BY MOUTH TWICE DAILY BEFORE A MEAL, Disp: 60 tablet, Rfl: 3 .  prochlorperazine (COMPAZINE) 10 MG tablet, Take 1 tablet (10 mg total) by mouth every 6 (six) hours as needed (Nausea or vomiting)., Disp: 30 tablet, Rfl: 1 .  rosuvastatin (CRESTOR) 10 MG tablet, TAKE ONE TABLET BY MOUTH ONCE DAILY (Patient taking differently: TAKE 10 MG BY MOUTH ONCE DAILY AT BEDTIME), Disp: 90 tablet, Rfl: 3 .  valACYclovir (VALTREX) 500 MG tablet, Take 500 mg by mouth at bedtime., Disp: , Rfl:  .  vitamin B-12 (CYANOCOBALAMIN) 1000 MCG tablet, Take 1,000 mcg by mouth daily., Disp: , Rfl:  .  dexamethasone (DECADRON) 4 MG tablet, Take 2 tablets (8 mg total) by mouth daily. Start  the day after chemotherapy for 2 days. Take with food. (Patient not taking: Reported on 02/07/2018), Disp: 30 tablet,  Rfl: 1 .  DOCEtaxel (TAXOTERE IV), Inject into the vein., Disp: , Rfl:  .  fluorouracil CALGB 70350 in sodium chloride 0.9 % 150 mL, Inject into the vein over 24 hr., Disp: , Rfl:  .  LEUCOVORIN CALCIUM IV, Inject into the vein., Disp: , Rfl:  .  lidocaine-prilocaine (EMLA) cream, Apply to affected area once (Patient not taking: Reported on 02/07/2018), Disp: 30 g, Rfl: 3 .  OXALIPLATIN IV, Inject into the vein., Disp: , Rfl:   Allergies Potassium-containing compounds  Review of Systems Review of Systems - Oncology ROS as per HPI otherwise 12 point ROS is negative.   Physical Exam  Vitals Wt Readings from Last 3 Encounters:  02/07/18 191 lb (86.6 kg)  01/31/18 197 lb 14.4 oz (89.8 kg)  01/24/18 191 lb (86.6 kg)   Temp Readings from Last 3 Encounters:  02/07/18 98.4 F (36.9 C) (Oral)  02/01/18 98.4 F (36.9 C) (Oral)  01/31/18 98.6 F (37 C)   BP Readings from Last 3 Encounters:  02/07/18 110/76  02/01/18 130/80  01/31/18 135/81   Pulse Readings from Last 3 Encounters:  02/07/18 78  02/01/18 84  01/31/18 75   Constitutional: Well-developed, well-nourished, and in no distress.   HENT: Head: Normocephalic and atraumatic.  Mouth/Throat: No oropharyngeal exudate. Mucosa moist. Eyes: Pupils are equal, round, and reactive to light. Conjunctivae are normal. No scleral icterus.  Neck: Normal range of motion. Neck supple. No JVD present.  Cardiovascular: Normal rate, regular rhythm and normal heart sounds.  Exam reveals no gallop and no friction rub.   No murmur heard. Pulmonary/Chest: Effort normal and breath sounds normal. No respiratory distress. No wheezes.No rales.  Abdominal: Soft. Bowel sounds are normal. No distension. There is no tenderness. There is no guarding.  Musculoskeletal: No edema or tenderness.  Lymphadenopathy: No cervical, axillary or supraclavicular adenopathy.  Neurological: Alert and oriented to person, place, and time. No cranial nerve deficit.   Skin: Skin is warm and dry. No rash noted. No erythema. No pallor.  Psychiatric: Affect and judgment normal.   Labs Appointment on 02/07/2018  Component Date Value Ref Range Status  . WBC 02/07/2018 9.0  4.0 - 10.5 K/uL Final  . RBC 02/07/2018 4.20* 4.22 - 5.81 MIL/uL Final  . Hemoglobin 02/07/2018 12.3* 13.0 - 17.0 g/dL Final  . HCT 02/07/2018 36.2* 39.0 - 52.0 % Final  . MCV 02/07/2018 86.2  78.0 - 100.0 fL Final  . MCH 02/07/2018 29.3  26.0 - 34.0 pg Final  . MCHC 02/07/2018 34.0  30.0 - 36.0 g/dL Final  . RDW 02/07/2018 14.1  11.5 - 15.5 % Final  . Platelets 02/07/2018 170  150 - 400 K/uL Final  . Neutrophils Relative % 02/07/2018 62  % Final  . Lymphocytes Relative 02/07/2018 18  % Final  . Monocytes Relative 02/07/2018 20  % Final  . Eosinophils Relative 02/07/2018 0  % Final  . Basophils Relative 02/07/2018 0  % Final  . Neutro Abs 02/07/2018 5.6  1.7 - 7.7 K/uL Final  . Lymphs Abs 02/07/2018 1.6  0.7 - 4.0 K/uL Final  . Monocytes Absolute 02/07/2018 1.8* 0.1 - 1.0 K/uL Final  . Eosinophils Absolute 02/07/2018 0.0  0.0 - 0.7 K/uL Final  . Basophils Absolute 02/07/2018 0.0  0.0 - 0.1 K/uL Final  . WBC Morphology 02/07/2018 DOHLE BODIES   Final   Comment:  MILD LEFT SHIFT (1-5% METAS, OCC MYELO, OCC BANDS) Performed at [facility], [address] [place], [place] [address]   . Sodium 02/07/2018 133* 135 - 145 mmol/L Final  . Potassium 02/07/2018 3.5  3.5 - 5.1 mmol/L Final  . Chloride 02/07/2018 102  101 - 111 mmol/L Final  . CO2 02/07/2018 22  22 - 32 mmol/L Final  . Glucose, Bld 02/07/2018 112* 65 - 99 mg/dL Final  . BUN 02/07/2018 11  6 - 20 mg/dL Final  . Creatinine, Ser 02/07/2018 1.19  0.61 - 1.24 mg/dL Final  . Calcium 02/07/2018 9.4  8.9 - 10.3 mg/dL Final  . Total Protein 02/07/2018 7.6  6.5 - 8.1 g/dL Final  . Albumin 02/07/2018 3.9  3.5 - 5.0 g/dL Final  . AST 02/07/2018 17  15 - 41 U/L Final  . ALT 02/07/2018 15* 17 - 63 U/L Final  . Alkaline Phosphatase  02/07/2018 73  38 - 126 U/L Final  . Total Bilirubin 02/07/2018 0.5  0.3 - 1.2 mg/dL Final  . GFR calc non Af Amer 02/07/2018 >60  >60 mL/min Final  . GFR calc Af Amer 02/07/2018 >60  >60 mL/min Final   Comment: (NOTE) The eGFR has been calculated using the CKD EPI equation. This calculation has not been validated in all clinical situations. eGFR's persistently <60 mL/min signify possible Chronic Kidney Disease.   Georgiann Hahn gap 02/07/2018 9  5 - 15 Final   Performed at [facility], [address], [place], [place] [address]     Pathology Orders Placed This Encounter  Procedures  . CBC with Differential/Platelet    Standing Status:   Future    Standing Expiration Date:   02/08/2019  . Comprehensive metabolic panel    Standing Status:   Future    Standing Expiration Date:   02/08/2019  . Lactate dehydrogenase    Standing Status:   Future    Standing Expiration Date:   02/08/2019       Zoila Shutter MD

## 2018-02-08 ENCOUNTER — Encounter (HOSPITAL_COMMUNITY): Payer: Self-pay | Admitting: Emergency Medicine

## 2018-02-08 ENCOUNTER — Emergency Department (HOSPITAL_COMMUNITY)
Admission: EM | Admit: 2018-02-08 | Discharge: 2018-02-08 | Disposition: A | Discharge status: Home / Self Care | Payer: Medicare Other | Attending: Emergency Medicine | Admitting: Emergency Medicine

## 2018-02-08 ENCOUNTER — Other Ambulatory Visit: Payer: Self-pay

## 2018-02-08 DIAGNOSIS — T7840XA Allergy, unspecified, initial encounter: Secondary | ICD-10-CM | POA: Insufficient documentation

## 2018-02-08 DIAGNOSIS — Z5321 Procedure and treatment not carried out due to patient leaving prior to being seen by health care provider: Secondary | ICD-10-CM | POA: Diagnosis not present

## 2018-02-08 NOTE — ED Triage Notes (Signed)
Pt states he took his lisinopril this morning and noticed swelling to lips. Minimal swelling noted bottom lip. No distress noted. Pt denies throat or tongue swelling, no difficulty breathing or swallowing. No benadryl at home.

## 2018-02-08 NOTE — ED Notes (Signed)
Pt no longer in waiting area.  

## 2018-02-13 NOTE — Progress Notes (Signed)
Diagnosis Gastric adenocarcinoma ([place]) - Plan: CBC with Differential/Platelet, Comprehensive metabolic panel, Lactate dehydrogenase, Ferritin, CEA  Staging Cancer Staging No matching staging information was found for the patient.  Assessment and plan:  1.  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  Histologically he has signet ring variety with diffuse involvement that are below the threshold of the PET scan.  PET scan done 10/24/2017 showed a small focus of hypermetabolism along the lesser curvature of the antrum of the stomach corresponding to recently diagnosed gastric neoplasm no associated lymphadenopathy or signs of metastatic disease in the neck, chest, abdomen or pelvis.  He does have a fusiform aneurysmal dilatation of the infrarenal abdominal aorta measuring 3.9 x 3.7 cm recommend follow-up has been recommended by ultrasound in 2 years.  Patient also was noted incidentally to have aortic atherosclerosis.  Dr. Wyatt Portela discussed with Dr. Servando Snare who recommended that he be referred to general surgery for gastrectomy.  He was recommended for EUS that was done on 11/24/2017 showed:  Normal esophagus, ulcerated mass involving the lesser curvature of the stomach approximately mid body.  Mass measured 5 cm across 5 cm in length.  Mass appeared to be 7- 8 cm from the pylorus.  It was also 5 to 6 cm from the GE junction.  It appeared to pass into and through the muscularis propria as well as the serosa making it a T4 a lesion.  Mass was 1.6 cm in maximal thickness.  No adenopathy was noted.  He was staged as a T4 a N0 by EUS.    He has been referred for genetic testing.    He has been seen by Dr. Barry Dienes who has reviewed case and pt was also recommended for neoadjuvant therapy for followed by surgery.  The pt was reporting he will not agree to any chemotherapy or radiotherapy even if it were recommended.  I have reviewed with him standard recommendation for  neoadjuvant therapy followed by surgery followed by adjuvant therapy.  He desires to contemplate the information presented.  Will refer pt back to Dr. Barry Dienes for review and to discuss PAC placement.  He will RTC in 10 -14 days for follow-up to determine if he desires to proceed with therapy.   I have also discussed with him the likely guaranteed risk of progression if he does not make treatment decision and continues to avoid therapy.    2.  HTN.  BP is 124/80.  Follow-up with PCP.    Current Status:  Pt is seen today for follow-up.  He is here to go over EUS and PET scan and for further discussion of treatment options.  He reports he does not desire chemotherapy or surgery.      Malignant neoplasm of stomach ([place])   11/11/2017 Initial Diagnosis    Malignant neoplasm of stomach ([place])      12/16/2017 Genetic Testing    Negative genetic testing on the common hereditary cancer panel.  The Hereditary Gene Panel offered by [facility] includes sequencing and/or deletion duplication testing of the following 47 genes: APC, ATM, AXIN2, BARD1, BMPR1A, BRCA1, BRCA2, BRIP1, CDH1, CDK4, CDKN2A (p14ARF), CDKN2A (p16INK4a), CHEK2, CTNNA1, DICER1, EPCAM (Deletion/duplication testing only), GREM1 (promoter region deletion/duplication testing only), KIT, MEN1, MLH1, MSH2, MSH3, MSH6, MUTYH, NBN, NF1, NHTL1, PALB2, PDGFRA, PMS2, POLD1, POLE, PTEN, RAD50, RAD51C, RAD51D, SDHB, SDHC, SDHD, SMAD4, SMARCA4. STK11, TP53, TSC1, TSC2, and VHL.  The following genes were evaluated for sequence changes only: SDHA and HOXB13 c.251G>A variant  only. The report date is December 16, 2017.        Gastric carcinoma ([place])   01/23/2018 Initial Diagnosis    Gastric carcinoma ([place])      01/26/2018 -  Chemotherapy    The patient had palonosetron (ALOXI) injection 0.25 mg, 0.25 mg, Intravenous,  Once, 1 of 4 cycles Administration: 0.25 mg (01/31/2018) pegfilgrastim (NEULASTA ONPRO KIT) injection 6 mg, 6 mg, Subcutaneous, Once, 1 of 4  cycles Administration: 6 mg (02/01/2018) leucovorin 400 mg in dextrose 5 % 250 mL infusion, 428 mg, Intravenous,  Once, 1 of 4 cycles Administration: 400 mg (01/31/2018) oxaliplatin (ELOXATIN) 180 mg in dextrose 5 % 500 mL chemo infusion, 85 mg/m2 = 180 mg, Intravenous,  Once, 1 of 4 cycles Administration: 180 mg (01/31/2018) DOCEtaxel (TAXOTERE) 110 mg in sodium chloride 0.9 % 250 mL chemo infusion, 50 mg/m2 = 110 mg, Intravenous,  Once, 1 of 4 cycles Administration: 110 mg (01/31/2018) fluorouracil (ADRUCIL) 5,550 mg in sodium chloride 0.9 % 139 mL chemo infusion, 2,600 mg/m2 = 5,550 mg, Intravenous, 1 Day/Dose, 1 of 4 cycles Administration: 5,550 mg (01/31/2018)  for chemotherapy treatment.         Problem List Patient Active Problem List   Diagnosis Date Noted  . Gastric carcinoma ([place]) [C16.9] 01/23/2018  . Genetic testing [Z13.79] 12/20/2017  . Family history of cancer [Z80.9]   . IBS (irritable bowel syndrome) [K58.9] 11/28/2017  . Malignant neoplasm of stomach ([place]) [C16.9] 11/11/2017  . Constipation [K59.00] 08/30/2017  . GERD (gastroesophageal reflux disease) [K21.9] 08/30/2017  . Globus sensation [F45.8] 08/30/2017  . PAD (peripheral artery disease) ([place]) [I73.9] 08/09/2014  . CAD (coronary artery disease) [I25.10] 07/22/2014  . Tobacco abuse [Z72.0] 07/22/2014  . 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 Hypertension [I10] 07/19/2012  . Hyperlipidemia [E78.5] 07/19/2012  . PVD (peripheral vascular disease) with claudication ([place]) [I73.9] 06/29/2012  . Atherosclerosis of native arteries of the extremities with intermittent claudication [I70.219] 04/20/2012    Past Medical History Past Medical History:  Diagnosis Date  . CAD (coronary artery disease)    a. Cath 07/2012 following abnormal nuclear stress test: 20-30% LAD, 20-30% OM1, occluded branch off OM2 that fills slowly via collaterals, small nondominant RCA occluded proximally, fills slowly via  collaterals.  . CKD (chronic kidney disease), stage II   . Family history of cancer   . Gastric adenocarcinoma ([place])   . History of blood transfusion    "when I was a child"  . Hyperlipidemia   . Hypertension   . Peripheral vascular disease ([place])    a. bilateral superficial femoral artery stents in May/August of 2012. b. Left femoral to above-knee popliteal bypass in 08/2012.  Marland Kitchen Renal insufficiency   . Stroke [facility])    2008 right side weakness  . Tobacco abuse     Past Surgical History Past Surgical History:  Procedure Laterality Date  . ABDOMINAL AORTAGRAM N/A 04/27/2012   Procedure: ABDOMINAL Maxcine Ham;  Surgeon: Conrad Mililani [place], MD;  Location[facility] CATH LAB;  Service: Cardiovascular;  Laterality: N/A;  . ABDOMINAL AORTAGRAM N/A 08/09/2014   Procedure: ABDOMINAL Maxcine Ham;  Surgeon: Elam Dutch, MD;  Location: [facility] CATH LAB;  Service: Cardiovascular;  Laterality: N/A;  . BIOPSY  10/06/2017   Procedure: BIOPSY;  Surgeon: Daneil Dolin, MD;  Location: [facility] ENDO SUITE;  Service: Endoscopy;;  gastric   . CARDIAC CATHETERIZATION    . COLONOSCOPY    . COLONOSCOPY WITH PROPOFOL N/A 10/06/2017   Procedure:  COLONOSCOPY WITH PROPOFOL;  Surgeon: Daneil Dolin, MD;  Location: [facility] ENDO SUITE;  Service: Endoscopy;  Laterality: N/A;  1:45pm - patient can't come earlier due to transportation  . ESOPHAGOGASTRODUODENOSCOPY (EGD) WITH PROPOFOL N/A 10/06/2017   Procedure: ESOPHAGOGASTRODUODENOSCOPY (EGD) WITH PROPOFOL;  Surgeon: Daneil Dolin, MD;  Location: [facility] ENDO SUITE;  Service: Endoscopy;  Laterality: N/A;  . EUS N/A 11/24/2017   Procedure: UPPER ENDOSCOPIC ULTRASOUND (EUS) RADIAL;  Surgeon: Milus Banister, MD;  Location: [facility] ENDOSCOPY;  Service: Endoscopy;  Laterality: N/A;  . FEMORAL ARTERY - POPLITEAL ARTERY BYPASS GRAFT    . FEMORAL-POPLITEAL BYPASS GRAFT  09/26/2012   Procedure: BYPASS GRAFT FEMORAL-POPLITEAL ARTERY;  Surgeon: Elam Dutch, MD;  Location: [facility] OR;  Service: Vascular;   Laterality: Left;  using 16m Gore-Tex Propaten Ringed Graft, Vein patch on the proximal and distal anastomosis  . FEMORAL-POPLITEAL BYPASS GRAFT Right 08/12/2014   Procedure:  FEMORAL-POPLITEAL ARTERY BYPASS WITH SAPHENOUS VEIN GRAFT, INTRAOPERATIVE ARTERIOGRAM;  Surgeon: CElam Dutch MD;  [place] [facility]  Service: Vascular;  Laterality: Right;  . INTRAOPERATIVE ARTERIOGRAM  08/12/2014   Procedure: INTRA OPERATIVE ARTERIOGRAM;  Surgeon: CElam Dutch MD;  [place] [facility]  Service: Vascular;;  . POLYPECTOMY  10/06/2017   Procedure: POLYPECTOMY;  Surgeon: RDaneil Dolin MD;  [facility];  Service: Endoscopy;;  splenic flexure,  ascending  . PORTACATH PLACEMENT N/A 01/24/2018   Procedure: INSERTION PORT-A-CATH;  Surgeon: BStark Klein MD;  [place] [facility]  Service: General;  Laterality: N/A;  . PR VEIN BYPASS GRAFT,AORTO-FEM-POP    . stents in bil legs      Family History Family History  Problem Relation Age of Onset  . Diabetes Mother   . Heart disease Mother        before age 55 Triple BPG  . Cancer Mother        Luekemia  . Deep vein thrombosis Mother   . Hyperlipidemia Mother   . Hypertension Mother   . Heart attack Mother   . Peripheral vascular disease Mother   . Stroke Mother        x 3  . Pancreatic cancer Mother   . Arthritis Father   . Hypertension Father   . Kidney disease Father   . Cancer Maternal Grandmother        NOS  . Diabetes Cousin   . Cancer Cousin        NOS, pat first cousin  . Cancer Cousin        NOS, pat first cousin  . Colon cancer Neg Hx   . Gastric cancer Neg Hx   . Esophageal cancer Neg Hx      Social History  reports that he has been smoking cigars.  He started smoking about 43 years ago. He has a 25.00 pack-year smoking history. He has never used smokeless tobacco. He reports that he has current or past drug history. Drug: Marijuana. He reports that he does not drink alcohol.  Medications  Current Outpatient  Medications:  .  amLODipine (NORVASC) 10 MG tablet, TAKE ONE TABLET BY MOUTH ONCE DAILY, Disp: 90 tablet, Rfl: 3 .  aspirin EC 81 MG tablet, Take 81 mg by mouth daily., Disp: , Rfl:  .  lisinopril (PRINIVIL,ZESTRIL) 40 MG tablet, Take 40 mg by mouth at bedtime., Disp: , Rfl:  .  lubiprostone (AMITIZA) 8 MCG capsule, Take 1 capsule (8 mcg total) by mouth 2 (two) times daily with a meal., Disp:  60 capsule, Rfl: 11 .  rosuvastatin (CRESTOR) 10 MG tablet, TAKE ONE TABLET BY MOUTH ONCE DAILY (Patient taking differently: TAKE 10 MG BY MOUTH ONCE DAILY AT BEDTIME), Disp: 90 tablet, Rfl: 3 .  valACYclovir (VALTREX) 500 MG tablet, Take 500 mg by mouth at bedtime., Disp: , Rfl:  .  dexamethasone (DECADRON) 4 MG tablet, Take 2 tablets (8 mg total) by mouth daily. Start the day after chemotherapy for 2 days. Take with food. (Patient not taking: Reported on 02/07/2018), Disp: 30 tablet, Rfl: 1 .  DOCEtaxel (TAXOTERE IV), Inject into the vein., Disp: , Rfl:  .  fluorouracil CALGB 49675 in sodium chloride 0.9 % 150 mL, Inject into the vein over 24 hr., Disp: , Rfl:  .  LEUCOVORIN CALCIUM IV, Inject into the vein., Disp: , Rfl:  .  lidocaine-prilocaine (EMLA) cream, Apply to affected area once (Patient not taking: Reported on 02/07/2018), Disp: 30 g, Rfl: 3 .  ondansetron (ZOFRAN) 8 MG tablet, Take 1 tablet (8 mg total) by mouth 2 (two) times daily as needed for refractory nausea / vomiting. Start on day 3 after chemotherapy., Disp: 30 tablet, Rfl: 1 .  OXALIPLATIN IV, Inject into the vein., Disp: , Rfl:  .  oxyCODONE (OXY IR/ROXICODONE) 5 MG immediate release tablet, Take 1 tablet (5 mg total) by mouth every 6 (six) hours as needed for severe pain., Disp: 20 tablet, Rfl: 0 .  pantoprazole (PROTONIX) 40 MG tablet, TAKE 1 TABLET BY MOUTH TWICE DAILY BEFORE A MEAL, Disp: 60 tablet, Rfl: 3 .  prochlorperazine (COMPAZINE) 10 MG tablet, Take 1 tablet (10 mg total) by mouth every 6 (six) hours as needed (Nausea or  vomiting)., Disp: 30 tablet, Rfl: 1 .  vitamin B-12 (CYANOCOBALAMIN) 1000 MCG tablet, Take 1,000 mcg by mouth daily., Disp: , Rfl:   Allergies Potassium-containing compounds  Review of Systems Review of Systems - Oncology ROS as per HPI otherwise 12 point ROS is negative.   Physical Exam Vitals  T 98.5 HR 84 RR 20 BP 124/80 pulse ox 100% on room air Wt Readings from Last 3 Encounters:  02/08/18 191 lb (86.6 kg)  02/07/18 191 lb (86.6 kg)  01/31/18 197 lb 14.4 oz (89.8 kg)   Temp Readings from Last 3 Encounters:  02/08/18 98.3 F (36.8 C) (Oral)  02/07/18 98.4 F (36.9 C) (Oral)  02/01/18 98.4 F (36.9 C) (Oral)   BP Readings from Last 3 Encounters:  02/08/18 103/78  02/07/18 110/76  02/01/18 130/80   Pulse Readings from Last 3 Encounters:  02/08/18 96  02/07/18 78  02/01/18 84   Constitutional: Well-developed, well-nourished, and in no distress.   HENT: Head: Normocephalic and atraumatic.  Mouth/Throat: No oropharyngeal exudate. Mucosa moist. Eyes: Pupils are equal, round, and reactive to light. Conjunctivae are normal. No scleral icterus.  Neck: Normal range of motion. Neck supple. No JVD present.  Cardiovascular: Normal rate, regular rhythm and normal heart sounds.  Exam reveals no gallop and no friction rub.   No murmur heard. Pulmonary/Chest: Effort normal and breath sounds normal. No respiratory distress. No wheezes.No rales.  Abdominal: Soft. Bowel sounds are normal. No distension. There is no tenderness. There is no guarding.  Musculoskeletal: No edema or tenderness.  Lymphadenopathy:No cervical, axillary or supraclavicular adenopathy.  Neurological: Alert and oriented to person, place, and time. No cranial nerve deficit.  Skin: Skin is warm and dry. No rash noted. No erythema. No pallor.  Psychiatric: Affect and judgment normal.   Labs No visits with  results within 3 Day(s) from this visit.  Latest known visit with results is:  Hospital Outpatient Visit  on 10/24/2017  Component Date Value Ref Range Status  . Glucose-Capillary 10/24/2017 90  65 - 99 mg/dL Final     Pathology Orders Placed This Encounter  Procedures  . CBC with Differential/Platelet    Standing Status:   Future    Number of Occurrences:   1    Standing Expiration Date:   01/18/2019  . Comprehensive metabolic panel    Standing Status:   Future    Number of Occurrences:   1    Standing Expiration Date:   01/18/2019  . Lactate dehydrogenase    Standing Status:   Future    Number of Occurrences:   1    Standing Expiration Date:   01/18/2019  . Ferritin    Standing Status:   Future    Number of Occurrences:   1    Standing Expiration Date:   01/18/2019  . CEA    Standing Status:   Future    Number of Occurrences:   1    Standing Expiration Date:   01/18/2019       Zoila Shutter MD

## 2018-02-14 ENCOUNTER — Encounter: Payer: Self-pay | Admitting: General Practice

## 2018-02-14 ENCOUNTER — Encounter (HOSPITAL_COMMUNITY): Payer: Self-pay

## 2018-02-14 ENCOUNTER — Inpatient Hospital Stay (HOSPITAL_COMMUNITY): Payer: Medicare Other

## 2018-02-14 ENCOUNTER — Inpatient Hospital Stay (HOSPITAL_COMMUNITY): Payer: Medicare Other | Admitting: Dietician

## 2018-02-14 VITALS — BP 137/85 | HR 73 | Temp 97.9°F | Resp 18 | Wt 190.8 lb

## 2018-02-14 DIAGNOSIS — I10 Essential (primary) hypertension: Secondary | ICD-10-CM | POA: Diagnosis not present

## 2018-02-14 DIAGNOSIS — Z5189 Encounter for other specified aftercare: Secondary | ICD-10-CM | POA: Diagnosis not present

## 2018-02-14 DIAGNOSIS — Z5111 Encounter for antineoplastic chemotherapy: Secondary | ICD-10-CM | POA: Diagnosis not present

## 2018-02-14 DIAGNOSIS — I714 Abdominal aortic aneurysm, without rupture: Secondary | ICD-10-CM | POA: Diagnosis not present

## 2018-02-14 DIAGNOSIS — C169 Malignant neoplasm of stomach, unspecified: Secondary | ICD-10-CM

## 2018-02-14 DIAGNOSIS — Z800 Family history of malignant neoplasm of digestive organs: Secondary | ICD-10-CM | POA: Diagnosis not present

## 2018-02-14 DIAGNOSIS — Z79899 Other long term (current) drug therapy: Secondary | ICD-10-CM | POA: Diagnosis not present

## 2018-02-14 DIAGNOSIS — I700 Atherosclerosis of aorta: Secondary | ICD-10-CM | POA: Diagnosis not present

## 2018-02-14 DIAGNOSIS — Z809 Family history of malignant neoplasm, unspecified: Secondary | ICD-10-CM | POA: Diagnosis not present

## 2018-02-14 DIAGNOSIS — Z452 Encounter for adjustment and management of vascular access device: Secondary | ICD-10-CM | POA: Diagnosis not present

## 2018-02-14 DIAGNOSIS — K521 Toxic gastroenteritis and colitis: Secondary | ICD-10-CM | POA: Diagnosis not present

## 2018-02-14 DIAGNOSIS — Z7982 Long term (current) use of aspirin: Secondary | ICD-10-CM | POA: Diagnosis not present

## 2018-02-14 DIAGNOSIS — K1379 Other lesions of oral mucosa: Secondary | ICD-10-CM

## 2018-02-14 DIAGNOSIS — C163 Malignant neoplasm of pyloric antrum: Secondary | ICD-10-CM

## 2018-02-14 LAB — COMPREHENSIVE METABOLIC PANEL
ALBUMIN: 3.8 g/dL (ref 3.5–5.0)
ALK PHOS: 111 U/L (ref 38–126)
ALT: 16 U/L — AB (ref 17–63)
AST: 15 U/L (ref 15–41)
Anion gap: 9 (ref 5–15)
BUN: 18 mg/dL (ref 6–20)
CALCIUM: 9 mg/dL (ref 8.9–10.3)
CHLORIDE: 103 mmol/L (ref 101–111)
CO2: 25 mmol/L (ref 22–32)
CREATININE: 1.28 mg/dL — AB (ref 0.61–1.24)
GFR calc Af Amer: >60 mL/min (ref >60)
GFR calc non Af Amer: >60 mL/min (ref >60)
GLUCOSE: 100 mg/dL — AB (ref 65–99)
Potassium: 3.5 mmol/L (ref 3.5–5.1)
SODIUM: 137 mmol/L (ref 135–145)
Total Bilirubin: 0.4 mg/dL (ref 0.3–1.2)
Total Protein: 7.1 g/dL (ref 6.5–8.1)

## 2018-02-14 LAB — CBC WITH DIFFERENTIAL/PLATELET
Basophils Absolute: 0.1 10*3/uL (ref 0.0–0.1)
Basophils Relative: 0 %
Eosinophils Absolute: 0 10*3/uL (ref 0.0–0.7)
Eosinophils Relative: 0 %
HCT: 36.3 % — ABNORMAL LOW (ref 39.0–52.0)
HEMOGLOBIN: 12.1 g/dL — AB (ref 13.0–17.0)
LYMPHS ABS: 2.5 10*3/uL (ref 0.7–4.0)
LYMPHS PCT: 7 %
MCH: 29.1 pg (ref 26.0–34.0)
MCHC: 33.3 g/dL (ref 30.0–36.0)
MCV: 87.3 fL (ref 78.0–100.0)
Monocytes Absolute: 1.6 10*3/uL — ABNORMAL HIGH (ref 0.1–1.0)
Monocytes Relative: 4 %
NEUTROS ABS: 32.9 10*3/uL (ref 1.7–7.7)
NEUTROS PCT: 89 %
PLATELETS: 140 10*3/uL — AB (ref 150–400)
RBC: 4.16 MIL/uL — ABNORMAL LOW (ref 4.22–5.81)
RDW: 15.2 % (ref 11.5–15.5)
WBC: 37.1 10*3/uL — ABNORMAL HIGH (ref 4.0–10.5)

## 2018-02-14 MED ORDER — PALONOSETRON HCL INJECTION 0.25 MG/5ML
INTRAVENOUS | Status: AC
  Filled 2018-02-14: qty 5

## 2018-02-14 MED ORDER — SODIUM CHLORIDE 0.9 % IV SOLN: 10.0000 mg | Freq: Once | INTRAVENOUS | Status: DC

## 2018-02-14 MED ORDER — SODIUM CHLORIDE 0.9 % IV SOLN
50.0000 mg/m2 | Freq: Once | INTRAVENOUS | Status: AC
  Administered 2018-02-14: 110 mg via INTRAVENOUS
  Filled 2018-02-14: qty 11

## 2018-02-14 MED ORDER — MAGIC MOUTHWASH W/LIDOCAINE: 10.0000 mL | Freq: Three times a day (TID) | ORAL | 1 refills | Status: DC | PRN

## 2018-02-14 MED ORDER — OXALIPLATIN CHEMO INJECTION 100 MG/20ML
85.0000 mg/m2 | Freq: Once | INTRAVENOUS | Status: AC
  Administered 2018-02-14: 180 mg via INTRAVENOUS
  Filled 2018-02-14: qty 36

## 2018-02-14 MED ORDER — DIPHENHYDRAMINE HCL 50 MG/ML IJ SOLN
25.0000 mg | Freq: Once | INTRAMUSCULAR | Status: AC
  Administered 2018-02-14: 25 mg via INTRAVENOUS

## 2018-02-14 MED ORDER — DEXAMETHASONE SODIUM PHOSPHATE 10 MG/ML IJ SOLN
INTRAMUSCULAR | Status: AC
  Filled 2018-02-14: qty 1

## 2018-02-14 MED ORDER — SODIUM CHLORIDE 0.9% FLUSH
10.0000 mL | INTRAVENOUS | Status: DC | PRN
  Administered 2018-02-14: 10 mL
  Filled 2018-02-14: qty 10

## 2018-02-14 MED ORDER — DIPHENHYDRAMINE HCL 50 MG/ML IJ SOLN
INTRAMUSCULAR | Status: AC
  Filled 2018-02-14: qty 1

## 2018-02-14 MED ORDER — LEUCOVORIN CALCIUM INJECTION 350 MG
400.0000 mg | Freq: Once | INTRAVENOUS | Status: AC
  Administered 2018-02-14: 400 mg via INTRAVENOUS
  Filled 2018-02-14: qty 17.5

## 2018-02-14 MED ORDER — DEXAMETHASONE SODIUM PHOSPHATE 10 MG/ML IJ SOLN
10.0000 mg | Freq: Once | INTRAMUSCULAR | Status: AC
  Administered 2018-02-14: 10 mg via INTRAVENOUS

## 2018-02-14 MED ORDER — DEXTROSE 5 % IV SOLN
Freq: Once | INTRAVENOUS | Status: AC
  Administered 2018-02-14: 500 mL via INTRAVENOUS

## 2018-02-14 MED ORDER — PALONOSETRON HCL INJECTION 0.25 MG/5ML
0.2500 mg | Freq: Once | INTRAVENOUS | Status: AC
  Administered 2018-02-14: 0.25 mg via INTRAVENOUS

## 2018-02-14 MED ORDER — SODIUM CHLORIDE 0.9 % IV SOLN
2600.0000 mg/m2 | INTRAVENOUS | Status: DC
  Administered 2018-02-14: 5550 mg via INTRAVENOUS
  Filled 2018-02-14: qty 100

## 2018-02-14 NOTE — Progress Notes (Addendum)
Nutrition Initial Assessment  Reason for Assessment: MST indicates unintentional wt loss and decreased intake r/t poor appetite.   ASSESSMENT: 57 y/o male PMHx CAD, CKD2, PVD, CVA, Tobacco abuse, HTN/HLD. Developed acute epigastric pain w/ n/v and globus sensation Dec of 2018. 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 PET confirmed hypermetabolic, ultimately staged uT4aN0. Undergoing neoadjuvant chemo w/ tentative plans to undergo gastrectomy (partial vs total).   Patient seen with 2 other family members/friends.   Able to obtain only limited information from patient as he is a poor historian and gets off topic quite easily  He says he has had a decrease in appetite since he began treatment. He has intermittent nausea, but other than that, has tolerated chemotherapy well. He notes a loss of ~15 lbs since he began chemotherapy and says his UBW is ~200-205 lbs.    He says he was happy to lose weight because "my ideal body weight is 185". He says he does not eat during the day or before bed "because if you eat food before bed it turns to fat". He avoids items such as hamburgers due to fat content. He drinks only water. He notes the majority of his diet consists of soup, oatmeal, PB and eggs.   He does not currently consume Ensure/Boost as these are cost prohibitive.   Wt Readings from Last 10 Encounters:  02/14/18 190 lb 12.8 oz (86.5 kg)  02/08/18 191 lb (86.6 kg)  02/07/18 191 lb (86.6 kg)  01/31/18 197 lb 14.4 oz (89.8 kg)  01/24/18 191 lb (86.6 kg)  01/17/18 195 lb 14.4 oz (88.9 kg)  12/15/17 197 lb 11.2 oz (89.7 kg)  11/28/17 203 lb (92.1 kg)  11/24/17 194 lb (88 kg)  10/19/17 201 lb (91.2 kg)   NUTRITION - FOCUSED PHYSICAL EXAM: Mild-moderate muscle/fat wasting of face and arms. Unable to assess other areas (in street clothes).   MEDICATIONS:  Chemo: 5FU, Taxotere, oxaliplatin,  leucovorin Decadron (day after chemo x2 days) Supportive meds: compazine, ppi, zofran, magic mouthwash, oxycodone, b12  LABS:  Wbc: 37.1, h/h:12.1/36.3 Creat 1.28, up from 1.19 1 week ago. Other WDL  Recent Labs  Lab 02/14/18 0809  NA 137  K 3.5  CL 103  CO2 25  BUN 18  CREATININE 1.28*  CALCIUM 9.0  GLUCOSE 100*   ANTHROPOMETRICS: Height: 6'1" (185.42 cm) Weight: 190 lbs 13 oz (86.73 kg) BMI: 25.18-healthy wt for ht UBW: 200 IBW: 184 lbs  ESTIMATED ENERGY NEEDS:  Kcal: 2400-2600 (28-30 kcal/kg bw) Protein: 110-130g Pro (1.3-1.5 g/kg bw) Fluid: 2.6 L (30 ml/kg bw)   NUTRITION DIAGNOSIS:  Inadequate oral intake r/t decreased appetite and lack of knowledge/understanding AEB loss of 8 lbs x 2 weeks.   DOCUMENTATION CODES:  Moderate malnutrition, acute (on chronic. Criteria listed below  INTERVENTION:  RD spent much of meeting attempting to dispel the patients misconceptions regarding his eating behaviors and weight. Reviewed how weight loss is never recommended during cancer treatment, even if he had been slightly overweight prior. He is agreeable to maintaining his weight now that he has gotten to "where I should be".   Discussed with pt the basics of nutrition therapy during treatment: minimize side effects w/ diet, prioritize eating high kcal/protein foods as able and eating frequently. All of this is done in attempt to maintain weight and, by association, improve outcomes.   We reviewed handout titled "increasing calories and protein". RD noted  relevant foods that were high in calories/protein and the instruction of eating every few hrs.   Dispelled notion that eating before bed will cause him to gain weight. He sounds to be only eating a dinner and a very early breakfast (due to insomnia). He needs to minimize periods of fasting.   RD reviewed the Ensure Assistance program. He is requesting his 1st case today. He is eligible for 2 more.   He is provided with coupons  for supplements as well as RD contact information.   Patient meets criteria for moderate malnutrition in acute on chronic context. Criteria met are mild-moderate fat/muscle wasting and a wt loss of 8 lbs in 2 weeks.   RD will plan to f/u in 2 weeks.   GOAL:  Oral intake to meet >90% of needs, wt stability, increased knowledge of appropriate eating habits  MONITOR:  Weight, tolerance of diet/chemo, evidence of education comprehension  Next Visit: 6/4 in chemo  [name] RD, LDN, CNSC Clinical Nutrition Available Tues-Sat via Pager: [phone] 02/14/2018 11:55 AM

## 2018-02-14 NOTE — Progress Notes (Signed)
Patient to treatment room for chemotherapy.  Patient stated he was in the emergency room 02/10/2018 for "tongue swelling" but did not stay to be evaluated by the physician.  Stated he left after waiting 2 hours to be seen.   Patient also stated the chemotherapy was "burning" him and pointed out a fifty cent size peeling of skin on his right anterior arm.  Reviewed cold sensitivities with the patient and reviewed the symptoms with the oncologist.   No s/s of distress noted and family at side.  Ok to treat today and add Benadryl 25mg  IV to treatment today verbal order and to call in magic mouth wash.  Hold Neulasta Onpro today verbal order Dr. Walden Field.     Dr. Walden Field in to see the patient concerning symptoms.  Patient instructed by Dr. Walden Field to take his dexamethasone the day before, day of, and the day after treatment.  Patient also instructed to use magic mouthwash and benadryl per Dr. Walden Field.  Patient verbalized understanding.    Patient tolerated chemotherapy with no complaints voiced.  Port site clean and dry with no bruising or swelling noted.  Dressing intact.  Patient denied respiratory issues or feelings of tongue swelling.  Reinforced patient teaching by Dr. Walden Field with understanding verbalized by the patient and family.  Discharged with VSS and no s/s of distress noted.

## 2018-02-14 NOTE — Patient Instructions (Signed)
[facility] Discharge Instructions for Patients Receiving Chemotherapy  Today you received the following chemotherapy agents taxotere, oxaliplatin, leucovorin, and adruicil.   Take magic mouth wash and benadryl as directed.  Take you dexamethasone as directed.  Call for any questions or concerns.     If you develop nausea and vomiting that is not controlled by your nausea medication, call the clinic.   BELOW ARE SYMPTOMS THAT SHOULD BE REPORTED IMMEDIATELY:  *FEVER GREATER THAN 100.5 F  *CHILLS WITH OR WITHOUT FEVER  NAUSEA AND VOMITING THAT IS NOT CONTROLLED WITH YOUR NAUSEA MEDICATION  *UNUSUAL SHORTNESS OF BREATH  *UNUSUAL BRUISING OR BLEEDING  TENDERNESS IN MOUTH AND THROAT WITH OR WITHOUT PRESENCE OF ULCERS  *URINARY PROBLEMS  *BOWEL PROBLEMS  UNUSUAL RASH Items with * indicate a potential emergency and should be followed up as soon as possible.  Feel free to call the clinic should you have any questions or concerns. The clinic phone number is [phone].  Please show the [facility] at check-in to the Emergency Department and triage nurse.

## 2018-02-14 NOTE — Progress Notes (Signed)
[facility] CSW Progress Notes  Brief visit for support/resources in infusion. Patient accompanied by support person, no needs at this time.  Encouraged participation in support group.  Edwyna Shell, LCSW Clinical Social Worker Phone:  [phone]

## 2018-02-15 ENCOUNTER — Inpatient Hospital Stay (HOSPITAL_COMMUNITY): Payer: Medicare Other

## 2018-02-15 VITALS — BP 140/77 | HR 70 | Resp 16

## 2018-02-15 DIAGNOSIS — K521 Toxic gastroenteritis and colitis: Secondary | ICD-10-CM | POA: Diagnosis not present

## 2018-02-15 DIAGNOSIS — Z800 Family history of malignant neoplasm of digestive organs: Secondary | ICD-10-CM | POA: Diagnosis not present

## 2018-02-15 DIAGNOSIS — Z5189 Encounter for other specified aftercare: Secondary | ICD-10-CM | POA: Diagnosis not present

## 2018-02-15 DIAGNOSIS — C169 Malignant neoplasm of stomach, unspecified: Secondary | ICD-10-CM | POA: Diagnosis not present

## 2018-02-15 DIAGNOSIS — Z5111 Encounter for antineoplastic chemotherapy: Secondary | ICD-10-CM | POA: Diagnosis not present

## 2018-02-15 DIAGNOSIS — I714 Abdominal aortic aneurysm, without rupture: Secondary | ICD-10-CM | POA: Diagnosis not present

## 2018-02-15 DIAGNOSIS — I10 Essential (primary) hypertension: Secondary | ICD-10-CM | POA: Diagnosis not present

## 2018-02-15 DIAGNOSIS — Z79899 Other long term (current) drug therapy: Secondary | ICD-10-CM | POA: Diagnosis not present

## 2018-02-15 DIAGNOSIS — Z7982 Long term (current) use of aspirin: Secondary | ICD-10-CM | POA: Diagnosis not present

## 2018-02-15 DIAGNOSIS — Z452 Encounter for adjustment and management of vascular access device: Secondary | ICD-10-CM | POA: Diagnosis not present

## 2018-02-15 DIAGNOSIS — Z809 Family history of malignant neoplasm, unspecified: Secondary | ICD-10-CM | POA: Diagnosis not present

## 2018-02-15 DIAGNOSIS — I700 Atherosclerosis of aorta: Secondary | ICD-10-CM | POA: Diagnosis not present

## 2018-02-15 MED ORDER — HEPARIN SOD (PORK) LOCK FLUSH 100 UNIT/ML IV SOLN: 500.0000 [IU] | Freq: Once | INTRAVENOUS | Status: DC | PRN

## 2018-02-15 MED ORDER — SODIUM CHLORIDE 0.9% FLUSH: 10.0000 mL | INTRAVENOUS | Status: DC | PRN

## 2018-02-16 ENCOUNTER — Other Ambulatory Visit: Payer: Self-pay

## 2018-02-16 ENCOUNTER — Encounter (HOSPITAL_COMMUNITY): Payer: Self-pay

## 2018-02-16 NOTE — Progress Notes (Signed)
Todd [name] returns today for port de access and flush after 46 hr continous infusion of 61fu. Tolerated infusion without problems. Portacath located left chest wall was deaccessed and flushed with 17ml NS and 500U/28ml Heparin and needle removed intact.  Procedure without incident. Patient tolerated procedure well.  Neulasta onpro held per DR. Higgs.  Vitals stable and discharged home from clinic ambulatory. Follow up as scheduled.

## 2018-02-16 NOTE — Patient Instructions (Signed)
[place] at [facility] Discharge Instructions  Port disconnected today. Follow up as scheduled.   Thank you for choosing [place] at [facility] to provide your oncology and hematology care.  To afford each patient quality time with our provider, please arrive at least 15 minutes before your scheduled appointment time.   If you have a lab appointment with the [facility] please come in thru the  Main Entrance and check in at the main information desk  You need to re-schedule your appointment should you arrive 10 or more minutes late.  We strive to give you quality time with our providers, and arriving late affects you and other patients whose appointments are after yours.  Also, if you no show three or more times for appointments you may be dismissed from the clinic at the providers discretion.     Again, thank you for choosing [facility].  Our hope is that these requests will decrease the amount of time that you wait before being seen by our physicians.       _____________________________________________________________  Should you have questions after your visit to [facility], please contact our office at [phone] between the hours of 8:30 a.m. and 4:30 p.m.  Voicemails left after 4:30 p.m. will not be returned until the following business day.  For prescription refill requests, have your pharmacy contact our office.       Resources For Cancer Patients and their Caregivers ? American [employer] Society: Can assist with transportation, wigs, general needs, runs Look Good Feel Better.        (253)887-8622 ? Cancer Care: Provides financial assistance, online support groups, medication/co-pay assistance.  1-800-813-HOPE 816 821 2237) ? [employer] cancer patients and their families through emotional , educational and financial support.  (234)745-6682 ? [place]  DSS Where to apply for food stamps, Medicaid and utility assistance. 813-579-2592 ? [facility]: Transportation to medical appointments. (249)791-9549 ? Social Security Administration: May apply for disability if have a Stage IV cancer. 986-087-2262 3435108970 ? [employer], Disability and Transit Services: Assists with nutrition, care and transit needs. [place] Support Programs:   > Cancer Support Group  2nd Tuesday of the month 1pm-2pm, Journey Room   > Creative Journey  3rd Tuesday of the month 1130am-1pm, Journey Room

## 2018-02-23 DIAGNOSIS — R569 Unspecified convulsions: Secondary | ICD-10-CM | POA: Diagnosis not present

## 2018-02-23 DIAGNOSIS — I959 Hypotension, unspecified: Secondary | ICD-10-CM | POA: Diagnosis not present

## 2018-02-28 ENCOUNTER — Encounter (HOSPITAL_COMMUNITY): Payer: Self-pay | Admitting: Internal Medicine

## 2018-02-28 ENCOUNTER — Inpatient Hospital Stay (HOSPITAL_BASED_OUTPATIENT_CLINIC_OR_DEPARTMENT_OTHER): Payer: Medicare Other | Admitting: Internal Medicine

## 2018-02-28 ENCOUNTER — Other Ambulatory Visit: Payer: Self-pay

## 2018-02-28 ENCOUNTER — Inpatient Hospital Stay (HOSPITAL_COMMUNITY): Payer: Medicare Other

## 2018-02-28 ENCOUNTER — Inpatient Hospital Stay (HOSPITAL_COMMUNITY): Payer: Medicare Other | Attending: Hematology

## 2018-02-28 ENCOUNTER — Inpatient Hospital Stay (HOSPITAL_COMMUNITY): Payer: Medicare Other | Admitting: Dietician

## 2018-02-28 VITALS — BP 132/81 | HR 81 | Temp 98.7°F | Resp 18 | Wt 184.6 lb

## 2018-02-28 DIAGNOSIS — R197 Diarrhea, unspecified: Secondary | ICD-10-CM

## 2018-02-28 DIAGNOSIS — C165 Malignant neoplasm of lesser curvature of stomach, unspecified: Secondary | ICD-10-CM | POA: Diagnosis not present

## 2018-02-28 DIAGNOSIS — F17208 Nicotine dependence, unspecified, with other nicotine-induced disorders: Secondary | ICD-10-CM | POA: Diagnosis not present

## 2018-02-28 DIAGNOSIS — K1379 Other lesions of oral mucosa: Secondary | ICD-10-CM | POA: Diagnosis not present

## 2018-02-28 DIAGNOSIS — I714 Abdominal aortic aneurysm, without rupture: Secondary | ICD-10-CM | POA: Insufficient documentation

## 2018-02-28 DIAGNOSIS — Z7982 Long term (current) use of aspirin: Secondary | ICD-10-CM | POA: Diagnosis not present

## 2018-02-28 DIAGNOSIS — C169 Malignant neoplasm of stomach, unspecified: Secondary | ICD-10-CM

## 2018-02-28 DIAGNOSIS — C163 Malignant neoplasm of pyloric antrum: Secondary | ICD-10-CM

## 2018-02-28 DIAGNOSIS — I10 Essential (primary) hypertension: Secondary | ICD-10-CM

## 2018-02-28 DIAGNOSIS — Z79899 Other long term (current) drug therapy: Secondary | ICD-10-CM | POA: Diagnosis not present

## 2018-02-28 DIAGNOSIS — K581 Irritable bowel syndrome with constipation: Secondary | ICD-10-CM | POA: Diagnosis not present

## 2018-02-28 DIAGNOSIS — F17210 Nicotine dependence, cigarettes, uncomplicated: Secondary | ICD-10-CM | POA: Diagnosis not present

## 2018-02-28 DIAGNOSIS — Z800 Family history of malignant neoplasm of digestive organs: Secondary | ICD-10-CM | POA: Diagnosis not present

## 2018-02-28 DIAGNOSIS — Z5111 Encounter for antineoplastic chemotherapy: Secondary | ICD-10-CM | POA: Insufficient documentation

## 2018-02-28 DIAGNOSIS — Z809 Family history of malignant neoplasm, unspecified: Secondary | ICD-10-CM | POA: Diagnosis not present

## 2018-02-28 LAB — COMPREHENSIVE METABOLIC PANEL
ALT: 22 U/L (ref 17–63)
ANION GAP: 8 (ref 5–15)
AST: 21 U/L (ref 15–41)
Albumin: 3.7 g/dL (ref 3.5–5.0)
Alkaline Phosphatase: 65 U/L (ref 38–126)
BILIRUBIN TOTAL: 0.5 mg/dL (ref 0.3–1.2)
BUN: 8 mg/dL (ref 6–20)
CALCIUM: 9.3 mg/dL (ref 8.9–10.3)
CO2: 26 mmol/L (ref 22–32)
Chloride: 104 mmol/L (ref 101–111)
Creatinine, Ser: 1.03 mg/dL (ref 0.61–1.24)
Glucose, Bld: 91 mg/dL (ref 65–99)
POTASSIUM: 3.6 mmol/L (ref 3.5–5.1)
Sodium: 138 mmol/L (ref 135–145)
TOTAL PROTEIN: 6.9 g/dL (ref 6.5–8.1)

## 2018-02-28 LAB — CBC WITH DIFFERENTIAL/PLATELET
BASOS PCT: 0 %
Basophils Absolute: 0 10*3/uL (ref 0.0–0.1)
EOS ABS: 0.1 10*3/uL (ref 0.0–0.7)
EOS PCT: 2 %
HCT: 35.5 % — ABNORMAL LOW (ref 39.0–52.0)
Hemoglobin: 11.7 g/dL — ABNORMAL LOW (ref 13.0–17.0)
Lymphocytes Relative: 44 %
Lymphs Abs: 2.6 10*3/uL (ref 0.7–4.0)
MCH: 28.9 pg (ref 26.0–34.0)
MCHC: 33 g/dL (ref 30.0–36.0)
MCV: 87.7 fL (ref 78.0–100.0)
Monocytes Absolute: 0.9 10*3/uL (ref 0.1–1.0)
Monocytes Relative: 16 %
NEUTROS PCT: 38 %
Neutro Abs: 2.2 10*3/uL (ref 1.7–7.7)
Platelets: 145 10*3/uL — ABNORMAL LOW (ref 150–400)
RBC: 4.05 MIL/uL — ABNORMAL LOW (ref 4.22–5.81)
RDW: 15 % (ref 11.5–15.5)
WBC: 5.8 10*3/uL (ref 4.0–10.5)

## 2018-02-28 LAB — LACTATE DEHYDROGENASE: LDH: 201 U/L — AB (ref 98–192)

## 2018-02-28 MED ORDER — DEXAMETHASONE SODIUM PHOSPHATE 10 MG/ML IJ SOLN
10.0000 mg | Freq: Once | INTRAMUSCULAR | Status: AC
  Administered 2018-02-28: 10 mg via INTRAVENOUS

## 2018-02-28 MED ORDER — LEUCOVORIN CALCIUM INJECTION 350 MG
400.0000 mg | Freq: Once | INTRAVENOUS | Status: AC
  Administered 2018-02-28: 400 mg via INTRAVENOUS
  Filled 2018-02-28: qty 17.5

## 2018-02-28 MED ORDER — LOPERAMIDE HCL 2 MG PO CAPS: 2.0000 mg | ORAL_CAPSULE | ORAL | 0 refills | Status: DC | PRN

## 2018-02-28 MED ORDER — SODIUM CHLORIDE 0.9 % IV SOLN: 10.0000 mg | Freq: Once | INTRAVENOUS | Status: DC

## 2018-02-28 MED ORDER — DIPHENHYDRAMINE HCL 50 MG/ML IJ SOLN
INTRAMUSCULAR | Status: AC
  Filled 2018-02-28: qty 1

## 2018-02-28 MED ORDER — PALONOSETRON HCL INJECTION 0.25 MG/5ML
0.2500 mg | Freq: Once | INTRAVENOUS | Status: AC
  Administered 2018-02-28: 0.25 mg via INTRAVENOUS

## 2018-02-28 MED ORDER — DIPHENHYDRAMINE HCL 50 MG/ML IJ SOLN
25.0000 mg | Freq: Once | INTRAMUSCULAR | Status: AC
  Administered 2018-02-28: 25 mg via INTRAVENOUS

## 2018-02-28 MED ORDER — PALONOSETRON HCL INJECTION 0.25 MG/5ML
INTRAVENOUS | Status: AC
  Filled 2018-02-28: qty 5

## 2018-02-28 MED ORDER — DEXTROSE 5 % IV SOLN
Freq: Once | INTRAVENOUS | Status: AC
  Administered 2018-02-28: 250 mL via INTRAVENOUS

## 2018-02-28 MED ORDER — SODIUM CHLORIDE 0.9 % IV SOLN
50.0000 mg/m2 | Freq: Once | INTRAVENOUS | Status: AC
  Administered 2018-02-28: 110 mg via INTRAVENOUS
  Filled 2018-02-28: qty 11

## 2018-02-28 MED ORDER — OXALIPLATIN CHEMO INJECTION 100 MG/20ML
85.0000 mg/m2 | Freq: Once | INTRAVENOUS | Status: AC
  Administered 2018-02-28: 180 mg via INTRAVENOUS
  Filled 2018-02-28: qty 36

## 2018-02-28 MED ORDER — SODIUM CHLORIDE 0.9 % IV SOLN
2600.0000 mg/m2 | INTRAVENOUS | Status: DC
  Administered 2018-02-28: 5550 mg via INTRAVENOUS
  Filled 2018-02-28: qty 101

## 2018-02-28 MED ORDER — SODIUM CHLORIDE 0.9% FLUSH
10.0000 mL | INTRAVENOUS | Status: DC | PRN
  Administered 2018-02-28: 10 mL
  Filled 2018-02-28: qty 10

## 2018-02-28 MED ORDER — DEXAMETHASONE SODIUM PHOSPHATE 10 MG/ML IJ SOLN
INTRAMUSCULAR | Status: AC
  Filled 2018-02-28: qty 1

## 2018-02-28 NOTE — Progress Notes (Signed)
Diagnosis Gastric carcinoma ([place]) - Plan: CBC with Differential/Platelet, Comprehensive metabolic panel, Lactate dehydrogenase, NM PET Image Restag (PS) Skull Base To Thigh, DISCONTINUED: dextrose 5 % solution, DISCONTINUED: sodium chloride flush (NS) 0.9 % injection 10 mL, DISCONTINUED: palonosetron (ALOXI) injection 0.25 mg, DISCONTINUED: dexamethasone (DECADRON) 10 mg in sodium chloride 0.9 % 50 mL IVPB, DISCONTINUED: DOCEtaxel (TAXOTERE) 110 mg in sodium chloride 0.9 % 250 mL chemo infusion, DISCONTINUED: oxaliplatin (ELOXATIN) 180 mg in dextrose 5 % 500 mL chemo infusion, DISCONTINUED: leucovorin 428 mg in dextrose 5 % 250 mL infusion, DISCONTINUED: fluorouracil (ADRUCIL) 5,550 mg in sodium chloride 0.9 % 139 mL chemo infusion  Staging Cancer Staging No matching staging information was found for the patient.  Assessment and Plan:  1.  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  57 y.o. male initially seen by Dr. Sherrine Maples and reported he was in his usual state of health until around Christmas 2018 when he went camping in [place].  During that trip patient apparently had acute onset of epigastric pain associated with vomiting.  He reports retching with vomiting.  Sometime during that episode patient apparently attempted to put his fingers in the back of his throat to relieve the sensation of food being lodged thinking that he may have had a fish bone that was stuck in his throat.  This culminated in vomiting with small amount of blood.  He subsequently had relief from vomiting but he continued to have difficulty swallowing and hoarseness of voice.  Imaging of the neck showed posterior pharyngeal edema without evidence of any foreign object or fishbone.  The pain subsequently improved gradually.    He underwent a colonoscopy that showed tubular adenomas.  An EGD that revealed diffusely infiltrated wall of the stomach with superficial ulcerations.  Biopsies confirmed  this to be poorly differentiated adenocarcinoma with signet cell features.    PET scan was done 10/24/2017 and showed IMPRESSION: 1. Small focus of hypermetabolism along the lesser curvature of the antrum of the stomach, corresponding to recently diagnosed gastric neoplasm. No associated lymphadenopathy or definite findings to suggest metastatic disease to the neck, chest, abdomen or pelvis. 2. Aortic atherosclerosis, in addition to left main and 3 vessel coronary artery disease. Please note that although the presence of coronary artery calcium documents the presence of coronary artery disease, the severity of this disease and any potential stenosis cannot be assessed on this non-gated CT examination. Assessment for potential risk factor modification, dietary therapy or pharmacologic therapy may be warranted, if clinically indicated. 3. In addition, there is fusiform aneurysmal dilatation of the infrarenal abdominal aorta which measures up to 3.9 x 3.7 cm. Recommend followup by ultrasound in 2 years. This recommendation follows ACR consensus guidelines: White Paper of the ACR Incidental Findings Committee II on Vascular Findings. J Am Coll Radiol 2013; 10:789-794.  He Is followed by Dr. Barry Dienes of surgery.  He initially indicated he did not desire surgery or chemotherapy.  Patient remains asymptomatic other than mild diarrhea.    Pt has diffuse gastric adenocarcinoma that appears to be localized to the stomach.  Histologically he appears to have a signet ring variety with diffuse involvement.  He is being treated with neoadjuvant chemotherapy followed by surgery followed by adjuvant chemotherapy.  Previously, I have discussed with him that even if he were to be treated with surgery alone based on the diffuse nature of his cancer he was still be recommended for adjuvant therapy.    He is  being treated with FLOT regimen (5-FU dose at 2600 mg/m with 24 hours, oxaliplatin 85 mg/m, leucovorin  200 mg/m, and Taxotere 50 mg/m all on day 1 every 2 weeks for 4 cycles before surgery and 4 cycles following surgery.   PET scan done 10/24/2017 showed no evidence of distant disease.  He will be set up for repeat imaging after C4 completed.  EUS that was done 11/24/2017 showed:  Normal esophagus, ulcerated mass involving the lesser curvature of the stomach approximately mid body.  Mass measured 5 cm across 5 cm in length.  Mass appeared to be 7- 8 cm from the pylorus.  It was also 5 to 6 cm from the GE junction.  It appeared to pass into and through the muscularis propria as well as the serosa making it a T4 a lesion.  Mass was 1.6 cm in maximal thickness.  No adenopathy was noted.  He was staged as a T4 a N0 by EUS.    He has undergone genetic testing that was negative.     He is here for evaluation prior to C3 of FLOT.  Labs reviewed with pt and show WBC 5.8, HB 11.7 and plts 145,000.  Chemistries WNL, Cr 9, HB 12.3 Plts 170, Chemistries WNL, Cr 1.    He will RTC in 2 weeks for C4 of FLOT.  Labs adequate for chemotherapy.    He will be set up for PET scan March 20, 2018 for restaging evaluation after completion of C4 of chemotherapy.  Pending PET results he will be referred to Dr. Barry Dienes for surgical evaluation.  He is again instructed to use decadron first 3 days of each cycle while on Taxotere.    2.  Mouth discomfort.  Pt denies any SOB or wheezing. Pt was drinking a drink with ice this am.   I have discussed with him again to avoid cold drinks and to only use room temperature drinks when he is on chemotherapy.  Will determine if symptoms improve with this recommendation.  No evidence of mucositis on exam.    3.  Diarrhea.  Pt is on Amitiza for IBS and constipation.  I have asked for him to hold that medication while on chemotherapy and to use imodium as directed.  Will determine if symptoms improve.    4.  Hypertension.  BP 121/83.    Follow-up with PCP.  5.  Smoking.  Cessation is recommended.   PET scan showed no clear lung abnormalities.  6.  Abdominal aortic aneurysm.  This was noted on recent PET scan.  Patient was also noted to have atherosclerosis on recent PET scan.  He has been seen by vascular surgery and cardiology in the past and was on aspirin and statins.   Current Status: He is seen today for follow-up prior to C3 of Flot.  Marland Kitchen  He reports mouth discomfort.  He is drinking a drink with ICE in it on today's evaluation.      Malignant neoplasm of stomach ([place])   11/11/2017 Initial Diagnosis    Malignant neoplasm of stomach ([place])      12/16/2017 Genetic Testing    Negative genetic testing on the common hereditary cancer panel.  The Hereditary Gene Panel offered by [facility] includes sequencing and/or deletion duplication testing of the following 47 genes: APC, ATM, AXIN2, BARD1, BMPR1A, BRCA1, BRCA2, BRIP1, CDH1, CDK4, CDKN2A (p14ARF), CDKN2A (p16INK4a), CHEK2, CTNNA1, DICER1, EPCAM (Deletion/duplication testing only), GREM1 (promoter region deletion/duplication testing only), KIT, MEN1, MLH1, MSH2, MSH3, MSH6,  MUTYH, NBN, NF1, NHTL1, PALB2, PDGFRA, PMS2, POLD1, POLE, PTEN, RAD50, RAD51C, RAD51D, SDHB, SDHC, SDHD, SMAD4, SMARCA4. STK11, TP53, TSC1, TSC2, and VHL.  The following genes were evaluated for sequence changes only: SDHA and HOXB13 c.251G>A variant only. The report date is December 16, 2017.        Gastric carcinoma ([place])   01/23/2018 Initial Diagnosis    Gastric carcinoma ([place])      01/26/2018 -  Chemotherapy    The patient had palonosetron (ALOXI) injection 0.25 mg, 0.25 mg, Intravenous,  Once, 3 of 4 cycles Administration: 0.25 mg (01/31/2018), 0.25 mg (02/14/2018) pegfilgrastim (NEULASTA ONPRO KIT) injection 6 mg, 6 mg, Subcutaneous, Once, 3 of 4 cycles Administration: 6 mg (02/01/2018) leucovorin 400 mg in dextrose 5 % 250 mL infusion, 428 mg, Intravenous,  Once, 3 of 4 cycles Administration: 400 mg (01/31/2018), 400 mg (02/14/2018) oxaliplatin (ELOXATIN) 180 mg in  dextrose 5 % 500 mL chemo infusion, 85 mg/m2 = 180 mg, Intravenous,  Once, 3 of 4 cycles Administration: 180 mg (01/31/2018), 180 mg (02/14/2018) DOCEtaxel (TAXOTERE) 110 mg in sodium chloride 0.9 % 250 mL chemo infusion, 50 mg/m2 = 110 mg, Intravenous,  Once, 3 of 4 cycles Administration: 110 mg (01/31/2018), 110 mg (02/14/2018) fluorouracil (ADRUCIL) 5,550 mg in sodium chloride 0.9 % 139 mL chemo infusion, 2,600 mg/m2 = 5,550 mg, Intravenous, 1 Day/Dose, 3 of 4 cycles Administration: 5,550 mg (01/31/2018), 5,550 mg (02/14/2018)  for chemotherapy treatment.         Problem List Patient Active Problem List   Diagnosis Date Noted  . Gastric carcinoma ([place]) [C16.9] 01/23/2018  . Genetic testing [Z13.79] 12/20/2017  . Family history of cancer [Z80.9]   . IBS (irritable bowel syndrome) [K58.9] 11/28/2017  . Malignant neoplasm of stomach ([place]) [C16.9] 11/11/2017  . Constipation [K59.00] 08/30/2017  . GERD (gastroesophageal reflux disease) [K21.9] 08/30/2017  . Globus sensation [F45.8] 08/30/2017  . PAD (peripheral artery disease) ([place]) [I73.9] 08/09/2014  . CAD (coronary artery disease) [I25.10] 07/22/2014  . Tobacco abuse [Z72.0] 07/22/2014  . 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 Hypertension [I10] 07/19/2012  . Hyperlipidemia [E78.5] 07/19/2012  . PVD (peripheral vascular disease) with claudication ([place]) [I73.9] 06/29/2012  . Atherosclerosis of native arteries of the extremities with intermittent claudication [I70.219] 04/20/2012    Past Medical History Past Medical History:  Diagnosis Date  . CAD (coronary artery disease)    a. Cath 07/2012 following abnormal nuclear stress test: 20-30% LAD, 20-30% OM1, occluded branch off OM2 that fills slowly via collaterals, small nondominant RCA occluded proximally, fills slowly via collaterals.  . CKD (chronic kidney disease), stage II   . Family history of cancer   . Gastric adenocarcinoma ([place])   . History of blood  transfusion    "when I was a child"  . Hyperlipidemia   . Hypertension   . Peripheral vascular disease ([place])    a. bilateral superficial femoral artery stents in May/August of 2012. b. Left femoral to above-knee popliteal bypass in 08/2012.  Marland Kitchen Renal insufficiency   . Stroke [facility])    2008 right side weakness  . Tobacco abuse     Past Surgical History Past Surgical History:  Procedure Laterality Date  . ABDOMINAL AORTAGRAM N/A 04/27/2012   Procedure: ABDOMINAL Maxcine Ham;  Surgeon: Conrad [facility]'s, MD;  Location: [facility] CATH LAB;  Service: Cardiovascular;  Laterality: N/A;  . ABDOMINAL AORTAGRAM N/A 08/09/2014   Procedure: ABDOMINAL Maxcine Ham;  Surgeon: Elam Dutch, MD;  Location: [facility] CATH  LAB;  Service: Cardiovascular;  Laterality: N/A;  . BIOPSY  10/06/2017   Procedure: BIOPSY;  Surgeon: Daneil Dolin, MD;  Location[facility] ENDO SUITE;  Service: Endoscopy;;  gastric   . CARDIAC CATHETERIZATION    . COLONOSCOPY    . COLONOSCOPY WITH PROPOFOL N/A 10/06/2017   Procedure: COLONOSCOPY WITH PROPOFOL;  Surgeon: Daneil Dolin, MD;  Location: [facility] ENDO SUITE;  Service: Endoscopy;  Laterality: N/A;  1:45pm - patient can't come earlier due to transportation  . ESOPHAGOGASTRODUODENOSCOPY (EGD) WITH PROPOFOL N/A 10/06/2017   Procedure: ESOPHAGOGASTRODUODENOSCOPY (EGD) WITH PROPOFOL;  Surgeon: Daneil Dolin, MD;  [facility] END[facility];  Service: Endoscopy;  Laterality: N/A;  . EUS N/A 11/24/2017   Procedure: UPPER ENDOSCOPIC ULTRASOUND (EUS) RADIAL;  Surgeon: Milus Banister, MD;  Location: [facility] ENDOSCOPY;  Service: Endoscopy;  Laterality: N/A;  . FEMORAL ARTERY - POPLITEAL ARTERY BYPASS GRAFT    . FEMORAL-POPLITEAL BYPASS GRAFT  09/26/2012   Procedure: BYPASS GRAFT FEMORAL-POPLITEAL ARTERY;  Surgeon: Elam Dutch, MD;  [facility] OR;  Service: Vascular;  Laterality: Left;  using 27m Gore-Tex Propaten Ringed Graft, Vein patch on the proximal and distal anastomosis  . FEMORAL-POPLITEAL BYPASS GRAFT  Right 08/12/2014   Procedure:  FEMORAL-POPLITEAL ARTERY BYPASS WITH SAPHENOUS VEIN GRAFT, INTRAOPERATIVE ARTERIOGRAM;  Surgeon: CElam Dutch MD;  [place]  Service: Vascular;  Laterality: Right;  . INTRAOPERATIVE ARTERIOGRAM  08/12/2014   Procedure: INTRA OPERATIVE ARTERIOGRAM;  Surgeon: CElam Dutch MD;  [place]  Service: Vascular;;  . POLYPECTOMY  10/06/2017   Procedure: POLYPECTOMY;  Surgeon: RDaneil Dolin MD;  [facility];  Service: Endoscopy;;  splenic flexure,  ascending  . PORTACATH PLACEMENT N/A 01/24/2018   Procedure: INSERTION PORT-A-CATH;  Surgeon: BStark Klein MD;  [place]  Service: General;  Laterality: N/A;  . PR VEIN BYPASS GRAFT,AORTO-FEM-POP    . stents in bil legs      Family History Family History  Problem Relation Age of Onset  . Diabetes Mother   . Heart disease Mother        before age 55 Triple BPG  . Cancer Mother        Luekemia  . Deep vein thrombosis Mother   . Hyperlipidemia Mother   . Hypertension Mother   . Heart attack Mother   . Peripheral vascular disease Mother   . Stroke Mother        x 3  . Pancreatic cancer Mother   . Arthritis Father   . Hypertension Father   . Kidney disease Father   . Cancer Maternal Grandmother        NOS  . Diabetes Cousin   . Cancer Cousin        NOS, pat first cousin  . Cancer Cousin        NOS, pat first cousin  . Colon cancer Neg Hx   . Gastric cancer Neg Hx   . Esophageal cancer Neg Hx      Social History  reports that he has been smoking cigars.  He started smoking about 43 years ago. He has a 25.00 pack-year smoking history. He has never used smokeless tobacco. He reports that he has current or past drug history. Drug: Marijuana. He reports that he does not drink alcohol.  Medications  Current Outpatient Medications:  .  amLODipine (NORVASC) 10 MG tablet, TAKE ONE TABLET BY MOUTH ONCE DAILY, Disp: 90 tablet, Rfl: 3 .  aspirin EC 81  MG tablet, Take 81  mg by mouth daily., Disp: , Rfl:  .  dexamethasone (DECADRON) 4 MG tablet, Take 2 tablets (8 mg total) by mouth daily. Start the day after chemotherapy for 2 days. Take with food., Disp: 30 tablet, Rfl: 1 .  DOCEtaxel (TAXOTERE IV), Inject into the vein., Disp: , Rfl:  .  fluorouracil CALGB 49826 in sodium chloride 0.9 % 150 mL, Inject into the vein over 24 hr., Disp: , Rfl:  .  LEUCOVORIN CALCIUM IV, Inject into the vein., Disp: , Rfl:  .  lidocaine (XYLOCAINE) 2 % solution, , Disp: , Rfl:  .  lidocaine-prilocaine (EMLA) cream, Apply to affected area once, Disp: 30 g, Rfl: 3 .  lisinopril (PRINIVIL,ZESTRIL) 40 MG tablet, Take 40 mg by mouth at bedtime., Disp: , Rfl:  .  lubiprostone (AMITIZA) 8 MCG capsule, Take 1 capsule (8 mcg total) by mouth 2 (two) times daily with a meal., Disp: 60 capsule, Rfl: 11 .  magic mouthwash w/lidocaine SOLN, Take 10 mLs by mouth 3 (three) times daily as needed for mouth pain. Swish and spit magic mouth wash as directed by oncologist., Disp: 360 mL, Rfl: 1 .  ondansetron (ZOFRAN) 8 MG tablet, Take 1 tablet (8 mg total) by mouth 2 (two) times daily as needed for refractory nausea / vomiting. Start on day 3 after chemotherapy., Disp: 30 tablet, Rfl: 1 .  OXALIPLATIN IV, Inject into the vein., Disp: , Rfl:  .  oxyCODONE (OXY IR/ROXICODONE) 5 MG immediate release tablet, Take 1 tablet (5 mg total) by mouth every 6 (six) hours as needed for severe pain., Disp: 20 tablet, Rfl: 0 .  pantoprazole (PROTONIX) 40 MG tablet, TAKE 1 TABLET BY MOUTH TWICE DAILY BEFORE A MEAL, Disp: 60 tablet, Rfl: 3 .  prochlorperazine (COMPAZINE) 10 MG tablet, Take 1 tablet (10 mg total) by mouth every 6 (six) hours as needed (Nausea or vomiting)., Disp: 30 tablet, Rfl: 1 .  rosuvastatin (CRESTOR) 10 MG tablet, TAKE ONE TABLET BY MOUTH ONCE DAILY (Patient taking differently: TAKE 10 MG BY MOUTH ONCE DAILY AT BEDTIME), Disp: 90 tablet, Rfl: 3 .  valACYclovir (VALTREX) 500 MG tablet, Take 500 mg by  mouth at bedtime., Disp: , Rfl:  .  vitamin B-12 (CYANOCOBALAMIN) 1000 MCG tablet, Take 1,000 mcg by mouth daily., Disp: , Rfl:  .  loperamide (IMODIUM) 2 MG capsule, Take 1 capsule (2 mg total) by mouth as needed for diarrhea or loose stools., Disp: 30 capsule, Rfl: 0 No current facility-administered medications for this visit.   Facility-Administered Medications Ordered in Other Visits:  .  DOCEtaxel (TAXOTERE) 110 mg in sodium chloride 0.9 % 250 mL chemo infusion, 50 mg/m2 (Treatment Plan Recorded), Intravenous, Once, Master Touchet, Mathis Dad, MD .  fluorouracil (ADRUCIL) 5,550 mg in sodium chloride 0.9 % 139 mL chemo infusion, 2,600 mg/m2 (Treatment Plan Recorded), Intravenous, 1 day or 1 dose, Jordain Radin, MD .  leucovorin 400 mg in dextrose 5 % 250 mL infusion, 400 mg, Intravenous, Once, Jarious Lyon, MD .  oxaliplatin (ELOXATIN) 180 mg in dextrose 5 % 500 mL chemo infusion, 85 mg/m2 (Treatment Plan Recorded), Intravenous, Once, Lacosta Hargan, MD .  sodium chloride flush (NS) 0.9 % injection 10 mL, 10 mL, Intracatheter, PRN, Milam Allbaugh, MD, 10 mL at 02/28/18 0955  Allergies Potassium-containing compounds  Review of Systems Review of Systems - Oncology ROS as per HPI otherwise 12 point ROS is negative.   Physical Exam  Vitals Wt Readings from Last 3 Encounters:  02/28/18 184 lb 9.6 oz (83.7 kg)  02/14/18 190 lb 12.8 oz (86.5 kg)  02/08/18 191 lb (86.6 kg)   Temp Readings from Last 3 Encounters:  02/28/18 98.7 F (37.1 C) (Oral)  02/14/18 97.9 F (36.6 C) (Oral)  02/08/18 98.3 F (36.8 C) (Oral)   BP Readings from Last 3 Encounters:  02/28/18 121/83  02/15/18 140/77  02/14/18 137/85   Pulse Readings from Last 3 Encounters:  02/28/18 80  02/15/18 70  02/14/18 73   Constitutional: Well-developed, well-nourished, and in no distress.   HENT: Head: Normocephalic and atraumatic.  Mouth/Throat: No oropharyngeal exudate. Mucosa moist.  No mucositis noted Eyes: Pupils are equal,  round, and reactive to light. Conjunctivae are normal. No scleral icterus.  Neck: Normal range of motion. Neck supple. No JVD present.  Cardiovascular: Normal rate, regular rhythm and normal heart sounds.  Exam reveals no gallop and no friction rub.   No murmur heard. Pulmonary/Chest: Effort normal and breath sounds normal. No respiratory distress. No wheezes.No rales.  Abdominal: Soft. Bowel sounds are normal. No distension. There is no tenderness. There is no guarding.  Musculoskeletal: No edema or tenderness.  Lymphadenopathy: No cervical, axillary or supraclavicular adenopathy.  Neurological: Alert and oriented to person, place, and time. No cranial nerve deficit.  Skin: Skin is warm and dry. No rash noted. No erythema. No pallor.  Psychiatric: Affect and judgment normal.   Labs Appointment on 02/28/2018  Component Date Value Ref Range Status  . WBC 02/28/2018 5.8  4.0 - 10.5 K/uL Final  . RBC 02/28/2018 4.05* 4.22 - 5.81 MIL/uL Final  . Hemoglobin 02/28/2018 11.7* 13.0 - 17.0 g/dL Final  . HCT 02/28/2018 35.5* 39.0 - 52.0 % Final  . MCV 02/28/2018 87.7  78.0 - 100.0 fL Final  . MCH 02/28/2018 28.9  26.0 - 34.0 pg Final  . MCHC 02/28/2018 33.0  30.0 - 36.0 g/dL Final  . RDW 02/28/2018 15.0  11.5 - 15.5 % Final  . Platelets 02/28/2018 145* 150 - 400 K/uL Final  . Neutrophils Relative % 02/28/2018 38  % Final  . Neutro Abs 02/28/2018 2.2  1.7 - 7.7 K/uL Final  . Lymphocytes Relative 02/28/2018 44  % Final  . Lymphs Abs 02/28/2018 2.6  0.7 - 4.0 K/uL Final  . Monocytes Relative 02/28/2018 16  % Final  . Monocytes Absolute 02/28/2018 0.9  0.1 - 1.0 K/uL Final  . Eosinophils Relative 02/28/2018 2  % Final  . Eosinophils Absolute 02/28/2018 0.1  0.0 - 0.7 K/uL Final  . Basophils Relative 02/28/2018 0  % Final  . Basophils Absolute 02/28/2018 0.0  0.0 - 0.1 K/uL Final   Performed at [facility], [address], [place], [place] [address]  . Sodium 02/28/2018 138  135 - 145 mmol/L  Final  . Potassium 02/28/2018 3.6  3.5 - 5.1 mmol/L Final  . Chloride 02/28/2018 104  101 - 111 mmol/L Final  . CO2 02/28/2018 26  22 - 32 mmol/L Final  . Glucose, Bld 02/28/2018 91  65 - 99 mg/dL Final  . BUN 02/28/2018 8  6 - 20 mg/dL Final  . Creatinine, Ser 02/28/2018 1.03  0.61 - 1.24 mg/dL Final  . Calcium 02/28/2018 9.3  8.9 - 10.3 mg/dL Final  . Total Protein 02/28/2018 6.9  6.5 - 8.1 g/dL Final  . Albumin 02/28/2018 3.7  3.5 - 5.0 g/dL Final  . AST 02/28/2018 21  15 - 41 U/L Final  . ALT 02/28/2018 22  17 -  63 U/L Final  . Alkaline Phosphatase 02/28/2018 65  38 - 126 U/L Final  . Total Bilirubin 02/28/2018 0.5  0.3 - 1.2 mg/dL Final  . GFR calc non Af Amer 02/28/2018 >60  >60 mL/min Final  . GFR calc Af Amer 02/28/2018 >60  >60 mL/min Final   Comment: (NOTE) The eGFR has been calculated using the CKD EPI equation. This calculation has not been validated in all clinical situations. eGFR's persistently <60 mL/min signify possible Chronic Kidney Disease.   Georgiann Hahn gap 02/28/2018 8  5 - 15 Final   Performed at [facility], [address] [place], [place] [address]  . LDH 02/28/2018 201* 98 - 192 U/L Final   Performed at [facility], [address], [place], [place] [address]     Pathology Orders Placed This Encounter  Procedures  . NM PET Image Restag (PS) Skull Base To Thigh    Standing Status:   Future    Standing Expiration Date:   02/28/2019    Order Specific Question:   If indicated for the ordered procedure, I authorize the administration of a radiopharmaceutical per Radiology protocol    Answer:   Yes    Order Specific Question:   Preferred imaging location?    Answer:   [facility]    Order Specific Question:   Radiology Contrast Protocol - do NOT remove file path    Answer:   [email or web address]  . CBC with Differential/Platelet    Standing Status:   Future    Standing Expiration Date:   03/01/2019  . Comprehensive metabolic panel     Standing Status:   Future    Standing Expiration Date:   03/01/2019  . Lactate dehydrogenase    Standing Status:   Future    Standing Expiration Date:   03/01/2019       Zoila Shutter MD

## 2018-02-28 NOTE — Progress Notes (Signed)
Follow Up Nutition Assessment  ASSESSMENT: 57 y/o male PMHx CAD, CKD2, PVD, CVA, Tobacco abuse, HTN/HLD. Developed acute epigastric pain w/ n/v and globus sensation Dec of 2018. 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 PET confirmed hypermetabolic, ultimately staged [id number]. Undergoing neoadjuvant chemo w/ tentative plans to undergo gastrectomy (partial vs total).   Patient has lost 6 lbs in the past 2 weeks.   Today, he notes that, despite RDs education 2 weeks ago, he is still  not eating at all in the mornings; he only will eat in the evenings.   He also newly reveals that a large reason he doesn't eat in the morning is because "I dont know how to cook"; he goes to fast food restaurants for the majority of his meals.   He also reports the onset of further side effects, namely further loss of appetite and taste changes, "stuff tastes funny". He says it is hard to explain exactly what food taste likes, he says french fries have a "burnt" taste. He says he has tried adding salt and sugar to his foods, but he "cant taste that either".   He consumed all of the last case of Ensure. He says he drank 4 of them a day until he ran out. He has not bought more due to cost.   Again, only able to obtain limited information, as he is a poor historian  Wt Readings from Last 10 Encounters:  02/28/18 184 lb 9.6 oz (83.7 kg)  02/14/18 190 lb 12.8 oz (86.5 kg)  02/08/18 191 lb (86.6 kg)  02/07/18 191 lb (86.6 kg)  01/31/18 197 lb 14.4 oz (89.8 kg)  01/24/18 191 lb (86.6 kg)  01/17/18 195 lb 14.4 oz (88.9 kg)  12/15/17 197 lb 11.2 oz (89.7 kg)  11/28/17 203 lb (92.1 kg)  11/24/17 194 lb (88 kg)   MEDICATIONS:  Chemo: 5FU, Taxotere, oxaliplatin, leucovorin, fluorouracil Decadron (day after chemo x2 days) Supportive meds: compazine, ppi, zofran, magic mouthwash, oxycodone, b12  LABS:  CMP: WDL. WBC 5.8  (down from 37.1 2wks ago).   LDH 155 4 wks ago to 201 today  Recent Labs  Lab 02/28/18 0801  NA 138  K 3.6  CL 104  CO2 26  BUN 8  CREATININE 1.03  CALCIUM 9.3  GLUCOSE 91   ANTHROPOMETRICS: Height: 6'1" (185.42 cm) Weight: 184 lbs 10 oz (83.92 kg) BMI: 24.36-healthy wt for ht Wt change: -6 lbs x 2 weeks  ESTIMATED ENERGY NEEDS:  Kcal: 2500-2700 (30-32 kcal/kg bw) Protein: 110-125g Pro (1.3-1.5 g/kg bw) Fluid: 2.5 L (30 ml/kg bw)   NUTRITION DIAGNOSIS:  Inadequate oral intake r/t decreased appetite and lack of knowledge/understanding AEB loss of 13 lbs x 4 weeks.   DOCUMENTATION CODES:  Moderate malnutrition, acute (on chronic. Criteria listed below  INTERVENTION:  Unfortunately, patient has retained little to none of the information he was educated on during RDs first encounter. RD reiterated need to eat frequently during the day. RD explained that if he was drinking 4 ensures (1400 calories) AND eating 2 fast food meals/day, but still losing weight, he has a substantially heightened energy expenditure. The only way he will meet this is by eating frequently. He states "so that is why I have been losing weight?".   RD discussed how an altered sense of taste is quite par-for-the-course. RD explained it is a combination of the cancer and chemotherapy, causing death of quickly  dividing cells, such as those in his mouth. He is encouraged to add seasonings, sugar, salt, mayo, ketchup, mustard etc to meals. This may not only help with taste loss, but could also provide extra kcals. He says he has a mouthwash, but has not used it. RD gave recipe for baking soda/salt mouth rinse, which can help remove debris from mouth and "freshen" sense of taste. Also stressed mouth care; he says he does brush his tongue often.   He noted he does not know how to cook. RD gave him list of easy-to-prepare items, that he can consume at home. RD hand wrote some of the better choices.   We reviewed  handouts titled "Making the Most of each Bite", "Soft and Moist High Protein Menu Ideas" and "Taste and Smell Changes".   RD noted relevant foods that were high in calories/protein and the instruction of eating every few hrs.   He is presented for his second case of Ensure today. He is eligible for 1 more.   He is provided with coupons for supplements.  Patient continues to meet criteria for moderate malnutrition in acute on chronic context. Criteria met are mild-moderate fat/muscle wasting and a wt loss of 8 lbs in 2 weeks.   RD will continue to follow.   GOAL:  Oral intake to meet >90% of needs, wt stability, increased knowledge of appropriate eating habits  NOT MET  MONITOR:  Weight, tolerance of diet/chemo, evidence of education comprehension  Next Visit: 6/18 in chemo  [name] RD, LDN, CNSC Clinical Nutrition Available Tues-Sat via Pager: [phone] 02/28/2018 9:00 AM

## 2018-02-28 NOTE — Progress Notes (Signed)
Patient seen by Dr. Walden Field today.  Patient stated he still continues to have what "feels" like tongue swelling.  Benadryl 25mg  IV verbal order Dr. Walden Field.  Prn diarrhea and constipation.  Denied neuropathy.  No s/s of distress noted.  Patient reminded of cold sensitivities with chemotherapy.   Patient tolerated chemotherapy with no complaints voiced.  Port site clean and dry with no bruising or swelling noted at site.  Chemotherapy pump 5FU started with no alarms noted.  Dressing intact.  VSs with discharge and left ambulatory with no s/s of distress noted.

## 2018-02-28 NOTE — Patient Instructions (Addendum)
  [place] at [facility]  Discharge Instructions:  You received taxotere, oxaliplatin, leucovorin, and adruicil.  _______________________________________________________________  Thank you for choosing [place] at [facility] to provide your oncology and hematology care.  To afford each patient quality time with our providers, please arrive at least 15 minutes before your scheduled appointment.  You need to re-schedule your appointment if you arrive 10 or more minutes late.  We strive to give you quality time with our providers, and arriving late affects you and other patients whose appointments are after yours.  Also, if you no show three or more times for appointments you may be dismissed from the clinic.  Again, thank you for choosing [place] at [facility] hope is that these requests will allow you access to exceptional care and in a timely manner. _______________________________________________________________  If you have questions after your visit, please contact our office at [phone] between the hours of 8:30 a.m. and 5:00 p.m. Voicemails left after 4:30 p.m. will not be returned until the following business day. _______________________________________________________________  For prescription refill requests, have your pharmacy contact our office. _______________________________________________________________  Recommendations made by the consultant and any test results will be sent to your referring physician. _______________________________________________________________  [facility] Discharge Instructions for Patients Receiving Chemotherapy   If you develop nausea and vomiting that is not controlled by your nausea medication, call the clinic.   BELOW ARE SYMPTOMS THAT SHOULD BE REPORTED IMMEDIATELY:  *FEVER GREATER THAN 100.5 F  *CHILLS WITH OR WITHOUT FEVER  NAUSEA AND  VOMITING THAT IS NOT CONTROLLED WITH YOUR NAUSEA MEDICATION  *UNUSUAL SHORTNESS OF BREATH  *UNUSUAL BRUISING OR BLEEDING  TENDERNESS IN MOUTH AND THROAT WITH OR WITHOUT PRESENCE OF ULCERS  *URINARY PROBLEMS  *BOWEL PROBLEMS  UNUSUAL RASH Items with * indicate a potential emergency and should be followed up as soon as possible.  Feel free to call the clinic should you have any questions or concerns. The clinic phone number is [phone].  Please show the [place] at check-in to the Emergency Department and triage nurse.

## 2018-03-01 ENCOUNTER — Encounter: Payer: Self-pay | Admitting: *Deleted

## 2018-03-01 ENCOUNTER — Inpatient Hospital Stay (HOSPITAL_COMMUNITY): Payer: Medicare Other

## 2018-03-01 ENCOUNTER — Encounter: Payer: Self-pay | Admitting: Cardiology

## 2018-03-01 ENCOUNTER — Ambulatory Visit (INDEPENDENT_AMBULATORY_CARE_PROVIDER_SITE_OTHER): Payer: Medicare Other | Admitting: Cardiology

## 2018-03-01 VITALS — BP 112/73 | HR 82 | Ht 73.0 in | Wt 187.8 lb

## 2018-03-01 VITALS — BP 114/65 | HR 80 | Temp 97.6°F | Resp 18

## 2018-03-01 DIAGNOSIS — I10 Essential (primary) hypertension: Secondary | ICD-10-CM

## 2018-03-01 DIAGNOSIS — I739 Peripheral vascular disease, unspecified: Secondary | ICD-10-CM | POA: Diagnosis not present

## 2018-03-01 DIAGNOSIS — K581 Irritable bowel syndrome with constipation: Secondary | ICD-10-CM | POA: Diagnosis not present

## 2018-03-01 DIAGNOSIS — Z5111 Encounter for antineoplastic chemotherapy: Secondary | ICD-10-CM | POA: Diagnosis not present

## 2018-03-01 DIAGNOSIS — K1379 Other lesions of oral mucosa: Secondary | ICD-10-CM | POA: Diagnosis not present

## 2018-03-01 DIAGNOSIS — E782 Mixed hyperlipidemia: Secondary | ICD-10-CM

## 2018-03-01 DIAGNOSIS — C165 Malignant neoplasm of lesser curvature of stomach, unspecified: Secondary | ICD-10-CM | POA: Diagnosis not present

## 2018-03-01 DIAGNOSIS — I714 Abdominal aortic aneurysm, without rupture: Secondary | ICD-10-CM | POA: Diagnosis not present

## 2018-03-01 DIAGNOSIS — I2510 Atherosclerotic heart disease of native coronary artery without angina pectoris: Secondary | ICD-10-CM | POA: Diagnosis not present

## 2018-03-01 DIAGNOSIS — C169 Malignant neoplasm of stomach, unspecified: Secondary | ICD-10-CM

## 2018-03-01 MED ORDER — SODIUM CHLORIDE 0.9% FLUSH
10.0000 mL | INTRAVENOUS | Status: DC | PRN
  Administered 2018-03-01: 10 mL
  Filled 2018-03-01: qty 10

## 2018-03-01 MED ORDER — ROSUVASTATIN CALCIUM 10 MG PO TABS: 10.0000 mg | ORAL_TABLET | Freq: Every day | ORAL | 3 refills | Status: DC

## 2018-03-01 MED ORDER — HEPARIN SOD (PORK) LOCK FLUSH 100 UNIT/ML IV SOLN
500.0000 [IU] | Freq: Once | INTRAVENOUS | Status: AC | PRN
  Administered 2018-03-01: 500 [IU]

## 2018-03-01 MED ORDER — HEPARIN SOD (PORK) LOCK FLUSH 100 UNIT/ML IV SOLN
INTRAVENOUS | Status: AC
  Filled 2018-03-01: qty 5

## 2018-03-01 MED ORDER — PEGFILGRASTIM 6 MG/0.6ML ~~LOC~~ PSKT
6.0000 mg | PREFILLED_SYRINGE | Freq: Once | SUBCUTANEOUS | Status: AC
  Administered 2018-03-01: 6 mg via SUBCUTANEOUS

## 2018-03-01 MED ORDER — PEGFILGRASTIM 6 MG/0.6ML ~~LOC~~ PSKT
PREFILLED_SYRINGE | SUBCUTANEOUS | Status: AC
  Filled 2018-03-01: qty 0.6

## 2018-03-01 NOTE — Progress Notes (Signed)
Clinical Summary Mr. Marengo is a 57 y.o.male seen today for follow up of the following medical problems.   1. HTN -home bps 120s-130s/80-90s - remains compliant with bp meds  - compliant with meds.    2. HL - compliant with statin - 04/2016 TC 140 HDL 33 TGs 151 LDL 77  - upcoming labs with pcp. Ran out of crestor.   3. PAD - hx of bilateral SFA stents in 2012, left femoral to above knee popliteteal bypass 2013 and right femoral to below knee bypass 07/2014  - no recent symptoms.   4. CAD - cath 2013 with patent vessels other than small OM branch that was occluded and an occluded small non-dominant RCA. LVEF by LVgram 65%.  - Patient has small area of ischemia on MPI 07/2014 related to this disease that has been managed medically.    - no recent chest pain. No SOB/DOE   5. CKD stage II   6. Gastric cacner - currently on chemotherapy    SH: retired Camera operator    Past Medical History:  Diagnosis Date  . CAD (coronary artery disease)    a. Cath 07/2012 following abnormal nuclear stress test: 20-30% LAD, 20-30% OM1, occluded branch off OM2 that fills slowly via collaterals, small nondominant RCA occluded proximally, fills slowly via collaterals.  . CKD (chronic kidney disease), stage II   . Family history of cancer   . Gastric adenocarcinoma ([place])   . History of blood transfusion    "when I was a child"  . Hyperlipidemia   . Hypertension   . Peripheral vascular disease ([place])    a. bilateral superficial femoral artery stents in May/August of 2012. b. Left femoral to above-knee popliteal bypass in 08/2012.  Marland Kitchen Renal insufficiency   . Stroke [facility])    2008 right side weakness  . Tobacco abuse      Allergies  Allergen Reactions  . Potassium-Containing Compounds Swelling    Unspecified cause of Swelling of lip after taking potassium pill     Current Outpatient Medications  Medication Sig Dispense Refill  . amLODipine (NORVASC) 10 MG  tablet TAKE ONE TABLET BY MOUTH ONCE DAILY 90 tablet 3  . aspirin EC 81 MG tablet Take 81 mg by mouth daily.    Marland Kitchen dexamethasone (DECADRON) 4 MG tablet Take 2 tablets (8 mg total) by mouth daily. Start the day after chemotherapy for 2 days. Take with food. 30 tablet 1  . DOCEtaxel (TAXOTERE IV) Inject into the vein.    . fluorouracil CALGB 43154 in sodium chloride 0.9 % 150 mL Inject into the vein over 24 hr.    . LEUCOVORIN CALCIUM IV Inject into the vein.    Marland Kitchen lidocaine (XYLOCAINE) 2 % solution     . lidocaine-prilocaine (EMLA) cream Apply to affected area once 30 g 3  . lisinopril (PRINIVIL,ZESTRIL) 40 MG tablet Take 40 mg by mouth at bedtime.    Marland Kitchen loperamide (IMODIUM) 2 MG capsule Take 1 capsule (2 mg total) by mouth as needed for diarrhea or loose stools. 30 capsule 0  . lubiprostone (AMITIZA) 8 MCG capsule Take 1 capsule (8 mcg total) by mouth 2 (two) times daily with a meal. 60 capsule 11  . magic mouthwash w/lidocaine SOLN Take 10 mLs by mouth 3 (three) times daily as needed for mouth pain. Swish and spit magic mouth wash as directed by oncologist. 360 mL 1  . ondansetron (ZOFRAN) 8 MG tablet Take 1 tablet (8 mg  total) by mouth 2 (two) times daily as needed for refractory nausea / vomiting. Start on day 3 after chemotherapy. 30 tablet 1  . OXALIPLATIN IV Inject into the vein.    Marland Kitchen oxyCODONE (OXY IR/ROXICODONE) 5 MG immediate release tablet Take 1 tablet (5 mg total) by mouth every 6 (six) hours as needed for severe pain. 20 tablet 0  . pantoprazole (PROTONIX) 40 MG tablet TAKE 1 TABLET BY MOUTH TWICE DAILY BEFORE A MEAL 60 tablet 3  . prochlorperazine (COMPAZINE) 10 MG tablet Take 1 tablet (10 mg total) by mouth every 6 (six) hours as needed (Nausea or vomiting). 30 tablet 1  . rosuvastatin (CRESTOR) 10 MG tablet TAKE ONE TABLET BY MOUTH ONCE DAILY (Patient taking differently: TAKE 10 MG BY MOUTH ONCE DAILY AT BEDTIME) 90 tablet 3  . valACYclovir (VALTREX) 500 MG tablet Take 500 mg by mouth  at bedtime.    . vitamin B-12 (CYANOCOBALAMIN) 1000 MCG tablet Take 1,000 mcg by mouth daily.     No current facility-administered medications for this visit.      Past Surgical History:  Procedure Laterality Date  . ABDOMINAL AORTAGRAM N/A 04/27/2012   Procedure: ABDOMINAL Maxcine Ham;  Surgeon: Conrad Heeia, MD;  Location: [facility] CATH LAB;  Service: Cardiovascular;  Laterality: N/A;  . ABDOMINAL AORTAGRAM N/A 08/09/2014   Procedure: ABDOMINAL Maxcine Ham;  Surgeon: Elam Dutch, MD;  Location: [facility] CATH LAB;  Service: Cardiovascular;  Laterality: N/A;  . BIOPSY  10/06/2017   Procedure: BIOPSY;  Surgeon: Daneil Dolin, MD;  Location: [facility] ENDO SUITE;  Service: Endoscopy;;  gastric   . CARDIAC CATHETERIZATION    . COLONOSCOPY    . COLONOSCOPY WITH PROPOFOL N/A 10/06/2017   Procedure: COLONOSCOPY WITH PROPOFOL;  Surgeon: Daneil Dolin, MD;  Location: [facility] ENDO SUITE;  Service: Endoscopy;  Laterality: N/A;  1:45pm - patient can't come earlier due to transportation  . ESOPHAGOGASTRODUODENOSCOPY (EGD) WITH PROPOFOL N/A 10/06/2017   Procedure: ESOPHAGOGASTRODUODENOSCOPY (EGD) WITH PROPOFOL;  Surgeon: Daneil Dolin, MD;  [facility] ENDO [facility];  Service: Endoscopy;  Laterality: N/A;  . EUS N/A 11/24/2017   Procedure: UPPER ENDOSCOPIC ULTRASOUND (EUS) RADIAL;  Surgeon: Milus Banister, MD;  Location: [facility] ENDOSCOPY;  Service: Endoscopy;  Laterality: N/A;  . FEMORAL ARTERY - POPLITEAL ARTERY BYPASS GRAFT    . FEMORAL-POPLITEAL BYPASS GRAFT  09/26/2012   Procedure: BYPASS GRAFT FEMORAL-POPLITEAL ARTERY;  Surgeon: Elam Dutch, MD;  Location: [facility] OR;  Service: Vascular;  Laterality: Left;  using 62mm Gore-Tex Propaten Ringed Graft, Vein patch on the proximal and distal anastomosis  . FEMORAL-POPLITEAL BYPASS GRAFT Right 08/12/2014   Procedure:  FEMORAL-POPLITEAL ARTERY BYPASS WITH SAPHENOUS VEIN GRAFT, INTRAOPERATIVE ARTERIOGRAM;  Surgeon: Elam Dutch, MD;  Location: [place];  Service: Vascular;   Laterality: Right;  . INTRAOPERATIVE ARTERIOGRAM  08/12/2014   Procedure: INTRA OPERATIVE ARTERIOGRAM;  Surgeon: Elam Dutch, MD;  Location: [place];  Service: Vascular;;  . POLYPECTOMY  10/06/2017   Procedure: POLYPECTOMY;  Surgeon: Daneil Dolin, MD;  [facility];  Service: Endoscopy;;  splenic flexure,  ascending  . PORTACATH PLACEMENT N/A 01/24/2018   Procedure: INSERTION PORT-A-CATH;  Surgeon: [name], MD;  Location: [place];  Service: General;  Laterality: N/A;  . PR VEIN BYPASS GRAFT,AORTO-FEM-POP    . stents in bil legs       Allergies  Allergen Reactions  . Potassium-Containing Compounds Swelling    Unspecified cause of Swelling of lip after taking potassium  pill      Family History  Problem Relation Age of Onset  . Diabetes Mother   . Heart disease Mother        before age 17- Triple BPG  . Cancer Mother        Luekemia  . Deep vein thrombosis Mother   . Hyperlipidemia Mother   . Hypertension Mother   . Heart attack Mother   . Peripheral vascular disease Mother   . Stroke Mother        x 3  . Pancreatic cancer Mother   . Arthritis Father   . Hypertension Father   . Kidney disease Father   . Cancer Maternal Grandmother        NOS  . Diabetes Cousin   . Cancer Cousin        NOS, pat first cousin  . Cancer Cousin        NOS, pat first cousin  . Colon cancer Neg Hx   . Gastric cancer Neg Hx   . Esophageal cancer Neg Hx      Social History Mr. Franssen reports that he has been smoking cigars.  He started smoking about 43 years ago. He has a 25.00 pack-year smoking history. He has never used smokeless tobacco. Mr. Kucinski reports that he does not drink alcohol.   Review of Systems CONSTITUTIONAL: No weight loss, fever, chills, weakness or fatigue.  HEENT: Eyes: No visual loss, blurred vision, double vision or yellow sclerae.No hearing loss, sneezing, congestion, runny nose or sore throat.  SKIN: No rash or itching.  CARDIOVASCULAR: per  hpi RESPIRATORY: No shortness of breath, cough or sputum.  GASTROINTESTINAL: No anorexia, nausea, vomiting or diarrhea. No abdominal pain or blood.  GENITOURINARY: No burning on urination, no polyuria NEUROLOGICAL: No headache, dizziness, syncope, paralysis, ataxia, numbness or tingling in the extremities. No change in bowel or bladder control.  MUSCULOSKELETAL: No muscle, back pain, joint pain or stiffness.  LYMPHATICS: No enlarged nodes. No history of splenectomy.  PSYCHIATRIC: No history of depression or anxiety.  ENDOCRINOLOGIC: No reports of sweating, cold or heat intolerance. No polyuria or polydipsia.  Marland Kitchen   Physical Examination Vitals:   03/01/18 0808  BP: 112/73  Pulse: 82   Filed Weights   03/01/18 0808  Weight: 187 lb 12.8 oz (85.2 kg)    Gen: resting comfortably, no acute distress HEENT: no scleral icterus, pupils equal round and reactive, no palptable cervical adenopathy,  CV:RRR, no m/r/g, no jvd Resp: Clear to auscultation bilaterally GI: abdomen is soft, non-tender, non-distended, normal bowel sounds, no hepatosplenomegaly MSK: extremities are warm, no edema.  Skin: warm, no rash Neuro:  no focal deficits Psych: appropriate affect   Diagnostic Studies 07/2012 Cath Hemodynamics:   LV pressure:115/23 Aortic pressure:111/69  Angiography   Left Main:LM normal  Left anterior Descending:large, minor luminal irregularities. 20-30% stenosis diffusely. Several small diagonals which have only minor luminal irreg.  Left Circumflex:large and dominant, 20-30%, OM 1 is small and normal, OM2 is a small vessel. There is a small branch off the OM2 that is occluded and fills slowly via collaterals. PDA is unremarkable  Right Coronary Artery:small , nondominant, occluded Proximally, fills slowly via collaterals  LV Gram:overall normal LV function . EF 40%  Complications: No apparent complications Patient did tolerate procedure well.  Contrast  used:110 CC  Conclusions:  1. Mild- moderate irregularities. The large LAD and the large dominant LCx have only minor luminal irregularities. He has a small OM branch that is occluded  and a small non dominant RCA that is also occluded. These are likely the source of his abnormal myoview.  2. Normal LV function.   He should be at low risk for his upcoming vascular surgery.   07/2014 MPI IMPRESSION: 1. Abnormal study. There is a small region of ischemia in the inferolateral wall which would be reasonably consistent with the patient's known history of CAD.  2. Normal left ventricular wall motion.  3. Left ventricular ejection fraction 61%  4. Low-risk stress test findings*.       Assessment and Plan   1. HTN - he is at goal,continue current meds  2. HL - encouarged statin compliance - request pcp labs.   3. PAD - continue to follow with vascular.  - no recent symptoms, continue current meds  4. CAD - no symptoms, continue current meds  F/u 1 year       Arnoldo Lenis, M.D., F.A.C.C.

## 2018-03-01 NOTE — Patient Instructions (Signed)
[place] at [facility]  Discharge Instructions:  Your chemotherapy pump was removed today and the Neulasta Onpro was applied.  Call for any questions or concerns.  _______________________________________________________________  Thank you for choosing [place] at [facility] to provide your oncology and hematology care.  To afford each patient quality time with our providers, please arrive at least 15 minutes before your scheduled appointment.  You need to re-schedule your appointment if you arrive 10 or more minutes late.  We strive to give you quality time with our providers, and arriving late affects you and other patients whose appointments are after yours.  Also, if you no show three or more times for appointments you may be dismissed from the clinic.  Again, thank you for choosing [place] at [facility] hope is that these requests will allow you access to exceptional care and in a timely manner. _______________________________________________________________  If you have questions after your visit, please contact our office at [phone] between the hours of 8:30 a.m. and 5:00 p.m. Voicemails left after 4:30 p.m. will not be returned until the following business day. _______________________________________________________________  For prescription refill requests, have your pharmacy contact our office. _______________________________________________________________  Recommendations made by the consultant and any test results will be sent to your referring physician. _______________________________________________________________

## 2018-03-01 NOTE — Progress Notes (Signed)
Patients chemotherapy pump removed per protocol.  Port site clean and dry with no bruising or swelling noted at site.  Good blood return noted with flush and denied pain.  Band aid applied. Neulasta Onpro applied with green indicator light flashing and no alarms or complaints at site.  Band aid applied to port.  Reinforced teaching for oxaliplatin and cold sensitivities with understanding verbalized.  VSS with discharge and left with no s/s of distress noted.

## 2018-03-01 NOTE — Patient Instructions (Signed)

## 2018-03-02 ENCOUNTER — Encounter (HOSPITAL_COMMUNITY): Payer: Self-pay

## 2018-03-03 DIAGNOSIS — C169 Malignant neoplasm of stomach, unspecified: Secondary | ICD-10-CM | POA: Diagnosis not present

## 2018-03-07 ENCOUNTER — Encounter: Payer: Self-pay | Admitting: Cardiology

## 2018-03-07 DIAGNOSIS — Z6824 Body mass index (BMI) 24.0-24.9, adult: Secondary | ICD-10-CM | POA: Diagnosis not present

## 2018-03-07 DIAGNOSIS — Z23 Encounter for immunization: Secondary | ICD-10-CM | POA: Diagnosis not present

## 2018-03-07 DIAGNOSIS — Z0000 Encounter for general adult medical examination without abnormal findings: Secondary | ICD-10-CM | POA: Diagnosis not present

## 2018-03-07 DIAGNOSIS — I2510 Atherosclerotic heart disease of native coronary artery without angina pectoris: Secondary | ICD-10-CM | POA: Diagnosis not present

## 2018-03-07 DIAGNOSIS — Z7689 Persons encountering health services in other specified circumstances: Secondary | ICD-10-CM | POA: Diagnosis not present

## 2018-03-14 ENCOUNTER — Encounter (HOSPITAL_COMMUNITY): Payer: Self-pay

## 2018-03-14 ENCOUNTER — Other Ambulatory Visit: Payer: Self-pay

## 2018-03-14 ENCOUNTER — Inpatient Hospital Stay (HOSPITAL_COMMUNITY): Payer: Medicare Other | Admitting: Dietician

## 2018-03-14 ENCOUNTER — Inpatient Hospital Stay (HOSPITAL_COMMUNITY): Payer: Medicare Other | Attending: Hematology

## 2018-03-14 ENCOUNTER — Inpatient Hospital Stay (HOSPITAL_COMMUNITY): Payer: Medicare Other

## 2018-03-14 VITALS — BP 122/73 | HR 78 | Temp 98.9°F | Resp 16 | Wt 183.4 lb

## 2018-03-14 DIAGNOSIS — C165 Malignant neoplasm of lesser curvature of stomach, unspecified: Secondary | ICD-10-CM | POA: Diagnosis not present

## 2018-03-14 DIAGNOSIS — C169 Malignant neoplasm of stomach, unspecified: Secondary | ICD-10-CM | POA: Insufficient documentation

## 2018-03-14 DIAGNOSIS — K581 Irritable bowel syndrome with constipation: Secondary | ICD-10-CM | POA: Diagnosis not present

## 2018-03-14 DIAGNOSIS — I714 Abdominal aortic aneurysm, without rupture: Secondary | ICD-10-CM | POA: Diagnosis not present

## 2018-03-14 DIAGNOSIS — Z5111 Encounter for antineoplastic chemotherapy: Secondary | ICD-10-CM | POA: Diagnosis not present

## 2018-03-14 DIAGNOSIS — I10 Essential (primary) hypertension: Secondary | ICD-10-CM | POA: Diagnosis not present

## 2018-03-14 DIAGNOSIS — K1379 Other lesions of oral mucosa: Secondary | ICD-10-CM | POA: Diagnosis not present

## 2018-03-14 LAB — CBC WITH DIFFERENTIAL/PLATELET
BASOS ABS: 0.1 10*3/uL (ref 0.0–0.1)
Basophils Relative: 0 %
EOS PCT: 0 %
Eosinophils Absolute: 0 10*3/uL (ref 0.0–0.7)
HEMATOCRIT: 34.3 % — AB (ref 39.0–52.0)
HEMOGLOBIN: 11 g/dL — AB (ref 13.0–17.0)
LYMPHS PCT: 13 %
Lymphs Abs: 3.4 10*3/uL (ref 0.7–4.0)
MCH: 28.9 pg (ref 26.0–34.0)
MCHC: 32.1 g/dL (ref 30.0–36.0)
MCV: 90.3 fL (ref 78.0–100.0)
MONO ABS: 1.1 10*3/uL (ref 0.1–1.0)
Monocytes Relative: 4 %
Neutro Abs: 21.6 10*3/uL (ref 1.7–7.7)
Neutrophils Relative %: 83 %
Platelets: 105 10*3/uL — ABNORMAL LOW (ref 150–400)
RBC: 3.8 MIL/uL — AB (ref 4.22–5.81)
RDW: 16.7 % — ABNORMAL HIGH (ref 11.5–15.5)
WBC: 26.2 10*3/uL — ABNORMAL HIGH (ref 4.0–10.5)

## 2018-03-14 LAB — COMPREHENSIVE METABOLIC PANEL
ALBUMIN: 3.4 g/dL — AB (ref 3.5–5.0)
ALK PHOS: 101 U/L (ref 38–126)
ALT: 18 U/L (ref 17–63)
AST: 20 U/L (ref 15–41)
Anion gap: 8 (ref 5–15)
BUN: 12 mg/dL (ref 6–20)
CALCIUM: 9.1 mg/dL (ref 8.9–10.3)
CO2: 27 mmol/L (ref 22–32)
CREATININE: 1.08 mg/dL (ref 0.61–1.24)
Chloride: 104 mmol/L (ref 101–111)
GFR calc Af Amer: >60 mL/min (ref >60)
GFR calc non Af Amer: >60 mL/min (ref >60)
GLUCOSE: 170 mg/dL — AB (ref 65–99)
Potassium: 3.9 mmol/L (ref 3.5–5.1)
SODIUM: 139 mmol/L (ref 135–145)
Total Bilirubin: 0.3 mg/dL (ref 0.3–1.2)
Total Protein: 6.4 g/dL — ABNORMAL LOW (ref 6.5–8.1)

## 2018-03-14 MED ORDER — SODIUM CHLORIDE 0.9 % IV SOLN
50.0000 mg/m2 | Freq: Once | INTRAVENOUS | Status: AC
  Administered 2018-03-14: 110 mg via INTRAVENOUS
  Filled 2018-03-14: qty 11

## 2018-03-14 MED ORDER — PALONOSETRON HCL INJECTION 0.25 MG/5ML
0.2500 mg | Freq: Once | INTRAVENOUS | Status: AC
  Administered 2018-03-14: 0.25 mg via INTRAVENOUS

## 2018-03-14 MED ORDER — DEXAMETHASONE SODIUM PHOSPHATE 10 MG/ML IJ SOLN
INTRAMUSCULAR | Status: AC
  Filled 2018-03-14: qty 1

## 2018-03-14 MED ORDER — SODIUM CHLORIDE 0.9 % IV SOLN
INTRAVENOUS | Status: DC
  Administered 2018-03-14: 12:00:00 via INTRAVENOUS

## 2018-03-14 MED ORDER — SODIUM CHLORIDE 0.9% FLUSH
10.0000 mL | INTRAVENOUS | Status: DC | PRN
  Administered 2018-03-14: 10 mL
  Filled 2018-03-14: qty 10

## 2018-03-14 MED ORDER — PALONOSETRON HCL INJECTION 0.25 MG/5ML
INTRAVENOUS | Status: AC
  Filled 2018-03-14: qty 5

## 2018-03-14 MED ORDER — DEXAMETHASONE SODIUM PHOSPHATE 10 MG/ML IJ SOLN
10.0000 mg | Freq: Once | INTRAMUSCULAR | Status: AC
  Administered 2018-03-14: 10 mg via INTRAVENOUS

## 2018-03-14 MED ORDER — DEXTROSE 5 % IV SOLN
Freq: Once | INTRAVENOUS | Status: AC
  Administered 2018-03-14: 11:00:00 via INTRAVENOUS

## 2018-03-14 MED ORDER — SODIUM CHLORIDE 0.9 % IV SOLN: 10.0000 mg | Freq: Once | INTRAVENOUS | Status: DC

## 2018-03-14 MED ORDER — SODIUM CHLORIDE 0.9 % IV SOLN
2600.0000 mg/m2 | INTRAVENOUS | Status: DC
  Administered 2018-03-14: 5550 mg via INTRAVENOUS
  Filled 2018-03-14: qty 111

## 2018-03-14 MED ORDER — OXALIPLATIN CHEMO INJECTION 100 MG/20ML
85.0000 mg/m2 | Freq: Once | INTRAVENOUS | Status: AC
  Administered 2018-03-14: 180 mg via INTRAVENOUS
  Filled 2018-03-14: qty 36

## 2018-03-14 MED ORDER — DEXTROSE 5 % IV SOLN
400.0000 mg | Freq: Once | INTRAVENOUS | Status: AC
  Administered 2018-03-14: 400 mg via INTRAVENOUS
  Filled 2018-03-14: qty 17.5

## 2018-03-14 NOTE — Patient Instructions (Signed)
[facility] Discharge Instructions for Patients Receiving Chemotherapy   Beginning January 23rd 2017 lab work for the Cancer Center will be done in the  Main lab at [facility] on 1st floor. If you have a lab appointment with the Cancer Center please come in thru the  Main Entrance and check in at the main information desk   Today you received the following chemotherapy agents   To help prevent nausea and vomiting after your treatment, we encourage you to take your nausea medication     If you develop nausea and vomiting, or diarrhea that is not controlled by your medication, call the clinic.  The clinic phone number is [phone]. Office hours are Monday-Friday 8:30am-5:00pm.  BELOW ARE SYMPTOMS THAT SHOULD BE REPORTED IMMEDIATELY:  *FEVER GREATER THAN 101.0 F  *CHILLS WITH OR WITHOUT FEVER  NAUSEA AND VOMITING THAT IS NOT CONTROLLED WITH YOUR NAUSEA MEDICATION  *UNUSUAL SHORTNESS OF BREATH  *UNUSUAL BRUISING OR BLEEDING  TENDERNESS IN MOUTH AND THROAT WITH OR WITHOUT PRESENCE OF ULCERS  *URINARY PROBLEMS  *BOWEL PROBLEMS  UNUSUAL RASH Items with * indicate a potential emergency and should be followed up as soon as possible. If you have an emergency after office hours please contact your primary care physician or go to the nearest emergency department.  Please call the clinic during office hours if you have any questions or concerns.   You may also contact the Patient Navigator at [phone] should you have any questions or need assistance in obtaining follow up care.      Resources For Cancer Patients and their Caregivers ? American Cancer Society: Can assist with transportation, wigs, general needs, runs Look Good Feel Better.        1-888-227-6333 ? Cancer Care: Provides financial assistance, online support groups, medication/co-pay assistance.  1-800-813-HOPE (4673) ? [facility] Assists [place] cancer  patients and their families through emotional , educational and financial support.  336-427-4357 ? [place] DSS Where to apply for food stamps, Medicaid and utility assistance. 336-342-1394 ? [facility]: Transportation to medical appointments. 336-347-2287 ? Social Security Administration: May apply for disability if have a Stage IV cancer. 336-342-7796 1-800-772-1213 ? [place] Aging, Disability and Transit Services: Assists with nutrition, care and transit needs. 336-349-2343         

## 2018-03-14 NOTE — Progress Notes (Signed)
Follow Up Nutition Assessment  ASSESSMENT: 57 y/o male PMHx CAD, CKD2, PVD, CVA, Tobacco abuse, HTN/HLD. Developed acute epigastric pain w/ n/v and globus sensation Dec of 2018. 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 PET confirmed hypermetabolic, ultimately staged [id number]. Undergoing neoadjuvant chemo w/ tentative plans to undergo gastrectomy (partial vs total).   Patient is down approximately 1.5 lbs since RD last saw 2 weeks ago.   Today, he says he is now eating more frequently as he had been asked; he had been only eating meals at dinner time. He says he had 2 bowls of honey bunches of oats this morning. He says he tried greek yogurt; he was not ecstatic about it. He reports eating high protein sources often, namely chicken and fish.   He reports a disorganized list of side effect. He has mild nausea and constipation. He believes the constipation was from overuse of his imodium, which was used to treat diarrhea from his [place]. He says he cant sleep because his stomach hurts and he can feel 2 bumps in his stomach which he perceives as being the lining of his stomach. He has runny eyes and nose, only when at chemotherapy. He does not have seasonal allergies, but does feel these symptoms are from an allergy.  He consumed all of the last case of Ensure. He says he drank 4 of them a day until he ran out. He has not bought more due to cost.   Again, difficult to determine the areas that need to be addressed as patient notes ever-changing issues.   Wt Readings from Last 10 Encounters:  03/14/18 183 lb 6.4 oz (83.2 kg)  03/01/18 187 lb 12.8 oz (85.2 kg)  02/28/18 184 lb 9.6 oz (83.7 kg)  02/14/18 190 lb 12.8 oz (86.5 kg)  02/08/18 191 lb (86.6 kg)  02/07/18 191 lb (86.6 kg)  01/31/18 197 lb 14.4 oz (89.8 kg)  01/24/18 191 lb (86.6 kg)  01/17/18 195 lb 14.4 oz (88.9 kg)  12/15/17 197 lb 11.2 oz  (89.7 kg)   MEDICATIONS:  Chemo: 5FU, Taxotere, oxaliplatin, leucovorin, fluorouracil Decadron (day after chemo x2 days) Supportive meds: compazine, ppi, zofran, magic mouthwash, oxycodone, b12, amitiza, imodium  LABS:  Albumin: 3.4, down from 3.7 2 weeks ago. WBC is over 26k.   Recent Labs  Lab 03/14/18 1003  NA 139  K 3.9  CL 104  CO2 27  BUN 12  CREATININE 1.08  CALCIUM 9.1  GLUCOSE 170*   ANTHROPOMETRICS: Height: 6'1" (185.42 cm) Weight: 183.4 lbs (83.36 kg) BMI: 24.36-healthy wt for ht Wt change: -6 lbs x 2 weeks  ESTIMATED ENERGY NEEDS:  Kcal: 2400-2600 (29-31 kcal/kg bw) Protein: 118-125g Pro (1.3-1.5 g/kg bw) Fluid: 2.4-2.6 L (1 ml/kcal)   NUTRITION DIAGNOSIS:  Inadequate oral intake r/t decreased appetite and lack of knowledge/understanding AEB loss of 14 lbs x 4 w3 months   DOCUMENTATION CODES:  No longer applicable as weight loss is no longer significant for time frame.   INTERVENTION:  Fortunately, patient seems to have retained some of RDs education as he reports beneficial eating behavior changes. RD reiterated that he should be eating high protein/kcal foods every few hours. He has a substantially heightened energy expenditure.   He seems to be suffering from an ever-changing smorgasbord of side effects.  He is advised to use the appropriate supportive medications as directed.   This is his final scheduled chemotherapy. He  says he was told he will need to have surgery to remove cancer. He has not been told any details yet though. Question if patient will have a jejunostomy placed? May need to discuss tube feeding in future.   He is presented with his third case of Ensure today. He has received the typically allotted 3 cases of Ensure  Patient no longer meets criteria for malnutrition as his weight loss is no longer significant for the time frame.   RD will continue to follow.   GOAL:  Oral intake to meet >90% of needs, wt stability, increased  knowledge of appropriate eating habits  PROGRESSING  MONITOR:  Weight, tolerance of diet/chemo, evidence of education comprehension  Next Visit: To be determined. Has PET to reevaluate disease/surgical candidacy on 6/24  [name] RD, LDN, [facility] [place] [facility] Available Tues-Sat via Pager: [phone] 03/14/2018 12:08 PM

## 2018-03-14 NOTE — Progress Notes (Signed)
Labs reviewed with Dr. Walden Field today. Proceed with treatment today. Will Hold neulasta on pro tomorrow.   Treatment given per orders. Patient tolerated it well without problems. Vitals stable and discharged home from clinic ambulatory. Follow up as scheduled.

## 2018-03-15 ENCOUNTER — Inpatient Hospital Stay (HOSPITAL_COMMUNITY): Payer: Medicare Other

## 2018-03-15 ENCOUNTER — Encounter (HOSPITAL_COMMUNITY): Payer: Self-pay

## 2018-03-15 VITALS — BP 134/78 | HR 98 | Temp 98.0°F | Resp 16

## 2018-03-15 DIAGNOSIS — C165 Malignant neoplasm of lesser curvature of stomach, unspecified: Secondary | ICD-10-CM | POA: Diagnosis not present

## 2018-03-15 DIAGNOSIS — C169 Malignant neoplasm of stomach, unspecified: Secondary | ICD-10-CM

## 2018-03-15 DIAGNOSIS — K1379 Other lesions of oral mucosa: Secondary | ICD-10-CM | POA: Diagnosis not present

## 2018-03-15 DIAGNOSIS — I10 Essential (primary) hypertension: Secondary | ICD-10-CM | POA: Diagnosis not present

## 2018-03-15 DIAGNOSIS — I714 Abdominal aortic aneurysm, without rupture: Secondary | ICD-10-CM | POA: Diagnosis not present

## 2018-03-15 DIAGNOSIS — K581 Irritable bowel syndrome with constipation: Secondary | ICD-10-CM | POA: Diagnosis not present

## 2018-03-15 DIAGNOSIS — Z5111 Encounter for antineoplastic chemotherapy: Secondary | ICD-10-CM | POA: Diagnosis not present

## 2018-03-15 MED ORDER — HEPARIN SOD (PORK) LOCK FLUSH 100 UNIT/ML IV SOLN
500.0000 [IU] | Freq: Once | INTRAVENOUS | Status: AC | PRN
  Administered 2018-03-15: 500 [IU]

## 2018-03-15 MED ORDER — SODIUM CHLORIDE 0.9% FLUSH
10.0000 mL | INTRAVENOUS | Status: DC | PRN
  Administered 2018-03-15: 10 mL
  Filled 2018-03-15: qty 10

## 2018-03-15 MED ORDER — HEPARIN SOD (PORK) LOCK FLUSH 100 UNIT/ML IV SOLN
INTRAVENOUS | Status: AC
  Filled 2018-03-15: qty 5

## 2018-03-15 NOTE — Patient Instructions (Signed)
[place] at [facility]  Discharge Instructions:  Your chemotherapy pump was disconnected today.  _______________________________________________________________  Thank you for choosing [place] at [facility] to provide your oncology and hematology care.  To afford each patient quality time with our providers, please arrive at least 15 minutes before your scheduled appointment.  You need to re-schedule your appointment if you arrive 10 or more minutes late.  We strive to give you quality time with our providers, and arriving late affects you and other patients whose appointments are after yours.  Also, if you no show three or more times for appointments you may be dismissed from the clinic.  Again, thank you for choosing [place] at [place] hope is that these requests will allow you access to exceptional care and in a timely manner. _______________________________________________________________  If you have questions after your visit, please contact our office at [phone] between the hours of 8:30 a.m. and 5:00 p.m. Voicemails left after 4:30 p.m. will not be returned until the following business day. _______________________________________________________________  For prescription refill requests, have your pharmacy contact our office. _______________________________________________________________  Recommendations made by the consultant and any test results will be sent to your referring physician. _______________________________________________________________

## 2018-03-15 NOTE — Progress Notes (Signed)
Patients chemotherapy pump disconnected with no complaints with flush.  Good blood return noted with flush.  Denied pain, SOB, fevers, or chills.  No bruising or swelling noted at site.  Band aid applied.  VSS with discharge and left ambulatory with no s/s of distress noted.

## 2018-03-16 ENCOUNTER — Encounter (HOSPITAL_COMMUNITY): Payer: Self-pay

## 2018-03-17 ENCOUNTER — Other Ambulatory Visit: Payer: Self-pay

## 2018-03-17 ENCOUNTER — Emergency Department (HOSPITAL_COMMUNITY): Payer: Medicare Other

## 2018-03-17 ENCOUNTER — Observation Stay (HOSPITAL_COMMUNITY)
Admission: EM | Admit: 2018-03-17 | Discharge: 2018-03-18 | Disposition: A | Discharge status: Home / Self Care | Payer: Medicare Other | Attending: Family Medicine | Admitting: Family Medicine

## 2018-03-17 ENCOUNTER — Other Ambulatory Visit (HOSPITAL_COMMUNITY): Payer: Self-pay

## 2018-03-17 ENCOUNTER — Encounter (HOSPITAL_COMMUNITY): Payer: Self-pay | Admitting: Emergency Medicine

## 2018-03-17 ENCOUNTER — Observation Stay (HOSPITAL_COMMUNITY): Payer: Medicare Other

## 2018-03-17 DIAGNOSIS — D72829 Elevated white blood cell count, unspecified: Secondary | ICD-10-CM | POA: Diagnosis not present

## 2018-03-17 DIAGNOSIS — E782 Mixed hyperlipidemia: Secondary | ICD-10-CM

## 2018-03-17 DIAGNOSIS — Z7982 Long term (current) use of aspirin: Secondary | ICD-10-CM | POA: Insufficient documentation

## 2018-03-17 DIAGNOSIS — C169 Malignant neoplasm of stomach, unspecified: Secondary | ICD-10-CM | POA: Diagnosis present

## 2018-03-17 DIAGNOSIS — R748 Abnormal levels of other serum enzymes: Secondary | ICD-10-CM | POA: Diagnosis present

## 2018-03-17 DIAGNOSIS — I129 Hypertensive chronic kidney disease with stage 1 through stage 4 chronic kidney disease, or unspecified chronic kidney disease: Secondary | ICD-10-CM | POA: Insufficient documentation

## 2018-03-17 DIAGNOSIS — R55 Syncope and collapse: Secondary | ICD-10-CM

## 2018-03-17 DIAGNOSIS — I10 Essential (primary) hypertension: Secondary | ICD-10-CM

## 2018-03-17 DIAGNOSIS — E785 Hyperlipidemia, unspecified: Secondary | ICD-10-CM | POA: Diagnosis present

## 2018-03-17 DIAGNOSIS — R42 Dizziness and giddiness: Secondary | ICD-10-CM | POA: Diagnosis not present

## 2018-03-17 DIAGNOSIS — F17290 Nicotine dependence, other tobacco product, uncomplicated: Secondary | ICD-10-CM | POA: Insufficient documentation

## 2018-03-17 DIAGNOSIS — N182 Chronic kidney disease, stage 2 (mild): Secondary | ICD-10-CM | POA: Insufficient documentation

## 2018-03-17 DIAGNOSIS — I2510 Atherosclerotic heart disease of native coronary artery without angina pectoris: Secondary | ICD-10-CM | POA: Diagnosis not present

## 2018-03-17 DIAGNOSIS — K219 Gastro-esophageal reflux disease without esophagitis: Secondary | ICD-10-CM | POA: Insufficient documentation

## 2018-03-17 DIAGNOSIS — Z79899 Other long term (current) drug therapy: Secondary | ICD-10-CM | POA: Diagnosis not present

## 2018-03-17 DIAGNOSIS — C163 Malignant neoplasm of pyloric antrum: Secondary | ICD-10-CM | POA: Diagnosis not present

## 2018-03-17 DIAGNOSIS — I959 Hypotension, unspecified: Principal | ICD-10-CM | POA: Insufficient documentation

## 2018-03-17 LAB — CBC WITH DIFFERENTIAL/PLATELET
Basophils Absolute: 0 10*3/uL (ref 0.0–0.1)
Basophils Relative: 0 %
Eosinophils Absolute: 0 10*3/uL (ref 0.0–0.7)
Eosinophils Relative: 0 %
HEMATOCRIT: 31.8 % — AB (ref 39.0–52.0)
Hemoglobin: 10.7 g/dL — ABNORMAL LOW (ref 13.0–17.0)
LYMPHS PCT: 16 %
Lymphs Abs: 3.2 10*3/uL (ref 0.7–4.0)
MCH: 29.7 pg (ref 26.0–34.0)
MCHC: 33.6 g/dL (ref 30.0–36.0)
MCV: 88.3 fL (ref 78.0–100.0)
MONO ABS: 0.2 10*3/uL (ref 0.1–1.0)
MONOS PCT: 1 %
NEUTROS ABS: 16.8 10*3/uL — AB (ref 1.7–7.7)
Neutrophils Relative %: 83 %
Platelets: 128 10*3/uL — ABNORMAL LOW (ref 150–400)
RBC: 3.6 MIL/uL — ABNORMAL LOW (ref 4.22–5.81)
RDW: 16.5 % — ABNORMAL HIGH (ref 11.5–15.5)
WBC: 20.3 10*3/uL — ABNORMAL HIGH (ref 4.0–10.5)

## 2018-03-17 LAB — COMPREHENSIVE METABOLIC PANEL
ALT: 42 U/L (ref 17–63)
ANION GAP: 8 (ref 5–15)
AST: 37 U/L (ref 15–41)
Albumin: 3.5 g/dL (ref 3.5–5.0)
Alkaline Phosphatase: 86 U/L (ref 38–126)
BILIRUBIN TOTAL: 0.4 mg/dL (ref 0.3–1.2)
BUN: 34 mg/dL — AB (ref 6–20)
CO2: 24 mmol/L (ref 22–32)
Calcium: 9.2 mg/dL (ref 8.9–10.3)
Chloride: 104 mmol/L (ref 101–111)
Creatinine, Ser: 1.23 mg/dL (ref 0.61–1.24)
Glucose, Bld: 106 mg/dL — ABNORMAL HIGH (ref 65–99)
Potassium: 4.4 mmol/L (ref 3.5–5.1)
Sodium: 136 mmol/L (ref 135–145)
TOTAL PROTEIN: 6.4 g/dL — AB (ref 6.5–8.1)

## 2018-03-17 LAB — LACTIC ACID, PLASMA: Lactic Acid, Venous: 1.6 mmol/L (ref 0.5–1.9)

## 2018-03-17 LAB — LIPASE, BLOOD: LIPASE: 174 U/L — AB (ref 11–51)

## 2018-03-17 LAB — TROPONIN I

## 2018-03-17 LAB — CBG MONITORING, ED: Glucose-Capillary: 103 mg/dL — ABNORMAL HIGH (ref 65–99)

## 2018-03-17 MED ORDER — ENOXAPARIN SODIUM 40 MG/0.4ML ~~LOC~~ SOLN
40.0000 mg | SUBCUTANEOUS | Status: DC
  Administered 2018-03-17: 40 mg via SUBCUTANEOUS
  Filled 2018-03-17: qty 0.4

## 2018-03-17 MED ORDER — ASPIRIN EC 81 MG PO TBEC
81.0000 mg | DELAYED_RELEASE_TABLET | Freq: Every day | ORAL | Status: DC
  Administered 2018-03-17 – 2018-03-18 (×2): 81 mg via ORAL
  Filled 2018-03-17 (×2): qty 1

## 2018-03-17 MED ORDER — SODIUM CHLORIDE 0.9 % IV BOLUS
1000.0000 mL | Freq: Once | INTRAVENOUS | Status: AC
  Administered 2018-03-17: 1000 mL via INTRAVENOUS

## 2018-03-17 MED ORDER — PANTOPRAZOLE SODIUM 40 MG PO TBEC
40.0000 mg | DELAYED_RELEASE_TABLET | Freq: Two times a day (BID) | ORAL | Status: DC
  Administered 2018-03-17 – 2018-03-18 (×2): 40 mg via ORAL
  Filled 2018-03-17 (×2): qty 1

## 2018-03-17 MED ORDER — ROSUVASTATIN CALCIUM 10 MG PO TABS
10.0000 mg | ORAL_TABLET | Freq: Every day | ORAL | Status: DC
  Administered 2018-03-17: 10 mg via ORAL
  Filled 2018-03-17: qty 1

## 2018-03-17 MED ORDER — SODIUM CHLORIDE 0.9 % IV SOLN: INTRAVENOUS | Status: DC

## 2018-03-17 MED ORDER — ONDANSETRON HCL 4 MG/2ML IJ SOLN
4.0000 mg | Freq: Four times a day (QID) | INTRAMUSCULAR | Status: DC | PRN
  Filled 2018-03-17: qty 2

## 2018-03-17 MED ORDER — LOPERAMIDE HCL 2 MG PO CAPS: 2.0000 mg | ORAL_CAPSULE | ORAL | Status: DC | PRN

## 2018-03-17 MED ORDER — ACETAMINOPHEN 325 MG PO TABS: 650.0000 mg | ORAL_TABLET | Freq: Four times a day (QID) | ORAL | Status: DC | PRN

## 2018-03-17 MED ORDER — DEXAMETHASONE 4 MG PO TABS: 8.0000 mg | ORAL_TABLET | Freq: Every day | ORAL | Status: DC

## 2018-03-17 MED ORDER — ONDANSETRON HCL 4 MG PO TABS: 4.0000 mg | ORAL_TABLET | Freq: Four times a day (QID) | ORAL | Status: DC | PRN

## 2018-03-17 MED ORDER — PROCHLORPERAZINE MALEATE 5 MG PO TABS: 10.0000 mg | ORAL_TABLET | Freq: Four times a day (QID) | ORAL | Status: DC | PRN

## 2018-03-17 MED ORDER — ACETAMINOPHEN 650 MG RE SUPP: 650.0000 mg | Freq: Four times a day (QID) | RECTAL | Status: DC | PRN

## 2018-03-17 MED ORDER — VALACYCLOVIR HCL 500 MG PO TABS
500.0000 mg | ORAL_TABLET | Freq: Every day | ORAL | Status: DC
  Administered 2018-03-17: 500 mg via ORAL
  Filled 2018-03-17: qty 1

## 2018-03-17 MED ORDER — ONDANSETRON HCL 4 MG PO TABS: 8.0000 mg | ORAL_TABLET | Freq: Two times a day (BID) | ORAL | Status: DC | PRN

## 2018-03-17 MED ORDER — SODIUM CHLORIDE 0.9 % IV SOLN
INTRAVENOUS | Status: DC
  Administered 2018-03-17 – 2018-03-18 (×2): via INTRAVENOUS

## 2018-03-17 NOTE — ED Triage Notes (Signed)
Pt states was sitting out side smoking weed this am and started having dizziness. Also states has had chest discomfort since yesterday. Pt reports he has stomach cancer.

## 2018-03-17 NOTE — H&P (Signed)
History and Physical  TROWA KINDRICK PPI:[id number] DOB: 1961/07/21 DOA: 03/17/2018  Referring physician: Dr Deretha Emory, ED physician PCP: Lovey Newcomer, PA  Outpatient Specialists:   Patient Coming From: home  Chief Complaint: dizziness, abdominal pain  HPI: Todd [name] is a 57 y.o. male with a history of gastric adenocarcinoma, chronic kidney disease stage II, coronary artery disease, hypertension, hyperlipidemia, history of stroke in 2008 with residual right-sided weakness.  Patient seen for dizziness and lightheadedness when he was outside smoking marijuana.  As he continued to feel like he was going to pass out, the patient came to emergency department for evaluation.  Upon arrival, it was noted that his blood pressure was quite low: 70s over 40s.  Patient received 2 L IV fluids with improvement.  Due to his severe hypotension, is requested that I admit him overnight for observation.  He has been taking his medications.  He has had a 30 pound weight loss over the past 3 to 4 months.  He is receiving chemotherapy, last dose was yesterday.  In conversing with the patient, he was also noted that the patient's been having episodic abdominal pain that was worse over the past day.  His labs noted that his lipase was slightly elevated.  There have been no palliating or provoking factors to the patient's pain, which is described as dull and in the epigastric and left upper quadrant areas.  He does feel nauseated to some degree with a lack of appetite.   Review of Systems:   Pt denies any fevers, chills, vomiting, diarrhea, constipation, abdominal pain, shortness of breath, dyspnea on exertion, orthopnea, cough, wheezing, palpitations, headache, vision changes, lightheadedness, dizziness, melena, rectal bleeding.  Review of systems are otherwise negative  Past Medical History:  Diagnosis Date  . CAD (coronary artery disease)    a. Cath 07/2012 following abnormal nuclear stress test: 20-30%  LAD, 20-30% OM1, occluded branch off OM2 that fills slowly via collaterals, small nondominant RCA occluded proximally, fills slowly via collaterals.  . CKD (chronic kidney disease), stage II   . Family history of cancer   . Gastric adenocarcinoma (HCC)   . History of blood transfusion    "when I was a child"  . Hyperlipidemia   . Hypertension   . Peripheral vascular disease (HCC)    a. bilateral superficial femoral artery stents in May/August of 2012. b. Left femoral to above-knee popliteal bypass in 08/2012.  Marland Kitchen Renal insufficiency   . Stroke [facility])    2008 right side weakness  . Tobacco abuse    Past Surgical History:  Procedure Laterality Date  . ABDOMINAL AORTAGRAM N/A 04/27/2012   Procedure: ABDOMINAL Ronny Flurry;  Surgeon: Fransisco Hertz, MD;  Location: [facility] CATH LAB;  Service: Cardiovascular;  Laterality: N/A;  . ABDOMINAL AORTAGRAM N/A 08/09/2014   Procedure: ABDOMINAL Ronny Flurry;  Surgeon: Sherren Kerns, MD;  Location: [facility] CATH LAB;  Service: Cardiovascular;  Laterality: N/A;  . BIOPSY  10/06/2017   Procedure: BIOPSY;  Surgeon: Corbin Ade, MD;  Location: [facility] ENDO SUITE;  Service: Endoscopy;;  gastric   . CARDIAC CATHETERIZATION    . COLONOSCOPY    . COLONOSCOPY WITH PROPOFOL N/A 10/06/2017   Procedure: COLONOSCOPY WITH PROPOFOL;  Surgeon: Corbin Ade, MD;  Location: [facility] ENDO SUITE;  Service: Endoscopy;  Laterality: N/A;  1:45pm - patient can't come earlier due to transportation  . ESOPHAGOGASTRODUODENOSCOPY (EGD) WITH PROPOFOL N/A 10/06/2017   Procedure: ESOPHAGOGASTRODUODENOSCOPY (EGD) WITH PROPOFOL;  Surgeon: Corbin Ade, MD;  Location: [facility] ENDO SUITE;  Service: Endoscopy;  Laterality: N/A;  . EUS N/A 11/24/2017   Procedure: UPPER ENDOSCOPIC ULTRASOUND (EUS) RADIAL;  Surgeon: Rachael Fee, MD;  Location: [facility] ENDOSCOPY;  Service: Endoscopy;  Laterality: N/A;  . FEMORAL ARTERY - POPLITEAL ARTERY BYPASS GRAFT    . FEMORAL-POPLITEAL BYPASS GRAFT  09/26/2012   Procedure:  BYPASS GRAFT FEMORAL-POPLITEAL ARTERY;  Surgeon: Sherren Kerns, MD;  Location: [facility] OR;  Service: Vascular;  Laterality: Left;  using 6mm Gore-Tex Propaten Ringed Graft, Vein patch on the proximal and distal anastomosis  . FEMORAL-POPLITEAL BYPASS GRAFT Right 08/12/2014   Procedure:  FEMORAL-POPLITEAL ARTERY BYPASS WITH SAPHENOUS VEIN GRAFT, INTRAOPERATIVE ARTERIOGRAM;  Surgeon: Sherren Kerns, MD;  Location: [facility] OR;  Service: Vascular;  Laterality: Right;  . INTRAOPERATIVE ARTERIOGRAM  08/12/2014   Procedure: INTRA OPERATIVE ARTERIOGRAM;  Surgeon: Sherren Kerns, MD;  Location: [facility] OR;  Service: Vascular;;  . POLYPECTOMY  10/06/2017   Procedure: POLYPECTOMY;  Surgeon: Corbin Ade, MD;  Location: [facility] ENDO [facility]ITE;  Service: Endoscopy;;  splenic flexure,  ascending  . PORTACATH PLACEMENT N/A 01/24/2018   Procedure: INSERTION PORT-A-CATH;  Surgeon: [name], MD;  Location: [facility] OR;  Service: General;  Laterality: N/A;  . PR VEIN BYPASS GRAFT,AORTO-FEM-POP    . stents in bil legs     Social History:  reports that he has been smoking cigars.  He started smoking about 43 years ago. He has a 25.00 pack-year smoking history. He has never used smokeless tobacco. He reports that he has current or past drug history. Drug: Marijuana. He reports that he does not drink alcohol. Patient lives at home  Allergies  Allergen Reactions  . Potassium-Containing Compounds Swelling    Unspecified cause of Swelling of lip after taking potassium pill    Family History  Problem Relation Age of Onset  . Diabetes Mother   . Heart disease Mother        before age 45- Triple BPG  . Cancer Mother        Luekemia  . Deep vein thrombosis Mother   . Hyperlipidemia Mother   . Hypertension Mother   . Heart attack Mother   . Peripheral vascular disease Mother   . Stroke Mother        x 3  . Pancreatic cancer Mother   . Arthritis Father   . Hypertension Father   . Kidney disease Father   . Cancer Maternal  Grandmother        NOS  . Diabetes Cousin   . Cancer Cousin        NOS, pat first cousin  . Cancer Cousin        NOS, pat first cousin  . Colon cancer Neg Hx   . Gastric cancer Neg Hx   . Esophageal cancer Neg Hx       Prior to Admission medications   Medication Sig Start Date End Date Taking? Authorizing Provider  amLODipine (NORVASC) 10 MG tablet TAKE ONE TABLET BY MOUTH ONCE DAILY 11/11/17  Yes Branch, Dorothe Pea, MD  aspirin EC 81 MG tablet Take 81 mg by mouth daily.   Yes [provider]  dexamethasone (DECADRON) 4 MG tablet Take 2 tablets (8 mg total) by mouth daily. Start the day after chemotherapy for 2 days. Take with food. 01/26/18  Yes Higgs, Minta Balsam, MD  DOCEtaxel (TAXOTERE IV) Inject into the vein.   Yes [provider]  fluorouracil CALGB 02725 in sodium chloride 0.9 %  150 mL Inject into the vein over 24 hr.   Yes [provider]  LEUCOVORIN CALCIUM IV Inject into the vein.   Yes [provider]  lidocaine-prilocaine (EMLA) cream Apply to affected area once 01/26/18  Yes Higgs, Minta Balsam, MD  lisinopril (PRINIVIL,ZESTRIL) 40 MG tablet Take 40 mg by mouth at bedtime.   Yes [provider]  loperamide (IMODIUM) 2 MG capsule Take 1 capsule (2 mg total) by mouth as needed for diarrhea or loose stools. 02/28/18  Yes Higgs, Minta Balsam, MD  ondansetron (ZOFRAN) 8 MG tablet Take 1 tablet (8 mg total) by mouth 2 (two) times daily as needed for refractory nausea / vomiting. Start on day 3 after chemotherapy. 01/26/18  Yes Higgs, Minta Balsam, MD  OXALIPLATIN IV Inject into the vein.   Yes [provider]  pantoprazole (PROTONIX) 40 MG tablet TAKE 1 TABLET BY MOUTH TWICE DAILY BEFORE A MEAL 01/05/18  Yes Gelene Mink, NP  prochlorperazine (COMPAZINE) 10 MG tablet Take 1 tablet (10 mg total) by mouth every 6 (six) hours as needed (Nausea or vomiting). 01/26/18  Yes Higgs, Minta Balsam, MD  rosuvastatin (CRESTOR) 10 MG tablet Take 1 tablet (10 mg total) by mouth daily.  03/01/18  Yes Branch, Dorothe Pea, MD  valACYclovir (VALTREX) 500 MG tablet Take 500 mg by mouth at bedtime.   Yes [provider]  vitamin B-12 (CYANOCOBALAMIN) 1000 MCG tablet Take 1,000 mcg by mouth daily.   Yes [provider]    Physical Exam: BP 110/82   Pulse 82   Temp 98.7 F (37.1 C) (Oral)   Resp 17   SpO2 96%   . General: Middle-aged Caucasian male. Awake and alert and oriented x3. No acute cardiopulmonary distress.  Marland Kitchen HEENT: Normocephalic atraumatic.  Right and left ears normal in appearance.  Pupils equal, round, reactive to light. Extraocular muscles are intact. Sclerae anicteric and noninjected.  Moist mucosal membranes. No mucosal lesions.  . Neck: Neck supple without lymphadenopathy. No carotid bruits. No masses palpated.  . Cardiovascular: Regular rate with normal S1-S2 sounds. No murmurs, rubs, gallops auscultated. No JVD.  Marland Kitchen Respiratory: Good respiratory effort with no wheezes, rales, rhonchi. Lungs clear to auscultation bilaterally.  No accessory muscle use. . Abdomen: Soft, nontender, nondistended. Active bowel sounds. No masses or hepatosplenomegaly  . Skin: No rashes, lesions, or ulcerations.  Dry, warm to touch. 2+ dorsalis pedis and radial pulses. . Musculoskeletal: No calf or leg pain. All major joints not erythematous nontender.  No upper or lower joint deformation.  Good ROM.  No contractures  . Psychiatric: Intact judgment and insight. Pleasant and cooperative. . Neurologic: No focal neurological deficits. Strength is 5/5 and symmetric in upper and lower extremities.  Cranial nerves II through XII are grossly intact.           Labs on Admission: I have personally reviewed following labs and imaging studies  CBC: Recent Labs  Lab 03/14/18 1003 03/17/18 1320  WBC 26.2* 20.3*  NEUTROABS 21.6 16.8*  HGB 11.0* 10.7*  HCT 34.3* 31.8*  MCV 90.3 88.3  PLT 105* 128*   Basic Metabolic Panel: Recent Labs  Lab 03/14/18 1003 03/17/18 1320    NA 139 136  K 3.9 4.4  CL 104 104  CO2 27 24  GLUCOSE 170* 106*  BUN 12 34*  CREATININE 1.08 1.23  CALCIUM 9.1 9.2   GFR: Estimated Creatinine Clearance: 76.7 mL/min (by C-G formula based on SCr of 1.23 mg/dL). Liver Function Tests: Recent Labs  Lab 03/14/18 1003 03/17/18 1320  AST 20 37  ALT 18 42  ALKPHOS 101 86  BILITOT 0.3 0.4  PROT 6.4* 6.4*  ALBUMIN 3.4* 3.5   Recent Labs  Lab 03/17/18 1320  LIPASE 174*   No results for input(s): AMMONIA in the last 168 hours. Coagulation Profile: No results for input(s): INR, PROTIME in the last 168 hours. Cardiac Enzymes: Recent Labs  Lab 03/17/18 1524  TROPONINI <0.03   BNP (last 3 results) No results for input(s): PROBNP in the last 8760 hours. HbA1C: No results for input(s): HGBA1C in the last 72 hours. CBG: Recent Labs  Lab 03/17/18 1318  GLUCAP 103*   Lipid Profile: No results for input(s): CHOL, HDL, LDLCALC, TRIG, CHOLHDL, LDLDIRECT in the last 72 hours. Thyroid Function Tests: No results for input(s): TSH, T4TOTAL, FREET4, T3FREE, THYROIDAB in the last 72 hours. Anemia Panel: No results for input(s): VITAMINB12, FOLATE, FERRITIN, TIBC, IRON, RETICCTPCT in the last 72 hours. Urine analysis:    Component Value Date/Time   COLORURINE YELLOW 08/09/2014 1815   APPEARANCEUR CLEAR 08/09/2014 1815   LABSPEC 1.010 08/09/2014 1815   PHURINE 7.5 08/09/2014 1815   GLUCOSEU NEGATIVE 08/09/2014 1815   HGBUR LARGE (A) 08/09/2014 1815   BILIRUBINUR NEGATIVE 08/09/2014 1815   KETONESUR NEGATIVE 08/09/2014 1815   PROTEINUR NEGATIVE 08/09/2014 1815   UROBILINOGEN 1.0 08/09/2014 1815   NITRITE NEGATIVE 08/09/2014 1815   LEUKOCYTESUR NEGATIVE 08/09/2014 1815   Sepsis Labs: @LABRCNTIP (procalcitonin:4,lacticidven:4) ) Recent Results (from the past 240 hour(s))  Culture, blood (Routine X 2) w Reflex to ID Panel     Status: None (Preliminary result)   Collection Time: 03/17/18  1:41 PM  Result Value Ref Range Status    Specimen Description BLOOD LEFT ARM  Final   Special Requests   Final    BOTTLES DRAWN AEROBIC AND ANAEROBIC Blood Culture adequate volume Performed at [facility], [address] [place], [place] [address]    Culture PENDING  Incomplete   Report Status PENDING  Incomplete  Culture, blood (Routine X 2) w Reflex to ID Panel     Status: None (Preliminary result)   Collection Time: 03/17/18  1:41 PM  Result Value Ref Range Status   Specimen Description BLOOD RIGHT ARM  Final   Special Requests   Final    BOTTLES DRAWN AEROBIC AND ANAEROBIC Blood Culture adequate volume Performed at [facility], [address], [place], [place] [address]    Culture PENDING  Incomplete   Report Status PENDING  Incomplete     Radiological Exams on Admission: Dg Chest 2 View  Result Date: 03/17/2018 CLINICAL DATA:  NEAR SYNCOPE, PER ER NOTE, Pt states was sitting out side smoking weed this am and started having dizziness. Also states has had chest discomfort since yesterday. Pt reports he has stomach cancerHISTORY OF HTN, STROKE, CAD, CANCER, CKD EXAM: CHEST - 2 VIEW COMPARISON:  01/24/2018 FINDINGS: Cardiac silhouette is normal size. No mediastinal or hilar masses. No convincing adenopathy. Clear lungs.  No pleural effusion or pneumothorax. Left anterior chest wall Port-A-Cath is stable. Skeletal structures are intact. IMPRESSION: No active cardiopulmonary disease. Electronically Signed   By: Amie Portland M.D.   On: 03/17/2018 14:38     Assessment/Plan: Principal Problem:   Hypotension Active Problems:   Hypertension   Hyperlipidemia   CKD (chronic kidney disease), stage II   GERD (gastroesophageal reflux disease)   Malignant neoplasm of stomach (HCC)   Elevated lipase   Leukocytosis  This patient was discussed with the ED physician, including pertinent vitals, physical exam findings, labs, and imaging.  We also discussed care given by the ED provider.  1. Hypotension a. Hold  antihypertensives b. IV fluids c. Telemetry monitoring 2. Elevated lipase a. IV fluids b. Clear liquids c. Repeat lipase in the morning d. CT abdomen 3. Leukocytosis a. Patient receives dexamethasone on the day of and day following chemo, which explains his leukocytosis 4. GERD a. Continue PPI 5. Chronic kidney disease a. At baseline 6. Malignant neoplasm of stomach a. On chemo 7. Hyperlipidemia a.  8. Essential hypertension a. We will hold antihypertensives for now  DVT prophylaxis: Lovenox Consultants: None Code Status: Full code Family Communication: None Disposition Plan: Patient should be able to return home following improvement in stabilization of blood pressure and lipase   Levie Heritage, DO [facility] Pager [phone]  If 7PM-7AM, please contact night-coverage [email or web address] Password [email or web address]

## 2018-03-17 NOTE — ED Provider Notes (Addendum)
[facility] EMERGENCY DEPARTMENT Provider Note   CSN: [id number] Arrival date & time: 03/17/18  1244     History   Chief Complaint Chief Complaint  Patient presents with  . Near Syncope    HPI Todd [name] is a 57 y.o. male.  Patient hypotensive states that he has been feeling as if he is going to pass out and almost has passed out if he does not lay down several times over the last few days.  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  Followed by hematology oncology upstairs.  Patient also does have a past history of hypertension.  It sounds as if he still taking his hypertensive meds.  Patient's blood pressure upon arrival here was in the 74B systolic.  Patient denies any specific pain.     Past Medical History:  Diagnosis Date  . CAD (coronary artery disease)    a. Cath 07/2012 following abnormal nuclear stress test: 20-30% LAD, 20-30% OM1, occluded branch off OM2 that fills slowly via collaterals, small nondominant RCA occluded proximally, fills slowly via collaterals.  . CKD (chronic kidney disease), stage II   . Family history of cancer   . Gastric adenocarcinoma ([place])   . History of blood transfusion    "when I was a child"  . Hyperlipidemia   . Hypertension   . Peripheral vascular disease ([place])    a. bilateral superficial femoral artery stents in May/August of 2012. b. Left femoral to above-knee popliteal bypass in 08/2012.  Marland Kitchen Renal insufficiency   . Stroke [facility])    2008 right side weakness  . Tobacco abuse     Patient Active Problem List   Diagnosis Date Noted  . Gastric carcinoma ([place]) 01/23/2018  . Genetic testing 12/20/2017  . Family history of cancer   . IBS (irritable bowel syndrome) 11/28/2017  . Malignant neoplasm of stomach ([place]) 11/11/2017  . Constipation 08/30/2017  . GERD (gastroesophageal reflux disease) 08/30/2017  . Globus sensation 08/30/2017  . PAD (peripheral artery disease) ([place])  08/09/2014  . CAD (coronary artery disease) 07/22/2014  . Tobacco abuse 07/22/2014  . 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 Hypertension 07/19/2012  . Hyperlipidemia 07/19/2012  . PVD (peripheral vascular disease) with claudication ([place]) 06/29/2012  . Atherosclerosis of native arteries of the extremities with intermittent claudication 04/20/2012    Past Surgical History:  Procedure Laterality Date  . ABDOMINAL AORTAGRAM N/A 04/27/2012   Procedure: ABDOMINAL Maxcine Ham;  Surgeon: Conrad Bay Hill, MD;  Location: [facility] CATH LAB;  Service: Cardiovascular;  Laterality: N/A;  . ABDOMINAL AORTAGRAM N/A 08/09/2014   Procedure: ABDOMINAL Maxcine Ham;  Surgeon: Elam Dutch, MD;  Location: [facility] CATH LAB;  Service: Cardiovascular;  Laterality: N/A;  . BIOPSY  10/06/2017   Procedure: BIOPSY;  Surgeon: Daneil Dolin, MD;  Location: [facility] ENDO SUITE;  Service: Endoscopy;;  gastric   . CARDIAC CATHETERIZATION    . COLONOSCOPY    . COLONOSCOPY WITH PROPOFOL N/A 10/06/2017   Procedure: COLONOSCOPY WITH PROPOFOL;  Surgeon: Daneil Dolin, MD;  Location: [facility] ENDO SUITE;  Service: Endoscopy;  Laterality: N/A;  1:45pm - patient can't come earlier due to transportation  . ESOPHAGOGASTRODUODENOSCOPY (EGD) WITH PROPOFOL N/A 10/06/2017   Procedure: ESOPHAGOGASTRODUODENOSCOPY (EGD) WITH PROPOFOL;  Surgeon: Daneil Dolin, MD;  Location: [facility] ENDO SUITE;  Service: Endoscopy;  Laterality: N/A;  . EUS N/A 11/24/2017   Procedure: UPPER ENDOSCOPIC ULTRASOUND (EUS) RADIAL;  Surgeon: Milus Banister,  MD;  Location: [facility] ENDOSCOPY;  Service: Endoscopy;  Laterality: N/A;  . FEMORAL ARTERY - POPLITEAL ARTERY BYPASS GRAFT    . FEMORAL-POPLITEAL BYPASS GRAFT  09/26/2012   Procedure: BYPASS GRAFT FEMORAL-POPLITEAL ARTERY;  Surgeon: Elam Dutch, MD;  Location: [facility] OR;  Service: Vascular;  Laterality: Left;  using 45mm Gore-Tex Propaten Ringed Graft, Vein patch on the proximal and distal anastomosis  . FEMORAL-POPLITEAL  BYPASS GRAFT Right 08/12/2014   Procedure:  FEMORAL-POPLITEAL ARTERY BYPASS WITH SAPHENOUS VEIN GRAFT, INTRAOPERATIVE ARTERIOGRAM;  Surgeon: Elam Dutch, MD;  Location: [facility];  Service: Vascular;  Laterality: Right;  . INTRAOPERATIVE ARTERIOGRAM  08/12/2014   Procedure: INTRA OPERATIVE ARTERIOGRAM;  Surgeon: Elam Dutch, MD;  Location: [facility];  Service: Vascular;;  . POLYPECTOMY  10/06/2017   Procedure: POLYPECTOMY;  Surgeon: Daneil Dolin, MD;  Location: [facility];  Service: Endoscopy;;  splenic flexure,  ascending  . PORTACATH PLACEMENT N/A 01/24/2018   Procedure: INSERTION PORT-A-CATH;  Surgeon: [name], MD;  Location: [facility];  Service: General;  Laterality: N/A;  . PR VEIN BYPASS GRAFT,AORTO-FEM-POP    . stents in bil legs          Home Medications    Prior to Admission medications   Medication Sig Start Date End Date Taking? Authorizing Provider  amLODipine (NORVASC) 10 MG tablet TAKE ONE TABLET BY MOUTH ONCE DAILY 11/11/17  Yes Branch, Alphonse Guild, MD  aspirin EC 81 MG tablet Take 81 mg by mouth daily.   Yes [provider]  dexamethasone (DECADRON) 4 MG tablet Take 2 tablets (8 mg total) by mouth daily. Start the day after chemotherapy for 2 days. Take with food. 01/26/18  Yes Higgs, Mathis Dad, MD  DOCEtaxel (TAXOTERE IV) Inject into the vein.   Yes [provider]  fluorouracil CALGB 93903 in sodium chloride 0.9 % 150 mL Inject into the vein over 24 hr.   Yes [provider]  LEUCOVORIN CALCIUM IV Inject into the vein.   Yes [provider]  lidocaine-prilocaine (EMLA) cream Apply to affected area once 01/26/18  Yes Higgs, Mathis Dad, MD  lisinopril (PRINIVIL,ZESTRIL) 40 MG tablet Take 40 mg by mouth at bedtime.   Yes [provider]  loperamide (IMODIUM) 2 MG capsule Take 1 capsule (2 mg total) by mouth as needed for diarrhea or loose stools. 02/28/18  Yes Higgs, Mathis Dad, MD  ondansetron (ZOFRAN) 8 MG tablet Take 1 tablet (8 mg total)  by mouth 2 (two) times daily as needed for refractory nausea / vomiting. Start on day 3 after chemotherapy. 01/26/18  Yes Higgs, Mathis Dad, MD  OXALIPLATIN IV Inject into the vein.   Yes [provider]  pantoprazole (PROTONIX) 40 MG tablet TAKE 1 TABLET BY MOUTH TWICE DAILY BEFORE A MEAL 01/05/18  Yes Annitta Needs, NP  prochlorperazine (COMPAZINE) 10 MG tablet Take 1 tablet (10 mg total) by mouth every 6 (six) hours as needed (Nausea or vomiting). 01/26/18  Yes Higgs, Mathis Dad, MD  rosuvastatin (CRESTOR) 10 MG tablet Take 1 tablet (10 mg total) by mouth daily. 03/01/18  Yes Branch, Alphonse Guild, MD  valACYclovir (VALTREX) 500 MG tablet Take 500 mg by mouth at bedtime.   Yes [provider]  vitamin B-12 (CYANOCOBALAMIN) 1000 MCG tablet Take 1,000 mcg by mouth daily.   Yes [provider]    Family History Family History  Problem Relation Age of Onset  . Diabetes Mother   . Heart disease Mother  before age 55- Triple BPG  . Cancer Mother        Luekemia  . Deep vein thrombosis Mother   . Hyperlipidemia Mother   . Hypertension Mother   . Heart attack Mother   . Peripheral vascular disease Mother   . Stroke Mother        x 3  . Pancreatic cancer Mother   . Arthritis Father   . Hypertension Father   . Kidney disease Father   . Cancer Maternal Grandmother        NOS  . Diabetes Cousin   . Cancer Cousin        NOS, pat first cousin  . Cancer Cousin        NOS, pat first cousin  . Colon cancer Neg Hx   . Gastric cancer Neg Hx   . Esophageal cancer Neg Hx     Social History Social History   Tobacco Use  . Smoking status: Current Every Day Smoker    Packs/day: 1.00    Years: 25.00    Pack years: 25.00    Types: Cigars    Start date: 11/16/1974  . Smokeless tobacco: Never Used  . Tobacco comment: Black and Mild cigars  Substance Use Topics  . Alcohol use: No    Alcohol/week: 0.0 oz  . Drug use: Yes    Types: Marijuana    Comment: Last 02/08/18      Allergies   Potassium-containing compounds   Review of Systems Review of Systems  Constitutional: Positive for appetite change. Negative for fever.  HENT: Negative for congestion.   Eyes: Negative for redness.  Respiratory: Negative for shortness of breath.   Cardiovascular: Negative for chest pain and leg swelling.  Gastrointestinal: Positive for nausea. Negative for abdominal pain, blood in stool and vomiting.  Genitourinary: Negative for dysuria.  Musculoskeletal: Negative for back pain.  Neurological: Positive for syncope and light-headedness. Negative for headaches.  Hematological: Does not bruise/bleed easily.  Psychiatric/Behavioral: Negative for confusion.     Physical Exam Updated Vital Signs BP 110/82   Pulse 82   Temp 98.7 F (37.1 C) (Oral)   Resp 17   SpO2 96%   Physical Exam  Constitutional: He is oriented to person, place, and time. He appears well-developed. No distress.  Patient thin and somewhat cachectic.  HENT:  Head: Normocephalic and atraumatic.  Mouth/Throat: Oropharynx is clear and moist.  Eyes: Pupils are equal, round, and reactive to light. Conjunctivae and EOM are normal.  Neck: Normal range of motion. Neck supple.  Cardiovascular: Normal rate, regular rhythm and normal heart sounds.  Pulmonary/Chest: Effort normal and breath sounds normal. No respiratory distress.  Patient with Port-A-Cath left upper chest area.  Abdominal: Soft. Bowel sounds are normal. There is no tenderness.  Musculoskeletal: Normal range of motion. He exhibits no edema.  Neurological: He is alert and oriented to person, place, and time. No cranial nerve deficit or sensory deficit. He exhibits normal muscle tone. Coordination normal.  Skin: Skin is warm.  Nursing note and vitals reviewed.    ED Treatments / Results  Labs (all labs ordered are listed, but only abnormal results are displayed) Labs Reviewed  CBC WITH DIFFERENTIAL/PLATELET - Abnormal; Notable for  the following components:      Result Value   WBC 20.3 (*)    RBC 3.60 (*)    Hemoglobin 10.7 (*)    HCT 31.8 (*)    RDW 16.5 (*)    Platelets 128 (*)  Neutro Abs 16.8 (*)    All other components within normal limits  COMPREHENSIVE METABOLIC PANEL - Abnormal; Notable for the following components:   Glucose, Bld 106 (*)    BUN 34 (*)    Total Protein 6.4 (*)    All other components within normal limits  LIPASE, BLOOD - Abnormal; Notable for the following components:   Lipase 174 (*)    All other components within normal limits  CBG MONITORING, ED - Abnormal; Notable for the following components:   Glucose-Capillary 103 (*)    All other components within normal limits  CULTURE, BLOOD (ROUTINE X 2)  CULTURE, BLOOD (ROUTINE X 2)  LACTIC ACID, PLASMA  TROPONIN I    EKG None  ED ECG REPORT   Date: 03/17/2018  Rate: 85  Rhythm: normal sinus rhythm  QRS Axis: normal  Intervals: normal  ST/T Wave abnormalities: normal  Conduction Disutrbances:none  Narrative Interpretation:   Old EKG Reviewed: none available Septal infarct age undetermined.  Minimal voltage criteria for left ventricular hypertrophy. I have personally reviewed the EKG tracing and agree with the computerized printout as noted.     Radiology Dg Chest 2 View  Result Date: 03/17/2018 CLINICAL DATA:  NEAR SYNCOPE, PER ER NOTE, Pt states was sitting out side smoking weed this am and started having dizziness. Also states has had chest discomfort since yesterday. Pt reports he has stomach cancerHISTORY OF HTN, STROKE, CAD, CANCER, CKD EXAM: CHEST - 2 VIEW COMPARISON:  01/24/2018 FINDINGS: Cardiac silhouette is normal size. No mediastinal or hilar masses. No convincing adenopathy. Clear lungs.  No pleural effusion or pneumothorax. Left anterior chest wall Port-A-Cath is stable. Skeletal structures are intact. IMPRESSION: No active cardiopulmonary disease. Electronically Signed   By: Lajean Manes M.D.   On:  03/17/2018 14:38    Procedures Procedures (including critical care time)  CRITICAL CARE Performed by: Fredia Sorrow Total critical care time: 30 minutes Critical care time was exclusive of separately billable procedures and treating other patients. Critical care was necessary to treat or prevent imminent or life-threatening deterioration. Critical care was time spent personally by me on the following activities: development of treatment plan with patient and/or surrogate as well as nursing, discussions with consultants, evaluation of patient's response to treatment, examination of patient, obtaining history from patient or surrogate, ordering and performing treatments and interventions, ordering and review of laboratory studies, ordering and review of radiographic studies, pulse oximetry and re-evaluation of patient's condition.   Medications Ordered in ED Medications  0.9 %  sodium chloride infusion (has no administration in time range)  sodium chloride 0.9 % bolus 1,000 mL (0 mLs Intravenous Stopped 03/17/18 1453)  sodium chloride 0.9 % bolus 1,000 mL (1,000 mLs Intravenous New Bag/Given 03/17/18 1354)     Initial Impression / Assessment and Plan / ED Course  I have reviewed the triage vital signs and the nursing notes.  Pertinent labs & imaging results that were available during my care of the patient were reviewed by me and considered in my medical decision making (see chart for details).    Blood pressures improved here with 2 L of fluid.  Blood pressures now in the low 245Y systolic.  As per discussion with hospitalist for admission certainly this could be secondary to his blood pressure medicine.  May not require the same amount of blood pressure medicine due to his illness and subsequent weight loss.  Still requires admission hypertensive meds can be held fluids continued until blood pressure is more  stable.  Total amount of fluids was approximately 3 L.   Final Clinical  Impressions(s) / ED Diagnoses   Final diagnoses:  Near syncope  Hypotension, unspecified hypotension type    ED Discharge Orders    None       Fredia Sorrow, MD 03/17/18 1735    Fredia Sorrow, MD 03/17/18 [phone]

## 2018-03-17 NOTE — ED Notes (Signed)
Pt had large amount of yellow colored emesis upon arrival in the room.

## 2018-03-17 NOTE — ED Notes (Signed)
Report given to Jenny RN [phone] 

## 2018-03-18 DIAGNOSIS — I952 Hypotension due to drugs: Secondary | ICD-10-CM | POA: Diagnosis not present

## 2018-03-18 DIAGNOSIS — K760 Fatty (change of) liver, not elsewhere classified: Secondary | ICD-10-CM | POA: Diagnosis not present

## 2018-03-18 DIAGNOSIS — I959 Hypotension, unspecified: Secondary | ICD-10-CM | POA: Diagnosis not present

## 2018-03-18 LAB — BASIC METABOLIC PANEL
Anion gap: 6 (ref 5–15)
BUN: 20 mg/dL (ref 6–20)
CHLORIDE: 106 mmol/L (ref 101–111)
CO2: 23 mmol/L (ref 22–32)
CREATININE: 0.79 mg/dL (ref 0.61–1.24)
Calcium: 8.3 mg/dL — ABNORMAL LOW (ref 8.9–10.3)
GFR calc non Af Amer: >60 mL/min (ref >60)
GLUCOSE: 77 mg/dL (ref 65–99)
Potassium: 4.2 mmol/L (ref 3.5–5.1)
Sodium: 135 mmol/L (ref 135–145)

## 2018-03-18 LAB — LIPASE, BLOOD: Lipase: 56 U/L — ABNORMAL HIGH (ref 11–51)

## 2018-03-18 MED ORDER — BOOST / RESOURCE BREEZE PO LIQD CUSTOM: 1.0000 | Freq: Three times a day (TID) | ORAL | Status: DC

## 2018-03-18 MED ORDER — ORAL CARE MOUTH RINSE: 15.0000 mL | Freq: Two times a day (BID) | OROMUCOSAL | Status: DC

## 2018-03-18 MED ORDER — IOPAMIDOL (ISOVUE-300) INJECTION 61%
100.0000 mL | Freq: Once | INTRAVENOUS | Status: AC | PRN
  Administered 2018-03-18: 100 mL via INTRAVENOUS

## 2018-03-18 NOTE — Progress Notes (Addendum)
Patient's IV removed and site intact.  Awaiting MD's discharge orders for patient.

## 2018-03-18 NOTE — Progress Notes (Signed)
Initial Nutrition Assessment  DOCUMENTATION CODES:   Not applicable  INTERVENTION:   - Continue Boost Breeze po TID, each supplement provides 250 kcal and 9 grams of protein  - Once diet advanced, change supplement to Ensure Enlive po TID, each supplement provides 350 kcal and 20 grams of protein  NUTRITION DIAGNOSIS:   Increased nutrient needs related to cancer and cancer related treatments as evidenced by estimated needs.  GOAL:   Patient will meet greater than or equal to 90% of their needs  MONITOR:   PO intake, Supplement acceptance, Labs, Weight trends  REASON FOR ASSESSMENT:   Malnutrition Screening Tool    ASSESSMENT:   57 year old male who presented to the ED with hypotension. Pt with diagnosis of adenocarcinoma of the stomach with recent completion of a chemotherapy course. PMH significant for CAD, CKD stage II, hypertension, and hyperlipidemia.  Noted pt being followed by [place] RD. Per [place] RD's notes, pt has experienced a decreased appetite, taste changes, and nausea since beginning chemotherapy treatment. Also noted pt with nutrition knowledge deficit regarding losing weight during chemotherapy and not eating before bed. [place] RD addressed pt's misconceptions and provided education regarding high-calorie, high-protein nutrition therapy, soft and most protein choices, and recipe for baking soda mouthwash. Pt has also been provided with 3 cases of Ensure since first session with [place] RD. Pt was diagnosed with moderate protein-calorie malnutrition but per [facility], no longer meets criteria.  As pt is currently on clear liquid diet, continue Boost Breeze TID. Recommend changing supplement to Ensure Enlive TID once diet advanced to maximize protein and calorie intake during hospitalization. No meal completion history in chart.  RD unable to speak with pt at this time due to remote assessment. RD to follow up next week to obtain a  more detailed diet history.  Per weight history in chart, pt has lost 14.5 lbs since 01/31/18. This is a 7.3% weight loss in less than 2 months which is significant for timeframe.  Medications reviewed and include: Boost Breeze TID, Protonix, IV fluids  Labs reviewed: lipase 56 (H), hemoglobin 10.7 (L), HCT 31.8 (L)  UOP: 2425 since admission  NUTRITION - FOCUSED PHYSICAL EXAM:  Unable to complete at this time due to remote assessment. RD suspects pt with moderate protein-calorie malnutrition due to significant weight loss but unable to diagnose at this time without completion of NFPE.  Diet Order:   Diet Order           Diet clear liquid Room service appropriate? Yes; Fluid consistency: Thin  Diet effective now          EDUCATION NEEDS:   No education needs have been identified at this time  Skin:  Skin Assessment: Reviewed RN Assessment  Last BM:  03/15/18  Height:   Ht Readings from Last 1 Encounters:  03/18/18 6\' 1"  (1.854 m)    Weight:   Wt Readings from Last 1 Encounters:  03/18/18 180 lb (81.6 kg)    Ideal Body Weight:  83.6 kg  BMI:  Body mass index is 23.75 kg/m.  Estimated Nutritional Needs:   Kcal:  2400-2600 kcal/day  Protein:  115-130 grams/day  Fluid:  2.4-2.6 L/day    Gaynell Face, MS, RD, LDN Pager: [phone] Weekend/After Hours: [phone]

## 2018-03-18 NOTE — Progress Notes (Addendum)
Nurse Amy entered the room to give pt D/C papers and the patient was not in the room or anywhere on hospital premises. Notified MD.  MD states he was 'already discharged anyway.'

## 2018-03-18 NOTE — Discharge Summary (Signed)
Discharge Summary  Todd [name] PIR:[id number] DOB: May 06, 1961  PCP: Lovey Newcomer, PA  Admit date: 03/17/2018 Discharge date: 03/18/2018  Time spent: Less than 30 minutes  Recommendations for Outpatient Follow-up:  1. Follow-up with his primary care doctor to monitor blood pressure and decide when to reinitiate his lisinopril  Discharge Diagnoses:  Active Hospital Problems   Diagnosis Date Noted  . Hypotension 03/17/2018  . Elevated lipase 03/17/2018  . Leukocytosis 03/17/2018  . Malignant neoplasm of stomach (HCC) 11/11/2017  . GERD (gastroesophageal reflux disease) 08/30/2017  . 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 Hypertension 07/19/2012  . Hyperlipidemia 07/19/2012    Resolved Hospital Problems  No resolved problems to display.    Discharge Condition: Improved  Diet recommendation: Cardiac  Vitals:   03/17/18 2133 03/18/18 0504  BP:  120/64  Pulse:  82  Resp:  15  Temp:  98.4 F (36.9 C)  SpO2: 99% 100%    History of present illness:    HPI: Todd [name] is a 57 y.o. male with a history of gastric adenocarcinoma, chronic kidney disease stage II, coronary artery disease, hypertension, hyperlipidemia, history of stroke in 2008 with residual right-sided weakness.  Patient seen for dizziness and lightheadedness when he was outside smoking marijuana.  As he continued to feel like he was going to pass out, the patient came to emergency department for evaluation.  Upon arrival, it was noted that his blood pressure was quite low: 70s over 40s.  Patient received 2 L IV fluids with improvement.  Due to his severe hypotension, is requested that I admit him overnight for observation.  He has been taking his medications.  He has had a 30 pound weight loss over the past 3 to 4 months.  He is receiving chemotherapy, last dose was yesterday.    Hospital Course:  Principal Problem:   Hypotension Active Problems:   Hypertension   Hyperlipidemia   CKD  (chronic kidney disease), stage II   GERD (gastroesophageal reflux disease)   Malignant neoplasm of stomach (HCC)   Elevated lipase   Leukocytosis  Patient was admitted with hypotension he is blood pressure was 70/40 His amlodipine and lisinopril 40 mg were held he was given IV hydration he is doing much better today he is very eager to be discharged back home.  Procedures:  None  Consultations:  None  Discharge Exam: BP 120/64 (BP Location: Left Arm)   Pulse 82   Temp 98.4 F (36.9 C) (Oral)   Resp 15   Ht 6\' 1"  (1.854 m)   Wt 81.6 kg (180 lb)   SpO2 100%   BMI 23.75 kg/m     General: Middle-aged Caucasian male. Awake and alert and oriented x3. No acute cardiopulmonary distress.   HEENT: Normocephalic atraumatic.  Right and left ears normal in appearance.  Pupils equal, round, reactive to light. Extraocular muscles are intact. Sclerae anicteric and noninjected.  Moist mucosal membranes. No mucosal lesions.   Neck: Neck supple without lymphadenopathy. No carotid bruits. No masses palpated.   Cardiovascular: Regular rate with normal S1-S2 sounds. No murmurs, rubs, gallops auscultated. No JVD.   Respiratory: Good respiratory effort with no wheezes, rales, rhonchi. Lungs clear to auscultation bilaterally.  No accessory muscle use.  Abdomen: Soft, nontender, nondistended. Active bowel sounds. No masses or hepatosplenomegaly   Skin: No rashes, lesions, or ulcerations.  Dry, warm to touch. 2+ dorsalis pedis and radial pulses.  Musculoskeletal: No calf or leg pain. All major  joints not erythematous nontender.  No upper or lower joint deformation.  Good ROM.  No contractures   Psychiatric: Intact judgment and insight. Pleasant and cooperative.  Neurologic: No focal neurological deficits. Strength is 5/5 and symmetric in upper and lower extremities.  Cranial nerves II through XII are grossly intact.     Discharge Instructions You were cared for by a hospitalist during  your hospital stay. If you have any questions about your discharge medications or the care you received while you were in the hospital after you are discharged, you can call the unit and asked to speak with the hospitalist on call if the hospitalist that took care of you is not available. Once you are discharged, your primary care physician will handle any further medical issues. Please note that NO REFILLS for any discharge medications will be authorized once you are discharged, as it is imperative that you return to your primary care physician (or establish a relationship with a primary care physician if you do not have one) for your aftercare needs so that they can reassess your need for medications and monitor your lab values.  Discharge Instructions    Call MD for:  persistant dizziness or light-headedness   Complete by:  As directed    Diet - low sodium heart healthy   Complete by:  As directed    Discharge instructions   Complete by:  As directed    Your primary care provider to monitor her blood pressure and restart lisinopril if needed   Increase activity slowly   Complete by:  As directed      Allergies as of 03/18/2018      Reactions   Potassium-containing Compounds Swelling   Unspecified cause of Swelling of lip after taking potassium pill      Medication List    STOP taking these medications   lisinopril 40 MG tablet Commonly known as:  PRINIVIL,ZESTRIL     TAKE these medications   amLODipine 10 MG tablet Commonly known as:  NORVASC TAKE ONE TABLET BY MOUTH ONCE DAILY   aspirin EC 81 MG tablet Take 81 mg by mouth daily.   dexamethasone 4 MG tablet Commonly known as:  DECADRON Take 2 tablets (8 mg total) by mouth daily. Start the day after chemotherapy for 2 days. Take with food.   fluorouracil CALGB 16109 in sodium chloride 0.9 % 150 mL Inject into the vein over 24 hr.   LEUCOVORIN CALCIUM IV Inject into the vein.   lidocaine-prilocaine cream Commonly known  as:  EMLA Apply to affected area once   loperamide 2 MG capsule Commonly known as:  IMODIUM Take 1 capsule (2 mg total) by mouth as needed for diarrhea or loose stools.   ondansetron 8 MG tablet Commonly known as:  ZOFRAN Take 1 tablet (8 mg total) by mouth 2 (two) times daily as needed for refractory nausea / vomiting. Start on day 3 after chemotherapy.   OXALIPLATIN IV Inject into the vein.   pantoprazole 40 MG tablet Commonly known as:  PROTONIX TAKE 1 TABLET BY MOUTH TWICE DAILY BEFORE A MEAL   prochlorperazine 10 MG tablet Commonly known as:  COMPAZINE Take 1 tablet (10 mg total) by mouth every 6 (six) hours as needed (Nausea or vomiting).   rosuvastatin 10 MG tablet Commonly known as:  CRESTOR Take 1 tablet (10 mg total) by mouth daily.   TAXOTERE IV Inject into the vein.   valACYclovir 500 MG tablet Commonly known as:  VALTREX Take  500 mg by mouth at bedtime.   vitamin B-12 1000 MCG tablet Commonly known as:  CYANOCOBALAMIN Take 1,000 mcg by mouth daily.      Allergies  Allergen Reactions  . Potassium-Containing Compounds Swelling    Unspecified cause of Swelling of lip after taking potassium pill      The results of significant diagnostics from this hospitalization (including imaging, microbiology, ancillary and laboratory) are listed below for reference.    Significant Diagnostic Studies: Dg Chest 2 View  Result Date: 03/17/2018 CLINICAL DATA:  NEAR SYNCOPE, PER ER NOTE, Pt states was sitting out side smoking weed this am and started having dizziness. Also states has had chest discomfort since yesterday. Pt reports he has stomach cancerHISTORY OF HTN, STROKE, CAD, CANCER, CKD EXAM: CHEST - 2 VIEW COMPARISON:  01/24/2018 FINDINGS: Cardiac silhouette is normal size. No mediastinal or hilar masses. No convincing adenopathy. Clear lungs.  No pleural effusion or pneumothorax. Left anterior chest wall Port-A-Cath is stable. Skeletal structures are intact.  IMPRESSION: No active cardiopulmonary disease. Electronically Signed   By: Amie Portland M.D.   On: 03/17/2018 14:38   Ct Abdomen Pelvis W Contrast  Result Date: 03/18/2018 CLINICAL DATA:  Dizziness wall smoking we this morning. Chest discomfort since yesterday. History of stomach cancer. EXAM: CT ABDOMEN AND PELVIS WITH CONTRAST TECHNIQUE: Multidetector CT imaging of the abdomen and pelvis was performed using the standard protocol following bolus administration of intravenous contrast. CONTRAST:  ISOVUE-300 IOPAMIDOL (ISOVUE-300) INJECTION 61% COMPARISON:  Head CT 10/24/2017.  CT abdomen and pelvis 08/10/2017 FINDINGS: Lower chest: Mild dependent atelectasis. Lung bases are otherwise clear. Hepatobiliary: Mild diffuse fatty infiltration of the liver. No focal lesions. Gallbladder and bile ducts are unremarkable. Pancreas: Unremarkable. No pancreatic ductal dilatation or surrounding inflammatory changes. Spleen: Normal in size without focal abnormality. Adrenals/Urinary Tract: No adrenal gland nodules. Subcentimeter cysts on the kidneys. Vascular calcifications in the kidneys. No hydronephrosis or hydroureter. No renal or ureteral stones. Bladder wall is not thickened. Stomach/Bowel: Stomach is decompressed. This limits evaluation of the gastric wall period there appears to be a focal ulceration in the inferior aspect of the lesser curvature. Gastritis with gastric ulcer may be present. This is in the general area of abnormality seen on previous PET-CT and could represent postoperative change. Small bowel and colon are not abnormally distended. No wall thickening is appreciated. Diverticula in the colon without evidence of diverticulitis. Appendix is not identified. Vascular/Lymphatic: Infrarenal abdominal aortic aneurysm measuring about 3.6 cm diameter. No change in size since previous study. There is aortic calcification with prominent mural thrombus in the aneurysm. No occlusion. Postoperative changes  in the groin regions consistent with bilateral femoral grafts, likely to the popliteal although the distal graft is not identified. Reproductive: Prostate gland is not enlarged. Other: No free air or free fluid in the abdomen. Abdominal wall musculature appears intact. 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 Degenerative changes in the spine. No destructive bone lesions. IMPRESSION: 1. Decompression of the stomach limits evaluation of the gastric wall but there is suggestion of a gastric ulcer. This could also represent postoperative change. 2. No bowel obstruction or inflammation. 3. Fatty infiltration of the liver. 4. Unchanged appearance of 3.6 cm abdominal aortic aneurysm. Extensive aortic atherosclerotic calcification. 5. Bilateral femoral grafts, likely to the popliteals. 6. 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 Electronically Signed   By: Burman Nieves M.D.   On: 03/18/2018 01:52    Microbiology: Recent Results (from the past 240 hour(s))  Culture, blood (Routine X 2) w Reflex to ID Panel     Status: None (Preliminary result)   Collection Time: 03/17/18  1:41 PM  Result Value Ref Range Status   Specimen Description BLOOD LEFT ARM  Final   Special Requests   Final    BOTTLES DRAWN AEROBIC AND ANAEROBIC Blood Culture adequate volume   Culture   Final    NO GROWTH < 24 HOURS Performed at [facility], [address] [place], [place] [address]    Report Status PENDING  Incomplete  Culture, blood (Routine X 2) w Reflex to ID Panel     Status: None (Preliminary result)   Collection Time: 03/17/18  1:41 PM  Result Value Ref Range Status   Specimen Description BLOOD RIGHT ARM  Final   Special Requests   Final    BOTTLES DRAWN AEROBIC AND ANAEROBIC Blood Culture adequate volume   Culture   Final    NO GROWTH < 24 HOURS Performed at [facility], [address], [place], [place] [address]    Report Status PENDING  Incomplete      Labs: Basic Metabolic Panel: Recent Labs  Lab 03/14/18 1003 03/17/18 1320 03/18/18 0608  NA 139 136 135  K 3.9 4.4 4.2  CL 104 104 106  CO2 27 24 23   GLUCOSE 170* 106* 77  BUN 12 34* 20  CREATININE 1.08 1.23 0.79  CALCIUM 9.1 9.2 8.3*   Liver Function Tests: Recent Labs  Lab 03/14/18 1003 03/17/18 1320  AST 20 37  ALT 18 42  ALKPHOS 101 86  BILITOT 0.3 0.4  PROT 6.4* 6.4*  ALBUMIN 3.4* 3.5   Recent Labs  Lab 03/17/18 1320 03/18/18 0608  LIPASE 174* 56*   No results for input(s): AMMONIA in the last 168 hours. CBC: Recent Labs  Lab 03/14/18 1003 03/17/18 1320  WBC 26.2* 20.3*  NEUTROABS 21.6 16.8*  HGB 11.0* 10.7*  HCT 34.3* 31.8*  MCV 90.3 88.3  PLT 105* 128*   Cardiac Enzymes: Recent Labs  Lab 03/17/18 1524  TROPONINI <0.03   BNP: BNP (last 3 results) No results for input(s): BNP in the last 8760 hours.  ProBNP (last 3 results) No results for input(s): PROBNP in the last 8760 hours.  CBG: Recent Labs  Lab 03/17/18 1318  GLUCAP 103*       Signed:  Myrtie Neither, MD [facility] 03/18/2018, 1:42 PM

## 2018-03-18 NOTE — Progress Notes (Addendum)
Called and spoke with patient and told him that the MD wanted him to stop taking his lisinopril since this was the only medication change on his discharge paper work. The patient verbalized understanding of this and also said he "had to leave", and "the doctor was taking way to long", and "there is nothing wrong with me now."

## 2018-03-20 ENCOUNTER — Encounter (HOSPITAL_COMMUNITY)
Admission: RE | Admit: 2018-03-20 | Discharge: 2018-03-20 | Disposition: A | Discharge status: Home / Self Care | Payer: Medicare Other | Source: Ambulatory Visit | Attending: Internal Medicine | Admitting: Internal Medicine

## 2018-03-20 DIAGNOSIS — C169 Malignant neoplasm of stomach, unspecified: Secondary | ICD-10-CM | POA: Diagnosis not present

## 2018-03-20 MED ORDER — FLUDEOXYGLUCOSE F - 18 (FDG) INJECTION
10.0000 | Freq: Once | INTRAVENOUS | Status: AC | PRN
  Administered 2018-03-20: 10.1 via INTRAVENOUS

## 2018-03-21 ENCOUNTER — Encounter (HOSPITAL_COMMUNITY): Payer: Self-pay | Admitting: Lab

## 2018-03-21 ENCOUNTER — Ambulatory Visit (INDEPENDENT_AMBULATORY_CARE_PROVIDER_SITE_OTHER): Payer: Medicare Other | Admitting: Podiatry

## 2018-03-21 ENCOUNTER — Ambulatory Visit (HOSPITAL_COMMUNITY): Payer: Self-pay | Admitting: Internal Medicine

## 2018-03-21 ENCOUNTER — Encounter: Payer: Self-pay | Admitting: Podiatry

## 2018-03-21 ENCOUNTER — Ambulatory Visit (INDEPENDENT_AMBULATORY_CARE_PROVIDER_SITE_OTHER): Payer: Medicare Other

## 2018-03-21 DIAGNOSIS — M779 Enthesopathy, unspecified: Secondary | ICD-10-CM | POA: Diagnosis not present

## 2018-03-21 DIAGNOSIS — M722 Plantar fascial fibromatosis: Secondary | ICD-10-CM

## 2018-03-21 NOTE — Progress Notes (Unsigned)
Referral sent to Dr Barry Dienes.  They will contact pt.

## 2018-03-21 NOTE — Progress Notes (Deleted)
  Pt no-showed for appointment.  Pet scan done 03/20/2018 shows  IMPRESSION: 1. No evidence of residual gastric carcinoma. 2. No evidence of carcinoma metastasis in the chest, abdomen, or pelvis. 3. Coronary artery calcification and Aortic Atherosclerosis (ICD10-I70.0).  I have notified staff to set pt up with Dr. Barry Dienes to discuss surgical options for gastric cancer.  He had a good response to Flot Therapy.  He will follow-up once surgery evaluation completed.        Zoila Shutter MD

## 2018-03-22 LAB — CULTURE, BLOOD (ROUTINE X 2)
Culture: NO GROWTH
Culture: NO GROWTH
SPECIAL REQUESTS: ADEQUATE
Special Requests: ADEQUATE

## 2018-03-22 NOTE — Progress Notes (Signed)
He presents today chief complaint of pain to the plantar medial heels and the lateral aspect of the bilateral foot with burning sensations.  Denies any trauma states that his fever doing pretty good and have started to hurt again after 1 year.  Objective: Vital signs are stable alert and oriented x3.  Pulses are palpable.  Neurologic sensorium is intact deep tendon reflexes are intact muscle strength is normal symmetrical bilateral.  Orthopedic evaluation demonstrates all joints distal to ankle full range of motion without crepitation mild pes planus is noted to flexible bilateral.  He has pain on palpation medial calcaneal tubercles bilateral.  Some tenderness on palpation of the fourth and fifth tarsometatarsal joints.  Tenderness evaluation demonstrates no open lesions or wounds.  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  Plan: Discussed etiology pathology conservative versus surgical therapies.  At this point we injected bilateral heels after 20 mg of Kenalog and 5 mg of Marcaine.  After sterile Betadine skin prep.  Tolerated procedure well without complications follow-up with him on as-needed basis.

## 2018-04-05 ENCOUNTER — Emergency Department (HOSPITAL_COMMUNITY): Payer: Medicare Other

## 2018-04-05 ENCOUNTER — Emergency Department (HOSPITAL_COMMUNITY)
Admission: EM | Admit: 2018-04-05 | Discharge: 2018-04-05 | Disposition: A | Discharge status: Home / Self Care | Payer: Medicare Other | Attending: Emergency Medicine | Admitting: Emergency Medicine

## 2018-04-05 ENCOUNTER — Other Ambulatory Visit: Payer: Self-pay

## 2018-04-05 ENCOUNTER — Encounter (HOSPITAL_COMMUNITY): Payer: Self-pay | Admitting: Emergency Medicine

## 2018-04-05 ENCOUNTER — Telehealth (HOSPITAL_COMMUNITY): Payer: Self-pay

## 2018-04-05 DIAGNOSIS — C169 Malignant neoplasm of stomach, unspecified: Secondary | ICD-10-CM | POA: Diagnosis not present

## 2018-04-05 DIAGNOSIS — F17210 Nicotine dependence, cigarettes, uncomplicated: Secondary | ICD-10-CM | POA: Diagnosis not present

## 2018-04-05 DIAGNOSIS — N182 Chronic kidney disease, stage 2 (mild): Secondary | ICD-10-CM | POA: Insufficient documentation

## 2018-04-05 DIAGNOSIS — I2510 Atherosclerotic heart disease of native coronary artery without angina pectoris: Secondary | ICD-10-CM | POA: Diagnosis not present

## 2018-04-05 DIAGNOSIS — Z7982 Long term (current) use of aspirin: Secondary | ICD-10-CM | POA: Diagnosis not present

## 2018-04-05 DIAGNOSIS — R1013 Epigastric pain: Secondary | ICD-10-CM | POA: Diagnosis not present

## 2018-04-05 DIAGNOSIS — Z8673 Personal history of transient ischemic attack (TIA), and cerebral infarction without residual deficits: Secondary | ICD-10-CM | POA: Insufficient documentation

## 2018-04-05 DIAGNOSIS — R197 Diarrhea, unspecified: Secondary | ICD-10-CM | POA: Diagnosis not present

## 2018-04-05 DIAGNOSIS — Z85028 Personal history of other malignant neoplasm of stomach: Secondary | ICD-10-CM | POA: Insufficient documentation

## 2018-04-05 DIAGNOSIS — R9431 Abnormal electrocardiogram [ECG] [EKG]: Secondary | ICD-10-CM | POA: Diagnosis not present

## 2018-04-05 DIAGNOSIS — I129 Hypertensive chronic kidney disease with stage 1 through stage 4 chronic kidney disease, or unspecified chronic kidney disease: Secondary | ICD-10-CM | POA: Diagnosis not present

## 2018-04-05 DIAGNOSIS — R0602 Shortness of breath: Secondary | ICD-10-CM | POA: Diagnosis not present

## 2018-04-05 DIAGNOSIS — Z79899 Other long term (current) drug therapy: Secondary | ICD-10-CM | POA: Insufficient documentation

## 2018-04-05 HISTORY — DX: Plantar fascial fibromatosis: M72.2

## 2018-04-05 LAB — URINALYSIS, ROUTINE W REFLEX MICROSCOPIC
BACTERIA UA: NONE SEEN
BILIRUBIN URINE: NEGATIVE
Glucose, UA: NEGATIVE mg/dL
KETONES UR: NEGATIVE mg/dL
LEUKOCYTES UA: NEGATIVE
Nitrite: NEGATIVE
PH: 5 (ref 5.0–8.0)
Protein, ur: NEGATIVE mg/dL
Specific Gravity, Urine: >1.046 — ABNORMAL HIGH (ref 1.005–1.030)

## 2018-04-05 LAB — BASIC METABOLIC PANEL
ANION GAP: 7 (ref 5–15)
BUN: 11 mg/dL (ref 6–20)
CALCIUM: 8.9 mg/dL (ref 8.9–10.3)
CO2: 24 mmol/L (ref 22–32)
Chloride: 101 mmol/L (ref 98–111)
Creatinine, Ser: 0.97 mg/dL (ref 0.61–1.24)
GLUCOSE: 86 mg/dL (ref 70–99)
POTASSIUM: 4 mmol/L (ref 3.5–5.1)
Sodium: 132 mmol/L — ABNORMAL LOW (ref 135–145)

## 2018-04-05 LAB — CBC WITH DIFFERENTIAL/PLATELET
BASOS PCT: 0 %
Basophils Absolute: 0 10*3/uL (ref 0.0–0.1)
Eosinophils Absolute: 0.1 10*3/uL (ref 0.0–0.7)
Eosinophils Relative: 1 %
HEMATOCRIT: 32.5 % — AB (ref 39.0–52.0)
HEMOGLOBIN: 10.4 g/dL — AB (ref 13.0–17.0)
Lymphocytes Relative: 21 %
Lymphs Abs: 3.1 10*3/uL (ref 0.7–4.0)
MCH: 29.1 pg (ref 26.0–34.0)
MCHC: 32 g/dL (ref 30.0–36.0)
MCV: 91 fL (ref 78.0–100.0)
MONOS PCT: 7 %
Monocytes Absolute: 1 10*3/uL (ref 0.1–1.0)
NEUTROS ABS: 10.9 10*3/uL — AB (ref 1.7–7.7)
NEUTROS PCT: 71 %
Platelets: 411 10*3/uL — ABNORMAL HIGH (ref 150–400)
RBC: 3.57 MIL/uL — ABNORMAL LOW (ref 4.22–5.81)
RDW: 18.3 % — ABNORMAL HIGH (ref 11.5–15.5)
WBC: 15.2 10*3/uL — ABNORMAL HIGH (ref 4.0–10.5)

## 2018-04-05 LAB — HEPATIC FUNCTION PANEL
ALT: 13 U/L (ref 0–44)
AST: 19 U/L (ref 15–41)
Albumin: 3.3 g/dL — ABNORMAL LOW (ref 3.5–5.0)
Alkaline Phosphatase: 66 U/L (ref 38–126)
BILIRUBIN TOTAL: 0.3 mg/dL (ref 0.3–1.2)
Total Protein: 6.6 g/dL (ref 6.5–8.1)

## 2018-04-05 LAB — LIPASE, BLOOD: LIPASE: 89 U/L — AB (ref 11–51)

## 2018-04-05 LAB — TROPONIN I: Troponin I: <0.03 ng/mL (ref <0.03)

## 2018-04-05 MED ORDER — IOPAMIDOL (ISOVUE-300) INJECTION 61%
100.0000 mL | Freq: Once | INTRAVENOUS | Status: AC | PRN
  Administered 2018-04-05: 100 mL via INTRAVENOUS

## 2018-04-05 MED ORDER — FENTANYL CITRATE (PF) 100 MCG/2ML IJ SOLN
50.0000 ug | INTRAMUSCULAR | Status: DC | PRN
  Administered 2018-04-05: 50 ug via INTRAVENOUS
  Filled 2018-04-05: qty 2

## 2018-04-05 MED ORDER — FAMOTIDINE IN NACL 20-0.9 MG/50ML-% IV SOLN
20.0000 mg | Freq: Once | INTRAVENOUS | Status: AC
  Administered 2018-04-05: 20 mg via INTRAVENOUS
  Filled 2018-04-05: qty 50

## 2018-04-05 MED ORDER — ONDANSETRON HCL 4 MG/2ML IJ SOLN: 4.0000 mg | INTRAMUSCULAR | Status: DC | PRN

## 2018-04-05 MED ORDER — PANTOPRAZOLE SODIUM 40 MG PO TBEC: 40.0000 mg | DELAYED_RELEASE_TABLET | Freq: Two times a day (BID) | ORAL | 0 refills | Status: DC

## 2018-04-05 MED ORDER — HYDROCODONE-ACETAMINOPHEN 5-325 MG PO TABS: ORAL_TABLET | ORAL | 0 refills | Status: DC

## 2018-04-05 NOTE — ED Provider Notes (Signed)
[facility] EMERGENCY DEPARTMENT Provider Note   CSN: [id number] Arrival date & time: 04/05/18  1821     History   Chief Complaint Chief Complaint  Patient presents with  . Abdominal Pain    HPI Todd [name] is a 57 y.o. male.   Abdominal Pain      Pt was seen at [place].  Per pt, c/o gradual onset and persistence of constant acute flair of his chronic upper abd "pain" for the past 3 days.  Has been associated with multiple intermittent episodes of diarrhea that has been ongoing for several months.  Describes the abd pain as "cramping."  Denies N/V, no fevers, no back pain, no rash, no CP/SOB, no black or blood in stools.      Past Medical History:  Diagnosis Date  . CAD (coronary artery disease)    a. Cath 07/2012 following abnormal nuclear stress test: 20-30% LAD, 20-30% OM1, occluded branch off OM2 that fills slowly via collaterals, small nondominant RCA occluded proximally, fills slowly via collaterals.  . CKD (chronic kidney disease), stage II   . Family history of cancer   . Gastric adenocarcinoma ([place])   . History of blood transfusion    "when I was a child"  . Hyperlipidemia   . Hypertension   . Peripheral vascular disease ([place])    a. bilateral superficial femoral artery stents in May/August of 2012. b. Left femoral to above-knee popliteal bypass in 08/2012.  Marland Kitchen Plantar fasciitis   . Renal insufficiency   . Stroke [facility])    2008 right side weakness  . Tobacco abuse     Patient Active Problem List   Diagnosis Date Noted  . Elevated lipase 03/17/2018  . Hypotension 03/17/2018  . Leukocytosis 03/17/2018  . Gastric carcinoma ([place]) 01/23/2018  . Genetic testing 12/20/2017  . Family history of cancer   . IBS (irritable bowel syndrome) 11/28/2017  . Malignant neoplasm of stomach ([place]) 11/11/2017  . Constipation 08/30/2017  . GERD (gastroesophageal reflux disease) 08/30/2017  . Globus sensation 08/30/2017  . PAD (peripheral artery disease) ([place]) 08/09/2014  .  CAD (coronary artery disease) 07/22/2014  . Tobacco abuse 07/22/2014  . 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 Hypertension 07/19/2012  . Hyperlipidemia 07/19/2012  . PVD (peripheral vascular disease) with claudication ([place]) 06/29/2012  . Atherosclerosis of native arteries of the extremities with intermittent claudication 04/20/2012    Past Surgical History:  Procedure Laterality Date  . ABDOMINAL AORTAGRAM N/A 04/27/2012   Procedure: ABDOMINAL Maxcine Ham;  Surgeon: Conrad Republic, MD;  Location: [facility] CATH LAB;  Service: Cardiovascular;  Laterality: N/A;  . ABDOMINAL AORTAGRAM N/A 08/09/2014   Procedure: ABDOMINAL Maxcine Ham;  Surgeon: Elam Dutch, MD;  Location: [facility] CATH LAB;  Service: Cardiovascular;  Laterality: N/A;  . BIOPSY  10/06/2017   Procedure: BIOPSY;  Surgeon: Daneil Dolin, MD;  [facility]: [facility] ENDO SU[facility];  Service: Endoscopy;;  gastric   . CARDIAC CATHETERIZATION    . COLONOSCOPY    . COLONOSCOPY WITH PROPOFOL N/A 10/06/2017   Procedure: COLONOSCOPY WITH PROPOFOL;  Surgeon: Daneil Dolin, MD;  Location: [facility] ENDO SU[facility];  Service: Endoscopy;  Laterality: N/A;  1:45pm - patient can't come earlier due to transportation  . ESOPHAGOGASTRODUODENOSCOPY (EGD) WITH PROPOFOL N/A 10/06/2017   Procedure: ESOPHAGOGASTRODUODENOSCOPY (EGD) WITH PROPOFOL;  Surgeon: Daneil Dolin, MD;  [facility];  Service: Endoscopy;  Laterality: N/A;  . EUS N/A 11/24/2017   Procedure: UPPER ENDOSCOPIC ULTRASOUND (EUS) RADIAL;  Surgeon:  Milus Banister, MD;  Location[facility];  Service: Endoscopy;  Laterality: N/A;  . FEMORAL ARTERY - POPLITEAL ARTERY BYPASS GRAFT    . FEMORAL-POPLITEAL BYPASS GRAFT  09/26/2012   Procedure: BYPASS GRAFT FEMORAL-POPLITEAL ARTERY;  Surgeon: Elam Dutch, MD;  Location: [facility];  Service: Vascular;  Laterality: Left;  using 56mm Gore-Tex Propaten Ringed Graft, Vein patch on the proximal and distal anastomosis  . FEMORAL-POPLITEAL BYPASS GRAFT Right  08/12/2014   Procedure:  FEMORAL-POPLITEAL ARTERY BYPASS WITH SAPHENOUS VEIN GRAFT, INTRAOPERATIVE ARTERIOGRAM;  Surgeon: Elam Dutch, MD;  Location: [place];  Service: Vascular;  Laterality: Right;  . INTRAOPERATIVE ARTERIOGRAM  08/12/2014   Procedure: INTRA OPERATIVE ARTERIOGRAM;  Surgeon: Elam Dutch, MD;  Location: [place];  Service: Vascular;;  . POLYPECTOMY  10/06/2017   Procedure: POLYPECTOMY;  Surgeon: Daneil Dolin, MD;  [facility];  Service: Endoscopy;;  splenic flexure,  ascending  . PORTACATH PLACEMENT N/A 01/24/2018   Procedure: INSERTION PORT-A-CATH;  Surgeon: [name], MD;  Location: [place];  Service: General;  Laterality: N/A;  . PR VEIN BYPASS GRAFT,AORTO-FEM-POP    . stents in bil legs          Home Medications    Prior to Admission medications   Medication Sig Start Date End Date Taking? Authorizing Provider  amLODipine (NORVASC) 10 MG tablet TAKE ONE TABLET BY MOUTH ONCE DAILY 11/11/17   Arnoldo Lenis, MD  aspirin EC 81 MG tablet Take 81 mg by mouth daily.    [provider]  dexamethasone (DECADRON) 4 MG tablet Take 2 tablets (8 mg total) by mouth daily. Start the day after chemotherapy for 2 days. Take with food. 01/26/18   Higgs, Mathis Dad, MD  DOCEtaxel (TAXOTERE IV) Inject into the vein.    [provider]  fluorouracil CALGB 56812 in sodium chloride 0.9 % 150 mL Inject into the vein over 24 hr.    [provider]  LEUCOVORIN CALCIUM IV Inject into the vein.    [provider]  lidocaine-prilocaine (EMLA) cream Apply to affected area once 01/26/18   Higgs, Mathis Dad, MD  loperamide (IMODIUM) 2 MG capsule Take 1 capsule (2 mg total) by mouth as needed for diarrhea or loose stools. 02/28/18   Higgs, Mathis Dad, MD  ondansetron (ZOFRAN) 8 MG tablet Take 1 tablet (8 mg total) by mouth 2 (two) times daily as needed for refractory nausea / vomiting. Start on day 3 after chemotherapy. 01/26/18   Higgs, Mathis Dad, MD  OXALIPLATIN IV  Inject into the vein.    [provider]  pantoprazole (PROTONIX) 40 MG tablet TAKE 1 TABLET BY MOUTH TWICE DAILY BEFORE A MEAL 01/05/18   Annitta Needs, NP  prochlorperazine (COMPAZINE) 10 MG tablet Take 1 tablet (10 mg total) by mouth every 6 (six) hours as needed (Nausea or vomiting). 01/26/18   Higgs, Mathis Dad, MD  rosuvastatin (CRESTOR) 10 MG tablet Take 1 tablet (10 mg total) by mouth daily. 03/01/18   Arnoldo Lenis, MD  valACYclovir (VALTREX) 500 MG tablet Take 500 mg by mouth at bedtime.    [provider]  vitamin B-12 (CYANOCOBALAMIN) 1000 MCG tablet Take 1,000 mcg by mouth daily.    [provider]    Family History Family History  Problem Relation Age of Onset  . Diabetes Mother   . Heart disease Mother        before age 53- Triple BPG  . Cancer Mother  Luekemia  . Deep vein thrombosis Mother   . Hyperlipidemia Mother   . Hypertension Mother   . Heart attack Mother   . Peripheral vascular disease Mother   . Stroke Mother        x 3  . Pancreatic cancer Mother   . Arthritis Father   . Hypertension Father   . Kidney disease Father   . Cancer Maternal Grandmother        NOS  . Diabetes Cousin   . Cancer Cousin        NOS, pat first cousin  . Cancer Cousin        NOS, pat first cousin  . Colon cancer Neg Hx   . Gastric cancer Neg Hx   . Esophageal cancer Neg Hx     Social History Social History   Tobacco Use  . Smoking status: Current Every Day Smoker    Packs/day: 1.00    Years: 25.00    Pack years: 25.00    Types: Cigars    Start date: 11/16/1974  . Smokeless tobacco: Never Used  . Tobacco comment: Black and Mild cigars  Substance Use Topics  . Alcohol use: No    Alcohol/week: 0.0 oz  . Drug use: Yes    Types: Marijuana    Comment: Last 02/08/18     Allergies   Potassium-containing compounds   Review of Systems Review of Systems  Gastrointestinal: Positive for abdominal pain.  ROS: Statement: All systems negative  except as marked or noted in the HPI; Constitutional: Negative for fever and chills. ; ; Eyes: Negative for eye pain, redness and discharge. ; ; ENMT: Negative for ear pain, hoarseness, nasal congestion, sinus pressure and sore throat. ; ; Cardiovascular: Negative for chest pain, palpitations, diaphoresis, dyspnea and peripheral edema. ; ; Respiratory: Negative for cough, wheezing and stridor. ; ; Gastrointestinal: +diarrhea, abd pain. Negative for nausea, vomiting, blood in stool, hematemesis, jaundice and rectal bleeding. . ; ; Genitourinary: Negative for dysuria, flank pain and hematuria. ; ; Musculoskeletal: Negative for back pain and neck pain. Negative for swelling and trauma.; ; Skin: Negative for pruritus, rash, abrasions, blisters, bruising and skin lesion.; ; Neuro: Negative for headache, lightheadedness and neck stiffness. Negative for weakness, altered level of consciousness, altered mental status, extremity weakness, paresthesias, involuntary movement, seizure and syncope.       Physical Exam Updated Vital Signs BP (!) 170/105 (BP Location: Right Arm)   Pulse 83   Temp 98.5 F (36.9 C) (Oral)   Resp 16   Ht 6\' 1"  (1.854 m)   Wt 81.6 kg (180 lb)   SpO2 100%   BMI 23.75 kg/m   Physical Exam 1950: Physical examination:  Nursing notes reviewed; Vital signs and O2 SAT reviewed;  Constitutional: Well developed, Well nourished, Well hydrated, In no acute distress; Head:  Normocephalic, atraumatic; Eyes: EOMI, PERRL, No scleral icterus; ENMT: Mouth and pharynx normal, Mucous membranes moist; Neck: Supple, Full range of motion, No lymphadenopathy; Cardiovascular: Regular rate and rhythm, No gallop; Respiratory: Breath sounds clear & equal bilaterally, No wheezes.  Speaking full sentences with ease, Normal respiratory effort/excursion; Chest: Nontender, Movement normal; Abdomen: Soft, +mid-epigastric tenderness to palp. No rebound or guarding. Nondistended, Normal bowel sounds; Genitourinary:  No CVA tenderness; Extremities: Peripheral pulses normal, No tenderness, No edema, No calf edema or asymmetry.; Neuro: AA&Ox3, Major CN grossly intact.  Speech clear. No gross focal motor or sensory deficits in extremities.; Skin: Color normal, Warm, Dry.   ED Treatments /  Results  Labs (all labs ordered are listed, but only abnormal results are displayed)   EKG EKG Interpretation  Date/Time:  Wednesday April 05 2018 20:28:55 EDT Ventricular Rate:  85 PR Interval:    QRS Duration: 76 QT Interval:  364 QTC Calculation: 433 R Axis:   60 Text Interpretation:  Sinus rhythm Borderline T wave abnormalities Baseline wander When compared with ECG of 03/17/2018 No significant change was found Confirmed by Francine Graven [phone]) on 04/05/2018 8:39:15 PM   Radiology   Procedures Procedures (including critical care time)  Medications Ordered in ED Medications  famotidine (PEPCID) IVPB 20 mg premix (has no administration in time range)  fentaNYL (SUBLIMAZE) injection 50 mcg (has no administration in time range)  ondansetron (ZOFRAN) injection 4 mg (has no administration in time range)     Initial Impression / Assessment and Plan / ED Course  I have reviewed the triage vital signs and the nursing notes.  Pertinent labs & imaging results that were available during my care of the patient were reviewed by me and considered in my medical decision making (see chart for details).  MDM Reviewed: previous chart, nursing note and vitals Reviewed previous: labs and ECG Interpretation: labs, ECG, x-ray and CT scan   Results for orders placed or performed during the hospital encounter of 04/05/18  CBC with Differential  Result Value Ref Range   WBC 15.2 (H) 4.0 - 10.5 K/uL   RBC 3.57 (L) 4.22 - 5.81 MIL/uL   Hemoglobin 10.4 (L) 13.0 - 17.0 g/dL   HCT 32.5 (L) 39.0 - 52.0 %   MCV 91.0 78.0 - 100.0 fL   MCH 29.1 26.0 - 34.0 pg   MCHC 32.0 30.0 - 36.0 g/dL   RDW 18.3 (H) 11.5 - 15.5 %    Platelets 411 (H) 150 - 400 K/uL   Neutrophils Relative % 71 %   Neutro Abs 10.9 (H) 1.7 - 7.7 K/uL   Lymphocytes Relative 21 %   Lymphs Abs 3.1 0.7 - 4.0 K/uL   Monocytes Relative 7 %   Monocytes Absolute 1.0 0.1 - 1.0 K/uL   Eosinophils Relative 1 %   Eosinophils Absolute 0.1 0.0 - 0.7 K/uL   Basophils Relative 0 %   Basophils Absolute 0.0 0.0 - 0.1 K/uL  Basic metabolic panel  Result Value Ref Range   Sodium 132 (L) 135 - 145 mmol/L   Potassium 4.0 3.5 - 5.1 mmol/L   Chloride 101 98 - 111 mmol/L   CO2 24 22 - 32 mmol/L   Glucose, Bld 86 70 - 99 mg/dL   BUN 11 6 - 20 mg/dL   Creatinine, Ser 0.97 0.61 - 1.24 mg/dL   Calcium 8.9 8.9 - 10.3 mg/dL   GFR calc non Af Amer >60 >60 mL/min   GFR calc Af Amer >60 >60 mL/min   Anion gap 7 5 - 15  Hepatic function panel  Result Value Ref Range   Total Protein 6.6 6.5 - 8.1 g/dL   Albumin 3.3 (L) 3.5 - 5.0 g/dL   AST 19 15 - 41 U/L   ALT 13 0 - 44 U/L   Alkaline Phosphatase 66 38 - 126 U/L   Total Bilirubin 0.3 0.3 - 1.2 mg/dL   Bilirubin, Direct <0.1 0.0 - 0.2 mg/dL   Indirect Bilirubin NOT CALCULATED 0.3 - 0.9 mg/dL  Lipase, blood  Result Value Ref Range   Lipase 89 (H) 11 - 51 U/L  Troponin I  Result Value Ref Range  Troponin I <0.03 <0.03 ng/mL  Urinalysis, Routine w reflex microscopic  Result Value Ref Range   Color, Urine YELLOW YELLOW   APPearance CLEAR CLEAR   Specific Gravity, Urine >1.046 (H) 1.005 - 1.030   pH 5.0 5.0 - 8.0   Glucose, UA NEGATIVE NEGATIVE mg/dL   Hgb urine dipstick MODERATE (A) NEGATIVE   Bilirubin Urine NEGATIVE NEGATIVE   Ketones, ur NEGATIVE NEGATIVE mg/dL   Protein, ur NEGATIVE NEGATIVE mg/dL   Nitrite NEGATIVE NEGATIVE   Leukocytes, UA NEGATIVE NEGATIVE   RBC / HPF 0-5 0 - 5 RBC/hpf   WBC, UA 0-5 0 - 5 WBC/hpf   Bacteria, UA NONE SEEN NONE SEEN   Squamous Epithelial / LPF 0-5 0 - 5   Mucus PRESENT    Dg Chest 2 View Result Date: 04/05/2018 CLINICAL DATA:  Abdominal pain and diarrhea.   Shortness of breath. EXAM: CHEST - 2 VIEW COMPARISON:  Radiograph 03/17/2018 FINDINGS: Left chest port remains in place with tip in the proximal SVC. Unchanged heart size and mediastinal contours. No pulmonary edema, focal consolidation, pleural effusion or pneumothorax. No acute osseous abnormalities. IMPRESSION: No acute findings.  Left chest port in place. Electronically Signed   By: Jeb Levering M.D.   On: 04/05/2018 21:30    Ct Abdomen Pelvis W Contrast Result Date: 04/05/2018 CLINICAL DATA:  Gastric cancer with abdominal pain EXAM: CT ABDOMEN AND PELVIS WITH CONTRAST TECHNIQUE: Multidetector CT imaging of the abdomen and pelvis was performed using the standard protocol following bolus administration of intravenous contrast. CONTRAST:  170mL ISOVUE-300 IOPAMIDOL (ISOVUE-300) INJECTION 61% COMPARISON:  03/18/2018 FINDINGS: Lower chest:  No acute or nodular finding.  Mild emphysema. Hepatobiliary: Small cystic density in the right liver seen since 2012.no evidence of biliary obstruction or stone. Pancreas: Unremarkable. Spleen: Unremarkable. Adrenals/Urinary Tract: Negative adrenals. No hydronephrosis or stone. Bilateral renal cortical cystic densities. Unremarkable bladder. Stomach/Bowel: Known gastric carcinoma with recent PET CT. Redemonstrated ulcer along the posterior wall. No perforation noted. No bowel obstruction or appendicitis. Colonic diverticulosis. Vascular/Lymphatic: Advanced atherosclerosis. Fusiform abdominal aortic aneurysm measuring 3.7 cm on coronal reformats. Occlusion of left SFA and profunda. Occlusion of the stented right common femoral branch. Reproductive:Negative. Other: No ascites or pneumoperitoneum. 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 IMPRESSION: 1. No acute finding.  Stable from 03/18/2018. 2. History of gastric adenocarcinoma with non perforated ulcer. 3. Extensive atherosclerosis with infrarenal aortic aneurysm.  Recommend followup by ultrasound in 2 years. This recommendation follows ACR consensus guidelines: White Paper of the ACR Incidental Findings Committee II on Vascular Findings. J Am Coll Radiol 2013; 10:789-794. Electronically Signed   By: Monte Fantasia M.D.   On: 04/05/2018 21:37   Results for EMBER, GOTTWALD (MRN [id number]) as of 04/05/2018 23:04  Ref. Range 05/09/2017 10:18 08/10/2017 09:12 03/17/2018 13:20 03/18/2018 06:08 04/05/2018 18:44  Lipase Latest Ref Range: 11 - 51 U/L 40 33 174 (H) 56 (H) 89 (H)    2300:  Lipase mildly elevated but CT scan reassuring. CT scan d/w Rads MD:  CT scan today similar to previous CT scan and PET scan, no new findings. Pt has tol PO well while in the ED without N/V.  No stooling while in the ED.  Abd benign, VSS. Pt states he feels better and wants to go home now. Tx symptomatically, f/u Onc MD and General Surgeon. Dx and testing d/w pt and family.  Questions answered.  Verb understanding, agreeable to d/c home with outpt f/u.  Final Clinical Impressions(s) / ED Diagnoses   Final diagnoses:  None    ED Discharge Orders    None       Francine Graven, DO 04/07/18 2348

## 2018-04-05 NOTE — Telephone Encounter (Signed)
Called patient per Dr. Walden [name] request to be sure he had an appt with Dr. Barry [name]. Patient had missed his last appt on 6/25 with Dr. Walden [name]. Patient states he sees Dr. Barry [name] on 7/17 at 0945. Encouraged patient to keep his appt. He states he will.

## 2018-04-05 NOTE — ED Notes (Signed)
Pt was informed that we need a urine sample. Pt states that he can not urinate at this time. 

## 2018-04-05 NOTE — Discharge Instructions (Addendum)
Eat a bland diet, avoiding greasy, fatty, fried foods, as well as spicy and acidic foods or beverages.  Avoid eating within 2 to 3 hours before going to bed or laying down.  Also avoid teas, colas, coffee, chocolate, pepermint and spearment. Take the prescriptions as directed. Continue to take your usual prescriptions as previously directed. Call your regular medical doctor tomorrow to schedule a follow up appointment in the next 2 to 3 days. Call the General Surgeon tomorrow to confirm your previously scheduled appointment for next week.  Return to the Emergency Department immediately if worsening.

## 2018-04-05 NOTE — ED Triage Notes (Signed)
Pt has "stomach cancer". Pt states has been hurting to epigastric area and lower abd area since diagnosed but had gotten much worse in past 2 days. N/v/d intermittent per pt. Last vomited 4 days ago. One episode of diarrhea today. nad at this time

## 2018-04-14 ENCOUNTER — Other Ambulatory Visit: Payer: Self-pay | Admitting: General Surgery

## 2018-04-14 DIAGNOSIS — C163 Malignant neoplasm of pyloric antrum: Secondary | ICD-10-CM | POA: Diagnosis not present

## 2018-04-20 ENCOUNTER — Inpatient Hospital Stay (HOSPITAL_COMMUNITY): Payer: Medicare Other | Attending: Internal Medicine | Admitting: Internal Medicine

## 2018-04-20 ENCOUNTER — Encounter (HOSPITAL_COMMUNITY): Payer: Self-pay | Admitting: Internal Medicine

## 2018-04-20 VITALS — BP 152/88 | HR 82 | Temp 99.0°F | Resp 14 | Wt 182.1 lb

## 2018-04-20 DIAGNOSIS — Z8673 Personal history of transient ischemic attack (TIA), and cerebral infarction without residual deficits: Secondary | ICD-10-CM | POA: Diagnosis not present

## 2018-04-20 DIAGNOSIS — Z79899 Other long term (current) drug therapy: Secondary | ICD-10-CM | POA: Insufficient documentation

## 2018-04-20 DIAGNOSIS — I10 Essential (primary) hypertension: Secondary | ICD-10-CM | POA: Diagnosis not present

## 2018-04-20 DIAGNOSIS — C169 Malignant neoplasm of stomach, unspecified: Secondary | ICD-10-CM | POA: Insufficient documentation

## 2018-04-20 DIAGNOSIS — F17290 Nicotine dependence, other tobacco product, uncomplicated: Secondary | ICD-10-CM | POA: Diagnosis not present

## 2018-04-20 DIAGNOSIS — Z9221 Personal history of antineoplastic chemotherapy: Secondary | ICD-10-CM | POA: Diagnosis not present

## 2018-04-20 DIAGNOSIS — Z7982 Long term (current) use of aspirin: Secondary | ICD-10-CM | POA: Insufficient documentation

## 2018-04-20 DIAGNOSIS — Z800 Family history of malignant neoplasm of digestive organs: Secondary | ICD-10-CM | POA: Diagnosis not present

## 2018-04-20 DIAGNOSIS — R197 Diarrhea, unspecified: Secondary | ICD-10-CM | POA: Insufficient documentation

## 2018-04-20 NOTE — Progress Notes (Signed)
Diagnosis No diagnosis found.  Staging Cancer Staging No matching staging information was found for the patient.  Assessment and Plan:  1.  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  57 y.o. male initially seen by Dr. Sherrine Maples and reported he was in his usual state of health until around Christmas 2018 when he went camping in [place].  During that trip patient apparently had acute onset of epigastric pain associated with vomiting.  He reports retching with vomiting.  Sometime during that episode patient apparently attempted to put his fingers in the back of his throat to relieve the sensation of food being lodged thinking that he may have had a fish bone that was stuck in his throat.  This culminated in vomiting with small amount of blood.  He subsequently had relief from vomiting but he continued to have difficulty swallowing and hoarseness of voice.  Imaging of the neck showed posterior pharyngeal edema without evidence of any foreign object or fishbone.  The pain subsequently improved gradually.    He underwent a colonoscopy that showed tubular adenomas.  An EGD that revealed diffusely infiltrated wall of the stomach with superficial ulcerations.  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    PET scan was done 10/24/2017 and showed IMPRESSION: 1. Small focus of hypermetabolism along the lesser curvature of the antrum of the stomach, corresponding to recently diagnosed gastric neoplasm. No associated lymphadenopathy or definite findings to suggest metastatic disease to the neck, chest, abdomen or pelvis. 2. Aortic atherosclerosis, in addition to left main and 3 vessel coronary artery disease. Please note that although the presence of coronary artery calcium documents the presence of coronary artery disease, the severity of this disease and any potential stenosis cannot be assessed on this non-gated CT  examination. Assessment for potential risk factor modification, dietary therapy or pharmacologic therapy may be warranted, if clinically indicated. 3. In addition, there is fusiform aneurysmal dilatation of the infrarenal abdominal aorta which measures up to 3.9 x 3.7 cm. Recommend followup by ultrasound in 2 years. This recommendation follows ACR consensus guidelines: White Paper of the ACR Incidental Findings Committee II on Vascular Findings. J Am Coll Radiol 2013; 10:789-794.  He Is followed by Dr. Barry Dienes of surgery.  He initially indicated he did not desire surgery or chemotherapy.  Patient remains asymptomatic other than mild diarrhea.    Pt has diffuse gastric adenocarcinoma that appears to be localized to the stomach.  Histologically he appears to have a signet ring variety with diffuse involvement.  He is being treated with neoadjuvant chemotherapy followed by surgery followed by adjuvant chemotherapy.  Previously, I have discussed with him that even if he were to be treated with surgery alone based on the diffuse nature of his cancer he was still be recommended for adjuvant therapy.    He was treated with FLOT regimen (5-FU dose at 2600 mg/m with 24 hours, oxaliplatin 85 mg/m, leucovorin 200 mg/m, and Taxotere 50 mg/m all on day 1 every 2 weeks for 4 cycles before surgery and 4 cycles following surgery.   PET scan done 10/24/2017 showed no evidence of distant disease.    EUS that was done 11/24/2017 showed:  Normal esophagus, ulcerated mass involving the lesser curvature of the stomach approximately mid body.  Mass measured 5 cm across 5 cm in length.  Mass appeared to be 7- 8 cm from the pylorus.  It was also 5 to 6 cm from  the GE junction.  It appeared to pass into and through the muscularis propria as well as the serosa making it a T4 a lesion.  Mass was 1.6 cm in maximal thickness.  No adenopathy was noted.  He was staged as a T4 a N0 by EUS.    He has undergone genetic testing  that was negative.     He has completed 4 cycles of FLOT.  Pt failed to keep his follow-up to go over PET scan but has been referred to Dr. Barry Dienes for surgical evaluation.  He is planned for surgery on 04/26/2018.    Pt comes in today reporting he has cancelled surgery and wants to wait until next year for operation.  He is also reporting port problems.    I have discussed with him Pet scan done 03/20/2018 showed IMPRESSION: 1. No evidence of residual gastric carcinoma. 2. No evidence of carcinoma metastasis in the chest, abdomen, or pelvis. 3. Coronary artery calcification and Aortic Atherosclerosis (ICD10-I70.0).  Long talk held with him today with nursing present.  I have discussed with him scan findings are due to chemotherapy and if he remains off treatment for a significant amount of time he has a significant risk for morbidity and mortality if he continues to be noncompliant with recommendations.  I have discussed with him he has a significant risk for death from advanced gastric cancer if he continues to defer surgery and adjuvant chemotherapy especially until next year which is what he is requesting.    After discussion he reports he would like to defer surgery until after 04/29/2018. I have discussed with him to discuss this with Dr. Marlowe Aschoff office.  I have spoken with covering PA regarding pt's discussion but he will need to discuss if delay of surgery is possible and confirm this with their office.  He will RTC 3 weeks after surgery to go over final pathology.    2.  Port questions.  He reported he desired to have Port removed.  I discussed with him standard recommendations would be for additional chemotherapy after surgery.  He is not recommended for PORT removal currently.  Nursing has evaluated Port today in clinic with no abnormalities noted.    3.  Hypertension.  BP 152/88.   Follow-up with PCP.  4.  Smoking.  Cessation is recommended.  PET scan showed no clear lung  abnormalities.  5.  Abdominal aortic aneurysm.  This was noted on recent PET scan.  Patient was also noted to have atherosclerosis on recent PET scan.  He has been seen by vascular surgery and cardiology in the past and was on aspirin and statins.  6.  Compliance with treatment recommendations.  I have reiterated to pt risk for significant morbidity and mortality if he delays surgical evaluation until next year.  He reports he will speak with Dr. Marlowe Aschoff office to determine if he can delay surgery until after 04/29/2018.  I have discussed with him it will be up to their office regarding his request.  Will ask for social work evaluation for pt.    Greater than 35 minutes spent with more than 50% spent in counseling and coordination of care.     Current Status: He is seen today for follow-up.  He reports problems with Port and would like it removed.  He reports he would like to delay surgery until next year after speaking with family.       Malignant neoplasm of stomach ([place])   11/11/2017 Initial Diagnosis  Malignant neoplasm of stomach ([place])      12/16/2017 Genetic Testing    Negative genetic testing on the common hereditary cancer panel.  The Hereditary Gene Panel offered by [facility] includes sequencing and/or deletion duplication testing of the following 47 genes: APC, ATM, AXIN2, BARD1, BMPR1A, BRCA1, BRCA2, BRIP1, CDH1, CDK4, CDKN2A (p14ARF), CDKN2A (p16INK4a), CHEK2, CTNNA1, DICER1, EPCAM (Deletion/duplication testing only), GREM1 (promoter region deletion/duplication testing only), KIT, MEN1, MLH1, MSH2, MSH3, MSH6, MUTYH, NBN, NF1, NHTL1, PALB2, PDGFRA, PMS2, POLD1, POLE, PTEN, RAD50, RAD51C, RAD51D, SDHB, SDHC, SDHD, SMAD4, SMARCA4. STK11, TP53, TSC1, TSC2, and VHL.  The following genes were evaluated for sequence changes only: SDHA and HOXB13 c.251G>A variant only. The report date is December 16, 2017.        Gastric carcinoma ([place])   01/23/2018 Initial Diagnosis    Gastric carcinoma  ([place])      01/26/2018 -  Chemotherapy    The patient had palonosetron (ALOXI) injection 0.25 mg, 0.25 mg, Intravenous,  Once, 4 of 4 cycles Administration: 0.25 mg (01/31/2018), 0.25 mg (02/14/2018), 0.25 mg (02/28/2018), 0.25 mg (03/14/2018) pegfilgrastim (NEULASTA ONPRO KIT) injection 6 mg, 6 mg, Subcutaneous, Once, 4 of 4 cycles Administration: 6 mg (02/01/2018), 6 mg (03/01/2018) leucovorin 400 mg in dextrose 5 % 250 mL infusion, 428 mg, Intravenous,  Once, 4 of 4 cycles Administration: 400 mg (01/31/2018), 400 mg (02/14/2018), 400 mg (02/28/2018), 400 mg (03/14/2018) oxaliplatin (ELOXATIN) 180 mg in dextrose 5 % 500 mL chemo infusion, 85 mg/m2 = 180 mg, Intravenous,  Once, 4 of 4 cycles Administration: 180 mg (01/31/2018), 180 mg (02/14/2018), 180 mg (02/28/2018), 180 mg (03/14/2018) DOCEtaxel (TAXOTERE) 110 mg in sodium chloride 0.9 % 250 mL chemo infusion, 50 mg/m2 = 110 mg, Intravenous,  Once, 4 of 4 cycles Administration: 110 mg (01/31/2018), 110 mg (02/14/2018), 110 mg (02/28/2018), 110 mg (03/14/2018) fluorouracil (ADRUCIL) 5,550 mg in sodium chloride 0.9 % 139 mL chemo infusion, 2,600 mg/m2 = 5,550 mg, Intravenous, 1 Day/Dose, 4 of 4 cycles Administration: 5,550 mg (01/31/2018), 5,550 mg (02/14/2018), 5,550 mg (02/28/2018), 5,550 mg (03/14/2018)  for chemotherapy treatment.         Problem List Patient Active Problem List   Diagnosis Date Noted  . Elevated lipase [R74.8] 03/17/2018  . Hypotension [I95.9] 03/17/2018  . Leukocytosis [D72.829] 03/17/2018  . Gastric carcinoma ([place]) [C16.9] 01/23/2018  . Genetic testing [Z13.79] 12/20/2017  . Family history of cancer [Z80.9]   . IBS (irritable bowel syndrome) [K58.9] 11/28/2017  . Malignant neoplasm of stomach ([place]) [C16.9] 11/11/2017  . Constipation [K59.00] 08/30/2017  . GERD (gastroesophageal reflux disease) [K21.9] 08/30/2017  . Globus sensation [F45.8] 08/30/2017  . PAD (peripheral artery disease) ([place]) [I73.9] 08/09/2014  . CAD (coronary artery  disease) [I25.10] 07/22/2014  . Tobacco abuse [Z72.0] 07/22/2014  . 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 Hypertension [I10] 07/19/2012  . Hyperlipidemia [E78.5] 07/19/2012  . PVD (peripheral vascular disease) with claudication ([place]) [I73.9] 06/29/2012  . Atherosclerosis of native arteries of the extremities with intermittent claudication [I70.219] 04/20/2012    Past Medical History Past Medical History:  Diagnosis Date  . CAD (coronary artery disease)    a. Cath 07/2012 following abnormal nuclear stress test: 20-30% LAD, 20-30% OM1, occluded branch off OM2 that fills slowly via collaterals, small nondominant RCA occluded proximally, fills slowly via collaterals.  . CKD (chronic kidney disease), stage II   . Family history of cancer   . Gastric adenocarcinoma ([place])   . History of blood transfusion    "  when I was a child"  . Hyperlipidemia   . Hypertension   . Peripheral vascular disease ([place])    a. bilateral superficial femoral artery stents in May/August of 2012. b. Left femoral to above-knee popliteal bypass in 08/2012.  Marland Kitchen Plantar fasciitis   . Renal insufficiency   . Stroke [facility])    2008 right side weakness  . Tobacco abuse     Past Surgical History Past Surgical History:  Procedure Laterality Date  . ABDOMINAL AORTAGRAM N/A 04/27/2012   Procedure: ABDOMINAL Maxcine Ham;  Surgeon: Conrad Monon, MD;  Location: [facility] CATH LAB;  Service: Cardiovascular;  Laterality: N/A;  . ABDOMINAL AORTAGRAM N/A 08/09/2014   Procedure: ABDOMINAL Maxcine Ham;  Surgeon: Elam Dutch, MD;  Location: [facility] CATH LAB;  Service: Cardiovascular;  Laterality: N/A;  . BIOPSY  10/06/2017   Procedure: BIOPSY;  Surgeon: Daneil Dolin, MD;  Location: [facility] ENDO SUITE;  Service: Endoscopy;;  gastric   . CARDIAC CATHETERIZATION    . COLONOSCOPY    . COLONOSCOPY WITH PROPOFOL N/A 10/06/2017   Procedure: COLONOSCOPY WITH PROPOFOL;  Surgeon: Daneil Dolin, MD;  Location: [facility] ENDO SUITE;   Service: Endoscopy;  Laterality: N/A;  1:45pm - patient can't come earlier due to transportation  . ESOPHAGOGASTRODUODENOSCOPY (EGD) WITH PROPOFOL N/A 10/06/2017   Procedure: ESOPHAGOGASTRODUODENOSCOPY (EGD) WITH PROPOFOL;  Surgeon: Daneil Dolin, MD;  Location: [facility] END[facility] SUITE;  Service: Endoscopy;  Laterality: N/A;  . EUS N/A 11/24/2017   Procedure: UPPER ENDOSCOPIC ULTRASOUND (EUS) RADIAL;  Surgeon: Milus Banister, MD;  Location: [facility] ENDOSCOPY;  Service: Endoscopy;  Laterality: N/A;  . FEMORAL ARTERY - POPLITEAL ARTERY BYPASS GRAFT    . FEMORAL-POPLITEAL BYPASS GRAFT  09/26/2012   Procedure: BYPASS GRAFT FEMORAL-POPLITEAL ARTERY;  Surgeon: Elam Dutch, MD;  Location[facility] OR;  Service: Vascular;  Laterality: Left;  using 66m Gore-Tex Propaten Ringed Graft, Vein patch on the proximal and distal anastomosis  . FEMORAL-POPLITEAL BYPASS GRAFT Right 08/12/2014   Procedure:  FEMORAL-POPLITEAL ARTERY BYPASS WITH SAPHENOUS VEIN GRAFT, INTRAOPERATIVE ARTERIOGRAM;  Surgeon: CElam Dutch MD;  Location[place]  Service: Vascular;  Laterality: Right;  . INTRAOPERATIVE ARTERIOGRAM  08/12/2014   Procedure: INTRA OPERATIVE ARTERIOGRAM;  Surgeon: CElam Dutch MD;  [place] [facility]  Service: Vascular;;  . POLYPECTOMY  10/06/2017   Procedure: POLYPECTOMY;  Surgeon: RDaneil Dolin MD;  [facility];  Service: Endoscopy;;  splenic flexure,  ascending  . PORTACATH PLACEMENT N/A 01/24/2018   Procedure: INSERTION PORT-A-CATH;  Surgeon: BStark Klein MD;  [place]  Service: General;  Laterality: N/A;  . PR VEIN BYPASS GRAFT,AORTO-FEM-POP    . stents in bil legs      Family History Family History  Problem Relation Age of Onset  . Diabetes Mother   . Heart disease Mother        before age 55 Triple BPG  . Cancer Mother        Luekemia  . Deep vein thrombosis Mother   . Hyperlipidemia Mother   . Hypertension Mother   . Heart attack Mother   . Peripheral vascular disease Mother    . Stroke Mother        x 3  . Pancreatic cancer Mother   . Arthritis Father   . Hypertension Father   . Kidney disease Father   . Cancer Maternal Grandmother        NOS  . Diabetes Cousin   . Cancer Cousin  NOS, pat first cousin  . Cancer Cousin        NOS, pat first cousin  . Colon cancer Neg Hx   . Gastric cancer Neg Hx   . Esophageal cancer Neg Hx      Social History  reports that he has been smoking cigars.  He started smoking about 43 years ago. He has a 25.00 pack-year smoking history. He has never used smokeless tobacco. He reports that he has current or past drug history. Drug: Marijuana. He reports that he does not drink alcohol.  Medications  Current Outpatient Medications:  .  aspirin EC 81 MG tablet, Take 81 mg by mouth daily., Disp: , Rfl:  .  amLODipine (NORVASC) 10 MG tablet, TAKE ONE TABLET BY MOUTH ONCE DAILY (Patient not taking: Reported on 04/05/2018), Disp: 90 tablet, Rfl: 3 .  dexamethasone (DECADRON) 4 MG tablet, Take 2 tablets (8 mg total) by mouth daily. Start the day after chemotherapy for 2 days. Take with food. (Patient not taking: Reported on 04/05/2018), Disp: 30 tablet, Rfl: 1 .  HYDROcodone-acetaminophen (NORCO/VICODIN) 5-325 MG tablet, 1 or 2 tabs PO q6 hours prn pain (Patient not taking: Reported on 04/20/2018), Disp: 12 tablet, Rfl: 0 .  loperamide (IMODIUM) 2 MG capsule, Take 1 capsule (2 mg total) by mouth as needed for diarrhea or loose stools. (Patient not taking: Reported on 04/20/2018), Disp: 30 capsule, Rfl: 0 .  ondansetron (ZOFRAN) 8 MG tablet, Take 1 tablet (8 mg total) by mouth 2 (two) times daily as needed for refractory nausea / vomiting. Start on day 3 after chemotherapy. (Patient not taking: Reported on 04/20/2018), Disp: 30 tablet, Rfl: 1 .  OXALIPLATIN IV, Inject into the vein., Disp: , Rfl:  .  pantoprazole (PROTONIX) 40 MG tablet, TAKE 1 TABLET BY MOUTH TWICE DAILY BEFORE A MEAL (Patient not taking: Reported on 04/05/2018), Disp:  60 tablet, Rfl: 3 .  pantoprazole (PROTONIX) 40 MG tablet, Take 1 tablet (40 mg total) by mouth 2 (two) times daily before a meal. (Patient not taking: Reported on 04/20/2018), Disp: 20 tablet, Rfl: 0 .  prochlorperazine (COMPAZINE) 10 MG tablet, Take 1 tablet (10 mg total) by mouth every 6 (six) hours as needed (Nausea or vomiting). (Patient not taking: Reported on 04/20/2018), Disp: 30 tablet, Rfl: 1 .  rosuvastatin (CRESTOR) 10 MG tablet, Take 1 tablet (10 mg total) by mouth daily. (Patient not taking: Reported on 04/05/2018), Disp: 90 tablet, Rfl: 3 .  valACYclovir (VALTREX) 500 MG tablet, Take 500 mg by mouth at bedtime., Disp: , Rfl:   Allergies Potassium-containing compounds  Review of Systems Review of Systems - Oncology ROS negative   Physical Exam  Vitals Wt Readings from Last 3 Encounters:  04/20/18 182 lb 1.6 oz (82.6 kg)  04/05/18 180 lb (81.6 kg)  03/18/18 180 lb (81.6 kg)   Temp Readings from Last 3 Encounters:  04/20/18 99 F (37.2 C) (Oral)  04/05/18 98.6 F (37 C) (Oral)  03/18/18 98.4 F (36.9 C) (Oral)   BP Readings from Last 3 Encounters:  04/20/18 (!) 152/88  04/05/18 (!) 155/82  03/18/18 120/64   Pulse Readings from Last 3 Encounters:  04/20/18 82  04/05/18 67  03/18/18 82   Constitutional: Well-developed, well-nourished, and in no distress.   HENT: Head: Normocephalic and atraumatic.  Mouth/Throat: No oropharyngeal exudate. Mucosa moist. Eyes: Pupils are equal, round, and reactive to light. Conjunctivae are normal. No scleral icterus.  Neck: Normal range of motion. Neck supple. No JVD present.  Cardiovascular: Normal rate, regular rhythm and normal heart sounds.  Exam reveals no gallop and no friction rub.   No murmur heard. Pulmonary/Chest: Effort normal and breath sounds normal. No respiratory distress. No wheezes.No rales.  Abdominal: Soft. Bowel sounds are normal. No distension. There is no tenderness. There is no guarding.  Musculoskeletal:  No edema or tenderness.  Lymphadenopathy: No cervical, axillary or supraclavicular adenopathy.  Neurological: Alert and oriented to person, place, and time. No cranial nerve deficit.  Skin: Skin is warm and dry. No rash noted. No erythema. No pallor.  Psychiatric: Affect and judgment normal.   Labs No visits with results within 3 Day(s) from this visit.  Latest known visit with results is:  Admission on 04/05/2018, Discharged on 04/05/2018  Component Date Value Ref Range Status  . WBC 04/05/2018 15.2* 4.0 - 10.5 K/uL Final  . RBC 04/05/2018 3.57* 4.22 - 5.81 MIL/uL Final  . Hemoglobin 04/05/2018 10.4* 13.0 - 17.0 g/dL Final  . HCT 04/05/2018 32.5* 39.0 - 52.0 % Final  . MCV 04/05/2018 91.0  78.0 - 100.0 fL Final  . MCH 04/05/2018 29.1  26.0 - 34.0 pg Final  . MCHC 04/05/2018 32.0  30.0 - 36.0 g/dL Final  . RDW 04/05/2018 18.3* 11.5 - 15.5 % Final  . Platelets 04/05/2018 411* 150 - 400 K/uL Final  . Neutrophils Relative % 04/05/2018 71  % Final  . Neutro Abs 04/05/2018 10.9* 1.7 - 7.7 K/uL Final  . Lymphocytes Relative 04/05/2018 21  % Final  . Lymphs Abs 04/05/2018 3.1  0.7 - 4.0 K/uL Final  . Monocytes Relative 04/05/2018 7  % Final  . Monocytes Absolute 04/05/2018 1.0  0.1 - 1.0 K/uL Final  . Eosinophils Relative 04/05/2018 1  % Final  . Eosinophils Absolute 04/05/2018 0.1  0.0 - 0.7 K/uL Final  . Basophils Relative 04/05/2018 0  % Final  . Basophils Absolute 04/05/2018 0.0  0.0 - 0.1 K/uL Final   Performed at [facility], [address], [place], [place] [address]  . Sodium 04/05/2018 132* 135 - 145 mmol/L Final  . Potassium 04/05/2018 4.0  3.5 - 5.1 mmol/L Final  . Chloride 04/05/2018 101  98 - 111 mmol/L Final   Please note change in reference range.  . CO2 04/05/2018 24  22 - 32 mmol/L Final  . Glucose, Bld 04/05/2018 86  70 - 99 mg/dL Final   Please note change in reference range.  . BUN 04/05/2018 11  6 - 20 mg/dL Final   Please note change in reference range.  .  Creatinine, Ser 04/05/2018 0.97  0.61 - 1.24 mg/dL Final  . Calcium 04/05/2018 8.9  8.9 - 10.3 mg/dL Final  . GFR calc non Af Amer 04/05/2018 >60  >60 mL/min Final  . GFR calc Af Amer 04/05/2018 >60  >60 mL/min Final   Comment: (NOTE) The eGFR has been calculated using the CKD EPI equation. This calculation has not been validated in all clinical situations. eGFR's persistently <60 mL/min signify possible Chronic Kidney Disease.   Georgiann Hahn gap 04/05/2018 7  5 - 15 Final   Performed at [facility], [address], [place], [place] [address]  . Total Protein 04/05/2018 6.6  6.5 - 8.1 g/dL Final  . Albumin 04/05/2018 3.3* 3.5 - 5.0 g/dL Final  . AST 04/05/2018 19  15 - 41 U/L Final  . ALT 04/05/2018 13  0 - 44 U/L Final   Please note change in reference range.  . Alkaline Phosphatase  04/05/2018 66  38 - 126 U/L Final  . Total Bilirubin 04/05/2018 0.3  0.3 - 1.2 mg/dL Final  . Bilirubin, Direct 04/05/2018 <0.1  0.0 - 0.2 mg/dL Final   Please note change in reference range.  . Indirect Bilirubin 04/05/2018 NOT CALCULATED  0.3 - 0.9 mg/dL Final   Performed at [facility], [address], [place], [place] [address]  . Lipase 04/05/2018 89* 11 - 51 U/L Final   Performed at [facility], [address], [place], [place] [address]  . Troponin I 04/05/2018 <0.03  <0.03 ng/mL Final   Performed at [facility], [address] [place], [place] [address]  . Color, Urine 04/05/2018 YELLOW  YELLOW Final  . APPearance 04/05/2018 CLEAR  CLEAR Final  . Specific Gravity, Urine 04/05/2018 >1.046* 1.005 - 1.030 Final  . pH 04/05/2018 5.0  5.0 - 8.0 Final  . Glucose, UA 04/05/2018 NEGATIVE  NEGATIVE mg/dL Final  . Hgb urine dipstick 04/05/2018 MODERATE* NEGATIVE Final  . Bilirubin Urine 04/05/2018 NEGATIVE  NEGATIVE Final  . Ketones, ur 04/05/2018 NEGATIVE  NEGATIVE mg/dL Final  . Protein, ur 04/05/2018 NEGATIVE  NEGATIVE mg/dL Final  . Nitrite 04/05/2018 NEGATIVE  NEGATIVE Final  . Leukocytes, UA  04/05/2018 NEGATIVE  NEGATIVE Final  . RBC / HPF 04/05/2018 0-5  0 - 5 RBC/hpf Final  . WBC, UA 04/05/2018 0-5  0 - 5 WBC/hpf Final  . Bacteria, UA 04/05/2018 NONE SEEN  NONE SEEN Final  . Squamous Epithelial / LPF 04/05/2018 0-5  0 - 5 Final  . Mucus 04/05/2018 PRESENT   Final   Performed at [facility], [address], [place],  [address]     Pathology No orders of the defined types were placed in this encounter.      Zoila Shutter MD

## 2018-04-20 NOTE — Patient Instructions (Signed)
[facility] at [facility] Discharge Instructions  You saw Dr. Higgs today.   Thank you for choosing [facility] at [facility] to provide your oncology and hematology care.  To afford each patient quality time with our provider, please arrive at least 15 minutes before your scheduled appointment time.   If you have a lab appointment with the Cancer Center please come in thru the  Main Entrance and check in at the main information desk  You need to re-schedule your appointment should you arrive 10 or more minutes late.  We strive to give you quality time with our providers, and arriving late affects you and other patients whose appointments are after yours.  Also, if you no show three or more times for appointments you may be dismissed from the clinic at the providers discretion.     Again, thank you for choosing [facility].  Our hope is that these requests will decrease the amount of time that you wait before being seen by our physicians.       _____________________________________________________________  Should you have questions after your visit to [facility], please contact our office at [phone] between the hours of 8:30 a.m. and 4:30 p.m.  Voicemails left after 4:30 p.m. will not be returned until the following business day.  For prescription refill requests, have your pharmacy contact our office.       Resources For Cancer Patients and their Caregivers ? American Cancer Society: Can assist with transportation, wigs, general needs, runs Look Good Feel Better.        1-888-227-6333 ? Cancer Care: Provides financial assistance, online support groups, medication/co-pay assistance.  1-800-813-HOPE (4673) ? [facility] Assists [place] cancer patients and their families through emotional , educational and financial support.  336-427-4357 ? [place] DSS Where to apply for food  stamps, Medicaid and utility assistance. 336-342-1394 ? [facility]: Transportation to medical appointments. 336-347-2287 ? Social Security Administration: May apply for disability if have a Stage IV cancer. 336-342-7796 1-800-772-1213 ? [place] Aging, Disability and Transit Services: Assists with nutrition, care and transit needs. 336-349-2343  Cancer Center Support Programs:   > Cancer Support Group  2nd Tuesday of the month 1pm-2pm, Journey Room   > Creative Journey  3rd Tuesday of the month 1130am-1pm, Journey Room     

## 2018-04-21 NOTE — Progress Notes (Signed)
I was in the room with Dr. Walden Field when she explained to patient how serious his disease is. She explained, in terms he could understand, how horrible this type of cancer can be and that it is not anything to mess around with. She explained to patient that he should keep his appt for surgery on 7/31 but patient kept saying that he had bills to pay. He also said that his sister and father told him if he wasn't hurting then he should not have the surgery. Dr. Walden Field explained to patient he should not be listening to his family and listen to the doctors. She explained to him he has had a good response to treatment and now is the time for surgery then further chemo. She told patient his disease is under control because of the chemo but the longer he is off chemo the greater the chance it will come back. She explained to patient if he does nothing , he will have about 3-6 months to live if the disease progresses. Patient kept saying he is not hurting right now. Dr. Walden Field explained to the patient that he will start hurting if he does nothing. Patient states he wants to put surgery off until next year. Dr. Walden Field explained that he cannot wait that long. Patient stated he had bills to pay and he is not putting anyone in charge of his money. Dr. Walden Field reiterated everything she had explained to him and gave him a handout on his type of cancer. Patient stated he would be willing to have his surgery after 8/3, which is when he gets his check.

## 2018-05-01 NOTE — H&P (Signed)
Todd [name] Friendly Documented: 04/14/2018 10:04 AM Location: [facility] Patient #: [id number] DOB: [birth year] Single / Language: [place] / Race: Black or African American Male   History of Present Illness Todd [name]; 04/14/2018 2:32 PM) The patient is a 57 year old male who presents for a follow-up for Gastric cancer. 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 The patient presented with a year of worsening upper abdominal pain. He also had several episodes of hematemesis. And upper endoscopy demonstrated significant infiltrative process in the stomach, primarily at the gastric antum . Biopsies were positive for adenocarcinoma. He had some mucosa of the remainder of the stomach that appeared abnormal. He does not smoke cigarettes any longer but is still smoking cigars and pot.  He had EUS which showed a uT4aN0 tumor. I recommended neoadjuvant chemo. He discussed this a bit with oncology at [facility]. It took a while for him to decide what to do, but he did get neoadjuvant chemotherapy. He has done pretty well wtih chemo. He had recent PET scan which showed great response and no evidence of metastatic disease. He lost around 8 pounds during treatment. He states that since his last treatment, he has felt almost back to normal.   PET 03/21/18 IMPRESSION: 1. No evidence of residual gastric carcinoma. 2. No evidence of carcinoma metastasis in the chest, abdomen, or pelvis. 3. Coronary artery calcification and Aortic Atherosclerosis  EUS 11/24/2017 Findings: 1. Normal esophagus 2. Ulcerated, clearly malignant mass involving the lesser curvature of the stomach, approximately mid body. The mass is 5cm across, 5cm in length. The distal most aspect of the mass is 7-8cm from the pylorus. The proximal most aspect of the mass is 5-6cm from the GE junction. Endosonographic Finding 1. The mass above correlates with  a hypoechoic lesion that clearly passes into and through the muscularis propria layer of the gastric wall and also appears to invade the serosa (uT4a). The mass is 1.6cm in maximal thickness 2. No perigastric adenopathy. - 5cm long, 5cm across, 1.6cm deep uT4aN0 gastric adenocarcinoma involving the mid body along the lesser curvature.   EGD 10/06/2017 Mild Erosive Reflux Esophagitis. Small hiatal hernia. - Markedly abnormal appearing gastric mucosa with overlying ulceration concerning for infiltrating process.?Status post biopsy.  Pathology 10/06/2017 Diagnosis 1. Stomach, biopsy - POORLY DIFFERENTIATED ADENOCARCINOMA WITH SIGNET RING CELL FEATURES - SEE COMMENT 2. Colon, polyp(s), splenic flexure, ascending - TUBULAR ADENOMA (2 OF 2 FRAGMENTS) - NO HIGH GRADE DYSPLASIA OR MALIGNANCY IDENTIFIED Microscopic Comment 1. Todd [name] reviewed the case and agrees with the above diagnosis.  PET scan 10/24/2017 metastasis.  IMPRESSION: 1. Small focus of hypermetabolism along the lesser curvature of the antrum of the stomach, corresponding to recently diagnosed gastric neoplasm. No associated lymphadenopathy or definite findings to suggest metastatic disease to the neck, chest, abdomen or pelvis. 2. Aortic atherosclerosis, in addition to left main and 3 vessel coronary artery disease. Please note that although the presence of coronary artery calcium documents the presence of coronary artery disease, the severity of this disease and any potential stenosis cannot be assessed on this non-gated CT examination. Assessment for potential risk factor modification, dietary therapy or pharmacologic therapy may be warranted, if clinically indicated. 3. In addition, there is fusiform aneurysmal dilatation of the infrarenal abdominal aorta which measures up to 3.9 x 3.7 cm. Recommend followup by ultrasound in 2 years. This recommendation follows ACR consensus guidelines: White Paper of the ACR  Incidental  Findings Committee II on Vascular Findings. J Am Coll Radiol 2013; 10:789-794. 4. Additional incidental findings, as above.    Allergies [name]; 04/14/2018 10:05 AM) Potassium-containing Compounds  Allergies Reconciled   Medication History [name]; 04/14/2018 10:05 AM) Aspirin (81MG  Tablet, Oral) Active. Medications Reconciled    Review of Systems Todd [name]; 04/14/2018 2:32 PM) All other systems negative  Vitals [name]; 04/14/2018 10:06 AM) 04/14/2018 10:05 AM Weight: 180.5 lb Height: 73in Body Surface Area: 2.06 m Body Mass Index: 23.81 kg/m  Temp.: 98.64F(Oral)  Pulse: 86 (Regular)  BP: 136/72 (Sitting, Left Arm, Standard)       Physical Exam Todd [name]; 04/14/2018 2:33 PM) General Mental Status-Alert. General Appearance-Consistent with stated age. Hydration-Well hydrated. Voice-Normal.  Integumentary Note: minimal hair on scalp.   Head and Neck Head-normocephalic, atraumatic with no lesions or palpable masses.  Eye Sclera/Conjunctiva - Bilateral-No scleral icterus.  Chest and Lung Exam Chest and lung exam reveals -quiet, even and easy respiratory effort with no use of accessory muscles. Inspection Chest Wall - Normal. Back - normal.  Breast - Did not examine.  Cardiovascular Cardiovascular examination reveals -normal pedal pulses bilaterally. Note: regular rate and rhythm  Abdomen Inspection-Inspection Normal. Palpation/Percussion Palpation and Percussion of the abdomen reveal - Soft, Non Tender, No Rebound tenderness, No Rigidity (guarding) and No hepatosplenomegaly.  Peripheral Vascular Upper Extremity Inspection - Bilateral - Normal - No Clubbing, No Cyanosis, No Edema, Pulses Intact. Lower Extremity Palpation - Edema - Bilateral - No edema.  Neurologic Neurologic evaluation reveals -alert and oriented x 3 with no impairment of recent or remote memory. Mental  Status-Normal.  Musculoskeletal Global Assessment -Note: no gross deformities.  Normal Exam - Left-Upper Extremity Strength Normal and Lower Extremity Strength Normal. Normal Exam - Right-Upper Extremity Strength Normal and Lower Extremity Strength Normal.  Lymphatic Head & Neck  General Head & Neck Lymphatics: Bilateral - Description - Normal. Axillary  General Axillary Region: Bilateral - Description - Normal. Tenderness - Non Tender.    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 Recommend dx laparoscopy, distal gastrectomy, and possible feeding tube. Reviewed with patient, brother, and sister (by phone). Reviewed risks and benefits. discussed risks of bleeding, infection, damage to adjacent structures, heart/lung issues, hernia, recurrent cancer, and more. He is going to discuss with his family and think on this over the weekend.  I also discussed post op recovery, both in hospital and out of the hospital. 30 min spent in evaluation, examination, counseling, and coordination of care. >50% spent in counseling. Current Plans You are being scheduled for surgery- Our schedulers will call you.  You should hear from our office's scheduling department within 5 working days about the location, date, and time of surgery. We try to make accommodations for patient's preferences in scheduling surgery, but sometimes the OR schedule or the surgeon's schedule prevents [place] from making those accommodations.  If you have not heard from our office [phone]) in 5 working days, call the office and ask for your surgeon's nurse.  If you have other questions about your diagnosis, plan, or surgery, call the office and ask for your surgeon's nurse.  Pt Education - CCS - General recommendations   Signed by Todd Lint, [name] (04/14/2018 2:37 PM)

## 2018-05-03 ENCOUNTER — Encounter (HOSPITAL_COMMUNITY): Payer: Self-pay | Admitting: *Deleted

## 2018-05-03 ENCOUNTER — Other Ambulatory Visit: Payer: Self-pay

## 2018-05-03 NOTE — Progress Notes (Signed)
Pt denies any acute cardiopulmonary issues. Pt under the care of Dr. Harl Bowie, Cardiology. Pt stated that an echo was performed > 10 years ago. Pt stated that his last dose of Aspirin was 04/29/18 as instructed. Pt made aware to stop taking vitamins, fish oil, and herbal medications. Do not take any NSAIDs ie: Ibuprofen, Advil, Naproxen (Aleve), Motrin, BC and Goody Powder. Pt verbalized understanding of all pre-op instructions.

## 2018-05-03 NOTE — Anesthesia Preprocedure Evaluation (Addendum)
Anesthesia Evaluation  Patient identified by MRN, date of birth, ID band Patient awake    Reviewed: Allergy & Precautions, H&P , NPO status , Patient's Chart, lab work & pertinent test results  Airway Mallampati: II  TM Distance: >3 FB Neck ROM: Full    Dental no notable dental hx. (+) Edentulous Upper, Partial Lower, Dental Advisory Given   Pulmonary Current Smoker,    Pulmonary exam normal breath sounds clear to auscultation       Cardiovascular Exercise Tolerance: Good hypertension, Pt. on medications + CAD and + Peripheral Vascular Disease   Rhythm:Regular Rate:Normal     Neuro/Psych CVA negative psych ROS   GI/Hepatic Neg liver ROS, GERD  Medicated,  Endo/Other  negative endocrine ROS  Renal/GU Renal disease  negative genitourinary   Musculoskeletal   Abdominal   Peds  Hematology negative hematology ROS (+)   Anesthesia Other Findings   Reproductive/Obstetrics negative OB ROS                            Anesthesia Physical Anesthesia Plan  ASA: III  Anesthesia Plan: General   Post-op Pain Management: GA combined w/ Regional for post-op pain   Induction: Intravenous  PONV Risk Score and Plan: 1 and Midazolam, Ondansetron and Dexamethasone  Airway Management Planned: Oral ETT  Additional Equipment:   Intra-op Plan:   Post-operative Plan: Extubation in OR  Informed Consent: I have reviewed the patients History and Physical, chart, labs and discussed the procedure including the risks, benefits and alternatives for the proposed anesthesia with the patient or authorized representative who has indicated his/her understanding and acceptance.   Dental advisory given  Plan Discussed with: CRNA  Anesthesia Plan Comments:         Anesthesia Quick Evaluation

## 2018-05-04 ENCOUNTER — Other Ambulatory Visit: Payer: Self-pay

## 2018-05-04 ENCOUNTER — Inpatient Hospital Stay (HOSPITAL_COMMUNITY): Payer: Medicare Other | Admitting: Certified Registered Nurse Anesthetist

## 2018-05-04 ENCOUNTER — Encounter (HOSPITAL_COMMUNITY)
Admission: RE | Disposition: A | Discharge status: Home Health | Payer: Self-pay | Source: Home / Self Care | Attending: General Surgery

## 2018-05-04 ENCOUNTER — Encounter (HOSPITAL_COMMUNITY): Payer: Self-pay | Admitting: Certified Registered"

## 2018-05-04 ENCOUNTER — Inpatient Hospital Stay (HOSPITAL_COMMUNITY)
Admission: RE | Admit: 2018-05-04 | Discharge: 2018-05-16 | DRG: 327 | Disposition: A | Discharge status: Home Health | Payer: Medicare Other | Attending: General Surgery | Admitting: General Surgery

## 2018-05-04 DIAGNOSIS — C169 Malignant neoplasm of stomach, unspecified: Secondary | ICD-10-CM | POA: Diagnosis not present

## 2018-05-04 DIAGNOSIS — I2510 Atherosclerotic heart disease of native coronary artery without angina pectoris: Secondary | ICD-10-CM | POA: Diagnosis present

## 2018-05-04 DIAGNOSIS — Z888 Allergy status to other drugs, medicaments and biological substances status: Secondary | ICD-10-CM

## 2018-05-04 DIAGNOSIS — I129 Hypertensive chronic kidney disease with stage 1 through stage 4 chronic kidney disease, or unspecified chronic kidney disease: Secondary | ICD-10-CM | POA: Diagnosis present

## 2018-05-04 DIAGNOSIS — I10 Essential (primary) hypertension: Secondary | ICD-10-CM | POA: Diagnosis present

## 2018-05-04 DIAGNOSIS — G8918 Other acute postprocedural pain: Secondary | ICD-10-CM | POA: Diagnosis not present

## 2018-05-04 DIAGNOSIS — Z9221 Personal history of antineoplastic chemotherapy: Secondary | ICD-10-CM | POA: Diagnosis not present

## 2018-05-04 DIAGNOSIS — N182 Chronic kidney disease, stage 2 (mild): Secondary | ICD-10-CM | POA: Diagnosis not present

## 2018-05-04 DIAGNOSIS — R739 Hyperglycemia, unspecified: Secondary | ICD-10-CM | POA: Diagnosis present

## 2018-05-04 DIAGNOSIS — I700 Atherosclerosis of aorta: Secondary | ICD-10-CM | POA: Diagnosis not present

## 2018-05-04 DIAGNOSIS — Z85028 Personal history of other malignant neoplasm of stomach: Secondary | ICD-10-CM | POA: Diagnosis not present

## 2018-05-04 DIAGNOSIS — I739 Peripheral vascular disease, unspecified: Secondary | ICD-10-CM | POA: Diagnosis not present

## 2018-05-04 DIAGNOSIS — C161 Malignant neoplasm of fundus of stomach: Secondary | ICD-10-CM | POA: Diagnosis not present

## 2018-05-04 DIAGNOSIS — K219 Gastro-esophageal reflux disease without esophagitis: Secondary | ICD-10-CM | POA: Diagnosis present

## 2018-05-04 DIAGNOSIS — D62 Acute posthemorrhagic anemia: Secondary | ICD-10-CM | POA: Diagnosis not present

## 2018-05-04 DIAGNOSIS — C772 Secondary and unspecified malignant neoplasm of intra-abdominal lymph nodes: Secondary | ICD-10-CM | POA: Diagnosis not present

## 2018-05-04 DIAGNOSIS — F17290 Nicotine dependence, other tobacco product, uncomplicated: Secondary | ICD-10-CM | POA: Diagnosis present

## 2018-05-04 DIAGNOSIS — Z483 Aftercare following surgery for neoplasm: Secondary | ICD-10-CM | POA: Diagnosis not present

## 2018-05-04 DIAGNOSIS — K920 Hematemesis: Secondary | ICD-10-CM | POA: Diagnosis present

## 2018-05-04 DIAGNOSIS — K589 Irritable bowel syndrome without diarrhea: Secondary | ICD-10-CM | POA: Diagnosis not present

## 2018-05-04 DIAGNOSIS — E785 Hyperlipidemia, unspecified: Secondary | ICD-10-CM | POA: Diagnosis not present

## 2018-05-04 DIAGNOSIS — R066 Hiccough: Secondary | ICD-10-CM | POA: Diagnosis not present

## 2018-05-04 DIAGNOSIS — C163 Malignant neoplasm of pyloric antrum: Principal | ICD-10-CM | POA: Diagnosis present

## 2018-05-04 DIAGNOSIS — Z419 Encounter for procedure for purposes other than remedying health state, unspecified: Secondary | ICD-10-CM

## 2018-05-04 DIAGNOSIS — Z7982 Long term (current) use of aspirin: Secondary | ICD-10-CM | POA: Diagnosis not present

## 2018-05-04 DIAGNOSIS — Z934 Other artificial openings of gastrointestinal tract status: Secondary | ICD-10-CM | POA: Diagnosis not present

## 2018-05-04 DIAGNOSIS — C160 Malignant neoplasm of cardia: Secondary | ICD-10-CM | POA: Diagnosis not present

## 2018-05-04 DIAGNOSIS — C165 Malignant neoplasm of lesser curvature of stomach, unspecified: Secondary | ICD-10-CM | POA: Diagnosis not present

## 2018-05-04 HISTORY — PX: LAPAROSCOPY: SHX197

## 2018-05-04 HISTORY — PX: GASTRECTOMY: SHX58

## 2018-05-04 LAB — CBC
HCT: 30.3 % — ABNORMAL LOW (ref 39.0–52.0)
Hemoglobin: 9.4 g/dL — ABNORMAL LOW (ref 13.0–17.0)
MCH: 28.7 pg (ref 26.0–34.0)
MCHC: 31 g/dL (ref 30.0–36.0)
MCV: 92.4 fL (ref 78.0–100.0)
PLATELETS: 231 10*3/uL (ref 150–400)
RBC: 3.28 MIL/uL — AB (ref 4.22–5.81)
RDW: 17.4 % — ABNORMAL HIGH (ref 11.5–15.5)
WBC: 18.3 10*3/uL — ABNORMAL HIGH (ref 4.0–10.5)

## 2018-05-04 LAB — CBC WITH DIFFERENTIAL/PLATELET
Abs Immature Granulocytes: 0.1 10*3/uL (ref 0.0–0.1)
BASOS PCT: 0 %
Basophils Absolute: 0 10*3/uL (ref 0.0–0.1)
EOS ABS: 0.2 10*3/uL (ref 0.0–0.7)
Eosinophils Relative: 2 %
HCT: 36.3 % — ABNORMAL LOW (ref 39.0–52.0)
Hemoglobin: 10.9 g/dL — ABNORMAL LOW (ref 13.0–17.0)
IMMATURE GRANULOCYTES: 0 %
Lymphocytes Relative: 27 %
Lymphs Abs: 3 10*3/uL (ref 0.7–4.0)
MCH: 28.9 pg (ref 26.0–34.0)
MCHC: 30 g/dL (ref 30.0–36.0)
MCV: 96.3 fL (ref 78.0–100.0)
MONO ABS: 0.8 10*3/uL (ref 0.1–1.0)
MONOS PCT: 7 %
NEUTROS PCT: 64 %
Neutro Abs: 7.2 10*3/uL (ref 1.7–7.7)
PLATELETS: 265 10*3/uL (ref 150–400)
RBC: 3.77 MIL/uL — ABNORMAL LOW (ref 4.22–5.81)
RDW: 17.8 % — AB (ref 11.5–15.5)
WBC: 11.2 10*3/uL — ABNORMAL HIGH (ref 4.0–10.5)

## 2018-05-04 LAB — BASIC METABOLIC PANEL
Anion gap: 12 (ref 5–15)
BUN: 10 mg/dL (ref 6–20)
CALCIUM: 9.3 mg/dL (ref 8.9–10.3)
CO2: 21 mmol/L — AB (ref 22–32)
CREATININE: 1.12 mg/dL (ref 0.61–1.24)
Chloride: 106 mmol/L (ref 98–111)
GFR calc Af Amer: >60 mL/min (ref >60)
GFR calc non Af Amer: >60 mL/min (ref >60)
GLUCOSE: 80 mg/dL (ref 70–99)
Potassium: 3.6 mmol/L (ref 3.5–5.1)
Sodium: 139 mmol/L (ref 135–145)

## 2018-05-04 LAB — CREATININE, SERUM
Creatinine, Ser: 1.11 mg/dL (ref 0.61–1.24)
GFR calc Af Amer: >60 mL/min (ref >60)
GFR calc non Af Amer: >60 mL/min (ref >60)

## 2018-05-04 LAB — TYPE AND SCREEN
ABO/RH(D): A POS
ANTIBODY SCREEN: NEGATIVE

## 2018-05-04 SURGERY — LAPAROSCOPY, DIAGNOSTIC
Anesthesia: General | Site: Abdomen

## 2018-05-04 MED ORDER — KCL IN DEXTROSE-NACL 20-5-0.45 MEQ/L-%-% IV SOLN
INTRAVENOUS | Status: DC
  Administered 2018-05-04 – 2018-05-09 (×11): via INTRAVENOUS
  Filled 2018-05-04 (×16): qty 1000

## 2018-05-04 MED ORDER — GABAPENTIN 300 MG PO CAPS
300.0000 mg | ORAL_CAPSULE | Freq: Once | ORAL | Status: AC
  Administered 2018-05-04: 300 mg via ORAL

## 2018-05-04 MED ORDER — ACETAMINOPHEN 500 MG PO TABS
1000.0000 mg | ORAL_TABLET | Freq: Once | ORAL | Status: AC
  Administered 2018-05-04: 1000 mg via ORAL

## 2018-05-04 MED ORDER — BUPIVACAINE HCL (PF) 0.25 % IJ SOLN
INTRAMUSCULAR | Status: AC
  Filled 2018-05-04: qty 30

## 2018-05-04 MED ORDER — METHOCARBAMOL 1000 MG/10ML IJ SOLN
500.0000 mg | Freq: Four times a day (QID) | INTRAMUSCULAR | Status: DC | PRN
Start: 2018-05-04 — End: 2018-05-15
  Administered 2018-05-05 – 2018-05-11 (×4): 500 mg via INTRAVENOUS
  Filled 2018-05-04: qty 5
  Filled 2018-05-04: qty 500
  Filled 2018-05-04 (×6): qty 5

## 2018-05-04 MED ORDER — CEFAZOLIN SODIUM-DEXTROSE 2-4 GM/100ML-% IV SOLN
2.0000 g | Freq: Three times a day (TID) | INTRAVENOUS | Status: AC
  Administered 2018-05-04: 2 g via INTRAVENOUS
  Filled 2018-05-04: qty 100

## 2018-05-04 MED ORDER — SODIUM CHLORIDE 0.9 % IV SOLN
INTRAVENOUS | Status: DC | PRN
  Administered 2018-05-04: 25 ug/min via INTRAVENOUS

## 2018-05-04 MED ORDER — LIDOCAINE HCL 1 % IJ SOLN
INTRAMUSCULAR | Status: DC | PRN
  Administered 2018-05-04: 3 mL

## 2018-05-04 MED ORDER — FAMOTIDINE IN NACL 20-0.9 MG/50ML-% IV SOLN
20.0000 mg | Freq: Two times a day (BID) | INTRAVENOUS | Status: DC
  Administered 2018-05-04 – 2018-05-08 (×9): 20 mg via INTRAVENOUS
  Filled 2018-05-04 (×9): qty 50

## 2018-05-04 MED ORDER — DIPHENHYDRAMINE HCL 50 MG/ML IJ SOLN: 12.5000 mg | Freq: Four times a day (QID) | INTRAMUSCULAR | Status: DC | PRN

## 2018-05-04 MED ORDER — SODIUM CHLORIDE 0.9 % IR SOLN
Status: DC | PRN
  Administered 2018-05-04: 2000 mL
  Administered 2018-05-04: 1000 mL

## 2018-05-04 MED ORDER — ROPIVACAINE HCL 2 MG/ML IJ SOLN
INTRAMUSCULAR | Status: DC | PRN
  Administered 2018-05-04: 10 mL/h via EPIDURAL

## 2018-05-04 MED ORDER — ROPIVACAINE HCL 2 MG/ML IJ SOLN
2.0000 mL/h | INTRAMUSCULAR | Status: DC
  Filled 2018-05-04: qty 200

## 2018-05-04 MED ORDER — MIDAZOLAM HCL 2 MG/2ML IJ SOLN
INTRAMUSCULAR | Status: AC
  Filled 2018-05-04: qty 2

## 2018-05-04 MED ORDER — STERILE WATER FOR IRRIGATION IR SOLN
Status: DC | PRN
  Administered 2018-05-04: 1000 mL

## 2018-05-04 MED ORDER — DEXAMETHASONE SODIUM PHOSPHATE 10 MG/ML IJ SOLN
INTRAMUSCULAR | Status: DC | PRN
  Administered 2018-05-04: 10 mg via INTRAVENOUS

## 2018-05-04 MED ORDER — ALBUMIN HUMAN 5 % IV SOLN
INTRAVENOUS | Status: DC | PRN
  Administered 2018-05-04: 12:00:00 via INTRAVENOUS

## 2018-05-04 MED ORDER — HYDRALAZINE HCL 20 MG/ML IJ SOLN: 10.0000 mg | INTRAMUSCULAR | Status: DC | PRN

## 2018-05-04 MED ORDER — DIPHENHYDRAMINE HCL 12.5 MG/5ML PO ELIX: 12.5000 mg | ORAL_SOLUTION | Freq: Four times a day (QID) | ORAL | Status: DC | PRN

## 2018-05-04 MED ORDER — FENTANYL CITRATE (PF) 250 MCG/5ML IJ SOLN
INTRAMUSCULAR | Status: AC
  Filled 2018-05-04: qty 5

## 2018-05-04 MED ORDER — PROPOFOL 10 MG/ML IV BOLUS
INTRAVENOUS | Status: AC
  Filled 2018-05-04: qty 20

## 2018-05-04 MED ORDER — FENTANYL CITRATE (PF) 100 MCG/2ML IJ SOLN
INTRAMUSCULAR | Status: AC
  Filled 2018-05-04: qty 2

## 2018-05-04 MED ORDER — FENTANYL CITRATE (PF) 100 MCG/2ML IJ SOLN
INTRAMUSCULAR | Status: DC | PRN
  Administered 2018-05-04 (×2): 50 ug via INTRAVENOUS
  Administered 2018-05-04: 100 ug via INTRAVENOUS
  Administered 2018-05-04: 50 ug via INTRAVENOUS

## 2018-05-04 MED ORDER — ROCURONIUM BROMIDE 10 MG/ML (PF) SYRINGE
PREFILLED_SYRINGE | INTRAVENOUS | Status: DC | PRN
  Administered 2018-05-04 (×4): 10 mg via INTRAVENOUS
  Administered 2018-05-04: 50 mg via INTRAVENOUS
  Administered 2018-05-04 (×3): 10 mg via INTRAVENOUS

## 2018-05-04 MED ORDER — PHENYLEPHRINE 40 MCG/ML (10ML) SYRINGE FOR IV PUSH (FOR BLOOD PRESSURE SUPPORT)
PREFILLED_SYRINGE | INTRAVENOUS | Status: DC | PRN
  Administered 2018-05-04 (×2): 80 ug via INTRAVENOUS

## 2018-05-04 MED ORDER — CHLORHEXIDINE GLUCONATE CLOTH 2 % EX PADS: 6.0000 | MEDICATED_PAD | Freq: Once | CUTANEOUS | Status: DC

## 2018-05-04 MED ORDER — PROPOFOL 10 MG/ML IV BOLUS
INTRAVENOUS | Status: DC | PRN
  Administered 2018-05-04: 100 mg via INTRAVENOUS

## 2018-05-04 MED ORDER — ONDANSETRON HCL 4 MG/2ML IJ SOLN: 4.0000 mg | Freq: Four times a day (QID) | INTRAMUSCULAR | Status: DC | PRN

## 2018-05-04 MED ORDER — ROCURONIUM BROMIDE 10 MG/ML (PF) SYRINGE
PREFILLED_SYRINGE | INTRAVENOUS | Status: AC
  Filled 2018-05-04: qty 10

## 2018-05-04 MED ORDER — ONDANSETRON HCL 4 MG/2ML IJ SOLN
INTRAMUSCULAR | Status: DC | PRN
  Administered 2018-05-04: 4 mg via INTRAVENOUS

## 2018-05-04 MED ORDER — LIDOCAINE HCL 1 % IJ SOLN
INTRAMUSCULAR | Status: AC
  Filled 2018-05-04: qty 20

## 2018-05-04 MED ORDER — PROCHLORPERAZINE EDISYLATE 10 MG/2ML IJ SOLN
5.0000 mg | Freq: Four times a day (QID) | INTRAMUSCULAR | Status: DC | PRN
  Administered 2018-05-11: 10 mg via INTRAVENOUS
  Filled 2018-05-04 (×2): qty 2

## 2018-05-04 MED ORDER — LIDOCAINE 2% (20 MG/ML) 5 ML SYRINGE
INTRAMUSCULAR | Status: DC | PRN
  Administered 2018-05-04: 60 mg via INTRAVENOUS

## 2018-05-04 MED ORDER — BUPIVACAINE-EPINEPHRINE (PF) 0.25% -1:200000 IJ SOLN
INTRAMUSCULAR | Status: AC
  Filled 2018-05-04: qty 30

## 2018-05-04 MED ORDER — PROCHLORPERAZINE MALEATE 10 MG PO TABS
10.0000 mg | ORAL_TABLET | Freq: Four times a day (QID) | ORAL | Status: DC | PRN
  Filled 2018-05-04: qty 1

## 2018-05-04 MED ORDER — MIDAZOLAM HCL 2 MG/2ML IJ SOLN
INTRAMUSCULAR | Status: DC | PRN
  Administered 2018-05-04: 2 mg via INTRAVENOUS

## 2018-05-04 MED ORDER — LACTATED RINGERS IV SOLN
INTRAVENOUS | Status: DC | PRN
  Administered 2018-05-04 (×3): via INTRAVENOUS

## 2018-05-04 MED ORDER — ACETAMINOPHEN 10 MG/ML IV SOLN
1000.0000 mg | Freq: Four times a day (QID) | INTRAVENOUS | Status: AC
  Administered 2018-05-04 – 2018-05-05 (×3): 1000 mg via INTRAVENOUS
  Filled 2018-05-04 (×3): qty 100

## 2018-05-04 MED ORDER — ENOXAPARIN SODIUM 40 MG/0.4ML ~~LOC~~ SOLN: 40.0000 mg | SUBCUTANEOUS | Status: DC

## 2018-05-04 MED ORDER — CEFAZOLIN SODIUM-DEXTROSE 2-4 GM/100ML-% IV SOLN
2.0000 g | INTRAVENOUS | Status: AC
  Administered 2018-05-04 (×2): 2 g via INTRAVENOUS
  Filled 2018-05-04: qty 100

## 2018-05-04 MED ORDER — NALOXONE HCL 0.4 MG/ML IJ SOLN: 0.4000 mg | INTRAMUSCULAR | Status: DC | PRN

## 2018-05-04 MED ORDER — GABAPENTIN 300 MG PO CAPS
ORAL_CAPSULE | ORAL | Status: AC
  Administered 2018-05-04: 300 mg via ORAL
  Filled 2018-05-04: qty 1

## 2018-05-04 MED ORDER — HYDRALAZINE HCL 20 MG/ML IJ SOLN
INTRAMUSCULAR | Status: DC | PRN
  Administered 2018-05-04: 5 mg via INTRAVENOUS

## 2018-05-04 MED ORDER — ROPIVACAINE HCL 2 MG/ML IJ SOLN
10.0000 mL/h | INTRAMUSCULAR | Status: DC
  Administered 2018-05-05 – 2018-05-06 (×3): 10 mL/h via EPIDURAL
  Filled 2018-05-04 (×4): qty 200

## 2018-05-04 MED ORDER — HYDROMORPHONE HCL 1 MG/ML IJ SOLN: 0.2500 mg | INTRAMUSCULAR | Status: DC | PRN

## 2018-05-04 MED ORDER — ACETAMINOPHEN 500 MG PO TABS
ORAL_TABLET | ORAL | Status: AC
  Administered 2018-05-04: 1000 mg via ORAL
  Filled 2018-05-04: qty 1

## 2018-05-04 MED ORDER — SODIUM CHLORIDE 0.9% FLUSH: 9.0000 mL | INTRAVENOUS | Status: DC | PRN

## 2018-05-04 MED ORDER — DIPHENHYDRAMINE HCL 12.5 MG/5ML PO ELIX
12.5000 mg | ORAL_SOLUTION | Freq: Four times a day (QID) | ORAL | Status: DC | PRN
  Administered 2018-05-12: 12.5 mg via ORAL
  Filled 2018-05-04: qty 5

## 2018-05-04 MED ORDER — HYDROMORPHONE 1 MG/ML IV SOLN
INTRAVENOUS | Status: DC
  Administered 2018-05-04: 14:00:00 via INTRAVENOUS
  Filled 2018-05-04: qty 25

## 2018-05-04 MED ORDER — SUGAMMADEX SODIUM 200 MG/2ML IV SOLN
INTRAVENOUS | Status: DC | PRN
  Administered 2018-05-04: 175 mg via INTRAVENOUS

## 2018-05-04 SURGICAL SUPPLY — 96 items
ADH SKN CLS APL DERMABOND .7 (GAUZE/BANDAGES/DRESSINGS) ×2
BLADE CLIPPER SURG (BLADE) IMPLANT
BLADE SURG 10 STRL SS (BLADE) ×4 IMPLANT
CANISTER SUCT 3000ML PPV (MISCELLANEOUS) ×8 IMPLANT
CHLORAPREP W/TINT 26ML (MISCELLANEOUS) ×4 IMPLANT
CLIP VESOCCLUDE LG 6/CT (CLIP) IMPLANT
CLIP VESOCCLUDE MED 24/CT (CLIP) IMPLANT
CLIP VESOLOCK LG 6/CT PURPLE (CLIP) IMPLANT
CLIP VESOLOCK MED 6/CT (CLIP) IMPLANT
CLIP VESOLOCK MED LG 6/CT (CLIP) IMPLANT
COVER SURGICAL LIGHT HANDLE (MISCELLANEOUS) ×4 IMPLANT
DECANTER SPIKE VIAL GLASS SM (MISCELLANEOUS) ×8 IMPLANT
DERMABOND ADVANCED (GAUZE/BANDAGES/DRESSINGS) ×2
DERMABOND ADVANCED .7 DNX12 (GAUZE/BANDAGES/DRESSINGS) ×2 IMPLANT
DRAIN CHANNEL 19F RND (DRAIN) ×2 IMPLANT
DRAIN PENROSE 1/2X36 STERILE (WOUND CARE) IMPLANT
DRAPE LAPAROSCOPIC ABDOMINAL (DRAPES) ×4 IMPLANT
DRAPE UTILITY XL STRL (DRAPES) ×8 IMPLANT
DRAPE WARM FLUID 44X44 (DRAPE) ×4 IMPLANT
DRSG COVADERM 4X10 (GAUZE/BANDAGES/DRESSINGS) IMPLANT
DRSG COVADERM 4X8 (GAUZE/BANDAGES/DRESSINGS) IMPLANT
DRSG OPSITE POSTOP 4X10 (GAUZE/BANDAGES/DRESSINGS) ×2 IMPLANT
DRSG TEGADERM 4X4.75 (GAUZE/BANDAGES/DRESSINGS) ×2 IMPLANT
ELECT BLADE 6.5 EXT (BLADE) ×4 IMPLANT
ELECT CAUTERY BLADE 6.4 (BLADE) ×2 IMPLANT
ELECT REM PT RETURN 9FT ADLT (ELECTROSURGICAL) ×4
ELECTRODE REM PT RTRN 9FT ADLT (ELECTROSURGICAL) ×2 IMPLANT
EVACUATOR SILICONE 100CC (DRAIN) ×2 IMPLANT
GAUZE SPONGE 4X4 12PLY STRL (GAUZE/BANDAGES/DRESSINGS) ×4 IMPLANT
GAUZE SPONGE 4X4 12PLY STRL LF (GAUZE/BANDAGES/DRESSINGS) ×3 IMPLANT
GLOVE BIO SURGEON STRL SZ 6 (GLOVE) ×4 IMPLANT
GLOVE BIOGEL PI IND STRL 7.5 (GLOVE) ×1 IMPLANT
GLOVE BIOGEL PI INDICATOR 7.5 (GLOVE) ×2
GLOVE INDICATOR 6.5 STRL GRN (GLOVE) ×4 IMPLANT
GOWN STRL REUS W/ TWL LRG LVL3 (GOWN DISPOSABLE) ×4 IMPLANT
GOWN STRL REUS W/TWL 2XL LVL3 (GOWN DISPOSABLE) ×8 IMPLANT
GOWN STRL REUS W/TWL LRG LVL3 (GOWN DISPOSABLE) ×8
HANDLE STAPLE  ENDO EGIA 4 STD (STAPLE) ×2
HANDLE STAPLE ENDO EGIA 4 STD (STAPLE) IMPLANT
KIT BASIN OR (CUSTOM PROCEDURE TRAY) ×4 IMPLANT
KIT TUBE JEJUNAL 16FR (CATHETERS) ×3 IMPLANT
KIT TURNOVER KIT B (KITS) ×4 IMPLANT
L-HOOK LAP DISP 36CM (ELECTROSURGICAL) ×4
LHOOK LAP DISP 36CM (ELECTROSURGICAL) ×2 IMPLANT
NS IRRIG 1000ML POUR BTL (IV SOLUTION) ×8 IMPLANT
PACK GENERAL/GYN (CUSTOM PROCEDURE TRAY) ×4 IMPLANT
PAD ARMBOARD 7.5X6 YLW CONV (MISCELLANEOUS) ×8 IMPLANT
PENCIL BUTTON HOLSTER BLD 10FT (ELECTRODE) ×4 IMPLANT
RELOAD EGIA 45 MED/THCK PURPLE (STAPLE) ×3 IMPLANT
RELOAD EGIA 60 MED/THCK PURPLE (STAPLE) ×4 IMPLANT
RELOAD EGIA 60 TAN VASC (STAPLE) ×15 IMPLANT
RELOAD PROXIMATE 75MM BLUE (ENDOMECHANICALS) ×8 IMPLANT
RELOAD PROXIMATE 75MM GREEN (ENDOMECHANICALS) ×8 IMPLANT
RELOAD STAPLE 60 MED/THCK ART (STAPLE) IMPLANT
RELOAD STAPLE 75 3.8 BLU REG (ENDOMECHANICALS) IMPLANT
RELOAD STAPLE 75 4.5 GRN THCK (ENDOMECHANICALS) IMPLANT
RELOAD STAPLER LINEAR PROX 30 (STAPLE) ×2 IMPLANT
SCISSORS LAP 5X35 DISP (ENDOMECHANICALS) IMPLANT
SET IRRIG TUBING LAPAROSCOPIC (IRRIGATION / IRRIGATOR) IMPLANT
SHEARS FOC LG CVD HARMONIC 17C (MISCELLANEOUS) IMPLANT
SLEEVE ENDOPATH XCEL 5M (ENDOMECHANICALS) ×4 IMPLANT
SPECIMEN JAR X LARGE (MISCELLANEOUS) ×4 IMPLANT
SPONGE LAP 18X18 X RAY DECT (DISPOSABLE) ×22 IMPLANT
STAPLER ECHELON FLEX (STAPLE) IMPLANT
STAPLER PROXIMATE 75MM BLUE (STAPLE) ×2 IMPLANT
STAPLER RELOAD LINEAR PROX 30 (STAPLE) ×4
STAPLER RELOADABLE 30 BLU REG (STAPLE) IMPLANT
STAPLER VISISTAT 35W (STAPLE) ×4 IMPLANT
SUT ETHILON 2 0 FS 18 (SUTURE) ×6 IMPLANT
SUT ETHILON 3 0 PS 1 (SUTURE) ×2 IMPLANT
SUT MNCRL AB 4-0 PS2 18 (SUTURE) ×4 IMPLANT
SUT PDS AB 1 TP1 96 (SUTURE) ×2 IMPLANT
SUT PDS AB 3-0 SH 27 (SUTURE) ×21 IMPLANT
SUT PDS II 0 TP-1 LOOPED 60 (SUTURE) ×6 IMPLANT
SUT SILK 2 0 SH (SUTURE) ×2 IMPLANT
SUT SILK 2 0 SH CR/8 (SUTURE) ×10 IMPLANT
SUT SILK 2 0 TIES 10X30 (SUTURE) ×4 IMPLANT
SUT SILK 2 0SH CR/8 30 (SUTURE) ×6 IMPLANT
SUT SILK 3 0 SH CR/8 (SUTURE) ×7 IMPLANT
SUT SILK 3 0 TIES 10X30 (SUTURE) ×4 IMPLANT
SUT VIC AB 3-0 SH 27 (SUTURE) ×4
SUT VIC AB 3-0 SH 27X BRD (SUTURE) IMPLANT
SUT VIC AB 4-0 PS2 27 (SUTURE) ×2 IMPLANT
SYR TOOMEY 50ML (SYRINGE) ×3 IMPLANT
TOWEL OR 17X24 6PK STRL BLUE (TOWEL DISPOSABLE) ×4 IMPLANT
TOWEL OR 17X26 10 PK STRL BLUE (TOWEL DISPOSABLE) ×4 IMPLANT
TRAY FOLEY MTR SLVR 14FR STAT (SET/KITS/TRAYS/PACK) ×4 IMPLANT
TRAY LAPAROSCOPIC MC (CUSTOM PROCEDURE TRAY) ×4 IMPLANT
TROCAR XCEL BLUNT TIP 100MML (ENDOMECHANICALS) IMPLANT
TROCAR XCEL NON-BLD 11X100MML (ENDOMECHANICALS) IMPLANT
TROCAR XCEL NON-BLD 5MMX100MML (ENDOMECHANICALS) ×4 IMPLANT
TUBE CONNECTING 12'X1/4 (SUCTIONS) ×1
TUBE CONNECTING 12X1/4 (SUCTIONS) ×1 IMPLANT
TUBING INSUFFLATION (TUBING) ×4 IMPLANT
TUNNELER SHEATH ON-Q 16GX12 DP (PAIN MANAGEMENT) ×4 IMPLANT
YANKAUER SUCT BULB TIP NO VENT (SUCTIONS) ×4 IMPLANT

## 2018-05-04 NOTE — Anesthesia Procedure Notes (Signed)
Procedure Name: Intubation Date/Time: 05/04/2018 8:10 AM Performed by: Gaylene Brooks, CRNA Pre-anesthesia Checklist: Patient identified, Emergency Drugs available, Suction available and Patient being monitored Patient Re-evaluated:Patient Re-evaluated prior to induction Oxygen Delivery Method: Circle System Utilized Preoxygenation: Pre-oxygenation with 100% oxygen Induction Type: IV induction Ventilation: Mask ventilation without difficulty Laryngoscope Size: Miller and 2 Grade View: Grade I Tube type: Oral Tube size: 7.5 mm Number of attempts: 1 Airway Equipment and Method: Stylet and Oral airway Placement Confirmation: ETT inserted through vocal cords under direct vision,  positive ETCO2 and breath sounds checked- equal and bilateral Secured at: 22 cm Tube secured with: Tape Dental Injury: Teeth and Oropharynx as per pre-operative assessment

## 2018-05-04 NOTE — Op Note (Signed)
PRE-OPERATIVE DIAGNOSIS: adenocarcinoma of the gastric antrum, s/p neoadjuvant chemotherapy   POST-OPERATIVE DIAGNOSIS:  Diffuse gastric cancer  PROCEDURE:  Procedure(s): Diagnostic laparoscopy, open total gastrectomy (roux en Y esophagojejunostomy reconstruction) with feeding jejunostomy tube  SURGEON:  Surgeon(s): [name], MD  ASSISTANT:   Judyann Munson, RNFA  ANESTHESIA:   local and general  DRAINS: 84 Fr Blake drain, 16 Fr J tube  LOCAL MEDICATIONS USED:  BUPIVICAINE  and LIDOCAINE   SPECIMEN:  Source of Specimen:  distal stomach, additional proximal margin, more additional proximal margin, proximal stomach  DISPOSITION OF SPECIMEN:  PATHOLOGY  COUNTS:  YES  DICTATION: .Dragon Dictation  PLAN OF CARE: Admit to inpatient   PATIENT DISPOSITION:  PACU - hemodynamically stable.  FINDINGS:  No evidence of carcinomatosis, mass in distal stomach, thickened stomach wall, diffuse gastric cancer seen on 3 frozen sections.    EBL: 300 mL  PROCEDURE:    The patient was identified in the holding area and was taken to the OR where he was placed supine on the operating room table.  General anesthesia was induced.  Arms were tucked, and foley catheter was placed.  Abdomen was prepped and draped in sterile fashion.  Timeout was performed according to the surgical safety checklist.  When all was correct, we continued.    The patient was rotated to the left and placed into reverse trendelenburg position.  A 5 mm incision was made at the costal margin and a 5 mm Optiview trocar was placed under direct visualization.  Pneumoperitoneum was achieved to a pressure of 15 mm Hg.  The abdomen was examined with the camera and no evidence of carcinomatosis was seen.  An upper midline incision was made in the abdomen and the abdomen was then manually explored.  The stomach was mobile, and the pelvis and undersurface of the liver was palpated.  Again, no carcinomatosis was identified.    The  Bookwalter was used to assist with visualization.  The stomach was mobilized.  The omentum was taken off the colon and the lesser sac was entered.  The posterior stomach was adherent to the mesentery of the transverse colon.  This was taken en block with the tumor.  The Harmonic was used to take down the gastroepiploics and the edge of the lesser curve and greater curve were exposed.    Two green loads of the GIA 75 mm stapler were used to divide the mid stomach, but it was difficult.  The harmonic was then used to dissect superiorly and inferiorly.  Once the duodenal bulb was identified, it was divided with the TA 30 stapler.  The specimen was marked and sent to pathology.  The staple line of the duodenum was oversewn with 3-0 PDS suture.  The left gastric/common hepatic artery were skeletonized and sent with the specimen.    The frozen section was sent off.  Unfortunately, the margin was positive.  Additional stomach was removed.  While awaiting pathology, the J tube was placed.  An appropriate site in the left lateral abdomen was selected.  A 3-0 silk pursestring suture was placed and the tube was placed through the abdominal wall.  It was secured in place.  The balloon was inflated with 2.5 mL air.  The tube was witzeled with interrupted 3-0 silk sutures.  It was secured to the abdominal wall with 2-0 silks.    The second frozen section was also positive for cancer.  Additional stomach was sent for frozen as well.  When that  was positive, the remainder of the stomach was removed.    A roux en Y was created by using a blue load of the GIA stapler and divided a portion of the small bowel mesentery.  The esophagojejunostomy was performed with 2 layers.  The posterior layer was made with 2-0 silks.  The inner layer was run with 3-0 PDS suture.  The anterior layer of 2-0 silks was then performed.    A 19 Fr Blake drain was placed behind the anastamosis.  It was brought out the LUQ port site.    The  abdomen was then irrigated.  A sponge count was performed.   The fascia was then closed with two #1 looped PDS sutures.  The skin was irrigated and closed with staples.  The abdomen was then cleaned, dried, and dressed with dry sterile dressings.    Needle, sponge, and instrument counts were correct x 2.  The patient was allowed to emerge from anesthesia and taken to the PACU in stable condition.

## 2018-05-04 NOTE — Anesthesia Procedure Notes (Signed)
Epidural Patient location during procedure: pre-op Start time: 05/04/2018 7:00 AM End time: 05/04/2018 7:20 AM  Staffing Anesthesiologist: Roderic Palau, MD Performed: anesthesiologist   Preanesthetic Checklist Completed: patient identified, site marked, surgical consent, pre-op evaluation, timeout performed, IV checked, risks and benefits discussed and monitors and equipment checked  Epidural Patient position: sitting Prep: DuraPrep Patient monitoring: continuous pulse ox, blood pressure, heart rate and cardiac monitor Approach: midline Location: thoracic (1-12) Injection technique: LOR air  Needle:  Needle type: Tuohy  Needle gauge: 17 G Needle length: 9 cm and 9 Needle insertion depth: 6 cm Catheter type: closed end flexible Catheter size: 19 Gauge Catheter at skin depth: 12 cm Test dose: negative and 1.5% lidocaine with Epi 1:200 K  Assessment Sensory level: T7  Additional Notes Reason for block:at surgeon's request and post-op pain management

## 2018-05-04 NOTE — Interval H&P Note (Signed)
History and Physical Interval Note:  05/04/2018 7:53 AM  Todd [name]  has presented today for surgery, with the diagnosis of gastric cancer  The various methods of treatment have been discussed with the patient and family. After consideration of risks, benefits and other options for treatment, the patient has consented to  Procedure(s) with comments: [place] (N/A) - EPIDURAL DISTAL GASTRECTOMY (N/A) as a surgical intervention .  The patient's history has been reviewed, patient examined, no change in status, stable for surgery.  I have reviewed the patient's chart and labs.  Questions were answered to the patient's satisfaction.     [name]

## 2018-05-04 NOTE — Transfer of Care (Signed)
Immediate Anesthesia Transfer of Care Note  Patient: Todd [name]  Procedure(s) Performed: LAPAROSCOPY DIAGNOSTIC ERAS PATHWAY (N/A ) Total Gastrectomy with Feeding Jejunostomy (N/A Abdomen)  Patient Location: PACU  Anesthesia Type:GA combined with regional for post-op pain  Level of Consciousness: awake, alert , oriented and drowsy  Airway & Oxygen Therapy: Patient Spontanous Breathing and Patient connected to face mask oxygen  Post-op Assessment: Report given to RN, Post -op Vital signs reviewed and stable and Patient moving all extremities X 4  Post vital signs: Reviewed and stable  Last Vitals:  Vitals Value Taken Time  BP 131/87 05/04/2018  1:14 PM  Temp    Pulse 69 05/04/2018  1:17 PM  Resp 25 05/04/2018  1:17 PM  SpO2 100 % 05/04/2018  1:17 PM  Vitals shown include unvalidated device data.  Last Pain:  Vitals:   05/04/18 0633  TempSrc:   PainSc: 0-No pain      Patients Stated Pain Goal: 2 (99/77/41 [id number])  Complications: No apparent anesthesia complications

## 2018-05-05 ENCOUNTER — Encounter (HOSPITAL_COMMUNITY): Payer: Self-pay | Admitting: General Surgery

## 2018-05-05 DIAGNOSIS — G8918 Other acute postprocedural pain: Secondary | ICD-10-CM | POA: Diagnosis not present

## 2018-05-05 LAB — BASIC METABOLIC PANEL
Anion gap: 7 (ref 5–15)
BUN: 12 mg/dL (ref 6–20)
CHLORIDE: 106 mmol/L (ref 98–111)
CO2: 22 mmol/L (ref 22–32)
CREATININE: 1.18 mg/dL (ref 0.61–1.24)
Calcium: 8.4 mg/dL — ABNORMAL LOW (ref 8.9–10.3)
GFR calc non Af Amer: >60 mL/min (ref >60)
Glucose, Bld: 147 mg/dL — ABNORMAL HIGH (ref 70–99)
Potassium: 4 mmol/L (ref 3.5–5.1)
Sodium: 135 mmol/L (ref 135–145)

## 2018-05-05 LAB — CBC
HEMATOCRIT: 23.9 % — AB (ref 39.0–52.0)
HEMOGLOBIN: 7.4 g/dL — AB (ref 13.0–17.0)
MCH: 29 pg (ref 26.0–34.0)
MCHC: 31 g/dL (ref 30.0–36.0)
MCV: 93.7 fL (ref 78.0–100.0)
Platelets: 193 10*3/uL (ref 150–400)
RBC: 2.55 MIL/uL — ABNORMAL LOW (ref 4.22–5.81)
RDW: 17.4 % — ABNORMAL HIGH (ref 11.5–15.5)
WBC: 15.3 10*3/uL — ABNORMAL HIGH (ref 4.0–10.5)

## 2018-05-05 MED ORDER — HYDROMORPHONE HCL 1 MG/ML IJ SOLN
0.5000 mg | INTRAMUSCULAR | Status: DC | PRN
  Administered 2018-05-05 – 2018-05-11 (×16): 1 mg via INTRAVENOUS
  Filled 2018-05-05 (×17): qty 1

## 2018-05-05 NOTE — Progress Notes (Signed)
Patient tolerated NG tube well today even with water flush.  Patient seems to be doing better wit IV Hydromorphone pain meds than PCA. Patient able to turn well in the bed.  Patient did not want to get out of bed today even with encouragement.  Patient abel to reach 1250 with IS.

## 2018-05-05 NOTE — Anesthesia Postprocedure Evaluation (Signed)
Anesthesia Post Note  Patient: Todd [name]  Procedure(s) Performed: LAPAROSCOPY DIAGNOSTIC ERAS PATHWAY (N/A ) Total Gastrectomy with Feeding Jejunostomy (N/A Abdomen)     Patient location during evaluation: PACU Anesthesia Type: General and Epidural Level of consciousness: awake and alert Pain management: pain level controlled Vital Signs Assessment: post-procedure vital signs reviewed and stable Respiratory status: spontaneous breathing, nonlabored ventilation, respiratory function stable and patient connected to nasal cannula oxygen Cardiovascular status: blood pressure returned to baseline and stable Postop Assessment: no apparent nausea or vomiting Anesthetic complications: no    Last Vitals:  Vitals:   05/05/18 0300 05/05/18 0417  BP:    Pulse:    Resp:    Temp: 36.6 C   SpO2:  99%    Last Pain:  Vitals:   05/05/18 0417  TempSrc:   PainSc: 3                  Chenee Munns,W. EDMOND

## 2018-05-05 NOTE — Progress Notes (Signed)
1 Day Post-Op   Subjective/Chief Complaint: Good pain control.  No n/v.     Objective: Vital signs in last 24 hours: Temp:  [97.5 F (36.4 C)-98.8 F (37.1 C)] 98 F (36.7 C) (08/09 0758) Pulse Rate:  [51-82] 77 (08/09 0758) Resp:  [10-17] 17 (08/09 0758) BP: (106-132)/(73-94) 114/84 (08/09 0758) SpO2:  [97 %-100 %] 99 % (08/09 0758) Weight:  [83.9 kg] 83.9 kg (08/08 1659)    Intake/Output from previous day: 08/08 0701 - 08/09 0700 In: 4595.7 [I.V.:3832.7; IV Piggyback:550] Out: 6468 [Urine:1010; Drains:280; Blood:500] Intake/Output this shift: No intake/output data recorded.  General appearance: alert, cooperative and no distress Resp: breathing comfortably Cardio: regular rate and rhythm GI: soft, non distended, approp tender, dressing c/d/i.  drain serosang. Extremities: extremities normal, atraumatic, no cyanosis or edema  Lab Results:  Recent Labs    05/04/18 1656 05/05/18 0249  WBC 18.3* 15.3*  HGB 9.4* 7.4*  HCT 30.3* 23.9*  PLT 231 193   BMET Recent Labs    05/04/18 0622 05/04/18 1656 05/05/18 0249  NA 139  --  135  K 3.6  --  4.0  CL 106  --  106  CO2 21*  --  22  GLUCOSE 80  --  147*  BUN 10  --  12  CREATININE 1.12 1.11 1.18  CALCIUM 9.3  --  8.4*   PT/INR No results for input(s): LABPROT, INR in the last 72 hours. ABG No results for input(s): PHART, HCO3 in the last 72 hours.  Invalid input(s): PCO2, PO2  Studies/Results: No results found.  Anti-infectives: Anti-infectives (From admission, onward)   Start     Dose/Rate Route Frequency Ordered Stop   05/04/18 1700  ceFAZolin (ANCEF) IVPB 2g/100 mL premix     2 g 200 mL/hr over 30 Minutes Intravenous Every 8 hours 05/04/18 1645 05/04/18 1840   05/04/18 0615  ceFAZolin (ANCEF) IVPB 2g/100 mL premix     2 g 200 mL/hr over 30 Minutes Intravenous To ShortStay Surgical 05/04/18 0601 05/04/18 1224      Assessment/Plan: s/p Procedure(s) with comments: LAPAROSCOPY DIAGNOSTIC ERAS  PATHWAY (N/A) - EPIDURAL Total Gastrectomy with Feeding Jejunostomy (N/A)   Gastric cancer- diffuse.  Await pathology Do not remove foley due to urinary output monitoring  Pain control with epidural, 24 hours ofirmev, prn robaxin.  D/c pca. PRN hydralazine for hypertension (did not need) ABL anemia - monitor. Pt has active type and cross Flush J tube Nutrition consult - anticipate starting trophic feeds tomorrow.   IV pepcid for GI prophylaxis SCDs for VTE prophylaxis.  Will start lovenox when epidural is out.   Stress induced hyperglycemia - does not require treatment at this time.    LOS: 1 day    [name] 05/05/2018

## 2018-05-05 NOTE — Addendum Note (Signed)
Addendum  created 05/05/18 1008 by Audry Pili, MD   Sign clinical note

## 2018-05-05 NOTE — Anesthesia Post-op Follow-up Note (Signed)
  Anesthesia Pain Follow-up Note  Patient: Todd [name]  Day #: 1  Date of Follow-up: 05/05/2018 Time: 10:07 AM  Last Vitals:  Vitals:   05/05/18 0417 05/05/18 0758  BP:  114/84  Pulse:  77  Resp:  17  Temp:  36.7 C  SpO2: 99% 99%    Level of Consciousness: alert  Pain: 4 /10   Side Effects:None  Catheter Site Exam: Clean, dry, intact, non-erythematous, non-indurated. Slight tenderness to palpation at site.  Epidural / Intrathecal (From admission, onward)   Start     Dose/Rate Route Frequency Ordered Stop   05/04/18 1545  ropivacaine (PF) 2 mg/mL (0.2%) (NAROPIN) injection     10 mL/hr 10 mL/hr  Epidural Continuous 05/04/18 1534 05/09/18 1544       Plan: Continue current therapy of postop epidural at surgeon's request  Audry Pili

## 2018-05-06 ENCOUNTER — Other Ambulatory Visit: Payer: Self-pay

## 2018-05-06 DIAGNOSIS — G8918 Other acute postprocedural pain: Secondary | ICD-10-CM | POA: Diagnosis not present

## 2018-05-06 LAB — BASIC METABOLIC PANEL
ANION GAP: 6 (ref 5–15)
BUN: 6 mg/dL (ref 6–20)
CO2: 25 mmol/L (ref 22–32)
Calcium: 8.6 mg/dL — ABNORMAL LOW (ref 8.9–10.3)
Chloride: 106 mmol/L (ref 98–111)
Creatinine, Ser: 1.01 mg/dL (ref 0.61–1.24)
GFR calc Af Amer: >60 mL/min (ref >60)
GFR calc non Af Amer: >60 mL/min (ref >60)
GLUCOSE: 102 mg/dL — AB (ref 70–99)
POTASSIUM: 3.8 mmol/L (ref 3.5–5.1)
Sodium: 137 mmol/L (ref 135–145)

## 2018-05-06 LAB — CBC
HEMATOCRIT: 26.6 % — AB (ref 39.0–52.0)
Hemoglobin: 8.3 g/dL — ABNORMAL LOW (ref 13.0–17.0)
MCH: 28.7 pg (ref 26.0–34.0)
MCHC: 31.2 g/dL (ref 30.0–36.0)
MCV: 92 fL (ref 78.0–100.0)
Platelets: 201 10*3/uL (ref 150–400)
RBC: 2.89 MIL/uL — AB (ref 4.22–5.81)
RDW: 17.3 % — ABNORMAL HIGH (ref 11.5–15.5)
WBC: 13.2 10*3/uL — ABNORMAL HIGH (ref 4.0–10.5)

## 2018-05-06 LAB — PHOSPHORUS
PHOSPHORUS: 2.9 mg/dL (ref 2.5–4.6)
Phosphorus: 2.8 mg/dL (ref 2.5–4.6)

## 2018-05-06 LAB — MAGNESIUM
MAGNESIUM: 1.6 mg/dL — AB (ref 1.7–2.4)
MAGNESIUM: 1.6 mg/dL — AB (ref 1.7–2.4)

## 2018-05-06 LAB — GLUCOSE, CAPILLARY: Glucose-Capillary: 98 mg/dL (ref 70–99)

## 2018-05-06 MED ORDER — PRO-STAT SUGAR FREE PO LIQD: 30.0000 mL | Freq: Every day | ORAL | Status: DC

## 2018-05-06 MED ORDER — OSMOLITE 1.5 CAL PO LIQD
1000.0000 mL | ORAL | Status: DC
  Filled 2018-05-06 (×2): qty 1000

## 2018-05-06 MED ORDER — VITAL HIGH PROTEIN PO LIQD: 1000.0000 mL | ORAL | Status: DC

## 2018-05-06 MED ORDER — ENOXAPARIN SODIUM 40 MG/0.4ML ~~LOC~~ SOLN
40.0000 mg | SUBCUTANEOUS | Status: DC
  Administered 2018-05-07 – 2018-05-15 (×10): 40 mg via SUBCUTANEOUS
  Filled 2018-05-06 (×10): qty 0.4

## 2018-05-06 NOTE — Progress Notes (Signed)
Call to Dr Donne Hazel with information that NG out and orders.

## 2018-05-06 NOTE — Progress Notes (Signed)
Patient tolerated sitting up in bedside chair will for approx 2 hrs. Stating he feels the best now than ever..with NG tube out.  Did instuct patient PLOC would be looked at again in the am and what to do about his tube feedings.

## 2018-05-06 NOTE — Progress Notes (Signed)
Call to PA on call for Trauma that NG tube came out after patient is now sitting in bedside chair. [phone]

## 2018-05-06 NOTE — Progress Notes (Signed)
Patient reported to this nurse that it felt like his IV was leaking. This nurse assessed site and it was leaking as well as his hand was very swollen. IV infusion stopped and IV was removed. Attempted to place a new IV in the other hand with out success. Consult for IV team placed. Will continue to monitor. Katherina Right, RN. 05/06/18 [phone]

## 2018-05-06 NOTE — Addendum Note (Signed)
Addendum  created 05/06/18 1513 by Oleta Mouse, MD   LDA properties accepted, Order list changed, Sign clinical note

## 2018-05-06 NOTE — Progress Notes (Signed)
Upon entering room after Anesthesia, NG had disconnected at the connection site for suction. Nurse had been in the room prior with no leakage noted.

## 2018-05-06 NOTE — Progress Notes (Signed)
Initial Nutrition Assessment  DOCUMENTATION CODES:   Not applicable  INTERVENTION:   Osmolite 1.5 @ 20 ml/hr (480 ml) via J tube Increase per surgery to goal rate of 65 ml/hr (1560 ml) 30 ml Prostat once/day  At goal TF provides: 2440 kcals, 113 grams protein, 1188 ml free water. Meets 100% of needs.   NUTRITION DIAGNOSIS:   Increased nutrient needs related to cancer and cancer related treatments as evidenced by estimated needs.  GOAL:   Patient will meet greater than or equal to 90% of their needs  MONITOR:   Weight trends, Labs, Diet advancement, TF tolerance, I & O's  REASON FOR ASSESSMENT:   Consult Enteral/tube feeding initiation and management  ASSESSMENT:   Patient with PMH significant for gastric cancer currently on chemotherapy. Presents this admission for gastric surgery.    8/8- laparoscopy, open total gastrectomy (roux en Y esophagojejunostomy reconstruction) with feeding jejunostomy tube  Pt with NGT to suction. Reports a decreased PO intake since being diagnosed with gastric cancer in May 2019. States he tries to eat 4 small meals/day that consist of bologna with cheese sandwiches, peanut butter with jelly sandwiches, and fish. Pt drinks up to 5 Ensures/day that are provided to him by [facility]. Pt is positive for taste changes but states he forces himself to eat any ways. RD consulted to begin trickle feeding via J- tube.  Pt remains NPO.   Pt endorses a UBW of 185 lb and a recent wt loss of 10 lb over the last 3 months. Records indicate pt weighed 202 lb 10/03/17 and 185 lb this admission (8.4% wt loss in 8 months, insignificant for time frame). Nutrition-Focused physical exam completed.   UOP: 4150 ml x 24 hrs NGT: 100 ml x 24 hrs  Medications reviewed and include: D5 with 20 mEq KCl @ 125 ml/hr Labs reviewed: Mg 1.6 (L)  NUTRITION - FOCUSED PHYSICAL EXAM:    Most Recent Value  Orbital Region  No depletion  Upper Arm Region  Mild  depletion  Thoracic and Lumbar Region  Unable to assess  Buccal Region  No depletion  Temple Region  Mild depletion  Clavicle Bone Region  Moderate depletion  Clavicle and Acromion Bone Region  Moderate depletion  Scapular Bone Region  Unable to assess  Dorsal Hand  No depletion  Patellar Region  Mild depletion  Anterior Thigh Region  Mild depletion  Posterior Calf Region  Mild depletion  Edema (RD Assessment)  None  Hair  Reviewed  Eyes  Reviewed  Mouth  Reviewed  Skin  Reviewed  Nails  Reviewed     Diet Order:   Diet Order            Diet NPO time specified  Diet effective now              EDUCATION NEEDS:   Education needs have been addressed  Skin:  Skin Assessment: Skin Integrity Issues: Skin Integrity Issues:: Incisions Incisions: abdomen  Last BM:  PTA  Height:   Ht Readings from Last 1 Encounters:  05/04/18 6\' 1"  (1.854 m)    Weight:   Wt Readings from Last 1 Encounters:  05/04/18 83.9 kg    Ideal Body Weight:  83.6 kg  BMI:  Body mass index is 24.4 kg/m.  Estimated Nutritional Needs:   Kcal:  5852-7782 kcal  Protein:  110-120 grams  Fluid:  >/= 2.3 L/day  Mariana Single RD, LDN Clinical Nutrition Pager # - [phone]

## 2018-05-06 NOTE — Progress Notes (Signed)
[facility] Progress Note  2 Days Post-Op  Subjective: CC: no complaints Patient states has abdominal pain, but not unamanageable. Has not been able to get up much. No flatus. Denies nausea.   Objective: Vital signs in last 24 hours: Temp:  [98.1 F (36.7 C)-98.8 F (37.1 C)] 98.1 F (36.7 C) (08/10 0755) Pulse Rate:  [77-92] 81 (08/10 0755) Resp:  [14-20] 20 (08/10 0755) BP: (126-152)/(75-93) 152/85 (08/10 0755) SpO2:  [94 %-98 %] 97 % (08/10 0755) Last BM Date: (PTA)  Intake/Output from previous day: 08/09 0701 - 08/10 0700 In: 2830.2 [I.V.:2457.1; NG/GT:60; IV Piggyback:100] Out: 4315 [Urine:4150; Emesis/NG output:100; Drains:210] Intake/Output this shift: No intake/output data recorded.  PE: Gen:  Alert, NAD, pleasant Card:  Regular rate and rhythm, pedal pulses 2+ BL Pulm:  Normal effort, clear to auscultation bilaterally Abd: Soft, appropriately tender, non-distended, bowel sounds hypoactive, no HSM, incision C/D/I, JP with SS drainage, J tube in place Skin: warm and dry, no rashes  Psych: A&Ox3   Lab Results:  Recent Labs    05/05/18 0249 05/06/18 0331  WBC 15.3* 13.2*  HGB 7.4* 8.3*  HCT 23.9* 26.6*  PLT 193 201   BMET Recent Labs    05/05/18 0249 05/06/18 0331  NA 135 137  K 4.0 3.8  CL 106 106  CO2 22 25  GLUCOSE 147* 102*  BUN 12 6  CREATININE 1.18 1.01  CALCIUM 8.4* 8.6*   PT/INR No results for input(s): LABPROT, INR in the last 72 hours. CMP     Component Value Date/Time   NA 137 05/06/2018 0331   K 3.8 05/06/2018 0331   CL 106 05/06/2018 0331   CO2 25 05/06/2018 0331   GLUCOSE 102 (H) 05/06/2018 0331   BUN 6 05/06/2018 0331   CREATININE 1.01 05/06/2018 0331   CREATININE 1.17 08/08/2012 1640   CALCIUM 8.6 (L) 05/06/2018 0331   PROT 6.6 04/05/2018 1844   ALBUMIN 3.3 (L) 04/05/2018 1844   AST 19 04/05/2018 1844   ALT 13 04/05/2018 1844   ALKPHOS 66 04/05/2018 1844   BILITOT 0.3 04/05/2018 1844   GFRNONAA >60  05/06/2018 0331   GFRAA >60 05/06/2018 0331   Lipase     Component Value Date/Time   LIPASE 89 (H) 04/05/2018 1844       Studies/Results: No results found.  Anti-infectives: Anti-infectives (From admission, onward)   Start     Dose/Rate Route Frequency Ordered Stop   05/04/18 1700  ceFAZolin (ANCEF) IVPB 2g/100 mL premix     2 g 200 mL/hr over 30 Minutes Intravenous Every 8 hours 05/04/18 1645 05/04/18 1840   05/04/18 0615  ceFAZolin (ANCEF) IVPB 2g/100 mL premix     2 g 200 mL/hr over 30 Minutes Intravenous To ShortStay Surgical 05/04/18 0601 05/04/18 1224       Assessment/Plan S/p total gastrectomy with feeding jejunostomy 05/04/18 Dr. Barry Dienes - POD#2 - start trophic TF today  - pain control and mobilize - remove surgical dressing today and cleanse wound gently  - continue IV pepcid - continue NGT - UGI per Dr. Barry Dienes early next week  ABL anemia - H/H 8.3/26.6, stable  FEN: NPO, IVF; trophic TF to start VTE: SCDs, lovenox for DVT prophylaxis ID: ancef periop  LOS: 2 days    Brigid Re , [facility] Surgery 05/06/2018, 9:55 AM Pager: [phone] Consults: [phone] Mon-Fri 7:00 am-4:30 pm Sat-Sun 7:00 am-11:30 am

## 2018-05-06 NOTE — Anesthesia Post-op Follow-up Note (Signed)
  Anesthesia Pain Follow-up Note  Patient: Todd [name]  Day #: 2  Date of Follow-up: 05/06/2018 Time: 3:12 PM  Last Vitals:  Vitals:   05/06/18 0755 05/06/18 1219  BP: (!) 152/85 124/86  Pulse: 81 88  Resp: 20 15  Temp: 36.7 C 37.4 C  SpO2: 97% 97%    Level of Consciousness: alert  Pain: none   Side Effects:None  Catheter Site Exam:clean, dry  Anti-Coag Meds (From admission, onward)   Start     Dose/Rate Route Frequency Ordered Stop   05/06/18 1145  enoxaparin (LOVENOX) injection 40 mg     40 mg Subcutaneous Every 24 hours 05/06/18 1133      Epidural / Intrathecal (From admission, onward)   Start     Dose/Rate Route Frequency Ordered Stop   05/04/18 1545  ropivacaine (PF) 2 mg/mL (0.2%) (NAROPIN) injection     10 mL/hr 10 mL/hr  Epidural Continuous 05/04/18 1534 05/09/18 1544       Plan: Catheter removed/tip intact at surgeon's request and D/C Infusion at surgeon's request  Belky Mundo

## 2018-05-07 LAB — BASIC METABOLIC PANEL
Anion gap: 7 (ref 5–15)
BUN: 6 mg/dL (ref 6–20)
CHLORIDE: 103 mmol/L (ref 98–111)
CO2: 26 mmol/L (ref 22–32)
CREATININE: 0.89 mg/dL (ref 0.61–1.24)
Calcium: 8.7 mg/dL — ABNORMAL LOW (ref 8.9–10.3)
GFR calc Af Amer: >60 mL/min (ref >60)
GFR calc non Af Amer: >60 mL/min (ref >60)
GLUCOSE: 95 mg/dL (ref 70–99)
POTASSIUM: 3.9 mmol/L (ref 3.5–5.1)
SODIUM: 136 mmol/L (ref 135–145)

## 2018-05-07 LAB — MAGNESIUM
MAGNESIUM: 1.8 mg/dL (ref 1.7–2.4)
Magnesium: 1.7 mg/dL (ref 1.7–2.4)

## 2018-05-07 LAB — CBC
HCT: 26.5 % — ABNORMAL LOW (ref 39.0–52.0)
Hemoglobin: 8.5 g/dL — ABNORMAL LOW (ref 13.0–17.0)
MCH: 29.1 pg (ref 26.0–34.0)
MCHC: 32.1 g/dL (ref 30.0–36.0)
MCV: 90.8 fL (ref 78.0–100.0)
PLATELETS: 217 10*3/uL (ref 150–400)
RBC: 2.92 MIL/uL — ABNORMAL LOW (ref 4.22–5.81)
RDW: 16.9 % — ABNORMAL HIGH (ref 11.5–15.5)
WBC: 10.5 10*3/uL (ref 4.0–10.5)

## 2018-05-07 LAB — PHOSPHORUS
PHOSPHORUS: 3.6 mg/dL (ref 2.5–4.6)
Phosphorus: 3.8 mg/dL (ref 2.5–4.6)

## 2018-05-07 LAB — GLUCOSE, CAPILLARY
GLUCOSE-CAPILLARY: 90 mg/dL (ref 70–99)
Glucose-Capillary: 201 mg/dL — ABNORMAL HIGH (ref 70–99)
Glucose-Capillary: 97 mg/dL (ref 70–99)
Glucose-Capillary: 100 mg/dL — ABNORMAL HIGH (ref 70–99)

## 2018-05-07 LAB — MRSA PCR SCREENING: MRSA BY PCR: NEGATIVE

## 2018-05-07 MED ORDER — PRO-STAT SUGAR FREE PO LIQD
30.0000 mL | Freq: Two times a day (BID) | ORAL | Status: DC
  Administered 2018-05-07 – 2018-05-08 (×4): 30 mL
  Filled 2018-05-07 (×4): qty 30

## 2018-05-07 MED ORDER — OSMOLITE 1.5 CAL PO LIQD
1000.0000 mL | ORAL | Status: DC
  Administered 2018-05-07 – 2018-05-08 (×2): 1000 mL
  Filled 2018-05-07: qty 1000

## 2018-05-07 NOTE — Evaluation (Signed)
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  Clinical Impression   Patient is s/p above surgery resulting in functional limitations due to the deficits listed below (see PT Problem List). Independent prior to admission; Presents with functional dependencies outlined below; Very motivated to get better, and he has lots of questions re: how to manage feeds/eating;  Patient will benefit from skilled PT to increase their independence and safety with mobility to allow discharge to the venue listed below.       Follow Up Recommendations Home health PT    Equipment Recommendations  Rolling walker with 5" wheels    Recommendations for Other Services OT consult(as ordered)     Precautions / Restrictions Precautions Precaution Comments: multiple lines, JP drain      Mobility  Bed Mobility Overal bed mobility: Needs Assistance Bed Mobility: Rolling;Sidelying to Sit Rolling: Min guard Sidelying to sit: Min assist       General bed mobility comments: Cues for log roll technqiue, tactile cues as well; min assist to elelvate trunk to sit  Transfers Overall transfer level: Needs assistance Equipment used: Rolling walker (2 wheeled) Transfers: Sit to/from Stand Sit to Stand: Mod assist          General transfer comment: Light mod assist to power up; cues for hand placemenmt  Ambulation/Gait Ambulation/Gait assistance: Min assist Gait Distance (Feet): 120 Feet Assistive device: Rolling walker (2 wheeled) Gait Pattern/deviations: Step-through pattern;Decreased step length - right;Decreased step length - left;Decreased stride length Gait velocity: slowed   General Gait Details: Overall walkign well with RW; one instance of loss of balance posteriorly with turning requiring min assist to regain balance  Stairs            Wheelchair Mobility    Modified Rankin (Stroke Patients Only)       Balance                                             Pertinent Vitals/Pain Pain Assessment: 0-10 Pain Score: 3  Pain Location: abdomen, with transitions; subsides quickly; pain also with laughing, coughing, sneezing; taught patient pillow splinting Pain Descriptors / Indicators: Aching Pain Intervention(s): Monitored during session    Home Living Family/patient expects to be discharged to:: Private residence Living Arrangements: Alone Available Help at Discharge: Friend(s);Available PRN/intermittently Type of Home: Apartment Home Access: Level entry;Elevator     Home Layout: One level        Prior Function Level of Independence: Independent         Comments: enjoys cars and driving     Hand Dominance        Extremity/Trunk Assessment   Upper Extremity Assessment Upper Extremity Assessment: Defer to OT evaluation    Lower Extremity Assessment Lower Extremity Assessment: Generalized weakness       Communication  Communication: No difficulties  Cognition Arousal/Alertness: Awake/alert Behavior During Therapy: WFL for tasks assessed/performed Overall Cognitive Status: Within Functional Limits for tasks assessed                                        General Comments General comments (skin integrity, edema, etc.):  Taught pt pillow splinting for help with pain with laughing, coughing, etc; VSS HR incr to 114 with activity    Exercises     Assessment/Plan    PT Assessment Patient needs continued PT services  PT Problem List Decreased strength;Decreased activity tolerance;Decreased balance;Decreased mobility;Decreased knowledge of use of DME;Decreased knowledge of precautions;Pain       PT Treatment Interventions DME instruction;Gait training;Stair training;Functional mobility training;Therapeutic activities;Therapeutic exercise;Patient/family education;Balance training    PT Goals (Current goals can be found in the Care Plan section)  Acute Rehab PT Goals Patient Stated Goal: hopes to get back to normal; Wants to know if and how his eating will change PT Goal Formulation: With patient Time For Goal Achievement: 05/21/18 Potential to Achieve Goals: Good    Frequency Min 3X/week   Barriers to discharge Decreased caregiver support Must be overall independent to dc home; has a friend who can check in on him    Co-evaluation               AM-PAC PT "6 Clicks" Daily Activity  Outcome Measure Difficulty turning over in bed (including adjusting bedclothes, sheets and blankets)?: A Little Difficulty moving from lying on back to sitting on the side of the bed? : A Little Difficulty sitting down on and standing up from a chair with arms (e.g., wheelchair, bedside commode, etc,.)?: A Lot Help needed moving to and from a bed to chair (including a wheelchair)?: A Little Help needed walking in hospital room?: A Little Help needed climbing 3-5 steps with a railing? : A Little 6 Click Score: 17    End of Session   Activity Tolerance: Patient tolerated treatment well Patient left: in chair;with call bell/phone within reach Nurse Communication: Mobility status PT Visit Diagnosis: Unsteadiness on feet (R26.81);Other abnormalities of gait and mobility (R26.89);Muscle weakness (generalized)  (M62.81)    Time: 0865-7846 PT Time Calculation (min) (ACUTE ONLY): 30 min   Charges:   PT Evaluation $PT Eval Moderate Complexity: 1 Mod PT Treatments $Gait Training: 8-22 mins        Van Clines, PT  Acute Rehabilitation Services Pager [phone] Office [phone]   Todd [name] 05/07/2018, 11:29 AM

## 2018-05-07 NOTE — Progress Notes (Addendum)
[facility] Progress Note  3 Days Post-Op  Subjective: CC: NGT came out overnight Patient denies bloating or nausea. Got up this AM. No flatus or BM. Pain control improving.   Objective: Vital signs in last 24 hours: Temp:  [98.3 F (36.8 C)-100.1 F (37.8 C)] 98.4 F (36.9 C) (08/11 0700) Pulse Rate:  [78-95] 83 (08/11 0436) Resp:  [14-20] 20 (08/11 0436) BP: (124-159)/(85-93) 138/90 (08/11 0436) SpO2:  [97 %-99 %] 99 % (08/11 0436) Last BM Date: (PTA)  Intake/Output from previous day: 08/10 0701 - 08/11 0700 In: 2658.6 [I.V.:2414.2; IV Piggyback:125.4] Out: 4405 [Urine:4325; Drains:80] Intake/Output this shift: Total I/O In: 0  Out: 700 [Urine:700]  PE: Gen:  Alert, NAD, pleasant Card:  Regular rate and rhythm, pedal pulses 2+ BL Pulm:  Normal effort, clear to auscultation bilaterally Abd: Soft, appropriately tender, non-distended, bowel sounds hypoactive, no HSM, incision C/D/I, JP with SS drainage, J tube in place Skin: warm and dry, no rashes  Psych: A&Ox3   Lab Results:  Recent Labs    05/06/18 0331 05/07/18 0602  WBC 13.2* 10.5  HGB 8.3* 8.5*  HCT 26.6* 26.5*  PLT 201 217   BMET Recent Labs    05/06/18 0331 05/07/18 0602  NA 137 136  K 3.8 3.9  CL 106 103  CO2 25 26  GLUCOSE 102* 95  BUN 6 6  CREATININE 1.01 0.89  CALCIUM 8.6* 8.7*   PT/INR No results for input(s): LABPROT, INR in the last 72 hours. CMP     Component Value Date/Time   NA 136 05/07/2018 0602   K 3.9 05/07/2018 0602   CL 103 05/07/2018 0602   CO2 26 05/07/2018 0602   GLUCOSE 95 05/07/2018 0602   BUN 6 05/07/2018 0602   CREATININE 0.89 05/07/2018 0602   CREATININE 1.17 08/08/2012 1640   CALCIUM 8.7 (L) 05/07/2018 0602   PROT 6.6 04/05/2018 1844   ALBUMIN 3.3 (L) 04/05/2018 1844   AST 19 04/05/2018 1844   ALT 13 04/05/2018 1844   ALKPHOS 66 04/05/2018 1844   BILITOT 0.3 04/05/2018 1844   GFRNONAA >60 05/07/2018 0602   GFRAA >60 05/07/2018 0602    Lipase     Component Value Date/Time   LIPASE 89 (H) 04/05/2018 1844       Studies/Results: No results found.  Anti-infectives: Anti-infectives (From admission, onward)   Start     Dose/Rate Route Frequency Ordered Stop   05/04/18 1700  ceFAZolin (ANCEF) IVPB 2g/100 mL premix     2 g 200 mL/hr over 30 Minutes Intravenous Every 8 hours 05/04/18 1645 05/04/18 1840   05/04/18 0615  ceFAZolin (ANCEF) IVPB 2g/100 mL premix     2 g 200 mL/hr over 30 Minutes Intravenous To ShortStay Surgical 05/04/18 0601 05/04/18 1224       Assessment/Plan S/p total gastrectomy with feeding jejunostomy 05/04/18 Dr. Barry Dienes - POD#3 - start trophic TF today  - pain control and mobilize - continue IV pepcid - NGT accidentally removed overnight, ok to leave out  ABL anemia - H/H 8.5/26.5, stable  FEN: NPO, IVF; trophic TF to start VTE: SCDs, lovenox for DVT prophylaxis ID: ancef periop  LOS: 3 days    Brigid Re , [facility] Surgery 05/07/2018, 11:23 AM Pager: [phone] Consults: [phone] Mon-Fri 7:00 am-4:30 pm Sat-Sun 7:00 am-11:30 am

## 2018-05-08 ENCOUNTER — Inpatient Hospital Stay (HOSPITAL_COMMUNITY): Payer: Medicare Other

## 2018-05-08 LAB — BASIC METABOLIC PANEL
ANION GAP: 7 (ref 5–15)
BUN: 9 mg/dL (ref 6–20)
CALCIUM: 8.6 mg/dL — AB (ref 8.9–10.3)
CO2: 25 mmol/L (ref 22–32)
Chloride: 105 mmol/L (ref 98–111)
Creatinine, Ser: 0.92 mg/dL (ref 0.61–1.24)
GFR calc non Af Amer: >60 mL/min (ref >60)
Glucose, Bld: 95 mg/dL (ref 70–99)
POTASSIUM: 4.1 mmol/L (ref 3.5–5.1)
Sodium: 137 mmol/L (ref 135–145)

## 2018-05-08 LAB — CBC
HEMATOCRIT: 24.5 % — AB (ref 39.0–52.0)
HEMOGLOBIN: 7.8 g/dL — AB (ref 13.0–17.0)
MCH: 29.1 pg (ref 26.0–34.0)
MCHC: 31.8 g/dL (ref 30.0–36.0)
MCV: 91.4 fL (ref 78.0–100.0)
Platelets: 201 10*3/uL (ref 150–400)
RBC: 2.68 MIL/uL — ABNORMAL LOW (ref 4.22–5.81)
RDW: 16.5 % — AB (ref 11.5–15.5)
WBC: 8.7 10*3/uL (ref 4.0–10.5)

## 2018-05-08 LAB — GLUCOSE, CAPILLARY
GLUCOSE-CAPILLARY: 101 mg/dL — AB (ref 70–99)
GLUCOSE-CAPILLARY: 81 mg/dL (ref 70–99)
GLUCOSE-CAPILLARY: 82 mg/dL (ref 70–99)
GLUCOSE-CAPILLARY: 85 mg/dL (ref 70–99)
GLUCOSE-CAPILLARY: 86 mg/dL (ref 70–99)
Glucose-Capillary: 93 mg/dL (ref 70–99)

## 2018-05-08 MED ORDER — IOHEXOL 300 MG/ML  SOLN
150.0000 mL | Freq: Once | INTRAMUSCULAR | Status: AC | PRN
  Administered 2018-05-08: 125 mL via ORAL

## 2018-05-08 MED ORDER — OSMOLITE 1.5 CAL PO LIQD
1000.0000 mL | ORAL | Status: DC
  Administered 2018-05-08: 1000 mL
  Filled 2018-05-08: qty 1000

## 2018-05-08 NOTE — Progress Notes (Addendum)
[facility] Progress Note  4 Days Post-Op  Subjective: CC:  No flatus yet.  No f/c.  No n/v.  Epidural out.  Objective: Vital signs in last 24 hours: Temp:  [98.2 F (36.8 C)-98.8 F (37.1 C)] 98.2 F (36.8 C) (08/12 0749) Pulse Rate:  [76-95] 95 (08/12 0600) Resp:  [15-19] 17 (08/12 0600) BP: (103-163)/(63-97) 130/81 (08/12 0400) SpO2:  [94 %-98 %] 96 % (08/12 0600) Weight:  [82.9 kg] 82.9 kg (08/12 0500) Last BM Date: 05/03/18  Intake/Output from previous day: 08/11 0701 - 08/12 0700 In: 3280.1 [I.V.:2975.8; NG/GT:173.2; IV Piggyback:131.1] Out: 2270 [Urine:2125; Drains:145] Intake/Output this shift: Total I/O In: 0  Out: 440 [Urine:400; Drains:40]  PE: Gen:  Alert, NAD, pleasant Card:  Regular rate and rhythm Pulm:  Normal effort Abd: soft, incision c/d/i.   Skin: warm and dry, no rashes  Psych: A&Ox3   Lab Results:  Recent Labs    05/07/18 0602 05/08/18 0243  WBC 10.5 8.7  HGB 8.5* 7.8*  HCT 26.5* 24.5*  PLT 217 201   BMET Recent Labs    05/07/18 0602 05/08/18 0243  NA 136 137  K 3.9 4.1  CL 103 105  CO2 26 25  GLUCOSE 95 95  BUN 6 9  CREATININE 0.89 0.92  CALCIUM 8.7* 8.6*   PT/INR No results for input(s): LABPROT, INR in the last 72 hours. CMP     Component Value Date/Time   NA 137 05/08/2018 0243   K 4.1 05/08/2018 0243   CL 105 05/08/2018 0243   CO2 25 05/08/2018 0243   GLUCOSE 95 05/08/2018 0243   BUN 9 05/08/2018 0243   CREATININE 0.92 05/08/2018 0243   CREATININE 1.17 08/08/2012 1640   CALCIUM 8.6 (L) 05/08/2018 0243   PROT 6.6 04/05/2018 1844   ALBUMIN 3.3 (L) 04/05/2018 1844   AST 19 04/05/2018 1844   ALT 13 04/05/2018 1844   ALKPHOS 66 04/05/2018 1844   BILITOT 0.3 04/05/2018 1844   GFRNONAA >60 05/08/2018 0243   GFRAA >60 05/08/2018 0243   Lipase     Component Value Date/Time   LIPASE 89 (H) 04/05/2018 1844       Studies/Results: No results found.  Anti-infectives: Anti-infectives (From  admission, onward)   Start     Dose/Rate Route Frequency Ordered Stop   05/04/18 1700  ceFAZolin (ANCEF) IVPB 2g/100 mL premix     2 g 200 mL/hr over 30 Minutes Intravenous Every 8 hours 05/04/18 1645 05/04/18 1840   05/04/18 0615  ceFAZolin (ANCEF) IVPB 2g/100 mL premix     2 g 200 mL/hr over 30 Minutes Intravenous To [facility] 05/04/18 0601 05/04/18 1224       Assessment/Plan S/p total gastrectomy with feeding jejunostomy 05/04/18 Dr. Barry Dienes - POD#4 - increase tube feeds.   - pain control and mobilize - continue IV pepcid - upper GI to eval for leak.    ABL anemia - stable  FEN: NPO, IVF; increase tube feeds VTE: SCDs, lovenox for DVT prophylaxis ID: ancef periop   LOS: 4 days    [name] , MD [facility] 05/08/2018, 1:36 PM

## 2018-05-08 NOTE — Care Management Obs Status (Signed)
[employer] NOTIFICATION   Patient Details  Name: GORJE IYER MRN: [id number] Date of Birth: 10-26-1960   Medicare Observation Status Notification Given:       Orbie Pyo 05/08/2018, 4:12 PM

## 2018-05-08 NOTE — Care Management Important Message (Signed)
Important Message  Patient Details  Name: Todd [name] MRN: [id number] Date of Birth: 06/19/1961   Medicare Important Message Given:  Yes    Orbie Pyo 05/08/2018, 4:12 PM

## 2018-05-09 LAB — CBC
HEMATOCRIT: 24.5 % — AB (ref 39.0–52.0)
Hemoglobin: 7.9 g/dL — ABNORMAL LOW (ref 13.0–17.0)
MCH: 29.2 pg (ref 26.0–34.0)
MCHC: 32.2 g/dL (ref 30.0–36.0)
MCV: 90.4 fL (ref 78.0–100.0)
PLATELETS: 235 10*3/uL (ref 150–400)
RBC: 2.71 MIL/uL — ABNORMAL LOW (ref 4.22–5.81)
RDW: 16.5 % — ABNORMAL HIGH (ref 11.5–15.5)
WBC: 8.1 10*3/uL (ref 4.0–10.5)

## 2018-05-09 LAB — BASIC METABOLIC PANEL
Anion gap: 7 (ref 5–15)
BUN: 10 mg/dL (ref 6–20)
CALCIUM: 8.6 mg/dL — AB (ref 8.9–10.3)
CO2: 25 mmol/L (ref 22–32)
CREATININE: 0.95 mg/dL (ref 0.61–1.24)
Chloride: 105 mmol/L (ref 98–111)
GFR calc Af Amer: >60 mL/min (ref >60)
GFR calc non Af Amer: >60 mL/min (ref >60)
GLUCOSE: 95 mg/dL (ref 70–99)
Potassium: 4.1 mmol/L (ref 3.5–5.1)
Sodium: 137 mmol/L (ref 135–145)

## 2018-05-09 LAB — GLUCOSE, CAPILLARY
GLUCOSE-CAPILLARY: 99 mg/dL (ref 70–99)
Glucose-Capillary: 92 mg/dL (ref 70–99)
Glucose-Capillary: 104 mg/dL — ABNORMAL HIGH (ref 70–99)
Glucose-Capillary: 109 mg/dL — ABNORMAL HIGH (ref 70–99)
Glucose-Capillary: 107 mg/dL — ABNORMAL HIGH (ref 70–99)

## 2018-05-09 MED ORDER — ENSURE ENLIVE PO LIQD
237.0000 mL | Freq: Two times a day (BID) | ORAL | Status: DC
  Administered 2018-05-09 – 2018-05-16 (×10): 237 mL via ORAL

## 2018-05-09 MED ORDER — OSMOLITE 1.5 CAL PO LIQD
1000.0000 mL | ORAL | Status: DC
  Administered 2018-05-10 (×2): 1000 mL
  Filled 2018-05-09 (×2): qty 1000

## 2018-05-09 MED ORDER — OXYCODONE HCL 5 MG/5ML PO SOLN
5.0000 mg | ORAL | Status: DC | PRN
  Administered 2018-05-10 – 2018-05-16 (×10): 10 mg via ORAL
  Filled 2018-05-09 (×11): qty 10

## 2018-05-09 MED ORDER — PANTOPRAZOLE SODIUM 40 MG PO TBEC
40.0000 mg | DELAYED_RELEASE_TABLET | Freq: Every day | ORAL | Status: DC
  Administered 2018-05-09 – 2018-05-16 (×7): 40 mg via ORAL
  Filled 2018-05-09 (×7): qty 1

## 2018-05-09 MED ORDER — PRO-STAT SUGAR FREE PO LIQD
30.0000 mL | Freq: Every day | ORAL | Status: DC
  Administered 2018-05-09 – 2018-05-15 (×7): 30 mL
  Filled 2018-05-09 (×7): qty 30

## 2018-05-09 NOTE — [place] FL2 (Signed)
[place] MEDICAID FL2 LEVEL OF CARE SCREENING TOOL     IDENTIFICATION  Patient Name: Todd [name] Birthdate: 06/29/1961 Sex: male Admission Date (Current Location): 05/04/2018  [facility] and [place] Number:  [employer] and Address:  The . [facility], [address], [place], [place] [address]      Provider Number: [id number]  Attending Physician Name and Address:  [name], MD  Relative Name and Phone Number:  Kashyap Nuttle; father; [phone]    Current Level of Care: Hospital Recommended Level of Care: Skilled Nursing Facility Prior Approval Number:    Date Approved/Denied:   PASRR Number: [id number]  Discharge Plan: SNF    Current Diagnoses: Patient Active Problem List   Diagnosis Date Noted  . Elevated lipase 03/17/2018  . Hypotension 03/17/2018  . Leukocytosis 03/17/2018  . Gastric carcinoma (HCC) 01/23/2018  . Genetic testing 12/20/2017  . Family history of cancer   . IBS (irritable bowel syndrome) 11/28/2017  . Malignant neoplasm of stomach (HCC) 11/11/2017  . Constipation 08/30/2017  . GERD (gastroesophageal reflux disease) 08/30/2017  . Globus sensation 08/30/2017  . Cerebral artery occlusion with cerebral infarction (HCC) 12/20/2016  . PAD (peripheral artery disease) (HCC) 08/09/2014  . CAD (coronary artery disease) 07/22/2014  . Tobacco abuse 07/22/2014  . 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 Pure hypercholesterolemia 05/28/2014  . Hypertension 07/19/2012  . Hyperlipidemia 07/19/2012  . PVD (peripheral vascular disease) with claudication (HCC) 06/29/2012  . Atherosclerosis of native arteries of the extremities with intermittent claudication 04/20/2012    Orientation RESPIRATION BLADDER Height & Weight     Self, Time, Situation, Place  Normal Continent Weight: 175 lb 14.8 oz (79.8 kg) Height:  6\' 1"  (185.4 cm)  BEHAVIORAL SYMPTOMS/MOOD NEUROLOGICAL BOWEL NUTRITION STATUS      Continent  Feeding tube  AMBULATORY STATUS COMMUNICATION OF NEEDS Skin   Limited Assist Verbally Surgical wounds(incision on abdomen with gauze dressing)                       Personal Care Assistance Level of Assistance  Bathing, Feeding, Dressing Bathing Assistance: Limited assistance Feeding assistance: Limited assistance Dressing Assistance: Limited assistance     Functional Limitations Info  Sight, Hearing, Speech Sight Info: Adequate Hearing Info: Adequate Speech Info: Adequate    SPECIAL CARE FACTORS FREQUENCY  PT (By licensed PT), OT (By licensed OT)     PT Frequency: 5x week OT Frequency: 5x week            Contractures Contractures Info: Not present    Additional Factors Info  Allergies, Code Status Code Status Info: Full Code Allergies Info: POTASSIUM-CONTAINING COMPOUNDS            Current Medications (05/09/2018):  This is the current hospital active medication list Current Facility-Administered Medications  Medication Dose Route Frequency Provider Last Rate Last Dose  . dextrose 5 % and 0.45 % NaCl with KCl 20 mEq/L infusion   Intravenous Continuous [name], MD 10 mL/hr at 05/09/18 0951    . diphenhydrAMINE (BENADRYL) 12.5 MG/5ML elixir 12.5 mg  12.5 mg Oral Q6H PRN [name], MD      . enoxaparin (LOVENOX) injection 40 mg  40 mg Subcutaneous Q24H [name], MD   40 mg at 05/08/18 2256  . feeding supplement (OSMOLITE 1.5 CAL) liquid 1,000 mL  1,000 mL Per Tube Continuous [name], MD 30 mL/hr at 05/09/18 0800    . feeding supplement (  PRO-STAT SUGAR FREE 64) liquid 30 mL  30 mL Per Tube Daily [name], MD   30 mL at 05/09/18 0959  . hydrALAZINE (APRESOLINE) injection 10 mg  10 mg Intravenous Q2H PRN [name], MD      . HYDROmorphone (DILAUDID) injection 0.5-1 mg  0.5-1 mg Intravenous Q2H PRN [name], MD   1 mg at 05/09/18 1047  . methocarbamol (ROBAXIN) 500 mg in dextrose 5 % 50 mL IVPB  500 mg Intravenous Q6H PRN [name], MD 100 mL/hr at 05/09/18 1225 500 mg at 05/09/18 1225  . oxyCODONE (ROXICODONE) 5 MG/5ML solution 5-10 mg  5-10 mg Oral Q4H PRN [name], MD      . pantoprazole (PROTONIX) EC tablet 40 mg  40 mg Oral Q1200 [name], MD   40 mg at 05/09/18 0959  . prochlorperazine (COMPAZINE) tablet 10 mg  10 mg Oral Q6H PRN [name], MD       Or  . prochlorperazine (COMPAZINE) injection 5-10 mg  5-10 mg Intravenous Q6H PRN [name], MD         Discharge Medications: Please see discharge summary for a list of discharge medications.  Relevant Imaging Results:  Relevant Lab Results:   Additional Information SS# [id number] [address] [place], [place]

## 2018-05-09 NOTE — Progress Notes (Signed)
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    PT Comments    Patient progressing to gait without assistive device.  Also reviewed safety precautions and need for activity modification upon d/c.  Patient verbalized understanding.  Do not feel he will need or use RW.  Will follow acutely.   Follow Up Recommendations  Home health PT     Equipment Recommendations  None recommended by PT    Recommendations for Other Services       Precautions / Restrictions Precautions Precautions: Fall Precaution Comments: multiple lines, JP drain    Mobility  Bed Mobility   Bed Mobility: Supine to Sit     Supine to sit: Supervision;HOB elevated        Transfers Overall transfer level: Needs assistance Equipment used: None Transfers: Sit to/from Stand Sit to Stand: Min guard;Supervision         General transfer comment: assist for lines, safety  Ambulation/Gait Ambulation/Gait assistance: Min guard Gait Distance (Feet): 400 Feet Assistive device: None Gait Pattern/deviations: Step-through pattern;Decreased stride length;Drifts right/left     General Gait Details: occasional imbalance or drifting to one side in hallway, but no true LOB   Stairs             Wheelchair Mobility     Modified Rankin (Stroke Patients Only)       Balance Overall balance assessment: Needs assistance   Sitting balance-Leahy Scale: Good       Standing balance-Leahy Scale: Good                              Cognition Arousal/Alertness: Awake/alert Behavior During Therapy: WFL for tasks assessed/performed Overall Cognitive Status: Within Functional Limits for tasks assessed                                        Exercises      General Comments General comments (skin integrity, edema, etc.): educated on safety precautions with activity modification and fall prevention for home      Pertinent Vitals/Pain Pain Assessment: Faces Faces Pain Scale: Hurts little more Pain Location: abdomen Pain Descriptors / Indicators: Operative site guarding;Tender Pain Intervention(s): Monitored during session    Home Living                      Prior Function            PT Goals (current goals can now be found in the care plan section) Progress towards PT goals: Progressing toward goals    Frequency           PT Plan Equipment recommendations need to be updated;Current plan remains appropriate    Co-evaluation              AM-PAC PT "6  Clicks" Daily Activity  Outcome Measure  Difficulty turning over in bed (including adjusting bedclothes, sheets and blankets)?: A Little Difficulty moving from lying on back to sitting on the side of the bed? : A Little Difficulty sitting down on and standing up from a chair with arms (e.g., wheelchair, bedside commode, etc,.)?: A Little Help needed moving to and from a bed to chair (including a wheelchair)?: A Little Help needed walking in hospital room?: A Little Help needed climbing 3-5 steps with a railing? : A Little 6 Click Score: 18    End of Session Equipment Utilized During Treatment: Gait belt Activity Tolerance: Patient tolerated treatment well Patient left: in bed;with call  bell/phone within reach   PT Visit Diagnosis: Unsteadiness on feet (R26.81);Other abnormalities of gait and mobility (R26.89);Muscle weakness (generalized) (M62.81)     Time: 4098-1191 PT Time Calculation (min) (ACUTE ONLY): 32 min  Charges:  $Gait Training: 23-37 mins                     [place], [place] [phone] 05/09/2018    [name] 05/09/2018, 2:37 PM

## 2018-05-09 NOTE — Care Management Note (Signed)
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  PTA, pt independent, lives at home alone.                   Action/Plan: Met with pt to discuss dc planning.  He states he has a friend to assist him at home, if needed.  He is agreeable to home health care if needed at discharge.  We discussed possibility of tube feedings at home, and he prefers not to, if at all possible.  He is encouraged that he has been able to eat over the last couple of days. MD, please advise if tube feedings will be needed at home, if known.   Expected Discharge Date:                  Expected Discharge Plan:  [place]  In-House Referral:     Discharge planning Services  CM Consult  Post Acute Care Choice:    Choice offered to:     DME Arranged:    DME Agency:     HH Arranged:    [place] Agency:     Status of Service:  In process, will continue to follow  If discussed at Long Length of Stay Meetings, dates discussed:    Additional Comments:  Reinaldo Raddle, RN, BSN  Trauma/Neuro ICU Case Manager [phone]

## 2018-05-09 NOTE — Social Work (Signed)
CSW acknowledging consult for SNF placement, aware pt will have tube feeds that he will likely need support with. Will complete FL2 but not send referral to SNF until confirmed with pt.  Alexander Mt, [place] Work [phone]

## 2018-05-09 NOTE — Progress Notes (Signed)
[facility] Progress Note  5 Days Post-Op  Subjective: CC:  + BM.  Urinated after foley removal.  Pain OK.  No nausea.  No leak on upper GI.    Objective: Vital signs in last 24 hours: Temp:  [98 F (36.7 C)-98.6 F (37 C)] 98.4 F (36.9 C) (08/13 1140) Pulse Rate:  [80-92] 80 (08/13 0300) Resp:  [18-20] 20 (08/13 1140) BP: (134-154)/(90-102) 151/102 (08/13 1140) SpO2:  [97 %-100 %] 97 % (08/13 1140) Weight:  [79.8 kg] 79.8 kg (08/13 0600) Last BM Date: 05/09/18  Intake/Output from previous day: 08/12 0701 - 08/13 0700 In: 2275.6 [P.O.:660; I.V.:1110.6; NG/GT:405; IV Piggyback:100] Out: 2185 [Urine:1975; Drains:210] Intake/Output this shift: Total I/O In: 240 [P.O.:240] Out: 50 [Drains:50]  PE: Gen:  Alert, NAD, pleasant Pulm:  Normal effort Abd: soft, incision c/d/i.  Drain serosang Skin: warm and dry, no rashes  Psych: A&Ox3   Lab Results:  Recent Labs    05/08/18 0243 05/09/18 0237  WBC 8.7 8.1  HGB 7.8* 7.9*  HCT 24.5* 24.5*  PLT 201 235   BMET Recent Labs    05/08/18 0243 05/09/18 0237  NA 137 137  K 4.1 4.1  CL 105 105  CO2 25 25  GLUCOSE 95 95  BUN 9 10  CREATININE 0.92 0.95  CALCIUM 8.6* 8.6*   PT/INR No results for input(s): LABPROT, INR in the last 72 hours. CMP     Component Value Date/Time   NA 137 05/09/2018 0237   K 4.1 05/09/2018 0237   CL 105 05/09/2018 0237   CO2 25 05/09/2018 0237   GLUCOSE 95 05/09/2018 0237   BUN 10 05/09/2018 0237   CREATININE 0.95 05/09/2018 0237   CREATININE 1.17 08/08/2012 1640   CALCIUM 8.6 (L) 05/09/2018 0237   PROT 6.6 04/05/2018 1844   ALBUMIN 3.3 (L) 04/05/2018 1844   AST 19 04/05/2018 1844   ALT 13 04/05/2018 1844   ALKPHOS 66 04/05/2018 1844   BILITOT 0.3 04/05/2018 1844   GFRNONAA >60 05/09/2018 0237   GFRAA >60 05/09/2018 0237   Lipase     Component Value Date/Time   LIPASE 89 (H) 04/05/2018 1844       Studies/Results: Dg Ugi W/water Sol Cm  Result Date:  05/08/2018 CLINICAL DATA:  Total gastrectomy with esophagojejunostomy 05/04/2018 for gastric cancer. Evaluate for leak. EXAM: WATER SOLUBLE UPPER GI SERIES TECHNIQUE: Single-column upper GI series was performed using water soluble contrast. CONTRAST:  125 cc Omnipaque 300 by mouth. COMPARISON:  Preoperative CT 04/05/2018. FLUOROSCOPY TIME:  Fluoroscopy Time: 54 seconds of low-dose pulsed fluoro Radiation Exposure Index (if provided by the fluoroscopic device): 8.7 mGy Number of Acquired Spot Images: 1 scout image.  One spot image. FINDINGS: The scout abdominal radiograph demonstrates skin staples, a percutaneous jejunal feeding tube and a surgical drain. There is mild diffuse small and large bowel distension consistent with a mild postoperative ileus. The patient swallowed the water-soluble contrast without difficulty. The esophageal motility is normal. The esophagojejunal anastomosis is widely patent. There is limited opacification of the jejunal afferent loop. The efferent loop was opacified beyond the jejunal feeding tube. There is no evidence of leak of contrast. IMPRESSION: No evidence of leak or other complication following total gastrectomy and esophagojejunostomy. Electronically Signed   By: Richardean Sale M.D.   On: 05/08/2018 15:45    Anti-infectives: Anti-infectives (From admission, onward)   Start     Dose/Rate Route Frequency Ordered Stop   05/04/18 1700  ceFAZolin (ANCEF) IVPB  2g/100 mL premix     2 g 200 mL/hr over 30 Minutes Intravenous Every 8 hours 05/04/18 1645 05/04/18 1840   05/04/18 0615  ceFAZolin (ANCEF) IVPB 2g/100 mL premix     2 g 200 mL/hr over 30 Minutes Intravenous To [facility] 05/04/18 0601 05/04/18 1224       Assessment/Plan S/p total gastrectomy with feeding jejunostomy 05/04/18 Dr. Barry Dienes - POD#5 - increase tube feeds to goal.   - pain control and mobilize -switch to PO protonix Advance diet to full liquids.    ABL anemia - stable  VTE: SCDs,  lovenox for DVT prophylaxis  ID: ancef periop   LOS: 5 days    [name] , MD [facility] 05/09/2018, 1:28 PM

## 2018-05-09 NOTE — Progress Notes (Addendum)
Nutrition Follow-up  DOCUMENTATION CODES:   Not applicable  INTERVENTION:  Continue advancing tube feeds using Osmolite 1.5 formula via J-tube to goal rate of 65 ml/hr as tolerated/appropriate.  Continue 30 ml Prostat once daily  Tube feeding regimen to provide 2440 kcal (100% of needs),113 grams of protein, 1186 ml water.   Provide Ensure Enlive po BID, each supplement provides 350 kcal and 20 grams of protein.  NUTRITION DIAGNOSIS:   Increased nutrient needs related to cancer and cancer related treatments as evidenced by estimated needs; ongoing  GOAL:   Patient will meet greater than or equal to 90% of their needs; progressing  MONITOR:   Weight trends, Labs, Diet advancement, TF tolerance, I & O's  REASON FOR ASSESSMENT:   Consult Enteral/tube feeding initiation and management  ASSESSMENT:   Patient with PMH significant for gastric cancer currently on chemotherapy. Presents this admission for gastric surgery.   8/8- laparoscopy, open total gastrectomy (roux en Y esophagojejunostomy reconstruction) with feeding jejunostomy tube  NGT to suction out. Diet has been advanced to a full liquid diet. Meal completion 75%. Pt reports no abdominal pain during time of visit. Osmolite 1.5 formula infusing via J-tube at rate of 30 ml/hr. Plans to continue to advance tube feedings to goal of 65 ml/hr as tolerated/appropriate per surgery MD. Pt reports goal is to get back to obtaining adequate nutrition by mouth and discontinue tube feedings. RD to order Ensure to help maximize po intake. RD to continue to monitor.    Labs and medications reviewed.   Diet Order:   Diet Order            Diet full liquid Room service appropriate? Yes; Fluid consistency: Thin  Diet effective now              EDUCATION NEEDS:   Education needs have been addressed  Skin:  Skin Assessment: Skin Integrity Issues: Skin Integrity Issues:: Incisions Incisions: abdomen  Last BM:  8/13  Height:    Ht Readings from Last 1 Encounters:  05/07/18 6\' 1"  (1.854 m)    Weight:   Wt Readings from Last 1 Encounters:  05/09/18 79.8 kg    Ideal Body Weight:  83.6 kg  BMI:  Body mass index is 23.21 kg/m.  Estimated Nutritional Needs:   Kcal:  2350-2550 kcal  Protein:  110-120 grams  Fluid:  >/= 2.3 L/day    Corrin Parker, MS, RD, LDN Pager # [phone] After hours/ weekend pager # [phone]

## 2018-05-10 LAB — GLUCOSE, CAPILLARY
GLUCOSE-CAPILLARY: 112 mg/dL — AB (ref 70–99)
Glucose-Capillary: 115 mg/dL — ABNORMAL HIGH (ref 70–99)
Glucose-Capillary: 109 mg/dL — ABNORMAL HIGH (ref 70–99)
Glucose-Capillary: 107 mg/dL — ABNORMAL HIGH (ref 70–99)
Glucose-Capillary: 107 mg/dL — ABNORMAL HIGH (ref 70–99)
Glucose-Capillary: 101 mg/dL — ABNORMAL HIGH (ref 70–99)
Glucose-Capillary: 147 mg/dL — ABNORMAL HIGH (ref 70–99)

## 2018-05-10 MED ORDER — VALACYCLOVIR HCL 500 MG PO TABS
500.0000 mg | ORAL_TABLET | Freq: Every day | ORAL | Status: DC
  Administered 2018-05-10 – 2018-05-15 (×6): 500 mg via ORAL
  Filled 2018-05-10 (×6): qty 1

## 2018-05-10 MED ORDER — AMLODIPINE BESYLATE 10 MG PO TABS
10.0000 mg | ORAL_TABLET | Freq: Every day | ORAL | Status: DC
  Administered 2018-05-10 – 2018-05-16 (×7): 10 mg via ORAL
  Filled 2018-05-10 (×7): qty 1

## 2018-05-10 NOTE — Plan of Care (Signed)

## 2018-05-10 NOTE — Progress Notes (Addendum)
[facility] Progress Note  6 Days Post-Op  Subjective: Did well yesterday with full liquids.    Objective: Vital signs in last 24 hours: Temp:  [98.4 F (36.9 C)-98.9 F (37.2 C)] 98.6 F (37 C) (08/14 0400) Pulse Rate:  [92-94] 94 (08/13 1942) Resp:  [18-20] 20 (08/13 1140) BP: (134-151)/(87-102) 135/87 (08/14 0400) SpO2:  [97 %-100 %] 100 % (08/13 1942) Weight:  [81.8 kg] 81.8 kg (08/14 0500) Last BM Date: 05/09/18  Intake/Output from previous day: 08/13 0701 - 08/14 0700 In: 822.4 [P.O.:480; I.V.:342.4] Out: 1450 [Urine:1250; Drains:200] Intake/Output this shift: No intake/output data recorded.  PE: Gen:  Alert, NAD, pleasant Pulm:  Normal effort Abd: soft, incision c/d/i.  Drain serosang Skin: warm and dry, no rashes  Psych: A&Ox3   Lab Results:  Recent Labs    05/08/18 0243 05/09/18 0237  WBC 8.7 8.1  HGB 7.8* 7.9*  HCT 24.5* 24.5*  PLT 201 235   BMET Recent Labs    05/08/18 0243 05/09/18 0237  NA 137 137  K 4.1 4.1  CL 105 105  CO2 25 25  GLUCOSE 95 95  BUN 9 10  CREATININE 0.92 0.95  CALCIUM 8.6* 8.6*   PT/INR No results for input(s): LABPROT, INR in the last 72 hours. CMP     Component Value Date/Time   NA 137 05/09/2018 0237   K 4.1 05/09/2018 0237   CL 105 05/09/2018 0237   CO2 25 05/09/2018 0237   GLUCOSE 95 05/09/2018 0237   BUN 10 05/09/2018 0237   CREATININE 0.95 05/09/2018 0237   CREATININE 1.17 08/08/2012 1640   CALCIUM 8.6 (L) 05/09/2018 0237   PROT 6.6 04/05/2018 1844   ALBUMIN 3.3 (L) 04/05/2018 1844   AST 19 04/05/2018 1844   ALT 13 04/05/2018 1844   ALKPHOS 66 04/05/2018 1844   BILITOT 0.3 04/05/2018 1844   GFRNONAA >60 05/09/2018 0237   GFRAA >60 05/09/2018 0237   Lipase     Component Value Date/Time   LIPASE 89 (H) 04/05/2018 1844       Studies/Results: Dg Ugi W/water Sol Cm  Result Date: 05/08/2018 CLINICAL DATA:  Total gastrectomy with esophagojejunostomy 05/04/2018 for gastric cancer.  Evaluate for leak. EXAM: WATER SOLUBLE UPPER GI SERIES TECHNIQUE: Single-column upper GI series was performed using water soluble contrast. CONTRAST:  125 cc Omnipaque 300 by mouth. COMPARISON:  Preoperative CT 04/05/2018. FLUOROSCOPY TIME:  Fluoroscopy Time: 54 seconds of low-dose pulsed fluoro Radiation Exposure Index (if provided by the fluoroscopic device): 8.7 mGy Number of Acquired Spot Images: 1 scout image.  One spot image. FINDINGS: The scout abdominal radiograph demonstrates skin staples, a percutaneous jejunal feeding tube and a surgical drain. There is mild diffuse small and large bowel distension consistent with a mild postoperative ileus. The patient swallowed the water-soluble contrast without difficulty. The esophageal motility is normal. The esophagojejunal anastomosis is widely patent. There is limited opacification of the jejunal afferent loop. The efferent loop was opacified beyond the jejunal feeding tube. There is no evidence of leak of contrast. IMPRESSION: No evidence of leak or other complication following total gastrectomy and esophagojejunostomy. Electronically Signed   By: Richardean Sale M.D.   On: 05/08/2018 15:45    Anti-infectives: Anti-infectives (From admission, onward)   Start     Dose/Rate Route Frequency Ordered Stop   05/04/18 1700  ceFAZolin (ANCEF) IVPB 2g/100 mL premix     2 g 200 mL/hr over 30 Minutes Intravenous Every 8 hours 05/04/18 1645 05/04/18 1840  05/04/18 0615  ceFAZolin (ANCEF) IVPB 2g/100 mL premix     2 g 200 mL/hr over 30 Minutes Intravenous To [facility] 05/04/18 0601 05/04/18 1224       Assessment/Plan S/p total gastrectomy with feeding jejunostomy 05/04/18 Dr. Barry Dienes - POD#6 - increase tube feeds to goal. Plan cycle feeds once at goal.    - pain control and mobilize - PO protonix - Full liquids.    Patient will DEFINITELY need tube feeds at home.  It will take him many months to increase his caloric intake to goal.  Will plan to  d/c with cycled feeds.   Anticipate drain and staples being removed prior to d/c.    ABL anemia - stable  VTE: SCDs, lovenox for DVT prophylaxis HTN - restart home meds.  ID: ancef periop   LOS: 6 days    [name] , [facility] 05/10/2018, 7:22 AM

## 2018-05-10 NOTE — Progress Notes (Signed)
Nutrition Follow-up  DOCUMENTATION CODES:   Not applicable  INTERVENTION:   Once tube feeding goal rate established and tolerated, recommend transitioning to nocturnal feeds: Recommend Osmolite 1.5 formula via J-tube at rate of 85 ml/hr x 12 hours (7pm-7am) with 30 ml Prostat (or equivalent) TID.  Nocturnal tube feeding regimen to provide 1830 kcal (78% of kcal needs), 109 grams of protein (99% of protein needs), 775 ml water.   Continue Ensure Enlive po BID, each supplement provides 350 kcal and 20 grams of protein.  Encourage adequate PO intake.   NUTRITION DIAGNOSIS:   Increased nutrient needs related to cancer and cancer related treatments as evidenced by estimated needs; ongoing  GOAL:   Patient will meet greater than or equal to 90% of their needs; met via TF  MONITOR:   Weight trends, Labs, Diet advancement, TF tolerance, I & O's  REASON FOR ASSESSMENT:   Consult Enteral/tube feeding initiation and management  ASSESSMENT:   Patient with PMH significant for gastric cancer currently on chemotherapy. Presents this admission for gastric surgery.   8/8- laparoscopy, open total gastrectomy(roux en Y esophagojejunostomy reconstruction) with feeding jejunostomy tube   Pt continues on full liquid diet and has been tolerating it. Meal completion 50% today. Plans to advance diet to soft diet tomorrow. Tube feeds have been advancing to goal rate 65 ml/hr as tolerated. Per MD, plan for need for tube feeds at home as MD suspects it will take many months for pt to po consume to full goal caloric intake. Once tube feeding goal rate met and tolerated, recommend transitioning to nocturnal feeds prior to discharge to establish home tube feeding regimen. Noted, bolus tube feeds will not be appropriate as pt with J-tube. Nocturnal tube feeding recommendations have been stated above. Pt encouraged po intake at meals. RD to continue with Ensure to aid to maximize po intake.   Diet Order:    Diet Order            DIET SOFT Room service appropriate? Yes; Fluid consistency: Thin  Diet effective 0500        Diet full liquid Room service appropriate? Yes; Fluid consistency: Thin  Diet effective now              EDUCATION NEEDS:   Education needs have been addressed  Skin:  Skin Assessment: Skin Integrity Issues: Skin Integrity Issues:: Incisions Incisions: abdomen  Last BM:  8/14  Height:   Ht Readings from Last 1 Encounters:  05/07/18 6' 1"  (1.854 m)    Weight:   Wt Readings from Last 1 Encounters:  05/10/18 81.8 kg    Ideal Body Weight:  83.6 kg  BMI:  Body mass index is 23.79 kg/m.  Estimated Nutritional Needs:   Kcal:  2350-2550 kcal  Protein:  110-120 grams  Fluid:  >/= 2.3 L/day    Todd Parker, MS, RD, LDN Pager # [phone] After hours/ weekend pager # [phone]

## 2018-05-10 NOTE — Social Work (Signed)
CSW acknowledging consult, discussed pt during morning progression with bedside RN and RN Case Manager. RN Case Manager has spoken with pt, he has support from a friend at home that could potentially help with tube feeds.   Pt requesting to go home, and therapy recommendations are for HHPT. If pt does require SNF level care RN Case Manager will alert CSW, SNF placement would be dependent on pt agreement and insurance authorization.   CSW signing off. Please consult if any additional needs arise.  Alexander Mt, [place] Work [phone]

## 2018-05-11 ENCOUNTER — Other Ambulatory Visit: Payer: Self-pay

## 2018-05-11 LAB — BASIC METABOLIC PANEL
Anion gap: 8 (ref 5–15)
BUN: 10 mg/dL (ref 6–20)
CO2: 26 mmol/L (ref 22–32)
Calcium: 8.8 mg/dL — ABNORMAL LOW (ref 8.9–10.3)
Chloride: 103 mmol/L (ref 98–111)
Creatinine, Ser: 0.88 mg/dL (ref 0.61–1.24)
GFR calc Af Amer: >60 mL/min (ref >60)
GFR calc non Af Amer: >60 mL/min (ref >60)
Glucose, Bld: 123 mg/dL — ABNORMAL HIGH (ref 70–99)
Potassium: 4 mmol/L (ref 3.5–5.1)
Sodium: 137 mmol/L (ref 135–145)

## 2018-05-11 LAB — CBC
HCT: 25.3 % — ABNORMAL LOW (ref 39.0–52.0)
Hemoglobin: 7.8 g/dL — ABNORMAL LOW (ref 13.0–17.0)
MCH: 27.9 pg (ref 26.0–34.0)
MCHC: 30.8 g/dL (ref 30.0–36.0)
MCV: 90.4 fL (ref 78.0–100.0)
Platelets: 280 10*3/uL (ref 150–400)
RBC: 2.8 MIL/uL — ABNORMAL LOW (ref 4.22–5.81)
RDW: 16.6 % — ABNORMAL HIGH (ref 11.5–15.5)
WBC: 8.8 10*3/uL (ref 4.0–10.5)

## 2018-05-11 LAB — GLUCOSE, CAPILLARY
GLUCOSE-CAPILLARY: 145 mg/dL — AB (ref 70–99)
GLUCOSE-CAPILLARY: 163 mg/dL — AB (ref 70–99)
Glucose-Capillary: 138 mg/dL — ABNORMAL HIGH (ref 70–99)
Glucose-Capillary: 134 mg/dL — ABNORMAL HIGH (ref 70–99)
Glucose-Capillary: 114 mg/dL — ABNORMAL HIGH (ref 70–99)
Glucose-Capillary: 130 mg/dL — ABNORMAL HIGH (ref 70–99)

## 2018-05-11 MED ORDER — CHLORPROMAZINE HCL 25 MG/ML IJ SOLN
25.0000 mg | Freq: Four times a day (QID) | INTRAMUSCULAR | Status: DC | PRN
  Administered 2018-05-11 – 2018-05-12 (×3): 25 mg via INTRAMUSCULAR
  Filled 2018-05-11 (×4): qty 1

## 2018-05-11 MED ORDER — OSMOLITE 1.5 CAL PO LIQD
1000.0000 mL | ORAL | Status: DC
  Administered 2018-05-11: 1000 mL
  Filled 2018-05-11 (×3): qty 1000

## 2018-05-11 NOTE — Progress Notes (Signed)
Patient tolerated small amount of breakfast well. After lunch he became nauseated and vomited 100 cc of bile fluid with green broccoli.  Patient now resting well.

## 2018-05-11 NOTE — Progress Notes (Signed)
[facility] Progress Note  7 Days Post-Op  Subjective: Bloated this AM.  Has had BM and flatus, but having hiccups as well.     Objective: Vital signs in last 24 hours: Temp:  [98.4 F (36.9 C)-99.5 F (37.5 C)] 98.5 F (36.9 C) (08/15 1229) Pulse Rate:  [88-102] 102 (08/15 1229) BP: (120-148)/(74-92) 128/81 (08/15 1229) SpO2:  [94 %-97 %] 97 % (08/15 1229) Weight:  [82 kg] 82 kg (08/15 0500) Last BM Date: 05/10/18  Intake/Output from previous day: 08/14 0701 - 08/15 0700 In: 720 [P.O.:720] Out: 935 [Urine:645; Drains:290] Intake/Output this shift: Total I/O In: -  Out: 225 [Urine:225]  PE: Gen:  Alert, NAD, pleasant Pulm:  Normal effort Abd: soft, bloated.  incision c/d/i.  Drain serosang Skin: warm and dry, no rashes  Psych: A&Ox3   Lab Results:  Recent Labs    05/09/18 0237 05/11/18 0516  WBC 8.1 8.8  HGB 7.9* 7.8*  HCT 24.5* 25.3*  PLT 235 280   BMET Recent Labs    05/09/18 0237 05/11/18 0516  NA 137 137  K 4.1 4.0  CL 105 103  CO2 25 26  GLUCOSE 95 123*  BUN 10 10  CREATININE 0.95 0.88  CALCIUM 8.6* 8.8*   PT/INR No results for input(s): LABPROT, INR in the last 72 hours. CMP     Component Value Date/Time   NA 137 05/11/2018 0516   K 4.0 05/11/2018 0516   CL 103 05/11/2018 0516   CO2 26 05/11/2018 0516   GLUCOSE 123 (H) 05/11/2018 0516   BUN 10 05/11/2018 0516   CREATININE 0.88 05/11/2018 0516   CREATININE 1.17 08/08/2012 1640   CALCIUM 8.8 (L) 05/11/2018 0516   PROT 6.6 04/05/2018 1844   ALBUMIN 3.3 (L) 04/05/2018 1844   AST 19 04/05/2018 1844   ALT 13 04/05/2018 1844   ALKPHOS 66 04/05/2018 1844   BILITOT 0.3 04/05/2018 1844   GFRNONAA >60 05/11/2018 0516   GFRAA >60 05/11/2018 0516   Lipase     Component Value Date/Time   LIPASE 89 (H) 04/05/2018 1844       Studies/Results: No results found.  Anti-infectives: Anti-infectives (From admission, onward)   Start     Dose/Rate Route Frequency Ordered Stop    05/10/18 2200  valACYclovir (VALTREX) tablet 500 mg     500 mg Oral Daily at bedtime 05/10/18 0723     05/04/18 1700  ceFAZolin (ANCEF) IVPB 2g/100 mL premix     2 g 200 mL/hr over 30 Minutes Intravenous Every 8 hours 05/04/18 1645 05/04/18 1840   05/04/18 0615  ceFAZolin (ANCEF) IVPB 2g/100 mL premix     2 g 200 mL/hr over 30 Minutes Intravenous To [facility] 05/04/18 0601 05/04/18 1224       Assessment/Plan S/p total gastrectomy with feeding jejunostomy 05/04/18 Dr. Barry Dienes - POD#7   - pain control and mobilize - PO protonix - soft diet as tolerated.  Cyd Silence today.  Go back down to 50 ml/hr on tube feeds. Add thorazine.    Patient will DEFINITELY need tube feeds at home.  It will take him many months to increase his caloric intake to goal.  Will plan to d/c with cycled feeds.   Anticipate drain and staples being removed prior to d/c.    ABL anemia - stable  VTE: SCDs, lovenox for DVT prophylaxis HTN -  home meds.  ID: ancef periop   LOS: 7 days    [name] ,  MD [facility] 05/11/2018, 12:57 PM

## 2018-05-11 NOTE — Patient Outreach (Signed)
[place]) Care Management  05/11/2018  Todd [name] April 26, 1961 [id number]   Medication Adherence call to Todd [name] left a message for patient to call back patient is due on Lisinopril 40 mg. Todd [name] is showing past due under Todd [name].   Todd [name] Management Direct Dial [phone]  Fax [phone] [email or web address]

## 2018-05-11 NOTE — Progress Notes (Signed)
PT Cancellation Note  Patient Details Name: Todd [name] MRN: [id number] DOB: August 19, 1961   Cancelled Treatment:    Reason Eval/Treat Not Completed: Fatigue/lethargy limiting ability to participate.  Pt refused to ambulate, reporting he has not slept due to hiccups (none currently) all last night.  He is agreeable for PT to check back tomorrow.  Thanks,    Barbarann Ehlers. [place], [place], DPT [phone]   05/11/2018, 5:39 PM

## 2018-05-12 LAB — GLUCOSE, CAPILLARY
GLUCOSE-CAPILLARY: 132 mg/dL — AB (ref 70–99)
Glucose-Capillary: 151 mg/dL — ABNORMAL HIGH (ref 70–99)
Glucose-Capillary: 143 mg/dL — ABNORMAL HIGH (ref 70–99)
Glucose-Capillary: 123 mg/dL — ABNORMAL HIGH (ref 70–99)
Glucose-Capillary: 128 mg/dL — ABNORMAL HIGH (ref 70–99)
Glucose-Capillary: 123 mg/dL — ABNORMAL HIGH (ref 70–99)

## 2018-05-12 MED ORDER — SODIUM CHLORIDE 0.9% FLUSH: 10.0000 mL | INTRAVENOUS | Status: DC | PRN

## 2018-05-12 MED ORDER — OSMOLITE 1.5 CAL PO LIQD
1000.0000 mL | ORAL | Status: DC
  Administered 2018-05-12 – 2018-05-13 (×3): 1000 mL
  Filled 2018-05-12 (×4): qty 1000

## 2018-05-12 MED ORDER — SODIUM CHLORIDE 0.9% FLUSH
10.0000 mL | Freq: Two times a day (BID) | INTRAVENOUS | Status: DC
  Administered 2018-05-06 – 2018-05-16 (×9): 10 mL

## 2018-05-12 NOTE — Progress Notes (Signed)
[facility] Progress Note  8 Days Post-Op  Subjective: Continues to have hiccups.  Threw up yesterday after eating broccoli.    Objective: Vital signs in last 24 hours: Temp:  [97.8 F (36.6 C)-98.9 F (37.2 C)] 98.4 F (36.9 C) (08/16 0815) Pulse Rate:  [102-111] 105 (08/16 0815) BP: (99-128)/(59-81) 106/72 (08/16 0815) SpO2:  [94 %-99 %] 99 % (08/16 0815) Weight:  [82.5 kg] 82.5 kg (08/16 0600) Last BM Date: 05/11/18  Intake/Output from previous day: 08/15 0701 - 08/16 0700 In: 1108 [P.O.:480; NG/GT:628] Out: 805 [Urine:675; Emesis/NG output:100; Drains:30] Intake/Output this shift: Total I/O In: 650 [NG/GT:650] Out: 125 [Urine:125]  PE: Gen:  Alert, NAD, pleasant Pulm:  Normal effort Abd: soft, much less bloated.  incision c/d/i.   Skin: warm and dry, no rashes  Psych: A&Ox3   Lab Results:  Recent Labs    05/11/18 0516  WBC 8.8  HGB 7.8*  HCT 25.3*  PLT 280   BMET Recent Labs    05/11/18 0516  NA 137  K 4.0  CL 103  CO2 26  GLUCOSE 123*  BUN 10  CREATININE 0.88  CALCIUM 8.8*   PT/INR No results for input(s): LABPROT, INR in the last 72 hours. CMP     Component Value Date/Time   NA 137 05/11/2018 0516   K 4.0 05/11/2018 0516   CL 103 05/11/2018 0516   CO2 26 05/11/2018 0516   GLUCOSE 123 (H) 05/11/2018 0516   BUN 10 05/11/2018 0516   CREATININE 0.88 05/11/2018 0516   CREATININE 1.17 08/08/2012 1640   CALCIUM 8.8 (L) 05/11/2018 0516   PROT 6.6 04/05/2018 1844   ALBUMIN 3.3 (L) 04/05/2018 1844   AST 19 04/05/2018 1844   ALT 13 04/05/2018 1844   ALKPHOS 66 04/05/2018 1844   BILITOT 0.3 04/05/2018 1844   GFRNONAA >60 05/11/2018 0516   GFRAA >60 05/11/2018 0516   Lipase     Component Value Date/Time   LIPASE 89 (H) 04/05/2018 1844       Studies/Results: No results found.  Anti-infectives: Anti-infectives (From admission, onward)   Start     Dose/Rate Route Frequency Ordered Stop   05/10/18 2200  valACYclovir  (VALTREX) tablet 500 mg     500 mg Oral Daily at bedtime 05/10/18 0723     05/04/18 1700  ceFAZolin (ANCEF) IVPB 2g/100 mL premix     2 g 200 mL/hr over 30 Minutes Intravenous Every 8 hours 05/04/18 1645 05/04/18 1840   05/04/18 0615  ceFAZolin (ANCEF) IVPB 2g/100 mL premix     2 g 200 mL/hr over 30 Minutes Intravenous To [facility] 05/04/18 0601 05/04/18 1224       Assessment/Plan S/p total gastrectomy with feeding jejunostomy 05/04/18 Dr. Barry Dienes - POD#8   - pain control and mobilize - PO protonix -go back to full liquids.   Tube feeds to goal.  Patient will DEFINITELY need tube feeds at home.  It will take him many months to increase his caloric intake to goal.  Will plan to d/c with cycled feeds.    ABL anemia - stable  VTE: SCDs, lovenox for DVT prophylaxis HTN -  home meds.  ID: ancef periop   LOS: 8 days    [name] , [facility] 05/12/2018, 12:21 PM

## 2018-05-12 NOTE — Progress Notes (Signed)
Physical Therapy Treatment Patient Details Name: Todd [name] MRN: [id number] DOB: [birth year] Today's Date: 05/12/2018    History of Present Illness Pt is a 57 y/o male with gastric adenocarcinoma (diagnosed Jan. 2019) has received chemo for the cancer, neoadjuvant therapy; presented 8/8 with upper abdominal pain and hematemesis; now s/p diagnostic laparoscopy, open total gastrectomy with feeding jejunostomy tube on 8/8;  has a past medical history of CAD (coronary artery disease), CKD (chronic kidney disease), stage II, Family history of cancer, Gastric adenocarcinoma (HCC), History of blood transfusion, Hyperlipidemia, Hypertension, Peripheral vascular disease (HCC), Plantar fasciitis, Renal insufficiency, Stroke (HCC), and Tobacco abuse.    PT Comments    Pt making great progress with functional mobility. Pt would continue to benefit from skilled physical therapy services at this time while admitted and after d/c to address the below listed limitations in order to improve overall safety and independence with functional mobility.    Follow Up Recommendations  Home health PT     Equipment Recommendations  None recommended by PT    Recommendations for Other Services       Precautions / Restrictions Precautions Precautions: Fall Precaution Comments: multiple lines, JP drain Restrictions Weight Bearing Restrictions: No    Mobility  Bed Mobility Overal bed mobility: Modified Independent                Transfers Overall transfer level: Needs assistance Equipment used: None Transfers: Sit to/from Stand Sit to Stand: Supervision         General transfer comment: assist for lines, safety  Ambulation/Gait Ambulation/Gait assistance: Supervision Gait Distance (Feet): 800 Feet Assistive device: IV Pole Gait Pattern/deviations: Step-through pattern;Decreased stride length Gait velocity: decreased Gait velocity interpretation: 1.31 - 2.62 ft/sec, indicative of limited  community ambulator General Gait Details: pt steady with ambulation in hallway while navigating around obstacles and pushing IV pole; no LOB or need for physical assistance   Stairs             Wheelchair Mobility    Modified Rankin (Stroke Patients Only)       Balance Overall balance assessment: Needs assistance Sitting-balance support: Feet supported Sitting balance-Leahy Scale: Good     Standing balance support: No upper extremity supported Standing balance-Leahy Scale: Fair                              Cognition Arousal/Alertness: Awake/alert Behavior During Therapy: WFL for tasks assessed/performed Overall Cognitive Status: Within Functional Limits for tasks assessed                                        Exercises      General Comments        Pertinent Vitals/Pain Pain Assessment: No/denies pain    Home Living                      Prior Function            PT Goals (current goals can now be found in the care plan section) Acute Rehab PT Goals PT Goal Formulation: With patient Time For Goal Achievement: 05/21/18 Potential to Achieve Goals: Good Progress towards PT goals: Progressing toward goals    Frequency    Min 3X/week      PT Plan Current plan remains appropriate    Co-evaluation  AM-PAC PT "6 Clicks" Daily Activity  Outcome Measure  Difficulty turning over in bed (including adjusting bedclothes, sheets and blankets)?: None Difficulty moving from lying on back to sitting on the side of the bed? : None Difficulty sitting down on and standing up from a chair with arms (e.g., wheelchair, bedside commode, etc,.)?: A Little Help needed moving to and from a bed to chair (including a wheelchair)?: None Help needed walking in hospital room?: None Help needed climbing 3-5 steps with a railing? : A Little 6 Click Score: 22    End of Session   Activity Tolerance: Patient tolerated  treatment well Patient left: in bed;with call bell/phone within reach Nurse Communication: Mobility status PT Visit Diagnosis: Other abnormalities of gait and mobility (R26.89)     Time: 9604-5409 PT Time Calculation (min) (ACUTE ONLY): 18 min  Charges:  $Gait Training: 8-22 mins                     [place], [place], [place] [phone]    Todd [name] 05/12/2018, 4:26 PM

## 2018-05-12 NOTE — Care Management Note (Signed)
Case Management Note  Patient Details  Name: Todd [name] MRN: [id number] Date of Birth: [birth year]  Subjective/Objective:   57 yo male with gastric adenocarcinoma (diagnosed Jan. 2019) has received chemo for the cancer, neoadjuvant therapy; presented 8/8 with upper abdominal pain and hematemesis; now s/p diagnostic laparoscopy, open total gastrectomy with feeding jejunostomy tube on 05/04/18.  PTA, pt independent, lives at home alone.                   Action/Plan: Met with pt to discuss dc planning.  He states he has a friend to assist him at home, if needed.  He is agreeable to home health care if needed at discharge.  We discussed possibility of tube feedings at home, and he prefers not to, if at all possible.  He is encouraged that he has been able to eat over the last couple of days. MD, please advise if tube feedings will be needed at home, if known.   Expected Discharge Date:                  Expected Discharge Plan:  [place]  In-House Referral:     Discharge planning Services  CM Consult  Post Acute Care Choice:  Home Health Choice offered to:  Patient  DME Arranged:  Tube feeding, Tube feeding pump DME Agency:  [place] Arranged:  RN, PT [facility] Agency:  [place]  Status of Service:  In process, will continue to follow  If discussed at Long Length of Stay Meetings, dates discussed:    Additional Comments:  05/12/18 J. Clark Cuff, RN, BSN Met with pt to further discuss dc arrangements.  He is adamant about discharging home, and will not consider SNF.  He feels confident that he can handle home tube feedings, and is motivated to learn.  Referral to [facility] for [facility] needs and DME.  Pt needs orders for [facility] and PT with face to face documentation for Medicare.  Pt will also need orders for needed tube feeding and regimen and tube feeding pump.  Will assist as needed.   Reinaldo Raddle, RN, BSN  Trauma/Neuro ICU Case  Manager [phone]

## 2018-05-13 LAB — GLUCOSE, CAPILLARY
GLUCOSE-CAPILLARY: 122 mg/dL — AB (ref 70–99)
GLUCOSE-CAPILLARY: 124 mg/dL — AB (ref 70–99)
GLUCOSE-CAPILLARY: 83 mg/dL (ref 70–99)
Glucose-Capillary: 115 mg/dL — ABNORMAL HIGH (ref 70–99)

## 2018-05-13 MED ORDER — INSULIN ASPART 100 UNIT/ML ~~LOC~~ SOLN
0.0000 [IU] | Freq: Three times a day (TID) | SUBCUTANEOUS | Status: DC
  Administered 2018-05-13: 1 [IU] via SUBCUTANEOUS

## 2018-05-13 NOTE — Progress Notes (Signed)
9 Days Post-Op   Subjective/Chief Complaint: Wants a dressing on ABD, not eating a lot   Objective: Vital signs in last 24 hours: Temp:  [97.8 F (36.6 C)-100.3 F (37.9 C)] 97.8 F (36.6 C) (08/17 0756) Pulse Rate:  [105-120] 120 (08/17 0756) Resp:  [16] 16 (08/16 1508) BP: (97-114)/(59-86) 108/69 (08/17 0756) SpO2:  [92 %-99 %] 95 % (08/17 0756) Last BM Date: 05/13/18  Intake/Output from previous day: 08/16 0701 - 08/17 0700 In: 2233.9 [P.O.:480; NG/GT:1753.9] Out: 575 [Urine:575] Intake/Output this shift: Total I/O In: -  Out: 225 [Urine:225]  General appearance: alert and cooperative Resp: clear to auscultation bilaterally Cardio: regular rate and rhythm GI: soft, TF going, incision CDI Extremities: edema min  Lab Results:  Recent Labs    05/11/18 0516  WBC 8.8  HGB 7.8*  HCT 25.3*  PLT 280   BMET Recent Labs    05/11/18 0516  NA 137  K 4.0  CL 103  CO2 26  GLUCOSE 123*  BUN 10  CREATININE 0.88  CALCIUM 8.8*   PT/INR No results for input(s): LABPROT, INR in the last 72 hours. ABG No results for input(s): PHART, HCO3 in the last 72 hours.  Invalid input(s): PCO2, PO2  Studies/Results: No results found.  Anti-infectives: Anti-infectives (From admission, onward)   Start     Dose/Rate Route Frequency Ordered Stop   05/10/18 2200  valACYclovir (VALTREX) tablet 500 mg     500 mg Oral Daily at bedtime 05/10/18 0723     05/04/18 1700  ceFAZolin (ANCEF) IVPB 2g/100 mL premix     2 g 200 mL/hr over 30 Minutes Intravenous Every 8 hours 05/04/18 1645 05/04/18 1840   05/04/18 0615  ceFAZolin (ANCEF) IVPB 2g/100 mL premix     2 g 200 mL/hr over 30 Minutes Intravenous To [facility] 05/04/18 0601 05/04/18 1224      Assessment/Plan: S/p total gastrectomy with feeding jejunostomy 05/04/18 Dr. Barry Dienes - POD#9   - pain control and mobilize - PO protonix - full liquids.   Tube feeds to goal.  Patient will DEFINITELY need tube feeds at home.   It will take him many months to increase his caloric intake to goal.  Will plan to d/c with cycled feeds.    ABL anemia - stable  VTE: SCDs, lovenox for DVT prophylaxis HTN -  home meds.  ID: ancef periop   LOS: 9 days    Zenovia Jarred 05/13/2018

## 2018-05-13 NOTE — Progress Notes (Signed)
Patient tolerated small amounts of Full liquids at meal times though he stated he had rather have whole food but will wait untill the right time.

## 2018-05-14 LAB — BASIC METABOLIC PANEL
Anion gap: 6 (ref 5–15)
BUN: 13 mg/dL (ref 6–20)
CHLORIDE: 106 mmol/L (ref 98–111)
CO2: 26 mmol/L (ref 22–32)
CREATININE: 0.84 mg/dL (ref 0.61–1.24)
Calcium: 8.4 mg/dL — ABNORMAL LOW (ref 8.9–10.3)
GFR calc non Af Amer: >60 mL/min (ref >60)
GLUCOSE: 117 mg/dL — AB (ref 70–99)
Potassium: 3.4 mmol/L — ABNORMAL LOW (ref 3.5–5.1)
Sodium: 138 mmol/L (ref 135–145)

## 2018-05-14 LAB — GLUCOSE, CAPILLARY
GLUCOSE-CAPILLARY: 119 mg/dL — AB (ref 70–99)
GLUCOSE-CAPILLARY: 98 mg/dL (ref 70–99)
Glucose-Capillary: 134 mg/dL — ABNORMAL HIGH (ref 70–99)
Glucose-Capillary: 87 mg/dL (ref 70–99)
Glucose-Capillary: 131 mg/dL — ABNORMAL HIGH (ref 70–99)

## 2018-05-14 MED ORDER — OSMOLITE 1.5 CAL PO LIQD
1000.0000 mL | ORAL | Status: DC
  Administered 2018-05-15: 1000 mL
  Filled 2018-05-14 (×3): qty 1000

## 2018-05-14 NOTE — Progress Notes (Signed)
10 Days Post-Op   Subjective/Chief Complaint: Much less distended.  Tolerating fulls.  Tube feeds at goal.     Objective: Vital signs in last 24 hours: Temp:  [98.3 F (36.8 C)-99.3 F (37.4 C)] 98.3 F (36.8 C) (08/18 0700) Pulse Rate:  [95-111] 95 (08/18 0714) Resp:  [16] 16 (08/18 0714) BP: (105-123)/(71-85) 117/82 (08/18 0714) SpO2:  [94 %-98 %] 98 % (08/18 0714) Weight:  [77.6 kg] 77.6 kg (08/17 1133) Last BM Date: 05/14/18  Intake/Output from previous day: 08/17 0701 - 08/18 0700 In: 2940 [P.O.:1070; NG/GT:1660] Out: 925 [Urine:925] Intake/Output this shift: Total I/O In: 217.5 [P.O.:120; NG/GT:97.5] Out: -   General appearance: alert and cooperative Resp: breathign comfortably Cardio: regular rate and rhythm GI: soft, TF going, incision CDI Extremities: edema min  Lab Results:  No results for input(s): WBC, HGB, HCT, PLT in the last 72 hours. BMET Recent Labs    05/14/18 0327  NA 138  K 3.4*  CL 106  CO2 26  GLUCOSE 117*  BUN 13  CREATININE 0.84  CALCIUM 8.4*   PT/INR No results for input(s): LABPROT, INR in the last 72 hours. ABG No results for input(s): PHART, HCO3 in the last 72 hours.  Invalid input(s): PCO2, PO2  Studies/Results: No results found.  Anti-infectives: Anti-infectives (From admission, onward)   Start     Dose/Rate Route Frequency Ordered Stop   05/10/18 2200  valACYclovir (VALTREX) tablet 500 mg     500 mg Oral Daily at bedtime 05/10/18 0723     05/04/18 1700  ceFAZolin (ANCEF) IVPB 2g/100 mL premix     2 g 200 mL/hr over 30 Minutes Intravenous Every 8 hours 05/04/18 1645 05/04/18 1840   05/04/18 0615  ceFAZolin (ANCEF) IVPB 2g/100 mL premix     2 g 200 mL/hr over 30 Minutes Intravenous To [facility] 05/04/18 0601 05/04/18 1224      Assessment/Plan: S/p total gastrectomy with feeding jejunostomy 05/04/18 Dr. Barry Dienes - POD#10 - pain control and mobilize - PO protonix - full liquids.   Cycle feeds today.  Home  in next few days with cycled feeds.    Patient will DEFINITELY need tube feeds at home.  It will take him many months to increase his caloric intake to goal.  Will plan to d/c with cycled feeds.    ABL anemia - stable  VTE: SCDs, lovenox for DVT prophylaxis HTN -  home meds.  ID: ancef periop   LOS: 10 days    Todd [name] 05/14/2018

## 2018-05-15 LAB — GLUCOSE, CAPILLARY
GLUCOSE-CAPILLARY: 120 mg/dL — AB (ref 70–99)
GLUCOSE-CAPILLARY: 117 mg/dL — AB (ref 70–99)
GLUCOSE-CAPILLARY: 103 mg/dL — AB (ref 70–99)
Glucose-Capillary: 115 mg/dL — ABNORMAL HIGH (ref 70–99)
Glucose-Capillary: 120 mg/dL — ABNORMAL HIGH (ref 70–99)
Glucose-Capillary: 105 mg/dL — ABNORMAL HIGH (ref 70–99)

## 2018-05-15 MED ORDER — CHLORPROMAZINE HCL 10 MG PO TABS
10.0000 mg | ORAL_TABLET | Freq: Four times a day (QID) | ORAL | Status: DC | PRN
  Filled 2018-05-15: qty 1

## 2018-05-15 MED ORDER — PRO-STAT SUGAR FREE PO LIQD
30.0000 mL | Freq: Three times a day (TID) | ORAL | Status: DC
  Administered 2018-05-15 – 2018-05-16 (×3): 30 mL
  Filled 2018-05-15 (×3): qty 30

## 2018-05-15 MED ORDER — OSMOLITE 1.5 CAL PO LIQD
1000.0000 mL | ORAL | Status: DC
  Administered 2018-05-15: 1000 mL
  Filled 2018-05-15 (×2): qty 1000

## 2018-05-15 MED ORDER — METHOCARBAMOL 500 MG PO TABS: 500.0000 mg | ORAL_TABLET | Freq: Four times a day (QID) | ORAL | Status: DC | PRN

## 2018-05-15 NOTE — Progress Notes (Signed)
Physical Therapy Treatment Patient Details Name: Todd [name] MRN: [id number] DOB: 1960/10/31 Today's Date: 05/15/2018    History of Present Illness Pt is a 57 y/o male with gastric adenocarcinoma (diagnosed Jan. 2019) has received chemo for the cancer, neoadjuvant therapy; presented 8/8 with upper abdominal pain and hematemesis; now s/p diagnostic laparoscopy, open total gastrectomy with feeding jejunostomy tube on 8/8;  has a past medical history of CAD (coronary artery disease), CKD (chronic kidney disease), stage II, Family history of cancer, Gastric adenocarcinoma (HCC), History of blood transfusion, Hyperlipidemia, Hypertension, Peripheral vascular disease (HCC), Plantar fasciitis, Renal insufficiency, Stroke (HCC), and Tobacco abuse.    PT Comments    Patient progressing very well with mobility. Independent with ambulation and stair negotiation. Goals met, at this time, no further acute PT needs, will sign off.   Follow Up Recommendations  No PT follow up     Equipment Recommendations  None recommended by PT    Recommendations for Other Services (as ordered)     Precautions / Restrictions Precautions Precautions: Fall Precaution Comments: multiple lines, JP drain Restrictions Weight Bearing Restrictions: No    Mobility  Bed Mobility Overal bed mobility: Modified Independent                Transfers Overall transfer level: Independent Equipment used: None Transfers: Sit to/from Stand Sit to Stand: Independent         General transfer comment: no physical assist or device at this time  Ambulation/Gait Ambulation/Gait assistance: Independent Gait Distance (Feet): 810 Feet Assistive device: None Gait Pattern/deviations: Step-through pattern;Decreased stride length   Gait velocity interpretation: 1.31 - 2.62 ft/sec, indicative of limited community ambulator General Gait Details: steady with ambulation   Stairs Stairs: Yes Stairs assistance: Modified  independent (Device/Increase time) Stair Management: One rail Left Number of Stairs: 12 General stair comments: no difficulty   Wheelchair Mobility    Modified Rankin (Stroke Patients Only)       Balance Overall balance assessment: Needs assistance Sitting-balance support: Feet supported Sitting balance-Leahy Scale: Good     Standing balance support: No upper extremity supported Standing balance-Leahy Scale: Good                              Cognition Arousal/Alertness: Awake/alert Behavior During Therapy: WFL for tasks assessed/performed Overall Cognitive Status: Within Functional Limits for tasks assessed                                        Exercises      General Comments        Pertinent Vitals/Pain Pain Assessment: No/denies pain    Home Living                      Prior Function            PT Goals (current goals can now be found in the care plan section) Acute Rehab PT Goals Patient Stated Goal: hopes to get back to normal; Wants to know if and how his eating will change PT Goal Formulation: With patient Time For Goal Achievement: 05/21/18 Potential to Achieve Goals: Good Progress towards PT goals: Goals met/education completed, patient discharged from PT    Frequency    Min 3X/week      PT Plan Current plan remains appropriate    Co-evaluation  AM-PAC PT "6 Clicks" Daily Activity  Outcome Measure  Difficulty turning over in bed (including adjusting bedclothes, sheets and blankets)?: None Difficulty moving from lying on back to sitting on the side of the bed? : None Difficulty sitting down on and standing up from a chair with arms (e.g., wheelchair, bedside commode, etc,.)?: None Help needed moving to and from a bed to chair (including a wheelchair)?: None Help needed walking in hospital room?: None Help needed climbing 3-5 steps with a railing? : A Little 6 Click Score: 23     End of Session Equipment Utilized During Treatment: Gait belt Activity Tolerance: Patient tolerated treatment well Patient left: in bed;with call bell/phone within reach Nurse Communication: Mobility status PT Visit Diagnosis: Other abnormalities of gait and mobility (R26.89)     Time: 4401-0272 PT Time Calculation (min) (ACUTE ONLY): 13 min  Charges:  $Gait Training: 8-22 mins                     Charlotte Crumb, PT DPT  Board Certified Neurologic Specialist [phone]    Fabio Asa 05/15/2018, 4:05 PM

## 2018-05-15 NOTE — Progress Notes (Signed)
Staples x 18 removed from abdomen.  Area clean and dry with no signs or symptoms of infection, patient tolerated well.

## 2018-05-15 NOTE — Progress Notes (Signed)
Anticipating discharge home tomorrow on nocturnal tube feedings.  Pt lives alone, but is quite capable of being taught to do home feedings.  Notified [facility] of need for tube feeding and pump delivery; will plan to deliver DME to patient's room, as he lives alone.  He is in agreement with this plan.  Arranging for [facility] to follow up with pt tomorrow evening for first nighttime feeding initiation.  [facility] to follow with updates as available.    Reinaldo Raddle, RN, BSN  Trauma/Neuro ICU Case Manager [phone]

## 2018-05-15 NOTE — Progress Notes (Signed)
Nutrition Follow-up  DOCUMENTATION CODES:   Not applicable  INTERVENTION:   Recommended home tube feeding regimen: Osmolite 1.5 formula via J-tube at goal rate of 65 ml/hr x 16 hours (5pm-9am).  30 ml Prostat (or equivalent) TID per tube.   Tube feeding regimen to provide 1860 kcal (80% of kcal needs), 110 grams of protein (100% of protein needs), 790 ml water.   Continue Ensure Enlive po BID, each supplement provides 350 kcal and 20 grams of protein.  NUTRITION DIAGNOSIS:   Increased nutrient needs related to cancer and cancer related treatments as evidenced by estimated needs; ongoing  GOAL:   Patient will meet greater than or equal to 90% of their needs; met via TF  MONITOR:   Weight trends, Labs, Diet advancement, TF tolerance, I & O's  REASON FOR ASSESSMENT:   Consult Enteral/tube feeding initiation and management  ASSESSMENT:   Patient with PMH significant for gastric cancer currently on chemotherapy. Presents this admission for gastric surgery.   8/8- laparoscopy, open total gastrectomy(roux en Y esophagojejunostomy reconstruction) with feeding jejunostomy tube    Pt currently has Osmolite 1.5 formula infusing via J-tube at rate of 80 ml/hr x 16 hours with 30 ml Prostat once daily which is providing 2020 kcal (86% of needs), 95 grams of protein, 973 ml of water. Pt has been tolerating his po diet and tube feedings. Pt currently has soft diet ordered and has been consuming ~50% at meals. Pt has been ordering mostly full liquids at meals with some soft foods. RD consulted to modify tube feeds to provide 80% of nutritional needs via tube feeds for 16 hours/day. Plans for discharge tomorrow. RD to modify tube feeding orders. Recommended home tube feeding recommendations stated above.   Labs and medications reviewed.  Diet Order:   Diet Order            DIET DYS 3 Room service appropriate? Yes; Fluid consistency: Thin  Diet effective now              EDUCATION  NEEDS:   Education needs have been addressed  Skin:  Skin Assessment: Skin Integrity Issues: Skin Integrity Issues:: Incisions Incisions: abdomen  Last BM:  8/18  Height:   Ht Readings from Last 1 Encounters:  05/07/18 6' 1" (1.854 m)    Weight:   Wt Readings from Last 1 Encounters:  05/15/18 78.3 kg    Ideal Body Weight:  83.6 kg  BMI:  Body mass index is 22.77 kg/m.  Estimated Nutritional Needs:   Kcal:  2350-2550 kcal  Protein:  110-120 grams  Fluid:  >/= 2.3 L/day    Corrin Parker, MS, RD, LDN Pager # [phone] After hours/ weekend pager # [phone]

## 2018-05-15 NOTE — Progress Notes (Signed)
11 Days Post-Op   Subjective/Chief Complaint: Doing well.  Tolerated increased rate of feeds.      Objective: Vital signs in last 24 hours: Temp:  [98 F (36.7 C)-99 F (37.2 C)] 98.5 F (36.9 C) (08/19 0745) Pulse Rate:  [95-108] 95 (08/19 0600) Resp:  [18] 18 (08/18 2050) BP: (107-130)/(63-89) 130/89 (08/19 0600) SpO2:  [95 %-100 %] 96 % (08/19 0600) Weight:  [78.3 kg] 78.3 kg (08/19 0500) Last BM Date: 05/14/18  Intake/Output from previous day: 08/18 0701 - 08/19 0700 In: 2212.5 [P.O.:720; NG/GT:1492.5] Out: 1350 [Urine:1350] Intake/Output this shift: Total I/O In: -  Out: 200 [Urine:200]  General appearance: alert and cooperative Resp: breathing comfortably Cardio: regular rate and rhythm GI: soft, TF going, incision CDI Extremities: edema min  Lab Results:  No results for input(s): WBC, HGB, HCT, PLT in the last 72 hours. BMET Recent Labs    05/14/18 0327  NA 138  K 3.4*  CL 106  CO2 26  GLUCOSE 117*  BUN 13  CREATININE 0.84  CALCIUM 8.4*   PT/INR No results for input(s): LABPROT, INR in the last 72 hours. ABG No results for input(s): PHART, HCO3 in the last 72 hours.  Invalid input(s): PCO2, PO2  Studies/Results: No results found.  Anti-infectives: Anti-infectives (From admission, onward)   Start     Dose/Rate Route Frequency Ordered Stop   05/10/18 2200  valACYclovir (VALTREX) tablet 500 mg     500 mg Oral Daily at bedtime 05/10/18 0723     05/04/18 1700  ceFAZolin (ANCEF) IVPB 2g/100 mL premix     2 g 200 mL/hr over 30 Minutes Intravenous Every 8 hours 05/04/18 1645 05/04/18 1840   05/04/18 0615  ceFAZolin (ANCEF) IVPB 2g/100 mL premix     2 g 200 mL/hr over 30 Minutes Intravenous To [facility] 05/04/18 0601 05/04/18 1224      Assessment/Plan: S/p total gastrectomy with feeding jejunostomy 05/04/18 Dr. Barry Dienes - POD#11 - pain control and mobilize - PO protonix - full liquids. Add in some soft solids.  Discussed with him going  slow and eating small amounts of SOFT food.      Go to 80% nutritional needs via tube feeds at 16 hours per day.  Plan d/c tomorrow.    Patient will DEFINITELY need tube feeds at home.  It will take him many months to increase his caloric intake to goal.  Will plan to d/c with cycled feeds.    ABL anemia - stable  VTE: SCDs, lovenox for DVT prophylaxis HTN -  home meds.  ID: [id number] periop   LOS: 11 days    [name] 05/15/2018

## 2018-05-16 DIAGNOSIS — Z7982 Long term (current) use of aspirin: Secondary | ICD-10-CM | POA: Diagnosis not present

## 2018-05-16 DIAGNOSIS — Z934 Other artificial openings of gastrointestinal tract status: Secondary | ICD-10-CM | POA: Diagnosis not present

## 2018-05-16 DIAGNOSIS — N182 Chronic kidney disease, stage 2 (mild): Secondary | ICD-10-CM | POA: Diagnosis not present

## 2018-05-16 DIAGNOSIS — I2510 Atherosclerotic heart disease of native coronary artery without angina pectoris: Secondary | ICD-10-CM | POA: Diagnosis not present

## 2018-05-16 DIAGNOSIS — I739 Peripheral vascular disease, unspecified: Secondary | ICD-10-CM | POA: Diagnosis not present

## 2018-05-16 DIAGNOSIS — E785 Hyperlipidemia, unspecified: Secondary | ICD-10-CM | POA: Diagnosis not present

## 2018-05-16 DIAGNOSIS — C169 Malignant neoplasm of stomach, unspecified: Secondary | ICD-10-CM | POA: Diagnosis not present

## 2018-05-16 DIAGNOSIS — Z483 Aftercare following surgery for neoplasm: Secondary | ICD-10-CM | POA: Diagnosis not present

## 2018-05-16 DIAGNOSIS — K589 Irritable bowel syndrome without diarrhea: Secondary | ICD-10-CM | POA: Diagnosis not present

## 2018-05-16 DIAGNOSIS — I129 Hypertensive chronic kidney disease with stage 1 through stage 4 chronic kidney disease, or unspecified chronic kidney disease: Secondary | ICD-10-CM | POA: Diagnosis not present

## 2018-05-16 LAB — GLUCOSE, CAPILLARY: GLUCOSE-CAPILLARY: 109 mg/dL — AB (ref 70–99)

## 2018-05-16 MED ORDER — PANTOPRAZOLE SODIUM 40 MG PO TBEC: 40.0000 mg | DELAYED_RELEASE_TABLET | Freq: Every day | ORAL | 3 refills | Status: DC

## 2018-05-16 MED ORDER — OSMOLITE 1.5 CAL PO LIQD: 1000.0000 mL | ORAL | 0 refills | Status: AC

## 2018-05-16 MED ORDER — OXYCODONE HCL 5 MG/5ML PO SOLN: 5.0000 mg | ORAL | 0 refills | Status: DC | PRN

## 2018-05-16 MED ORDER — PRO-STAT SUGAR FREE PO LIQD: 30.0000 mL | Freq: Three times a day (TID) | ORAL | 0 refills | Status: DC

## 2018-05-16 MED ORDER — ENSURE ENLIVE PO LIQD: 237.0000 mL | Freq: Two times a day (BID) | ORAL | 12 refills | Status: AC

## 2018-05-16 MED ORDER — METHOCARBAMOL 500 MG PO TABS: 500.0000 mg | ORAL_TABLET | Freq: Four times a day (QID) | ORAL | 1 refills | Status: AC | PRN

## 2018-05-16 MED ORDER — CHLORPROMAZINE HCL 10 MG PO TABS: 10.0000 mg | ORAL_TABLET | Freq: Four times a day (QID) | ORAL | 1 refills | Status: DC | PRN

## 2018-05-16 NOTE — Discharge Instructions (Signed)
[facility]      [facility] °[phone] ° °ABDOMINAL SURGERY: POST OP INSTRUCTIONS ° °Always review your discharge instruction sheet given to you by the facility where your surgery was performed. ° °IF YOU HAVE DISABILITY OR FAMILY LEAVE FORMS, YOU MUST BRING THEM TO THE OFFICE FOR PROCESSING.  PLEASE DO NOT GIVE THEM TO YOUR DOCTOR. ° °1. A prescription for pain medication may be given to you upon discharge.  Take your pain medication as prescribed, if needed.  If narcotic pain medicine is not needed, then you may take acetaminophen (Tylenol) or ibuprofen (Advil) as needed. °2. Take your usually prescribed medications unless otherwise directed. °3. If you need a refill on your pain medication, please contact your pharmacy. They will contact our office to request authorization.  Prescriptions will not be filled after 5pm or on week-ends. °4. You should follow a light diet the first few days after arrival home, such as soup and crackers, pudding, etc.unless your doctor has advised otherwise. A high-fiber, low fat diet can be resumed as tolerated.   Be sure to include lots of fluids daily. Most patients will experience some swelling and bruising on the chest and neck area.  Ice packs will help.  Swelling and bruising can take several days to resolve °5. Most patients will experience some swelling and bruising in the area of the incision. Ice pack will help. Swelling and bruising can take several days to resolve..  °6. It is common to experience some constipation if taking pain medication after surgery.  Increasing fluid intake and taking a stool softener will usually help or prevent this problem from occurring.  A mild laxative (Milk of Magnesia or Miralax) should be taken according to package directions if there are no bowel movements after 48 hours. °7.  You may have steri-strips (small skin tapes) in place directly over the incision.  These strips should be left on the skin for 10-14 days.  If your  surgeon used skin glue on the incision, you may shower in 48 hours.  The glue will flake off over the next 2-3 weeks.  Any sutures or staples will be removed at the office during your follow-up visit. You may find that a light gauze bandage over your incision may keep your staples from being rubbed or pulled. You may shower and replace the bandage daily. °8. ACTIVITIES:  You may resume regular (light) daily activities beginning the next day--such as daily self-care, walking, climbing stairs--gradually increasing activities as tolerated.  You may have sexual intercourse when it is comfortable.  Refrain from any heavy lifting or straining until approved by your doctor. °a. You may drive when you no longer are taking prescription pain medication, you can comfortably wear a seatbelt, and you can safely maneuver your car and apply brakes °b. Return to Work: __________8 weeks if applicable_________________________ °9. You should see your doctor in the office for a follow-up appointment approximately two weeks after your surgery.  Make sure that you call for this appointment within a day or two after you arrive home to insure a convenient appointment time. °OTHER INSTRUCTIONS:  °_____________________________________________________________ °_____________________________________________________________ ° °WHEN TO CALL YOUR DOCTOR: °1. Fever over 101.0 °2. Inability to urinate °3. Nausea and/or vomiting °4. Extreme swelling or bruising °5. Continued bleeding from incision. °6. Increased pain, redness, or drainage from the incision. °7. Difficulty swallowing or breathing °8. Muscle cramping or spasms. °9. Numbness or tingling in hands or feet or around lips. ° °The clinic staff is   available to answer your questions during regular business hours.  Please dont hesitate to call and ask to speak to one of the nurses if you have concerns.  For further questions, please visit [email or web address]      Feeding  Tube Use Some people who have trouble swallowing or cannot take food or medicine by mouth may need a feeding tube. A feeding tube can go into the nose and down to the stomach or small bowel, or through the skin in the abdomen and into the stomach or small bowel. Liquid food (formula), breast milk, or medicines may be given through the tube. Supplies needed for a tube feeding:  Clean gloves.  Prescribed formula   Appropriate feeding bag set, gravity drip tubing set, or 30-60-mL syringe with feeding extension tubing.  Feeding tube pump or syringe pump as needed.  Pole as needed.  20-60-mL syringe to check tube placement.  A syringe to flush the feeding tube.  Container.  Water. How to give a feeding through a feeding tube pump 1. Have all supplies ready and available. 2. Wash hands well. 3. Put on clean gloves. 4. Check the placement of the feeding tube as directed. 5. Raise the head of the person 30-45 degrees or as directed. 6. Pour up to 4 hours of room-temperature feeding into the feeding bag set. 7. Hang the feeding bag set from a pole. 8. Flush the entire feeding bag set with the feeding. 9. Load the feeding bag set into the feeding tube pump. 10. Cap the feeding bag set. 11. Uncap the feeding tube. 12. Using a syringe, flush the feeding tube with water as directed. 13. Uncap the feeding bag set. 14. Connect the feeding bag set to the feeding tube. 15. Remove gloves. 16. Wash hands well. 17. Set the prescribed feeding rate. 18. Start the feeding tube pump.   Supplies needed for giving medicines through a feeding tube  60-mL syringe.  Container.  Water.  Medicine.  Pill crusher, if medicine is in tablet form.  Clean gloves. How to give medicines through a feeding tube 1. Have all supplies ready and available. 2. Wash hands well. 3. If the medicine cannot be given with the feeding, stop the feeding 60 minutes before giving the medicine. 4. If the person  needs to take medicine on an empty stomach, consider not giving a feeding for up to 2 hours (hold time) before giving medicine. Talk to the health care provider about the correct hold times. 5. Fill a container with 50-100 mL of warm water. 6. Prepare medicine that will go into the feeding tube: ? Liquid--Measure the prescribed medicine dose.. ? Capsule--Open a capsule containing dry pellets or pierce a capsule containing liquid (gelcap). Empty the contents in 30 mL of warm water or as directed. Gelcaps can also be dissolved in warm water. 7. Raise the head of the person 30-45 degrees or as directed. 8. Wash hands well. 9. Put on clean gloves. 10. If a continuous tube feeding is infusing, stop the tube feeding. 11. If applicable, close the tubing clamp. 12. Disconnect the feeding bag set, or the gravity drip tubing set, from the feeding tube. 13. Cap the feeding bag set or gravity drip tubing set. 14. Check the placement of the feeding tube as directed. 15. Draw up 30 mL of water into a syringe or as directed. 16. Insert the tip of the syringe into the feeding tube. 17. Using the syringe, flush the feeding tube with water. 18. Remove the  plunger of the syringe or replace the syringe with a syringe that contains medicine. 19. Give the medicine as directed. ? If giving only 1 dose of medicine, flush the feeding tube with water after giving the medicine. ? If giving more than 1 dose of medicine, give each separately. Flush the feeding tube between medicines and after the last dose of medicine. 20. If a tube feeding will not be continued after the medicine, cap the feeding tube. Position the person to a comfortable position, but keep his or her head raised for 1 hour after giving the medicine. 21. If a tube feeding will be continued after the medicine, but the medicine cannot be given with the feeding, continue to hold the feeding for 30-60 minutes after the medicine is given. When appropriate,  reconnect the feeding bag set, or gravity drip tubing set, to the feeding tube. 47. Throw away or clean used supplies as directed. 23. Remove gloves. 24. Wash hands well. This information is not intended to replace advice given to you by your health care provider. Make sure you discuss any questions you have with your health care provider. Document Released: 08/30/2012 Document Revised: 05/19/2016 Document Reviewed: 04/27/2012 Elsevier Interactive Patient Education  2017 [employer].

## 2018-05-16 NOTE — Progress Notes (Signed)
Reviewed discharge instructions with patient.  Patient aware [facility] will deliver tube feeding supplies and will provide teaching for tube feeds.

## 2018-05-16 NOTE — Discharge Summary (Signed)
Physician Discharge Summary  Patient ID: Todd [name] MRN: [id number] DOB/AGE: July 16, 1961 57 y.o.  Admit date: 05/04/2018 Discharge date: 05/16/2018  Admission Diagnoses: Patient Active Problem List   Diagnosis Date Noted  . Elevated lipase 03/17/2018  . Hypotension 03/17/2018  . Leukocytosis 03/17/2018  . Gastric carcinoma (HCC) 01/23/2018  . Genetic testing 12/20/2017  . Family history of cancer   . IBS (irritable bowel syndrome) 11/28/2017  . Malignant neoplasm of stomach (HCC) 11/11/2017  . Constipation 08/30/2017  . GERD (gastroesophageal reflux disease) 08/30/2017  . Globus sensation 08/30/2017  . Cerebral artery occlusion with cerebral infarction (HCC) 12/20/2016  . PAD (peripheral artery disease) (HCC) 08/09/2014  . CAD (coronary artery disease) 07/22/2014  . Tobacco abuse 07/22/2014  . 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 Pure hypercholesterolemia 05/28/2014  . Hypertension 07/19/2012  . Hyperlipidemia 07/19/2012  . PVD (peripheral vascular disease) with claudication (HCC) 06/29/2012  . 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 S/p total gastrectomy with feeding jejunostomy   Discharged Condition: stable  Hospital Course:  Pt was admitted following distal gastrectomy converted to total gastrectomy. He was placed in stepdown because of his epidural.  He was very stable.  His tube feeds were started on POD 2 at trophic levels.  NGT came out on POD 3.  On POD 4, he had an upper GI demonstrating no leak.  Clears were started.  Tube feeds were advanced to goal.  He was advanced to full liquids.  Once he tried soft diet, he had significant bloating, nausea, and hiccups.  This took several days to resolve.    He was placed back to fulls.  After several days, his tube feeds were cycled and  soft foods were tried again.  This was successful.  He was ambulatory.  He tolerated oral pain medications.  His hiccups resolved.  He is discharged to home in stable condition.      Consults: None  Significant Diagnostic Studies: labs: HCT 25.3, WBCs 8.8, Cr 0.88  Treatments: surgery: see above.    Discharge Exam: Blood pressure (!) 122/94, pulse 95, temperature 98.4 F (36.9 C), resp. rate 18, height 6\' 1"  (1.854 m), weight 78.3 kg, SpO2 96 %. General appearance: alert, cooperative and no distress Resp: breathing comfortably GI: soft, non distended.  wound c/d/i.  staples removed.   Extremities: extremities normal, atraumatic, no cyanosis or edema  Disposition: Discharge disposition: 01-Home or Self Care       Discharge Instructions    Call MD for:  persistant dizziness or light-headedness   Complete by:  As directed    Call MD for:  persistant nausea and vomiting   Complete by:  As directed    Call MD for:  redness, tenderness, or signs of infection (pain, swelling, redness, odor or green/yellow discharge around incision site)   Complete by:  As directed    Call MD for:  severe uncontrolled pain   Complete by:  As directed    Call MD for:  temperature >100.4   Complete by:  As directed    Change dressing (specify)   Complete by:  As directed    J tube care   Diet - low sodium heart healthy   Complete by:  As directed    Increase activity slowly   Complete by:  As directed  Allergies as of 05/16/2018      Reactions   Potassium-containing Compounds Swelling   Unspecified cause of Swelling of lip after taking potassium pill ? Dye used in Fillers ?      Medication List    STOP taking these medications   amLODipine 10 MG tablet Commonly known as:  NORVASC   dexamethasone 4 MG tablet Commonly known as:  DECADRON   HYDROcodone-acetaminophen 5-325 MG tablet Commonly known as:  NORCO/VICODIN   loperamide 2 MG capsule Commonly known as:  IMODIUM    ondansetron 8 MG tablet Commonly known as:  ZOFRAN   OXALIPLATIN IV   prochlorperazine 10 MG tablet Commonly known as:  COMPAZINE   rosuvastatin 10 MG tablet Commonly known as:  CRESTOR     TAKE these medications   aspirin EC 81 MG tablet Take 81 mg by mouth daily.   chlorproMAZINE 10 MG tablet Commonly known as:  THORAZINE Take 1 tablet (10 mg total) by mouth 4 (four) times daily as needed for hiccoughs.   feeding supplement (ENSURE ENLIVE) Liqd Take 237 mLs by mouth 2 (two) times daily between meals.   feeding supplement (OSMOLITE 1.5 CAL) Liqd Place 1,000 mLs into feeding tube daily.   feeding supplement (PRO-STAT SUGAR FREE 64) Liqd Place 30 mLs into feeding tube 3 (three) times daily.   methocarbamol 500 MG tablet Commonly known as:  ROBAXIN Take 1 tablet (500 mg total) by mouth every 6 (six) hours as needed for muscle spasms.   oxyCODONE 5 MG/5ML solution Commonly known as:  ROXICODONE Take 5-10 mLs (5-10 mg total) by mouth every 4 (four) hours as needed for moderate pain or severe pain.   pantoprazole 40 MG tablet Commonly known as:  PROTONIX Take 1 tablet (40 mg total) by mouth daily at 12 noon. What changed:    See the new instructions.  Another medication with the same name was removed. Continue taking this medication, and follow the directions you see here.   valACYclovir 500 MG tablet Commonly known as:  VALTREX Take 500 mg by mouth at bedtime.            Durable Medical Equipment  (From admission, onward)         Start     Ordered   05/12/18 1641  For home use only DME Tube feeding  Once    Comments:  Osmolite 1.5 formula or equivalent  via J-tube at rate of 85 ml/hr x 12 hours (7pm-7am) with 30 ml Prostat (or equivalent) TID.  Nocturnal tube feeding regimen to provide 1830 kcal (78% of kcal needs), 109 grams of protein (99% of protein needs), 775 ml water.   Continue Ensure Enlive po BID, each supplement provides 350 kcal and 20  grams of protein.    05/12/18 1641   05/12/18 1640  For home use only DME Tube feeding pump  Once     05/12/18 1641           Discharge Care Instructions  (From admission, onward)         Start     Ordered   05/16/18 0000  Change dressing (specify)    Comments:  J tube care   05/16/18 1004         Follow-up Information    [name], MD Follow up in 2 week(s).   Specialty:  General Surgery Contact information: [address] [place] [address] [phone]  SignedAlmond Lint 05/16/2018, 10:05 AM

## 2018-05-18 DIAGNOSIS — K589 Irritable bowel syndrome without diarrhea: Secondary | ICD-10-CM | POA: Diagnosis not present

## 2018-05-18 DIAGNOSIS — I739 Peripheral vascular disease, unspecified: Secondary | ICD-10-CM | POA: Diagnosis not present

## 2018-05-18 DIAGNOSIS — I2510 Atherosclerotic heart disease of native coronary artery without angina pectoris: Secondary | ICD-10-CM | POA: Diagnosis not present

## 2018-05-18 DIAGNOSIS — E785 Hyperlipidemia, unspecified: Secondary | ICD-10-CM | POA: Diagnosis not present

## 2018-05-18 DIAGNOSIS — N182 Chronic kidney disease, stage 2 (mild): Secondary | ICD-10-CM | POA: Diagnosis not present

## 2018-05-18 DIAGNOSIS — Z483 Aftercare following surgery for neoplasm: Secondary | ICD-10-CM | POA: Diagnosis not present

## 2018-05-18 DIAGNOSIS — Z7982 Long term (current) use of aspirin: Secondary | ICD-10-CM | POA: Diagnosis not present

## 2018-05-18 DIAGNOSIS — Z934 Other artificial openings of gastrointestinal tract status: Secondary | ICD-10-CM | POA: Diagnosis not present

## 2018-05-18 DIAGNOSIS — C169 Malignant neoplasm of stomach, unspecified: Secondary | ICD-10-CM | POA: Diagnosis not present

## 2018-05-18 DIAGNOSIS — I129 Hypertensive chronic kidney disease with stage 1 through stage 4 chronic kidney disease, or unspecified chronic kidney disease: Secondary | ICD-10-CM | POA: Diagnosis not present

## 2018-05-19 DIAGNOSIS — K589 Irritable bowel syndrome without diarrhea: Secondary | ICD-10-CM | POA: Diagnosis not present

## 2018-05-19 DIAGNOSIS — I129 Hypertensive chronic kidney disease with stage 1 through stage 4 chronic kidney disease, or unspecified chronic kidney disease: Secondary | ICD-10-CM | POA: Diagnosis not present

## 2018-05-19 DIAGNOSIS — N182 Chronic kidney disease, stage 2 (mild): Secondary | ICD-10-CM | POA: Diagnosis not present

## 2018-05-19 DIAGNOSIS — Z7982 Long term (current) use of aspirin: Secondary | ICD-10-CM | POA: Diagnosis not present

## 2018-05-19 DIAGNOSIS — C169 Malignant neoplasm of stomach, unspecified: Secondary | ICD-10-CM | POA: Diagnosis not present

## 2018-05-19 DIAGNOSIS — E785 Hyperlipidemia, unspecified: Secondary | ICD-10-CM | POA: Diagnosis not present

## 2018-05-19 DIAGNOSIS — Z483 Aftercare following surgery for neoplasm: Secondary | ICD-10-CM | POA: Diagnosis not present

## 2018-05-19 DIAGNOSIS — I739 Peripheral vascular disease, unspecified: Secondary | ICD-10-CM | POA: Diagnosis not present

## 2018-05-19 DIAGNOSIS — Z934 Other artificial openings of gastrointestinal tract status: Secondary | ICD-10-CM | POA: Diagnosis not present

## 2018-05-19 DIAGNOSIS — I2510 Atherosclerotic heart disease of native coronary artery without angina pectoris: Secondary | ICD-10-CM | POA: Diagnosis not present

## 2018-05-24 DIAGNOSIS — Z7982 Long term (current) use of aspirin: Secondary | ICD-10-CM | POA: Diagnosis not present

## 2018-05-24 DIAGNOSIS — I129 Hypertensive chronic kidney disease with stage 1 through stage 4 chronic kidney disease, or unspecified chronic kidney disease: Secondary | ICD-10-CM | POA: Diagnosis not present

## 2018-05-24 DIAGNOSIS — Z483 Aftercare following surgery for neoplasm: Secondary | ICD-10-CM | POA: Diagnosis not present

## 2018-05-24 DIAGNOSIS — E785 Hyperlipidemia, unspecified: Secondary | ICD-10-CM | POA: Diagnosis not present

## 2018-05-24 DIAGNOSIS — N182 Chronic kidney disease, stage 2 (mild): Secondary | ICD-10-CM | POA: Diagnosis not present

## 2018-05-24 DIAGNOSIS — K589 Irritable bowel syndrome without diarrhea: Secondary | ICD-10-CM | POA: Diagnosis not present

## 2018-05-24 DIAGNOSIS — I2510 Atherosclerotic heart disease of native coronary artery without angina pectoris: Secondary | ICD-10-CM | POA: Diagnosis not present

## 2018-05-24 DIAGNOSIS — C169 Malignant neoplasm of stomach, unspecified: Secondary | ICD-10-CM | POA: Diagnosis not present

## 2018-05-24 DIAGNOSIS — I739 Peripheral vascular disease, unspecified: Secondary | ICD-10-CM | POA: Diagnosis not present

## 2018-05-24 DIAGNOSIS — Z934 Other artificial openings of gastrointestinal tract status: Secondary | ICD-10-CM | POA: Diagnosis not present

## 2018-05-27 DIAGNOSIS — C161 Malignant neoplasm of fundus of stomach: Secondary | ICD-10-CM | POA: Diagnosis not present

## 2018-05-31 ENCOUNTER — Telehealth: Payer: Self-pay | Admitting: Nurse Practitioner

## 2018-05-31 ENCOUNTER — Ambulatory Visit (INDEPENDENT_AMBULATORY_CARE_PROVIDER_SITE_OTHER): Payer: Medicare Other | Admitting: Nurse Practitioner

## 2018-05-31 ENCOUNTER — Encounter: Payer: Self-pay | Admitting: Nurse Practitioner

## 2018-05-31 VITALS — BP 114/78 | HR 103 | Temp 98.5°F | Ht 73.0 in | Wt 172.4 lb

## 2018-05-31 DIAGNOSIS — N182 Chronic kidney disease, stage 2 (mild): Secondary | ICD-10-CM | POA: Diagnosis not present

## 2018-05-31 DIAGNOSIS — C169 Malignant neoplasm of stomach, unspecified: Secondary | ICD-10-CM | POA: Diagnosis not present

## 2018-05-31 DIAGNOSIS — I2510 Atherosclerotic heart disease of native coronary artery without angina pectoris: Secondary | ICD-10-CM | POA: Diagnosis not present

## 2018-05-31 DIAGNOSIS — E785 Hyperlipidemia, unspecified: Secondary | ICD-10-CM | POA: Diagnosis not present

## 2018-05-31 DIAGNOSIS — Z7982 Long term (current) use of aspirin: Secondary | ICD-10-CM | POA: Diagnosis not present

## 2018-05-31 DIAGNOSIS — K589 Irritable bowel syndrome without diarrhea: Secondary | ICD-10-CM | POA: Diagnosis not present

## 2018-05-31 DIAGNOSIS — I129 Hypertensive chronic kidney disease with stage 1 through stage 4 chronic kidney disease, or unspecified chronic kidney disease: Secondary | ICD-10-CM | POA: Diagnosis not present

## 2018-05-31 DIAGNOSIS — K219 Gastro-esophageal reflux disease without esophagitis: Secondary | ICD-10-CM

## 2018-05-31 DIAGNOSIS — Z934 Other artificial openings of gastrointestinal tract status: Secondary | ICD-10-CM | POA: Diagnosis not present

## 2018-05-31 DIAGNOSIS — I739 Peripheral vascular disease, unspecified: Secondary | ICD-10-CM | POA: Diagnosis not present

## 2018-05-31 DIAGNOSIS — Z483 Aftercare following surgery for neoplasm: Secondary | ICD-10-CM | POA: Diagnosis not present

## 2018-05-31 NOTE — Telephone Encounter (Signed)
Noted  

## 2018-05-31 NOTE — Telephone Encounter (Signed)
PATIENT LEFT TODAY WITHOUT BEING SEEN.  TOLD MINDY THAT HE WAS "GOING TO BOUNCE" IF HE WAS NOT SEEN SOON AND HE LEFT THE OFFICE SHORTLY AFTER THAT

## 2018-05-31 NOTE — Telephone Encounter (Signed)
Reviewed

## 2018-06-01 NOTE — Progress Notes (Signed)
Patient left without being seen.

## 2018-06-07 DIAGNOSIS — K589 Irritable bowel syndrome without diarrhea: Secondary | ICD-10-CM | POA: Diagnosis not present

## 2018-06-07 DIAGNOSIS — I2510 Atherosclerotic heart disease of native coronary artery without angina pectoris: Secondary | ICD-10-CM | POA: Diagnosis not present

## 2018-06-07 DIAGNOSIS — N182 Chronic kidney disease, stage 2 (mild): Secondary | ICD-10-CM | POA: Diagnosis not present

## 2018-06-07 DIAGNOSIS — Z934 Other artificial openings of gastrointestinal tract status: Secondary | ICD-10-CM | POA: Diagnosis not present

## 2018-06-07 DIAGNOSIS — Z7982 Long term (current) use of aspirin: Secondary | ICD-10-CM | POA: Diagnosis not present

## 2018-06-07 DIAGNOSIS — I129 Hypertensive chronic kidney disease with stage 1 through stage 4 chronic kidney disease, or unspecified chronic kidney disease: Secondary | ICD-10-CM | POA: Diagnosis not present

## 2018-06-07 DIAGNOSIS — E785 Hyperlipidemia, unspecified: Secondary | ICD-10-CM | POA: Diagnosis not present

## 2018-06-07 DIAGNOSIS — I739 Peripheral vascular disease, unspecified: Secondary | ICD-10-CM | POA: Diagnosis not present

## 2018-06-07 DIAGNOSIS — C169 Malignant neoplasm of stomach, unspecified: Secondary | ICD-10-CM | POA: Diagnosis not present

## 2018-06-07 DIAGNOSIS — Z483 Aftercare following surgery for neoplasm: Secondary | ICD-10-CM | POA: Diagnosis not present

## 2018-06-08 ENCOUNTER — Other Ambulatory Visit: Payer: Self-pay

## 2018-06-08 NOTE — Patient Outreach (Signed)
[facility]) Care Management  06/08/2018  [name] [birth year] [id number]   Medication Adherence call to Todd [name] spoke with patient he said he is no longer taking Lisinopril 40 mg doctor took him off this medication. Todd [name] is showing past due under [facility].  [place] Management Direct Dial [phone]  Fax [phone] [name]

## 2018-06-12 DIAGNOSIS — C169 Malignant neoplasm of stomach, unspecified: Secondary | ICD-10-CM | POA: Diagnosis not present

## 2018-06-13 DIAGNOSIS — C169 Malignant neoplasm of stomach, unspecified: Secondary | ICD-10-CM | POA: Diagnosis not present

## 2018-06-13 DIAGNOSIS — Z7982 Long term (current) use of aspirin: Secondary | ICD-10-CM | POA: Diagnosis not present

## 2018-06-13 DIAGNOSIS — K589 Irritable bowel syndrome without diarrhea: Secondary | ICD-10-CM | POA: Diagnosis not present

## 2018-06-13 DIAGNOSIS — Z934 Other artificial openings of gastrointestinal tract status: Secondary | ICD-10-CM | POA: Diagnosis not present

## 2018-06-13 DIAGNOSIS — N182 Chronic kidney disease, stage 2 (mild): Secondary | ICD-10-CM | POA: Diagnosis not present

## 2018-06-13 DIAGNOSIS — I129 Hypertensive chronic kidney disease with stage 1 through stage 4 chronic kidney disease, or unspecified chronic kidney disease: Secondary | ICD-10-CM | POA: Diagnosis not present

## 2018-06-13 DIAGNOSIS — Z483 Aftercare following surgery for neoplasm: Secondary | ICD-10-CM | POA: Diagnosis not present

## 2018-06-13 DIAGNOSIS — E785 Hyperlipidemia, unspecified: Secondary | ICD-10-CM | POA: Diagnosis not present

## 2018-06-13 DIAGNOSIS — I739 Peripheral vascular disease, unspecified: Secondary | ICD-10-CM | POA: Diagnosis not present

## 2018-06-13 DIAGNOSIS — I2510 Atherosclerotic heart disease of native coronary artery without angina pectoris: Secondary | ICD-10-CM | POA: Diagnosis not present

## 2018-06-14 ENCOUNTER — Encounter (HOSPITAL_COMMUNITY): Payer: Self-pay | Admitting: Internal Medicine

## 2018-06-14 ENCOUNTER — Inpatient Hospital Stay (HOSPITAL_COMMUNITY): Payer: Medicare Other

## 2018-06-14 ENCOUNTER — Inpatient Hospital Stay (HOSPITAL_COMMUNITY): Payer: Medicare Other | Attending: Internal Medicine | Admitting: Internal Medicine

## 2018-06-14 ENCOUNTER — Other Ambulatory Visit: Payer: Self-pay

## 2018-06-14 VITALS — BP 111/85 | HR 112 | Temp 98.2°F | Resp 16 | Wt 172.0 lb

## 2018-06-14 DIAGNOSIS — Z9221 Personal history of antineoplastic chemotherapy: Secondary | ICD-10-CM | POA: Insufficient documentation

## 2018-06-14 DIAGNOSIS — F17210 Nicotine dependence, cigarettes, uncomplicated: Secondary | ICD-10-CM

## 2018-06-14 DIAGNOSIS — I10 Essential (primary) hypertension: Secondary | ICD-10-CM | POA: Insufficient documentation

## 2018-06-14 DIAGNOSIS — Z7982 Long term (current) use of aspirin: Secondary | ICD-10-CM | POA: Diagnosis not present

## 2018-06-14 DIAGNOSIS — Z79899 Other long term (current) drug therapy: Secondary | ICD-10-CM | POA: Insufficient documentation

## 2018-06-14 DIAGNOSIS — Z5111 Encounter for antineoplastic chemotherapy: Secondary | ICD-10-CM | POA: Insufficient documentation

## 2018-06-14 DIAGNOSIS — C169 Malignant neoplasm of stomach, unspecified: Secondary | ICD-10-CM | POA: Insufficient documentation

## 2018-06-14 DIAGNOSIS — Z8673 Personal history of transient ischemic attack (TIA), and cerebral infarction without residual deficits: Secondary | ICD-10-CM | POA: Diagnosis not present

## 2018-06-14 DIAGNOSIS — I714 Abdominal aortic aneurysm, without rupture: Secondary | ICD-10-CM | POA: Diagnosis not present

## 2018-06-14 DIAGNOSIS — F17290 Nicotine dependence, other tobacco product, uncomplicated: Secondary | ICD-10-CM | POA: Diagnosis not present

## 2018-06-14 DIAGNOSIS — R109 Unspecified abdominal pain: Secondary | ICD-10-CM | POA: Insufficient documentation

## 2018-06-14 DIAGNOSIS — Z800 Family history of malignant neoplasm of digestive organs: Secondary | ICD-10-CM | POA: Diagnosis not present

## 2018-06-14 LAB — CBC WITH DIFFERENTIAL/PLATELET
Basophils Absolute: 0 10*3/uL (ref 0.0–0.1)
Basophils Relative: 0 %
EOS PCT: 1 %
Eosinophils Absolute: 0.1 10*3/uL (ref 0.0–0.7)
HCT: 30.6 % — ABNORMAL LOW (ref 39.0–52.0)
HEMOGLOBIN: 9.3 g/dL — AB (ref 13.0–17.0)
LYMPHS ABS: 2.8 10*3/uL (ref 0.7–4.0)
LYMPHS PCT: 28 %
MCH: 25.5 pg — AB (ref 26.0–34.0)
MCHC: 30.4 g/dL (ref 30.0–36.0)
MCV: 83.8 fL (ref 78.0–100.0)
Monocytes Absolute: 0.7 10*3/uL (ref 0.1–1.0)
Monocytes Relative: 8 %
Neutro Abs: 6.2 10*3/uL (ref 1.7–7.7)
Neutrophils Relative %: 63 %
PLATELETS: 385 10*3/uL (ref 150–400)
RBC: 3.65 MIL/uL — AB (ref 4.22–5.81)
RDW: 16.7 % — ABNORMAL HIGH (ref 11.5–15.5)
WBC: 9.8 10*3/uL (ref 4.0–10.5)

## 2018-06-14 LAB — COMPREHENSIVE METABOLIC PANEL
ALK PHOS: 61 U/L (ref 38–126)
ALT: 27 U/L (ref 0–44)
AST: 21 U/L (ref 15–41)
Albumin: 3.3 g/dL — ABNORMAL LOW (ref 3.5–5.0)
Anion gap: 8 (ref 5–15)
BILIRUBIN TOTAL: 0.3 mg/dL (ref 0.3–1.2)
BUN: 17 mg/dL (ref 6–20)
CALCIUM: 9.2 mg/dL (ref 8.9–10.3)
CO2: 26 mmol/L (ref 22–32)
CREATININE: 0.94 mg/dL (ref 0.61–1.24)
Chloride: 104 mmol/L (ref 98–111)
GFR calc non Af Amer: >60 mL/min (ref >60)
Glucose, Bld: 103 mg/dL — ABNORMAL HIGH (ref 70–99)
Potassium: 4.1 mmol/L (ref 3.5–5.1)
Sodium: 138 mmol/L (ref 135–145)
TOTAL PROTEIN: 7.3 g/dL (ref 6.5–8.1)

## 2018-06-14 LAB — FERRITIN: FERRITIN: 10 ng/mL — AB (ref 24–336)

## 2018-06-14 LAB — LACTATE DEHYDROGENASE: LDH: 115 U/L (ref 98–192)

## 2018-06-14 MED ORDER — OXYCODONE HCL 5 MG PO TABS: 5.0000 mg | ORAL_TABLET | Freq: Four times a day (QID) | ORAL | 0 refills | Status: DC | PRN

## 2018-06-14 NOTE — Patient Instructions (Signed)
[facility] Discharge Instructions     Thank you for choosing [facility] to provide your oncology and hematology care.  To afford each patient quality time with our provider, please arrive at least 15 minutes before your scheduled appointment time.   If you have a lab appointment with the Cancer Center please come in thru the  Main Entrance and check in at the main information desk  You need to re-schedule your appointment should you arrive 10 or more minutes late.  We strive to give you quality time with our providers, and arriving late affects you and other patients whose appointments are after yours.  Also, if you no show three or more times for appointments you may be dismissed from the clinic at the providers discretion.     Again, thank you for choosing [facility].  Our hope is that these requests will decrease the amount of time that you wait before being seen by our physicians.       _____________________________________________________________  Should you have questions after your visit to [facility], please contact our office at [phone] between the hours of 8:00 a.m. and 4:30 p.m.  Voicemails left after 4:00 p.m. will not be returned until the following business day.  For prescription refill requests, have your pharmacy contact our office and allow 72 hours.    Cancer Center Support Programs:   > Cancer Support Group  2nd Tuesday of the month 1pm-2pm, Journey Room    

## 2018-06-14 NOTE — Progress Notes (Signed)
1.  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  57 y.o. male initially seen by Dr. Sherrine Maples and reported he was in his usual state of health until around Christmas 2018 when he went camping in [place].  During that trip patient apparently had acute onset of epigastric pain associated with vomiting.  He reports retching with vomiting.  Sometime during that episode patient apparently attempted to put his fingers in the back of his throat to relieve the sensation of food being lodged thinking that he may have had a fish bone that was stuck in his throat.  This culminated in vomiting with small amount of blood.  He subsequently had relief from vomiting but he continued to have difficulty swallowing and hoarseness of voice.  Imaging of the neck showed posterior pharyngeal edema without evidence of any foreign object or fishbone.  The pain subsequently improved gradually.    He underwent a colonoscopy that showed tubular adenomas.  An EGD that revealed diffusely infiltrated wall of the stomach with superficial ulcerations.  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    PET scan was done 10/24/2017 and showed IMPRESSION: 1. Small focus of hypermetabolism along the lesser curvature of the antrum of the stomach, corresponding to recently diagnosed gastric neoplasm. No associated lymphadenopathy or definite findings to suggest metastatic disease to the neck, chest, abdomen or pelvis. 2. Aortic atherosclerosis, in addition to left main and 3 vessel coronary artery disease. Please note that although the presence of coronary artery calcium documents the presence of coronary artery disease, the severity of this disease and any potential stenosis cannot be assessed on this non-gated CT examination. Assessment for potential risk factor modification, dietary therapy or pharmacologic therapy may be warranted, if clinically  indicated. 3. In addition, there is fusiform aneurysmal dilatation of the infrarenal abdominal aorta which measures up to 3.9 x 3.7 cm. Recommend followup by ultrasound in 2 years. This recommendation follows ACR consensus guidelines: White Paper of the ACR Incidental Findings Committee II on Vascular Findings. J Am Coll Radiol 2013; 10:789-794.  He Is followed by Dr. Barry Dienes of surgery.  He initially indicated he did not desire surgery or chemotherapy.   Pt has diffuse gastric adenocarcinoma that appears to be localized to the stomach.  Histologically he appears to have a signet ring variety with diffuse involvement.  He was treated with neoadjuvant chemotherapy followed by surgery followed by adjuvant chemotherapy.  Previously, I have discussed with him that even if he were to be treated with surgery alone based on the diffuse nature of his cancer he was still be recommended for adjuvant therapy.    He was treated with FLOT regimen (5-FU dose at 2600 mg/m with 24 hours, oxaliplatin 85 mg/m, leucovorin 200 mg/m, and Taxotere 50 mg/m all on day 1 every 2 weeks for 4 cycles before surgery and 4 cycles following surgery.   PET scan done 10/24/2017 showed no evidence of distant disease.    EUS that was done 11/24/2017 showed:  Normal esophagus, ulcerated mass involving the lesser curvature of the stomach approximately mid body.  Mass measured 5 cm across 5 cm in length.  Mass appeared to be 7- 8 cm from the pylorus.  It was also 5 to 6 cm from the GE junction.  It appeared to pass into and through the muscularis propria as well as the serosa making it a T4 a lesion.  Mass was 1.6 cm in  maximal thickness.  No adenopathy was noted.  He was staged as a T4 a N0 by EUS.    He has undergone genetic testing that was negative.     He has completed 4 cycles of FLOT.  Pt failed to keep his follow-up to go over PET scan and was referred to Dr. Barry Dienes for surgical evaluation.  He was planned for surgery on  04/26/2018.    Pt cancelled surgery and wanted  to wait until next year for operation.  He is also reporting port problems.    I have discussed with him Pet scan done 03/20/2018 showed IMPRESSION: 1. No evidence of residual gastric carcinoma. 2. No evidence of carcinoma metastasis in the chest, abdomen, or pelvis. 3. Coronary artery calcification and Aortic Atherosclerosis (ICD10-I70.0).  Previously I discussed with him scan findings are due to chemotherapy and if he remains off treatment for a significant amount of time he has a significant risk for morbidity and mortality if he continues to be noncompliant with recommendations.  I have discussed with him he has a significant risk for death from advanced gastric cancer if he continues to defer surgery and adjuvant chemotherapy especially until next year which is what he was requesting.    After discussion he continued to defer surgery until after 04/29/2018.   Pt underwent total gastrectomy on 05/04/2018 Pathology returned as  1. Stomach, resection for tumor - INVASIVE ADENOCARCINOMA, POORLY DIFFERENTIATED, SPANNING AT LEAST 3.2 CM. - ADENOCARCINOMA IS PRESENT AT THE PROXIMAL MARGIN OF SPECIMEN 1. - LYMPHOVASCULAR INVASION IS IDENTIFIED. - PERINEURAL INVASION IS IDENTIFIED. - ADENOCARICNOMA EXTENDS THROUGH THE MUSCULARIS PROPRIA AND INTO SOFT TISSUE WITH PERITONEAL INVOLVEMENT. - METASTATIC CARCINOMA IN 6 OF 6 LYMPH NODES (6/6). - SEE ONCOLOGY TABLE BELOW. 2. Stomach, resection, Additional proximal margin - INVASIVE ADENOCARCINOMA, PRESENT AT THE PROXIMAL MARGIN OF SPECIMEN 2. - METASTATIC CARCINOMA IN 1 OF 4 LYMPH NODES (1/4). 3. Stomach, resection, additional proximal margin - INVASIVE ADENOCARCINOMA, PRESENT AT THE PROXIMAL MARGIN OF SPECIMEN 3. 4. Stomach, resection, Additional proximal margin - INVASIVE ADENOCARCINOMA, PRESENT AT THE PROXIMAL MARGIN OF SPECIMEN 4. Microscopic Comment  I discussed with the patient that due to +  margin and numerous LN he is recommended for adjuvant chemotherapy and RT.  He initially refused RT but after discussion he is willing to meet with RT for evaluation.    Based on results of CALGB study [id number] pts with resected gastric cancer who received postoperative chemoRT with ECF compared to 5FU based regimen did not show improved survival compared to 5FU before and after RT.  A recent study also showed that use of Xeloda with cisplatin followed by RT with Xeloda was better tolerated and had less toxicity than 62f.  Xeloda is comparable to 5FU with RT.  I discussed with him option of 2 cycles of Xeloda dosed at 1000 mg/m2 bid for 14 days with cisplatin 75 mg/m2 on D1 every 3 weeks followed by Xeloda with RT followed by an additional 1 or 2 cycles of XP.  XP had no difference in survival compared to 5 FU/LV.    ChemoRT demonstrated survival advantage over surgery alone in INT-0116 study.  Pt is recommended for chemo/RT.  Pt does not desire multiple days of chemotherapy.  I have discussed with him option of XP initially with Xeloda with RT.  Will discuss with Pharmacy and arrange.  Pt referred for RT evaluation.  All questions answered and He expressed understanding of the information presented.  He is set up for chemotherapy teaching and will be seen for follow-up after C1.    2.  Abdominal pain.  Pt is requesting refills on oxycodone.  RX sent to pharmacy.    3.  Hypertension.  BP 111/85.  Follow-up with PCP.  4.  Smoking.  Cessation is recommended.  PET scan showed no clear lung abnormalities.  5.  Abdominal aortic aneurysm.  This was noted on recent PET scan.  Patient was also noted to have atherosclerosis on recent PET scan.  He has been seen by vascular surgery and cardiology in the past and was on aspirin and statins.  6.  Compliance with treatment recommendations.  This has been a problem with Mr. Cross.  He has delayed surgery and had some compliance issues with follow-up.   Greater  than 30 minutes spent with more than 50% spent in counseling and coordination of care.     Current Status: He is seen today for follow-up after surgery to go over pathology.  He is complaining of abdominal pain and requesting pain medications.      Malignant neoplasm of stomach ([place])   11/11/2017 Initial Diagnosis    Malignant neoplasm of stomach ([place])    12/16/2017 Genetic Testing    Negative genetic testing on the common hereditary cancer panel.  The Hereditary Gene Panel offered by [facility] includes sequencing and/or deletion duplication testing of the following 47 genes: APC, ATM, AXIN2, BARD1, BMPR1A, BRCA1, BRCA2, BRIP1, CDH1, CDK4, CDKN2A (p14ARF), CDKN2A (p16INK4a), CHEK2, CTNNA1, DICER1, EPCAM (Deletion/duplication testing only), GREM1 (promoter region deletion/duplication testing only), KIT, MEN1, MLH1, MSH2, MSH3, MSH6, MUTYH, NBN, NF1, NHTL1, PALB2, PDGFRA, PMS2, POLD1, POLE, PTEN, RAD50, RAD51C, RAD51D, SDHB, SDHC, SDHD, SMAD4, SMARCA4. STK11, TP53, TSC1, TSC2, and VHL.  The following genes were evaluated for sequence changes only: SDHA and HOXB13 c.251G>A variant only. The report date is December 16, 2017.      Gastric carcinoma ([place])   01/23/2018 Initial Diagnosis    Gastric carcinoma ([place])    01/26/2018 -  Chemotherapy    The patient had palonosetron (ALOXI) injection 0.25 mg, 0.25 mg, Intravenous,  Once, 4 of 4 cycles Administration: 0.25 mg (01/31/2018), 0.25 mg (02/14/2018), 0.25 mg (02/28/2018), 0.25 mg (03/14/2018) pegfilgrastim (NEULASTA ONPRO KIT) injection 6 mg, 6 mg, Subcutaneous, Once, 4 of 4 cycles Administration: 6 mg (02/01/2018), 6 mg (03/01/2018) leucovorin 400 mg in dextrose 5 % 250 mL infusion, 428 mg, Intravenous,  Once, 4 of 4 cycles Administration: 400 mg (01/31/2018), 400 mg (02/14/2018), 400 mg (02/28/2018), 400 mg (03/14/2018) oxaliplatin (ELOXATIN) 180 mg in dextrose 5 % 500 mL chemo infusion, 85 mg/m2 = 180 mg, Intravenous,  Once, 4 of 4 cycles Administration: 180 mg  (01/31/2018), 180 mg (02/14/2018), 180 mg (02/28/2018), 180 mg (03/14/2018) DOCEtaxel (TAXOTERE) 110 mg in sodium chloride 0.9 % 250 mL chemo infusion, 50 mg/m2 = 110 mg, Intravenous,  Once, 4 of 4 cycles Administration: 110 mg (01/31/2018), 110 mg (02/14/2018), 110 mg (02/28/2018), 110 mg (03/14/2018) fluorouracil (ADRUCIL) 5,550 mg in sodium chloride 0.9 % 139 mL chemo infusion, 2,600 mg/m2 = 5,550 mg, Intravenous, 1 Day/Dose, 4 of 4 cycles Administration: 5,550 mg (01/31/2018), 5,550 mg (02/14/2018), 5,550 mg (02/28/2018), 5,550 mg (03/14/2018)  for chemotherapy treatment.       Problem List Patient Active Problem List   Diagnosis Date Noted  . Elevated lipase [R74.8] 03/17/2018  . Hypotension [I95.9] 03/17/2018  . Leukocytosis [D72.829] 03/17/2018  . Gastric carcinoma ([place]) [C16.9] 01/23/2018  . Genetic testing [Z13.79]  12/20/2017  . Family history of cancer [Z80.9]   . IBS (irritable bowel syndrome) [K58.9] 11/28/2017  . Malignant neoplasm of stomach ([place]) [C16.9] 11/11/2017  . Constipation [K59.00] 08/30/2017  . GERD (gastroesophageal reflux disease) [K21.9] 08/30/2017  . Globus sensation [F45.8] 08/30/2017  . Cerebral artery occlusion with cerebral infarction ([place]) [I63.50] 12/20/2016  . PAD (peripheral artery disease) ([place]) [I73.9] 08/09/2014  . CAD (coronary artery disease) [I25.10] 07/22/2014  . Tobacco abuse [Z72.0] 07/22/2014  . 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 Pure hypercholesterolemia [E78.00] 05/28/2014  . Hypertension [I10] 07/19/2012  . Hyperlipidemia [E78.5] 07/19/2012  . PVD (peripheral vascular disease) with claudication ([place]) [I73.9] 06/29/2012  . Atherosclerosis of native arteries of the extremities with intermittent claudication [I70.219] 04/20/2012    Past Medical History Past Medical History:  Diagnosis Date  . CAD (coronary artery disease)    a. Cath 07/2012 following abnormal nuclear stress test: 20-30% LAD, 20-30% OM1, occluded branch off  OM2 that fills slowly via collaterals, small nondominant RCA occluded proximally, fills slowly via collaterals.  . CKD (chronic kidney disease), stage II   . Family history of cancer   . Gastric adenocarcinoma ([place])   . History of blood transfusion    "when I was a child"  . Hyperlipidemia   . Hypertension   . Peripheral vascular disease ([place])    a. bilateral superficial femoral artery stents in May/August of 2012. b. Left femoral to above-knee popliteal bypass in 08/2012.  Marland Kitchen Plantar fasciitis   . Renal insufficiency   . Stroke [facility])    2008 right side weakness  . Tobacco abuse     Past Surgical History Past Surgical History:  Procedure Laterality Date  . ABDOMINAL AORTAGRAM N/A 04/27/2012   Procedure: ABDOMINAL Maxcine Ham;  Surgeon: Conrad Beaver Meadows, MD;  Location: [facility] CATH LAB;  Service: Cardiovascular;  Laterality: N/A;  . ABDOMINAL AORTAGRAM N/A 08/09/2014   Procedure: ABDOMINAL Maxcine Ham;  Surgeon: Elam Dutch, MD;  Location[facility] CATH LAB;  Service: Cardiovascular;  Laterality: N/A;  . BIOPSY  10/06/2017   Procedure: BIOPSY;  Surgeon: Daneil Dolin, MD;  [facility] ENDO SUITE;  Service: Endoscopy;;  gastric   . CARDIAC CATHETERIZATION    . COLONOSCOPY    . COLONOSCOPY WITH PROPOFOL N/A 10/06/2017   Procedure: COLONOSCOPY WITH PROPOFOL;  Surgeon: Daneil Dolin, MD;  [facility] ENDO SU[facility];  Service: Endoscopy;  Laterality: N/A;  1:45pm - patient can't come earlier due to transportation  . ESOPHAGOGASTRODUODENOSCOPY (EGD) WITH PROPOFOL N/A 10/06/2017   Procedure: ESOPHAGOGASTRODUODENOSCOPY (EGD) WITH PROPOFOL;  Surgeon: Daneil Dolin, MD;  [facility] END[facility];  Service: Endoscopy;  Laterality: N/A;  . EUS N/A 11/24/2017   Procedure: UPPER ENDOSCOPIC ULTRASOUND (EUS) RADIAL;  Surgeon: Milus Banister, MD;  Location: [facility] ENDOSCOPY;  Service: Endoscopy;  Laterality: N/A;  . FEMORAL ARTERY - POPLITEAL ARTERY BYPASS GRAFT    . FEMORAL-POPLITEAL BYPASS GRAFT  09/26/2012    Procedure: BYPASS GRAFT FEMORAL-POPLITEAL ARTERY;  Surgeon: Elam Dutch, MD;  Location: [facility] OR;  Service: Vascular;  Laterality: Left;  using 89m Gore-Tex Propaten Ringed Graft, Vein patch on the proximal and distal anastomosis  . FEMORAL-POPLITEAL BYPASS GRAFT Right 08/12/2014   Procedure:  FEMORAL-POPLITEAL ARTERY BYPASS WITH SAPHENOUS VEIN GRAFT, INTRAOPERATIVE ARTERIOGRAM;  Surgeon: CElam Dutch MD;  [place]  Service: Vascular;  Laterality: Right;  . GASTRECTOMY N/A 05/04/2018   Procedure: Total Gastrectomy with Feeding Jejunostomy;  Surgeon: BStark Klein MD;  Location:  [facility] OR;  Service: General;  Laterality: N/A;  . INTRAOPERATIVE ARTERIOGRAM  08/12/2014   Procedure: INTRA OPERATIVE ARTERIOGRAM;  Surgeon: Elam Dutch, MD;  Location: [place];  Service: Vascular;;  . LAPAROSCOPY N/A 05/04/2018   Procedure: LAPAROSCOPY DIAGNOSTIC ERAS PATHWAY;  Surgeon: [name], MD;  Location: [place];  Service: General;  Laterality: N/A;  EPIDURAL  . POLYPECTOMY  10/06/2017   Procedure: POLYPECTOMY;  Surgeon: Daneil Dolin, MD;  [facility];  Service: Endoscopy;;  splenic flexure,  ascending  . PORTACATH PLACEMENT N/A 01/24/2018   Procedure: INSERTION PORT-A-CATH;  Surgeon: [name], MD;  Location: [place];  Service: General;  Laterality: N/A;  . PR VEIN BYPASS GRAFT,AORTO-FEM-POP    . stents in bil legs      Family History Family History  Problem Relation Age of Onset  . Diabetes Mother   . Heart disease Mother        before age 23- Triple BPG  . Cancer Mother        Luekemia  . Deep vein thrombosis Mother   . Hyperlipidemia Mother   . Hypertension Mother   . Heart attack Mother   . Peripheral vascular disease Mother   . Stroke Mother        x 3  . Pancreatic cancer Mother   . Arthritis Father   . Hypertension Father   . Kidney disease Father   . Cancer Maternal Grandmother        NOS  . Diabetes Cousin   . Cancer Cousin        NOS, pat first cousin  .  Cancer Cousin        NOS, pat first cousin  . Colon cancer Neg Hx   . Gastric cancer Neg Hx   . Esophageal cancer Neg Hx      Social History  reports that he has been smoking cigars. He started smoking about 43 years ago. He has a 25.00 pack-year smoking history. He has never used smokeless tobacco. He reports that he has current or past drug history. Drug: Marijuana. He reports that he does not drink alcohol.  Medications  Current Outpatient Medications:  .  Amino Acids-Protein Hydrolys (FEEDING SUPPLEMENT, PRO-STAT SUGAR FREE 64,) LIQD, Place 30 mLs into feeding tube 3 (three) times daily., Disp: 900 mL, Rfl: 0 .  aspirin EC 81 MG tablet, Take 81 mg by mouth daily., Disp: , Rfl:  .  chlorproMAZINE (THORAZINE) 10 MG tablet, Take 1 tablet (10 mg total) by mouth 4 (four) times daily as needed for hiccoughs., Disp: 30 tablet, Rfl: 1 .  feeding supplement, ENSURE ENLIVE, (ENSURE ENLIVE) LIQD, Take 237 mLs by mouth 2 (two) times daily between meals., Disp: 237 mL, Rfl: 12 .  methocarbamol (ROBAXIN) 500 MG tablet, Take 1 tablet (500 mg total) by mouth every 6 (six) hours as needed for muscle spasms., Disp: 20 tablet, Rfl: 1 .  Nutritional Supplements (FEEDING SUPPLEMENT, OSMOLITE 1.5 CAL,) LIQD, Place 1,000 mLs into feeding tube daily., Disp: 30 Bottle, Rfl: 0 .  nystatin (MYCOSTATIN) 100000 UNIT/ML suspension, TAKE 5 ML BY MOUTH THREE TIMES DAILY, Disp: , Rfl: 0 .  oxyCODONE (OXY IR/ROXICODONE) 5 MG immediate release tablet, Take 1 tablet (5 mg total) by mouth every 6 (six) hours as needed., Disp: 60 tablet, Rfl: 0 .  oxyCODONE (ROXICODONE) 5 MG/5ML solution, Take 5-10 mLs (5-10 mg total) by mouth every 4 (four) hours as needed for moderate pain or severe pain., Disp: 473  mL, Rfl: 0 .  pantoprazole (PROTONIX) 40 MG tablet, Take 1 tablet (40 mg total) by mouth daily at 12 noon., Disp: 30 tablet, Rfl: 3 .  valACYclovir (VALTREX) 500 MG tablet, Take 500 mg by mouth at bedtime., Disp: , Rfl:    Allergies Potassium-containing compounds  Review of Systems Review of Systems - Oncology ROS negative other than abdominal pain   Physical Exam  Vitals Wt Readings from Last 3 Encounters:  06/14/18 172 lb (78 kg)  05/31/18 172 lb 6.4 oz (78.2 kg)  05/15/18 172 lb 9.9 oz (78.3 kg)   Temp Readings from Last 3 Encounters:  06/14/18 98.2 F (36.8 C) (Oral)  05/31/18 98.5 F (36.9 C) (Oral)  05/16/18 98.4 F (36.9 C)   BP Readings from Last 3 Encounters:  06/14/18 111/85  05/31/18 114/78  05/16/18 (!) 122/94   Pulse Readings from Last 3 Encounters:  06/14/18 (!) 112  05/31/18 (!) 103  05/15/18 95    Constitutional: Well-developed, well-nourished, and in no distress.   HENT: Head: Normocephalic and atraumatic.  Mouth/Throat: No oropharyngeal exudate. Mucosa moist. Eyes: Pupils are equal, round, and reactive to light. Conjunctivae are normal. No scleral icterus.  Neck: Normal range of motion. Neck supple. No JVD present.  Cardiovascular: Normal rate, regular rhythm and normal heart sounds.  Exam reveals no gallop and no friction rub.   No murmur heard. Pulmonary/Chest: Effort normal and breath sounds normal. No respiratory distress. No wheezes.No rales.  Abdominal: Soft. Bowel sounds are normal. No distension. Surgical scar healed.  Feeding tube noted on left.   Musculoskeletal: No edema or tenderness.  Lymphadenopathy: No cervical or supraclavicular adenopathy.  Neurological: Alert and oriented to person, place, and time. No cranial nerve deficit.  Skin: Skin is warm and dry. No rash noted. No erythema. No pallor.  Psychiatric: Affect and judgment normal.   Labs Appointment on 06/14/2018  Component Date Value Ref Range Status  . WBC 06/14/2018 9.8  4.0 - 10.5 K/uL Final  . RBC 06/14/2018 3.65* 4.22 - 5.81 MIL/uL Final  . Hemoglobin 06/14/2018 9.3* 13.0 - 17.0 g/dL Final  . HCT 06/14/2018 30.6* 39.0 - 52.0 % Final  . MCV 06/14/2018 83.8  78.0 - 100.0 fL Final  .  MCH 06/14/2018 25.5* 26.0 - 34.0 pg Final  . MCHC 06/14/2018 30.4  30.0 - 36.0 g/dL Final  . RDW 06/14/2018 16.7* 11.5 - 15.5 % Final  . Platelets 06/14/2018 385  150 - 400 K/uL Final  . Neutrophils Relative % 06/14/2018 63  % Final  . Neutro Abs 06/14/2018 6.2  1.7 - 7.7 K/uL Final  . Lymphocytes Relative 06/14/2018 28  % Final  . Lymphs Abs 06/14/2018 2.8  0.7 - 4.0 K/uL Final  . Monocytes Relative 06/14/2018 8  % Final  . Monocytes Absolute 06/14/2018 0.7  0.1 - 1.0 K/uL Final  . Eosinophils Relative 06/14/2018 1  % Final  . Eosinophils Absolute 06/14/2018 0.1  0.0 - 0.7 K/uL Final  . Basophils Relative 06/14/2018 0  % Final  . Basophils Absolute 06/14/2018 0.0  0.0 - 0.1 K/uL Final   Performed at [facility], [address], [place], [place] [address]  . Sodium 06/14/2018 138  135 - 145 mmol/L Final  . Potassium 06/14/2018 4.1  3.5 - 5.1 mmol/L Final  . Chloride 06/14/2018 104  98 - 111 mmol/L Final  . CO2 06/14/2018 26  22 - 32 mmol/L Final  . Glucose, Bld 06/14/2018 103* 70 - 99 mg/dL Final  .  BUN 06/14/2018 17  6 - 20 mg/dL Final  . Creatinine, Ser 06/14/2018 0.94  0.61 - 1.24 mg/dL Final  . Calcium 06/14/2018 9.2  8.9 - 10.3 mg/dL Final  . Total Protein 06/14/2018 7.3  6.5 - 8.1 g/dL Final  . Albumin 06/14/2018 3.3* 3.5 - 5.0 g/dL Final  . AST 06/14/2018 21  15 - 41 U/L Final  . ALT 06/14/2018 27  0 - 44 U/L Final  . Alkaline Phosphatase 06/14/2018 61  38 - 126 U/L Final  . Total Bilirubin 06/14/2018 0.3  0.3 - 1.2 mg/dL Final  . GFR calc non Af Amer 06/14/2018 >60  >60 mL/min Final  . GFR calc Af Amer 06/14/2018 >60  >60 mL/min Final   Comment: (NOTE) The eGFR has been calculated using the CKD EPI equation. This calculation has not been validated in all clinical situations. eGFR's persistently <60 mL/min signify possible Chronic Kidney Disease.   Georgiann Hahn gap 06/14/2018 8  5 - 15 Final   Performed at [facility], [address] [place], [place] [address]  . LDH  06/14/2018 115  98 - 192 U/L Final   Performed at [facility], [address] [place], [place] [address]  . Ferritin 06/14/2018 10* 24 - 336 ng/mL Final   Performed at [facility], [address], [place], [place] [address]     Pathology Orders Placed This Encounter  Procedures  . CBC with Differential/Platelet    Standing Status:   Future    Number of Occurrences:   1    Standing Expiration Date:   06/15/2019  . Comprehensive metabolic panel    Standing Status:   Future    Number of Occurrences:   1    Standing Expiration Date:   06/15/2019  . Lactate dehydrogenase    Standing Status:   Future    Number of Occurrences:   1    Standing Expiration Date:   06/15/2019  . Ferritin    Standing Status:   Future    Number of Occurrences:   1    Standing Expiration Date:   06/15/2019  . CEA    Standing Status:   Future    Number of Occurrences:   1    Standing Expiration Date:   06/15/2019       Zoila Shutter MD

## 2018-06-15 ENCOUNTER — Encounter (HOSPITAL_COMMUNITY): Payer: Self-pay | Admitting: Lab

## 2018-06-15 ENCOUNTER — Emergency Department (HOSPITAL_COMMUNITY): Payer: Medicare Other

## 2018-06-15 ENCOUNTER — Other Ambulatory Visit: Payer: Self-pay

## 2018-06-15 ENCOUNTER — Encounter (HOSPITAL_COMMUNITY): Payer: Self-pay

## 2018-06-15 ENCOUNTER — Emergency Department (HOSPITAL_COMMUNITY)
Admission: EM | Admit: 2018-06-15 | Discharge: 2018-06-15 | Disposition: A | Discharge status: Home / Self Care | Payer: Medicare Other | Attending: Emergency Medicine | Admitting: Emergency Medicine

## 2018-06-15 DIAGNOSIS — I129 Hypertensive chronic kidney disease with stage 1 through stage 4 chronic kidney disease, or unspecified chronic kidney disease: Secondary | ICD-10-CM | POA: Insufficient documentation

## 2018-06-15 DIAGNOSIS — Z85028 Personal history of other malignant neoplasm of stomach: Secondary | ICD-10-CM | POA: Diagnosis not present

## 2018-06-15 DIAGNOSIS — R112 Nausea with vomiting, unspecified: Secondary | ICD-10-CM | POA: Diagnosis not present

## 2018-06-15 DIAGNOSIS — F17290 Nicotine dependence, other tobacco product, uncomplicated: Secondary | ICD-10-CM | POA: Diagnosis not present

## 2018-06-15 DIAGNOSIS — Z79899 Other long term (current) drug therapy: Secondary | ICD-10-CM | POA: Insufficient documentation

## 2018-06-15 DIAGNOSIS — R0989 Other specified symptoms and signs involving the circulatory and respiratory systems: Secondary | ICD-10-CM | POA: Diagnosis not present

## 2018-06-15 DIAGNOSIS — Z7982 Long term (current) use of aspirin: Secondary | ICD-10-CM | POA: Insufficient documentation

## 2018-06-15 DIAGNOSIS — F458 Other somatoform disorders: Secondary | ICD-10-CM | POA: Diagnosis not present

## 2018-06-15 DIAGNOSIS — I2510 Atherosclerotic heart disease of native coronary artery without angina pectoris: Secondary | ICD-10-CM | POA: Diagnosis not present

## 2018-06-15 DIAGNOSIS — N182 Chronic kidney disease, stage 2 (mild): Secondary | ICD-10-CM | POA: Insufficient documentation

## 2018-06-15 LAB — CEA: CEA: 5.8 ng/mL — ABNORMAL HIGH (ref 0.0–4.7)

## 2018-06-15 MED ORDER — ONDANSETRON 4 MG PO TBDP
4.0000 mg | ORAL_TABLET | Freq: Once | ORAL | Status: AC
  Administered 2018-06-15: 4 mg via ORAL
  Filled 2018-06-15: qty 1

## 2018-06-15 NOTE — ED Notes (Signed)
Tolerating po fluid challenge well.

## 2018-06-15 NOTE — ED Notes (Signed)
Pt left before discharge papers completed.

## 2018-06-15 NOTE — Discharge Instructions (Addendum)
You were seen in the emergency department due to concerns of having swallowed your dentures.  The x-ray of your neck, chest, and abdomen did not show any significant changes in your airway.  Without changes we feel that it is somewhat unlikely for you to have swallowed something that large.  Given you are able to tolerate fluids in the emergency department we would like you to follow-up closely with your gastroenterologist within the next 48 hours For possible further evaluation.  Should you start to experience trouble swallowing fluids, trouble breathing, or any other concerns we recommend that you return to the ER immediately.

## 2018-06-15 NOTE — ED Triage Notes (Signed)
Pt thinks he swallowed upper dentures last week. Pt feels like there is something in his esophagus. Pt denies SOB. Slight difficult with fluid intake.

## 2018-06-15 NOTE — Progress Notes (Unsigned)
Referral sent to [facility]. Records faxed on 9/19

## 2018-06-15 NOTE — ED Provider Notes (Signed)
[facility] EMERGENCY DEPARTMENT Provider Note   CSN: [id number] Arrival date & time: 06/15/18  0801     History   Chief Complaint Chief Complaint  Patient presents with  . Swallowed Foreign Body    HPI Todd [name] is a 57 y.o. male with a hx of tobacco abuse, CAD, stage II CKD, gastric adenocarcinoma, IBS, HTN, hyperlipidemia, prior stroke, PAD, and prior globus sensation who presents to the ED with concerns of swallowing FB in the middle of the night light night. Patient states that he wears a full set of upper dentures with partial lower dentures. He went to sleep last night with his dentures in place and woke up this AM without the top set in. He states he is concerned he swallowed the top set as when he woke up he had a foreign body sensation to the suprasternal notch area and felt nauseous. He states he attempted to drink some water and was able to get some of it down, but did have a bit of non bloody emesis following. Otherwise no specific alleviating/aggravating factors. He did not see the dentures on the couch that he slept on. Denies dyspnea, chest pain, abdominal pain, diarrhea, or blood in stool. Patient with hx of gastric adenocarcinoma- recently underwent distal gastrectomy converted to total gastrectomy with jejunostomy feeding tube in place with discharge from hospital 05/04/18. He has previously seen [facility].   HPI  Past Medical History:  Diagnosis Date  . CAD (coronary artery disease)    a. Cath 07/2012 following abnormal nuclear stress test: 20-30% LAD, 20-30% OM1, occluded branch off OM2 that fills slowly via collaterals, small nondominant RCA occluded proximally, fills slowly via collaterals.  . CKD (chronic kidney disease), stage II   . Family history of cancer   . Gastric adenocarcinoma ([place])   . History of blood transfusion    "when I was a child"  . Hyperlipidemia   . Hypertension   . Peripheral vascular disease ([place])    a.  bilateral superficial femoral artery stents in May/August of 2012. b. Left femoral to above-knee popliteal bypass in 08/2012.  Marland Kitchen Plantar fasciitis   . Renal insufficiency   . Stroke [facility])    2008 right side weakness  . Tobacco abuse     Patient Active Problem List   Diagnosis Date Noted  . Elevated lipase 03/17/2018  . Hypotension 03/17/2018  . Leukocytosis 03/17/2018  . Gastric carcinoma ([place]) 01/23/2018  . Genetic testing 12/20/2017  . Family history of cancer   . IBS (irritable bowel syndrome) 11/28/2017  . Malignant neoplasm of stomach ([place]) 11/11/2017  . Constipation 08/30/2017  . GERD (gastroesophageal reflux disease) 08/30/2017  . Globus sensation 08/30/2017  . Cerebral artery occlusion with cerebral infarction ([place]) 12/20/2016  . PAD (peripheral artery disease) ([place]) 08/09/2014  . CAD (coronary artery disease) 07/22/2014  . Tobacco abuse 07/22/2014  . 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 Pure hypercholesterolemia 05/28/2014  . Hypertension 07/19/2012  . Hyperlipidemia 07/19/2012  . PVD (peripheral vascular disease) with claudication ([place]) 06/29/2012  . Atherosclerosis of native arteries of the extremities with intermittent claudication 04/20/2012    Past Surgical History:  Procedure Laterality Date  . ABDOMINAL AORTAGRAM N/A 04/27/2012   Procedure: ABDOMINAL Maxcine Ham;  Surgeon: Conrad Snow Hill, MD;  Location: [facility] CATH LAB;  Service: Cardiovascular;  Laterality: N/A;  . ABDOMINAL AORTAGRAM N/A 08/09/2014   Procedure: ABDOMINAL Maxcine Ham;  Surgeon: Elam Dutch, MD;  Location: [facility] CATH LAB;  Service: Cardiovascular;  Laterality: N/A;  . BIOPSY  10/06/2017   Procedure: BIOPSY;  Surgeon: Daneil Dolin, MD;  Location: [facility] ENDO SUITE;  Service: Endoscopy;;  gastric   . CARDIAC CATHETERIZATION    . COLONOSCOPY    . COLONOSCOPY WITH PROPOFOL N/A 10/06/2017   Procedure: COLONOSCOPY WITH PROPOFOL;  Surgeon: Daneil Dolin, MD;  Location: [facility] ENDO SUITE;   Service: Endoscopy;  Laterality: N/A;  1:45pm - patient can't come earlier due to transportation  . ESOPHAGOGASTRODUODENOSCOPY (EGD) WITH PROPOFOL N/A 10/06/2017   Procedure: ESOPHAGOGASTRODUODENOSCOPY (EGD) WITH PROPOFOL;  Surgeon: Daneil Dolin, MD;  [facility];  Service: Endoscopy;  Laterality: N/A;  . EUS N/A 11/24/2017   Procedure: UPPER ENDOSCOPIC ULTRASOUND (EUS) RADIAL;  Surgeon: Milus Banister, MD;  Location: [facility] ENDOSCOPY;  Service: Endoscopy;  Laterality: N/A;  . FEMORAL ARTERY - POPLITEAL ARTERY BYPASS GRAFT    . FEMORAL-POPLITEAL BYPASS GRAFT  09/26/2012   Procedure: BYPASS GRAFT FEMORAL-POPLITEAL ARTERY;  Surgeon: Elam Dutch, MD;  Location: [facility] OR;  Service: Vascular;  Laterality: Left;  using 17mm Gore-Tex Propaten Ringed Graft, Vein patch on the proximal and distal anastomosis  . FEMORAL-POPLITEAL BYPASS GRAFT Right 08/12/2014   Procedure:  FEMORAL-POPLITEAL ARTERY BYPASS WITH SAPHENOUS VEIN GRAFT, INTRAOPERATIVE ARTERIOGRAM;  Surgeon: Elam Dutch, MD;  Location: [place];  Service: Vascular;  Laterality: Right;  . GASTRECTOMY N/A 05/04/2018   Procedure: Total Gastrectomy with Feeding Jejunostomy;  Surgeon: [name], MD;  Location: [place];  Service: General;  Laterality: N/A;  . INTRAOPERATIVE ARTERIOGRAM  08/12/2014   Procedure: INTRA OPERATIVE ARTERIOGRAM;  Surgeon: Elam Dutch, MD;  Location: [place];  Service: Vascular;;  . LAPAROSCOPY N/A 05/04/2018   Procedure: LAPAROSCOPY DIAGNOSTIC ERAS PATHWAY;  Surgeon: [name], MD;  Location: [place];  Service: General;  Laterality: N/A;  EPIDURAL  . POLYPECTOMY  10/06/2017   Procedure: POLYPECTOMY;  Surgeon: Daneil Dolin, MD;  [facility];  Service: Endoscopy;;  splenic flexure,  ascending  . PORTACATH PLACEMENT N/A 01/24/2018   Procedure: INSERTION PORT-A-CATH;  Surgeon: [name], MD;  Location: [place];  Service: General;  Laterality: N/A;  . PR VEIN BYPASS GRAFT,AORTO-FEM-POP    . stents  in bil legs          Home Medications    Prior to Admission medications   Medication Sig Start Date End Date Taking? Authorizing Provider  Amino Acids-Protein Hydrolys (FEEDING SUPPLEMENT, PRO-STAT SUGAR FREE 64,) LIQD Place 30 mLs into feeding tube 3 (three) times daily. 05/16/18   [name], MD  aspirin EC 81 MG tablet Take 81 mg by mouth daily.    [provider]  chlorproMAZINE (THORAZINE) 10 MG tablet Take 1 tablet (10 mg total) by mouth 4 (four) times daily as needed for hiccoughs. 05/16/18   [name], MD  feeding supplement, ENSURE ENLIVE, (ENSURE ENLIVE) LIQD Take 237 mLs by mouth 2 (two) times daily between meals. 05/16/18   [name], MD  methocarbamol (ROBAXIN) 500 MG tablet Take 1 tablet (500 mg total) by mouth every 6 (six) hours as needed for muscle spasms. 05/16/18   [name], MD  Nutritional Supplements (FEEDING SUPPLEMENT, OSMOLITE 1.5 CAL,) LIQD Place 1,000 mLs into feeding tube daily. 05/16/18   [name], MD  nystatin (MYCOSTATIN) 100000 UNIT/ML suspension TAKE 5 ML BY MOUTH THREE TIMES DAILY 06/02/18   [provider]  oxyCODONE (OXY IR/ROXICODONE) 5 MG immediate release tablet Take 1 tablet (5 mg total)  by mouth every 6 (six) hours as needed. 06/14/18   Higgs, Mathis Dad, MD  oxyCODONE (ROXICODONE) 5 MG/5ML solution Take 5-10 mLs (5-10 mg total) by mouth every 4 (four) hours as needed for moderate pain or severe pain. 05/16/18   [name], MD  pantoprazole (PROTONIX) 40 MG tablet Take 1 tablet (40 mg total) by mouth daily at 12 noon. 05/16/18   [name], MD  valACYclovir (VALTREX) 500 MG tablet Take 500 mg by mouth at bedtime.    [provider]    Family History Family History  Problem Relation Age of Onset  . Diabetes Mother   . Heart disease Mother        before age 35- Triple BPG  . Cancer Mother        Luekemia  . Deep vein thrombosis Mother   . Hyperlipidemia Mother   . Hypertension Mother   . Heart attack Mother    . Peripheral vascular disease Mother   . Stroke Mother        x 3  . Pancreatic cancer Mother   . Arthritis Father   . Hypertension Father   . Kidney disease Father   . Cancer Maternal Grandmother        NOS  . Diabetes Cousin   . Cancer Cousin        NOS, pat first cousin  . Cancer Cousin        NOS, pat first cousin  . Colon cancer Neg Hx   . Gastric cancer Neg Hx   . Esophageal cancer Neg Hx     Social History Social History   Tobacco Use  . Smoking status: Current Every Day Smoker    Packs/day: 1.00    Years: 25.00    Pack years: 25.00    Types: Cigars    Start date: 11/16/1974  . Smokeless tobacco: Never Used  . Tobacco comment: Black and Mild cigars  Substance Use Topics  . Alcohol use: No    Alcohol/week: 0.0 standard drinks  . Drug use: Yes    Types: Marijuana    Comment: last use 05/03/18     Allergies   Potassium-containing compounds   Review of Systems Review of Systems  Constitutional: Negative for chills and fever.  Respiratory: Negative for shortness of breath.   Cardiovascular: Negative for chest pain.  Gastrointestinal: Positive for nausea and vomiting. Negative for abdominal pain, blood in stool, constipation and diarrhea.       + for previously swallowed FB sensation   All other systems reviewed and are negative.    Physical Exam Updated Vital Signs BP 124/83 (BP Location: Right Arm)   Pulse 90   Temp 98.9 F (37.2 C) (Oral)   Resp 16   Ht 6\' 1"  (1.854 m)   Wt 78 kg   SpO2 100%   BMI 22.69 kg/m   Physical Exam  Constitutional: He appears well-developed and well-nourished.  Non-toxic appearance. No distress.  HENT:  Head: Normocephalic and atraumatic.  Mouth/Throat: Uvula is midline and oropharynx is clear and moist.  Patient is tolerating his own secretions without difficulty.  No drooling.  Eyes: Conjunctivae are normal. Right eye exhibits no discharge. Left eye exhibits no discharge.  Neck: Neck supple.  Cardiovascular:  Normal rate and regular rhythm.  Pulmonary/Chest: Effort normal and breath sounds normal. No stridor. No respiratory distress. He has no wheezes. He has no rhonchi. He has no rales.  Respiration even and unlabored  Abdominal: Soft. He exhibits no  distension. There is no tenderness.  Jejunostomy tube in place to L side of abdomen, no surrounding erythema/warmth/purulent drainage.   Neurological: He is alert.  Clear speech.   Skin: Skin is warm and dry. No rash noted.  Psychiatric: He has a normal mood and affect. His behavior is normal.  Nursing note and vitals reviewed.    ED Treatments / Results  Labs (all labs ordered are listed, but only abnormal results are displayed) Labs Reviewed - No data to display  EKG None  Radiology Dg Neck Soft Tissue  Result Date: 06/15/2018 CLINICAL DATA:  Possibly swallowed upper dentures. Feels like something stuck in throat. EXAM: NECK SOFT TISSUES - 1+ VIEW COMPARISON:  None. FINDINGS: There is no evidence of retropharyngeal soft tissue swelling or epiglottic enlargement. The cervical airway is unremarkable and no radio-opaque foreign body identified. Degenerative changes in the cervical spine. Scattered carotid artery calcifications. IMPRESSION: No radiopaque foreign body.  Airway patent. Electronically Signed   By: Rolm Baptise M.D.   On: 06/15/2018 09:42   Dg Abdomen Acute W/chest  Result Date: 06/15/2018 CLINICAL DATA:  Concern for swallowing top dentures EXAM: DG ABDOMEN ACUTE W/ 1V CHEST COMPARISON:  04/05/2018 FINDINGS: Left Port-A-Cath remains in place, unchanged. Heart and mediastinal contours are within normal limits. No focal opacities or effusions. No acute bony abnormality. No radiopaque foreign body. Nonobstructive bowel gas pattern. Drainage catheter in the left abdomen. No other radiopaque foreign body. No organomegaly. Vascular calcifications and calcified phleboliths in the pelvis. IMPRESSION: No suspicious radiopaque foreign body. Left  abdominal surgical drain in place. No acute cardiopulmonary disease. Electronically Signed   By: Rolm Baptise M.D.   On: 06/15/2018 09:43    Procedures Procedures (including critical care time)  Medications Ordered in ED Medications - No data to display   Initial Impression / Assessment and Plan / ED Course  I have reviewed the triage vital signs and the nursing notes.  Pertinent labs & imaging results that were available during my care of the patient were reviewed by me and considered in my medical decision making (see chart for details).   Patient presents to the emergency department with concern for possibly having swallowed his upper dentures last night while he was sleeping.  Currently with foreign body sensation to suprasternal notch, nausea, and one episode of emesis earlier today.  He has been able to keep some water down.  He does not feel short of breath.  He has a fairly benign physical exam.  He is tolerating his own secretions without difficulty, he is not drooling.  Further evaluate with x-rays.  X-rays of neck soft tissue as well as chest/abdomen do not reveal evidence of radiopaque foreign body, airway is patent.  Given patient's reported size of his dentures, I would suspect there would be changes to his x-rays consistent with this type of foreign body, however not definitvely. Patient tolerating PO fluids without difficulty and feels ready to go home. We will have him follow up closely with his gastroenterologist. I discussed results, treatment plan, need for follow-up, and return precautions with the patient. Provided opportunity for questions, patient confirmed understanding and is in agreement with plan.   Findings and plan of care discussed with supervising physician Dr. Rogene Houston who is in agreement with assessment and plan.    Final Clinical Impressions(s) / ED Diagnoses   Final diagnoses:  Globus sensation    ED Discharge Orders    None       Lino Wickliff,  Aldona Bar  R, PA-C 06/15/18 1037    Fredia Sorrow, MD 06/16/18 2007

## 2018-06-16 ENCOUNTER — Other Ambulatory Visit (HOSPITAL_COMMUNITY): Payer: Self-pay

## 2018-06-16 ENCOUNTER — Telehealth (HOSPITAL_COMMUNITY): Payer: Self-pay | Admitting: Internal Medicine

## 2018-06-16 ENCOUNTER — Other Ambulatory Visit (HOSPITAL_COMMUNITY): Payer: Self-pay | Admitting: Internal Medicine

## 2018-06-16 ENCOUNTER — Telehealth (HOSPITAL_COMMUNITY): Payer: Self-pay | Admitting: Pharmacist

## 2018-06-16 ENCOUNTER — Inpatient Hospital Stay (HOSPITAL_COMMUNITY): Payer: Medicare Other

## 2018-06-16 DIAGNOSIS — C169 Malignant neoplasm of stomach, unspecified: Secondary | ICD-10-CM

## 2018-06-16 MED ORDER — CAPECITABINE 500 MG PO TABS: 1000.0000 mg/m2 | ORAL_TABLET | Freq: Two times a day (BID) | ORAL | 0 refills | Status: DC

## 2018-06-16 NOTE — Telephone Encounter (Signed)
FAXED [facility]

## 2018-06-16 NOTE — Patient Instructions (Addendum)
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  You are going to be treated with chemotherapy and radiation.  We are going to first treat you with 2 cycles of Cisplatin (Platinol ) through your port a cath and Xeloda (capecitabine) a chemotherapy pill you take daily.  You will then start radiation along with your Xeloda and once radiation is complete you will come back for an additional 1-2 cycles of cisplatin and xeloda.  You will see the doctor regularly throughout treatment.  We monitor your lab work prior to every treatment. The doctor monitors your response to treatment by the way you are feeling, your blood work, and scans periodically.  There will be wait times while you are here for treatment.  It will take about 30 minutes to 1 hour for your lab work to result.  Then there will be wait times while pharmacy mixes your medications.   You will receive the following premedications prior to receiving chemotherapy:  Aloxi - high powered nausea/vomiting prevention medication used for chemotherapy patients.  Emend - high powered nausea/vomiting prevention medication used for chemotherapy patients.    Cisplatin (Generic Name) Other Names: Platinol, platinum  About This Drug Cisplatin is a drug used to treat cancer. This drug is given in the vein (IV).  This drug takes 1 hour to infuse.  Possible Side Effects (More Common) . This drug may affect how your kidneys work. Your kidney function will be checked as needed. . Electrolyte changes. Your blood will be checked for electrolyte changes as needed. . High-frequency hearing loss may occur. You will get IV fluids before and during the Cisplatin infusion to help prevent this. You may also get ringing in the ears. . Bone marrow depression. This is a decrease in the number of white blood cells, red blood cells, and platelets. This may raise your risk of infection, make you tired and weak  (fatigue), and raise your risk of bleeding. . Nausea and throwing up (vomiting). These symptoms may happen within a few hours after your treatment and may last for a few days to a week. Medicines are available to stop or lessen these side effects.  Possible Side Effects (Less Common) . Effects on the nerves are called peripheral neuropathy. You may feel numbness or pain in your hands and feet. It may be hard for you to button your clothes, open jars, or walk as usual. The effect on the nerves may get worse with more doses of the drug. These effects get better in some people after the drug is stopped, but it does not get better in all people. Marland Kitchen Blurred vision or other changes in eyesight. . Soreness of the mouth and throat. You may have red areas, white patches, or sores that hurt. . Hair loss. You may notice your hair getting thin. Some patients lose their hair. Your hair often grows back when treatment is done.    Allergic Reactions Allergic reactions to this drug are rare, but may happen in some patients. Signs of allergic reactions to this drug may be a rash, fever, chills, feeling dizzy, trouble breathing, and/or feeling that your heart is beating in a fast or not normal way.  Treating Side Effects . Drink 6-8 cups of fluids each day unless your doctor has told you to limit your fluid intake due to some other health problem. A cup is 8 ounces of fluid. If you throw up or have loose bowel movements  you should drink more fluids so that you do not become dehydrated (lack water in the body due to losing too much fluid). . If you have numbness and tingling in your hands and feet, be careful when cooking, walking, and handling sharp objects and hot liquids. . Mouth care is very important. Your mouth care should consist of routine, gentle cleaning of your teeth or dentures and rinsing your mouth with a mixture of 1/2 teaspoon of salt in 8 ounces of water or  teaspoon of baking soda in 8 ounces of  water. This should be done at least after each meal and at bedtime. . If you have mouth sores, avoid mouthwash that has alcohol. Also avoid alcohol and smoking because they can bother your mouth and throat. . Talk with your nurse about getting a wig before you lose your hair. Also, call the [place] at 800-ACS-2345 to find out information about the "Look Good, Feel Better" program close to where you live. It is a free program where women getting chemotherapy can learn about wigs, turbans and scarves as well as makeup techniques and skin and nail care.  Food and Drug Interactions There are no known interactions of Cisplatin with food. This drug may interact with other medicines. Tell your doctor and pharmacist about all the medicines and dietary supplements (vitamins, minerals, herbs and others) that you are taking at this time. The safety and use of dietary supplements and alternative diets are often not known. Using these might affect your cancer or interfere with your treatment. Until more is known, you should not use dietary supplements or alternative diets without your cancer doctor's help.  When to Call the Doctor Call your doctor or nurse right away if you have any of these symptoms: . Rash or itching . Feeling dizzy or lightheaded . Wheezing or trouble breathing . Swelling of the face . Fever of 100.5 F (38 C) or above . Chills . Easy bleeding or bruising . Decreased urine . Weight gain of 5 pounds in one week (fluid retention) . Nausea that stops you from eating or drinking . Throwing up more than 3 times a day Call your doctor or nurse as soon as possible if you have these symptoms: . Numbness, tingling, decreased feeling or weakness in fingers, toes, arms, or legs . Trouble walking or changes in the way you walk, feeling clumsy when buttoning clothes, opening jars, or other routine hand motions . Blurred vision or other changes in eyesight . Changes in hearing,  ringing in the ears . Pain in your mouth or throat that makes it hard to eat or drink . Fatigue that interferes with your daily activities  Sexual Problems and Reproductive Concerns . Infertility warning: Sexual problems and reproduction concerns may occur. In both men and women, this drug may affect your ability to have children. This cannot be determined before your treatment. Speak with your doctor or nurse if you plan to have children. Ask for information on sperm or egg banking. . In men, this drug may interfere with your ability to make sperm, but it should not change your ability to have sexual relations. . In women, menstrual bleeding may become irregular or stop while you are receiving this drug. Do not assume that you cannot become pregnant if you do not have a menstrual period. . Women may experience signs of menopause like vaginal dryness, itching, and pain during sexual relations . Genetic counseling is available for you to talk about the  effects of this drug therapy on future pregnancies. Also, a genetic counselor can look at the possible risk of problems in the unborn baby due to this medicine if an exposure happens during pregnancy. . Pregnancy warning: The drug may have harmful effects on the unborn child, so effective methods of birth control should be used during your cancer treatment. . Breast feeding warning: Women should not breast feed during treatment because this drug could enter the breast milk and badly harm a breast feeding baby.  Capecitabine (Xeloda)  About This Drug Capecitabine is used to treat cancer. It is given orally (by mouth).  Possible Side Effects . Tiredness and weakness . Loose bowel movements (diarrhea) . Nausea and throwing up (vomiting) . Pain in your abdomen . Hand-and-foot syndrome. The palms of your hands or soles of your feet may tingle, become numb, painful, swollen, or red. . Decrease in red blood cells. This may make you feel more  tired. . Increased total bilirubin in your blood. This may mean that you have changes in your liver function. Note: Each of the side effects above was reported in 30% or greater of patients treated with capecitabine. Not all possible side effects are included above.  Warnings and Precautions . Abnormal bleeding if you are taking blood thinners such as warfarin - symptoms may be coughing up blood, throwing up blood (may look like coffee grounds), red or black tarry bowel movements, abnormally heavy menstrual flow, nosebleeds or any other unusual bleeding. . Severe diarrhea . Changes in the tissue of the heart and/or heart attack. Some changes may happen that can cause your heart to have less ability to pump blood. . Increase risk of severe side effects if you have a known Dihydropyrimidine dehydrogenase deficiency. . Dehydration (lack of water in the body from losing too much fluid), which may affect how your kidneys work which can be life-threatening . Severe allergic skin reaction. You may develop blisters on your skin that are filled with fluid or a severe red rash all over your body that may be painful. . Decrease in the number of white blood cells, red blood cells, and platelets. This may raise your risk of infection, make you tired and weak (fatigue), and raise your risk of bleeding . Patients greater than 3 years of age are at increased risk of severe and life-threatening side effects . Changes in your liver function, which can cause liver failure Note: Some of the side effects above are very rare. If you have concerns and/or questions, please discuss them with your medical team.  How to Take Your Medication . Swallow the medicine whole with water within 30 minutes after a meal. Do not break or crush it. . Missed dose: If you vomit or miss a dose, take your next dose at the regular time, and contact your physician. Do not take 2 doses at the same time and do not double up on the  next dose. Marland Kitchen Handling: Wash your hands after handling your medicine, your caretakers should not handle your medicine with bare hands and should wear latex gloves. . This drug may be present in the saliva, tears, sweat, urine, stool, vomit, semen, and vaginal secretions. Talk to your doctor and/or your nurse about the necessary precautions to take during this time. . Storage: Store this medicine in the original container at room temperature. Discuss with your nurse or your doctor how to dispose of unused medicine.  Treating Side Effects . Drink plenty of fluids (a minimum of  eight glasses per day is recommended). . If you throw up or have loose bowel movements, you should drink more fluids so that you do not become dehydrated (lack water in the body from losing too much fluid). . If you get diarrhea, eat low-fiber foods that are high in protein and calories and avoid foods that can irritate your digestive tracts or lead to cramping. . Ask your nurse or doctor about medicine that can lessen or stop your diarrhea. . To help with nausea and vomiting, eat small, frequent meals instead of three large meals a day. Choose foods and drinks that are at room temperature. Ask your nurse or doctor about other helpful tips and medicine that is available to help or stop lessen these symptoms. . Manage tiredness by pacing your activities for the day. . Be sure to include periods of rest between energy-draining activities. . To decrease infection, wash your hands regularly. . Avoid close contact with people who have a cold, the flu, or other infections. . Take your temperature as your doctor or nurse tells you, and whenever you feel like you may have a fever. . To help decrease bleeding, use a soft toothbrush. Check with your nurse before using dental floss. . Be very careful when using knives or tools. . Use an electric shaver instead of a razor. Marland Kitchen Keeping your pain under control is important to your  well-being. Please tell your doctor or nurse if you are experiencing pain. . Avoid sun exposure and apply sunscreen routinely when outdoors. . If you get a rash do not put anything on it unless your doctor or nurse says you may. Keep the area around the rash clean and dry. Ask your doctor for medicine if your rash bothers you.  Food and Drug Interactions . There are no known interactions of capecitabine with food, however this medication should be taken within 30 minutes after a meal. . Check with your doctor or pharmacist about all other prescription medicines and dietary supplements you are taking before starting this medicine as there are known drug interactions with capecitabine. Also, check with your doctor or pharmacist before starting any new prescription or over-the-counter medicines, or dietary supplement to make sure that there are no interactions. . There are known interactions of capecitabine with blood thinning medicine such as warfarin. Ask your doctor what precautions you should take.  When to Call the Doctor Call your doctor or nurse if you have any of these symptoms and/or any new or unusual symptoms: . Fever of 100.5 F (38 C) or higher . Chills . Trouble breathing . Feeling that your heart is beating in a fast or not normal way (palpitations) . Pain in your chest . Chest pain or symptoms of a heart attack. Most heart attacks involve pain in the center of the chest that lasts more than a few minutes. The pain may go away and come back or it can be constant. It can feel like pressure, squeezing, fullness, or pain. Sometimes pain is felt in one or both arms, the back, neck, jaw, or stomach. If any of these symptoms last 2 minutes, call 911. . Fatigue that interferes with your daily activities . Feeling dizzy or lightheaded . Easy bleeding or bruising . Blood in your urine, vomit (bright red or coffee-ground) and/or stools ( bright red, or black/tarry) . Coughing up  blood . Decreased urine . Loose bowel movements (diarrhea) 4 times a day or loose bowel movements with lack of strength or a  feeling of being dizzy . Nausea that stops you from eating or drinking and/or is not relieved by prescribed medicines . Throwing up more than 3 times a day . Lasting loss of appetite or rapid weight loss of five pounds in a week . Pain that does not go away or is not relieved by prescribed medicine . Signs of possible liver problems: dark urine, pale bowel movements, bad stomach pain, feeling very tired and weak, unusual itching, or yellowing of the eyes or skin . Painful, red, or swollen areas on your hands or feet. . Numbness and/or tingling of your hands and/or feet . A new rash or a rash that is not relieved by prescribed medicines . Flu-like symptoms: fever, headache, muscle and joint aches, and fatigue (low energy, feeling weak) . If you think you may be pregnant or may have impregnated your partner  Reproduction Warnings . Pregnancy warning: This drug can have harmful effects on the unborn baby. Women of child bearing potential should use effective methods of birth control during your cancer treatment and for at least 6 months after treatment. Men with male partners of child bearing potential should use effective methods of birth control during your cancer treatment and for at least 3 months after your cancer treatment. Let your doctor know right away if you think you may be pregnant or may have impregnated your partner. . Breastfeeding warning: Women should not breast feed during treatment and for 2 weeks month after treatment because this drug could enter the breast milk and cause harm to a breast feeding baby. . Fertility warning: In men and women both, this drug may affect your ability to have children in the future. Talk with your doctor or nurse if you plan to have children. Ask for information on sperm or egg banking.  SELF CARE ACTIVITIES WHILE ON  CHEMOTHERAPY:  Hydration Increase your fluid intake 48 hours prior to treatment and drink at least 8 to 12 cups (64 ounces) of water/decaffeinated beverages per day after treatment. You can still have your cup of coffee or soda but these beverages do not count as part of your 8 to 12 cups that you need to drink daily. No alcohol intake.  Medications Continue taking your normal prescription medication as prescribed.  If you start any new herbal or new supplements please let us know first to make sure it is safe.  Mouth Care Have teeth cleaned professionally before starting treatment. Keep dentures and partial plates clean. Use soft toothbrush and do not use mouthwashes that contain alcohol. Biotene is a good mouthwash that is available at most pharmacies or may be ordered by calling [phone]. Use warm salt water gargles (1 teaspoon salt per 1 quart warm water) before and after meals and at bedtime. Or you may rinse with 2 tablespoons of three-percent hydrogen peroxide mixed in eight ounces of water. If you are still having problems with your mouth or sores in your mouth please call the clinic. If you need dental work, please let the doctor know before you go for your appointment so that we can coordinate the best possible time for you in regards to your chemo regimen. You need to also let your dentist know that you are actively taking chemo. We may need to do labs prior to your dental appointment.  Skin Care Always use sunscreen that has not expired and with SPF (Sun Protection Factor) of 50 or higher. Wear hats to protect your head from the sun. Remember to  use sunscreen on your hands, ears, face, & feet.  Use good moisturizing lotions such as udder cream, eucerin, or even Vaseline. Some chemotherapies can cause dry skin, color changes in your skin and nails.    . Avoid long, hot showers or baths. . Use gentle, fragrance-free soaps and laundry detergent. . Use moisturizers, preferably creams  or ointments rather than lotions because the thicker consistency is better at preventing skin dehydration. Apply the cream or ointment within 15 minutes of showering. Reapply moisturizer at night, and moisturize your hands every time after you wash them.  Hair Loss (if your doctor says your hair will fall out)  . If your doctor says that your hair is likely to fall out, decide before you begin chemo whether you want to wear a wig. You may want to shop before treatment to match your hair color. . Hats, turbans, and scarves can also camouflage hair loss, although some people prefer to leave their heads uncovered. If you go bare-headed outdoors, be sure to use sunscreen on your scalp. . Cut your hair short. It eases the inconvenience of shedding lots of hair, but it also can reduce the emotional impact of watching your hair fall out. . Don't perm or color your hair during chemotherapy. Those chemical treatments are already damaging to hair and can enhance hair loss. Once your chemo treatments are done and your hair has grown back, it's OK to resume dyeing or perming hair. With chemotherapy, hair loss is almost always temporary. But when it grows back, it may be a different color or texture. In older adults who still had hair color before chemotherapy, the new growth may be completely gray.  Often, new hair is very fine and soft.  Infection Prevention Please wash your hands for at least 30 seconds using warm soapy water. Handwashing is the #1 way to prevent the spread of germs. Stay away from sick people or people who are getting over a cold. If you develop respiratory systems such as green/yellow mucus production or productive cough or persistent cough let us know and we will see if you need an antibiotic. It is a good idea to keep a pair of gloves on when going into grocery stores/[employer] to decrease your risk of coming into contact with germs on the carts, etc. Carry alcohol hand gel with you at all times  and use it frequently if out in public. If your temperature reaches 100.5 or higher please call the clinic and let us know.  If it is after hours or on the weekend please go to the ER if your temperature is over 100.5.  Please have your own personal thermometer at home to use.    Sex and bodily fluids If you are going to have sex, a condom must be used to protect the person that isn't taking chemotherapy. Chemo can decrease your libido (sex drive). For a few days after chemotherapy, chemotherapy can be excreted through your bodily fluids.  When using the toilet please close the lid and flush the toilet twice.  Do this for a few day after you have had chemotherapy.   Effects of chemotherapy on your sex life Some changes are simple and won't last long. They won't affect your sex life permanently. Sometimes you may feel: . too tired . not strong enough to be very active . sick or sore  . not in the mood . anxious or low Your anxiety might not seem related to sex. For example, you may  be worried about the cancer and how your treatment is going. Or you may be worried about money, or about how you family are coping with your illness. These things can cause stress, which can affect your interest in sex. It's important to talk to your partner about how you feel. Remember - the changes to your sex life don't usually last long. There's usually no medical reason to stop having sex during chemo. The drugs won't have any long term physical effects on your performance or enjoyment of sex. Cancer can't be passed on to your partner during sex  Contraception It's important to use reliable contraception during treatment. Avoid getting pregnant while you or your partner are having chemotherapy. This is because the drugs may harm the baby. Sometimes chemotherapy drugs can leave a man or woman infertile.  This means you would not be able to have children in the future. You might want to talk to someone about permanent  infertility. It can be very difficult to learn that you may no longer be able to have children. Some people find counselling helpful. There might be ways to preserve your fertility, although this is easier for men than for women. You may want to speak to a fertility expert. You can talk about sperm banking or harvesting your eggs. You can also ask about other fertility options, such as donor eggs. If you have or have had breast cancer, your doctor might advise you not to take the contraceptive pill. This is because the hormones in it might affect the cancer.  It is not known for sure whether or not chemotherapy drugs can be passed on through semen or secretions from the vagina. Because of this some doctors advise people to use a barrier method if you have sex during treatment. This applies to vaginal, anal or oral sex. Generally, doctors advise a barrier method only for the time you are actually having the treatment and for about a week after your treatment. Advice like this can be worrying, but this does not mean that you have to avoid being intimate with your partner. You can still have close contact with your partner and continue to enjoy sex.  Animals If you have cats or birds we just ask that you not change the litter or change the cage.  Please have someone else do this for you while you are on chemotherapy.   Food Safety During and After Cancer Treatment Food safety is important for people both during and after cancer treatment. Cancer and cancer treatments, such as chemotherapy, radiation therapy, and stem cell/bone marrow transplantation, often weaken the immune system. This makes it harder for your body to protect itself from foodborne illness, also called food poisoning. Foodborne illness is caused by eating food that contains harmful bacteria, parasites, or viruses.  Foods to avoid Some foods have a higher risk of becoming tainted with bacteria. These include: Marland Kitchen Unwashed fresh fruit and  vegetables, especially leafy vegetables that can hide dirt and other contaminants . Raw sprouts, such as alfalfa sprouts . Raw or undercooked beef, especially ground beef, or other raw or undercooked meat and poultry . Fatty, fried, or spicy foods immediately before or after treatment.  These can sit heavy on your stomach and make you feel nauseous. . Raw or undercooked shellfish, such as oysters. . Sushi and sashimi, which often contain raw fish.  . Unpasteurized beverages, such as unpasteurized fruit juices, raw milk, raw yogurt, or cider . Undercooked eggs, such as soft boiled, over  easy, and poached; raw, unpasteurized eggs; or foods made with raw egg, such as homemade raw cookie dough and homemade mayonnaise Simple steps for food safety Shop smart. . Do not buy food stored or displayed in an unclean area. . Do not buy bruised or damaged fruits or vegetables. . Do not buy cans that have cracks, dents, or bulges. . Pick up foods that can spoil at the end of your shopping trip and store them in a cooler on the way home. Prepare and clean up foods carefully. . Rinse all fresh fruits and vegetables under running water, and dry them with a clean towel or paper towel. . Clean the top of cans before opening them. . After preparing food, wash your hands for 20 seconds with hot water and soap. Pay special attention to areas between fingers and under nails. . Clean your utensils and dishes with hot water and soap. [place] Kitchen Disinfect your kitchen and cutting boards using 1 teaspoon of liquid, unscented bleach mixed into 1 quart of water.   Dispose of old food. . Eat canned and packaged food before its expiration date (the "use by" or "best before" date). . Consume refrigerated leftovers within 3 to 4 days. After that time, throw out the food. Even if the food does not smell or look spoiled, it still may be unsafe. Some bacteria, such as Listeria, can grow even on foods stored in the refrigerator if they are  kept for too long. Take precautions when eating out. . At restaurants, avoid buffets and salad bars where food sits out for a long time and comes in contact with many people. Food can become contaminated when someone with a virus, often a norovirus, or another "bug" handles it. . Put any leftover food in a "to-go" container yourself, rather than having the server do it. And, refrigerate leftovers as soon as you get home. . Choose restaurants that are clean and that are willing to prepare your food as you order it cooked.   MEDICATIONS:                                                                                                                                                                Compazine/Prochlorperazine 10mg  tablet. Take 1 tablet every 6 hours as needed for nausea/vomiting. (This can make you sleepy) Zofran / Ondansetron 8 mg take 1 tablet twice daily as needed for nausea. ( This can cause constipation)  EMLA cream. Apply a quarter size amount to port site 1 hour prior to chemo. Do not rub in. Cover with plastic wrap.   Over-the-Counter Meds:  Colace - 100 mg capsules - take 2 capsules daily.  If this doesn't help then you can increase to 2 capsules twice daily.  Call us if this does  not help your bowels move.   Imodium 2mg  capsule. Take 2 capsules after the 1st loose stool and then 1 capsule every 2 hours until you go a total of 12 hours without having a loose stool. Call the [facility] if loose stools continue. If diarrhea occurs at bedtime, take 2 capsules at bedtime. Then take 2 capsules every 4 hours until morning. Call [facility].    Diarrhea Sheet   If you are having loose stools/diarrhea, please purchase Imodium and begin taking as outlined:  At the first sign of poorly formed or loose stools you should begin taking Imodium (loperamide) 2 mg capsules.  Take two caplets (4mg ) followed by one caplet (2mg ) every 2 hours until you have had no diarrhea for 12  hours.  During the night take two caplets (4mg ) at bedtime and continue every 4 hours during the night until the morning.  Stop taking Imodium only after there is no sign of diarrhea for 12 hours.    Always call the [facility] if you are having loose stools/diarrhea that you can't get under control.  Loose stools/diarrhea leads to dehydration (loss of water) in your body.  We have other options of trying to get the loose stools/diarrhea to stop but you must let us know!   Constipation Sheet  Colace - 100 mg capsules - take 2 capsules daily.  If this doesn't help then you can increase to 2 capsules twice daily.  Please call if the above does not work for you.   Do not go more than 2 days without a bowel movement.  It is very important that you do not become constipated.  It will make you feel sick to your stomach (nausea) and can cause abdominal pain and vomiting.   Nausea Sheet   Compazine/Prochlorperazine 10mg  tablet. Take 1 tablet every 6 hours as needed for nausea/vomiting. (This can make you sleepy)  If you are having persistent nausea (nausea that does not stop) please call the [facility] and let us know the amount of nausea that you are experiencing.  If you begin to vomit, you need to call the [facility] and if it is the weekend and you have vomited more than one time and can't get it to stop-go to the Emergency Room.  Persistent nausea/vomiting can lead to dehydration (loss of fluid in your body) and will make you feel terrible.   Ice chips, sips of clear liquids, foods that are @ room temperature, crackers, and toast tend to be better tolerated.   SYMPTOMS TO REPORT AS SOON AS POSSIBLE AFTER TREATMENT:   FEVER GREATER THAN 100.5 F  CHILLS WITH OR WITHOUT FEVER  NAUSEA AND VOMITING THAT IS NOT CONTROLLED WITH YOUR NAUSEA MEDICATION  UNUSUAL SHORTNESS OF BREATH  UNUSUAL BRUISING OR BLEEDING  TENDERNESS IN MOUTH AND THROAT WITH OR WITHOUT PRESENCE OF  ULCERS  URINARY PROBLEMS  BOWEL PROBLEMS  UNUSUAL RASH      Wear comfortable clothing and clothing appropriate for easy access to any Portacath or PICC line. Let us know if there is anything that we can do to make your therapy better!    What to do if you need assistance after hours or on the weekends: CALL [phone].  HOLD on the line, do not hang up.  You will hear multiple messages but at the end you will be connected with a nurse triage line.  They will contact the doctor if necessary.  Most of the time they will be able  to assist you.  Do not call the hospital operator.      I have been informed and understand all of the instructions given to me and have received a copy. I have been instructed to call the clinic [phone] or my family physician as soon as possible for continued medical care, if indicated. I do not have any more questions at this time but understand that I may call the [facility] or the Patient Navigator at [phone] during office hours should I have questions or need assistance in obtaining follow-up care.

## 2018-06-16 NOTE — Telephone Encounter (Signed)
Oral Oncology Pharmacist Encounter  Received new prescription for Xeloda (capecitabine) for the treatment of diffuse gastric adenocarcinoma in conjunction with cicplatin, planned duration until disease progression or unacceptable drug toxicity.  CMP from 06/14/18 assessed, no relevant lab abnormalities. Prescription dose and frequency assessed.   Current medication list in Epic reviewed, a few DDIs with capecitabine identified: - Pantoprazole: Proton Pump Inhibitors may diminish the therapeutic effect of Capecitabine. Recommend evaluating the need for a proton pump inhibitor (PPI). If acid suppression is needed, recommend switching to a H2 antagonist (eg, ranitidine) to avoid this DDI.  - Chlorpromazine: Both capecitabine and chlorpromazine have a risk of QTc prolongation. His last ECG on 04/05/18 showed a QTc of 433. Todd [name] is prescribed chlorpromazine as needed for hiccups. If he is needing to take his chlorpromazine frequently I recommend rechecking his QTc prolongation while on this combination.    Prescription has been e-scribed to the [facility] for benefits analysis and approval.  Oral Oncology Clinic will continue to follow for insurance authorization, copayment issues, initial counseling and start date.  Darl Pikes, PharmD, BCPS, [facility] Hematology/Oncology Clinical Pharmacist [facility] [phone]  06/16/2018 4:01 PM

## 2018-06-16 NOTE — Progress Notes (Addendum)
Nutrition Follow-up:  Patient with gastric adenocarcinoma.  Patient s/p total gastrectomy with J-tube placement for enteral nutrition on 05/04/18.  Noted plan for adjuvant chemotherapy plans to start on 9/24.    Met with patient today and male friend.  Reports that he is tolerating osmolite 1.5 via J-tube.  Reports that the pump is set on 33m/hr and he runs pump from 8am-8pm (4.25 cartons). Reports that he is taking 365mof promod before starting pump and then flushing with 6094mf water after promod and before starting pump.  Reports that he is flushing with 38m26m water after unhooking pump as well.  Reports no problems tolerating tube feeding.    Reports bowels are moving about 3 times per day which is normal for him.  Reports bulky to loose stools but not watery.  Denies nausea, abdominal pain.  Reports that [facility] and teach him how to take care of tube.  He has plenty of supplies and tube feeding at this time.  Reports that he is eating foods like pudding, chicken, fish sticks, few french fries, applesauce, mashed potatoes, soups orally without difficulty.  Reports that he is drinking 5 ensure/boost per day (not sure variety). Tried to clarify this several different ways and patient assures RD that he is drinking 5 per day.  Reports that he is drinking water (about 2-3,500ml54mtle and sweet tea.     Medications: reviewed, taking medications orally  Labs: reviewed  Anthropometrics:   Weight 174 lb 4 oz today measured in clinic  Noted weight on last cancer center RD visit in June 2019 183 lb 6.4 oz  5% weight loss in 3 months Noted hospital weight 172 lb.    Estimated Energy Needs  Kcals: [id number]/d Protein: 118-125 g/d Fluid: 2.6 L/d  NUTRITION DIAGNOSIS: Inadequate oral intake improving following surgery   INTERVENTION:  Recommend continue current tube feeding regimen of 85ml/66mor 12 hours with 30ml o51momod daily.  Flush with 38ml of4mter, then mix 30ml pro55mwith 30ml of w25m then place via tube, then flush with 38ml of wa44m then hook up pump.  Once pump stopped flush with 38ml of wat72m Instructions written out for patient and given to him.  Will need reinforcement.   Tube feeding regimen providing 1630 kcals, 74 g of protein and 775ml free wa35m   Patient receiving additional calories and protein from oral nutrition supplements ([phone] calories and ~45-100 g of protein) pending variety. Question accuracy of quantity of oral nutrition supplements as patient reports trouble not being able to afford supplements.  Will monitor weight and labs, oral intake after starting chemotherapy before making adjustments to tube feeding regimen.  Patient given additional case of ensure today (4th) and coupons. Encouraged small frequent meals of soft foods Contact information provided    MONITORING, EVALUATION, GOAL: weight trends, intake, TF tolerance   NEXT VISIT: September 24 during infusion  October 15 during infusion with Nathan  Sanyah Molnar Jim Like, LDZenia ResidesisIndianareWare Shoalsietitian [phone]

## 2018-06-17 DIAGNOSIS — C160 Malignant neoplasm of cardia: Secondary | ICD-10-CM | POA: Diagnosis not present

## 2018-06-19 ENCOUNTER — Telehealth: Payer: Self-pay | Admitting: Pharmacy Technician

## 2018-06-19 ENCOUNTER — Telehealth (HOSPITAL_COMMUNITY): Payer: Self-pay | Admitting: Pharmacy Technician

## 2018-06-19 MED ORDER — PROCHLORPERAZINE MALEATE 10 MG PO TABS: 10.0000 mg | ORAL_TABLET | Freq: Four times a day (QID) | ORAL | 1 refills | Status: DC | PRN

## 2018-06-19 MED ORDER — LIDOCAINE-PRILOCAINE 2.5-2.5 % EX CREA: TOPICAL_CREAM | CUTANEOUS | 3 refills | Status: AC

## 2018-06-19 MED ORDER — CAPECITABINE 500 MG PO TABS: 1000.0000 mg/m2 | ORAL_TABLET | Freq: Two times a day (BID) | ORAL | 0 refills | Status: DC

## 2018-06-19 MED ORDER — ONDANSETRON HCL 8 MG PO TABS: 8.0000 mg | ORAL_TABLET | Freq: Two times a day (BID) | ORAL | 1 refills | Status: AC | PRN

## 2018-06-19 MED FILL — CAPECITABINE 500 MG TABS: 500 | 21 days supply | Qty: 112 | Fill #0

## 2018-06-19 NOTE — Telephone Encounter (Signed)
Oral Oncology Patient Advocate Encounter  Xeloda (capecitabine) is being filled at [facility].  Medication will be shipped out this afternoon (06/19/18) for delivery tomorrow 06/20/18.  Address has been verified.    [place] Patient [place] Phone [phone] Fax [phone] 06/19/2018 3:06 PM

## 2018-06-19 NOTE — Telephone Encounter (Signed)
Oral Chemotherapy Pharmacist Encounter  Patient Education I spoke with patient for overview of new oral chemotherapy medication: Xeloda (capecitabine) for the treatment of diffuse gastric adenocarcinoma in conjunction with cicplatin, planned duration until disease progression or unacceptable drug toxicity.  Counseled patient on administration, dosing, side effects, monitoring, drug-food interactions, safe handling, storage, and disposal. Patient will take 4 tablets (2,000 mg total) by mouth 2 (two) times daily after a meal. Take for 14 days on, then 7 days off.  Side effects include but not limited to: diarrhea, decreased wbc/hgb/plt, hand-foot syndrome, N/V, mouth sores.    Reviewed with patient importance of keeping a medication schedule and plan for any missed doses.  [name] voiced understanding and appreciation. All questions answered. Medication handout placed in the mail.  Provided patient with Oral [facility] phone number. Patient knows to call the office with questions or concerns. Oral Chemotherapy Navigation Clinic will continue to follow.  Darl Pikes, PharmD, BCPS, [facility] Hematology/Oncology Clinical Pharmacist [facility] [place] [facility] [phone]  06/19/2018 11:59 AM

## 2018-06-19 NOTE — Telephone Encounter (Signed)
Oral Oncology Patient Advocate Encounter  Completed benefits investigation for Xeloda.  No prior authorization is required at this time.    Copay is $63.66.    Patient does have Medicaid but it does not apply to pharmacy copays.  [employer] has a Medicaid code [facility]) that covers Medicare medical copays.  No grant funding available at this time.  [place] Patient [place] Phone [phone] Fax [phone] 06/19/2018 10:53 AM

## 2018-06-20 ENCOUNTER — Encounter (HOSPITAL_COMMUNITY): Payer: Self-pay | Admitting: Dietician

## 2018-06-20 ENCOUNTER — Inpatient Hospital Stay (HOSPITAL_COMMUNITY): Payer: Medicare Other

## 2018-06-20 ENCOUNTER — Other Ambulatory Visit (HOSPITAL_COMMUNITY): Payer: Self-pay | Admitting: *Deleted

## 2018-06-20 ENCOUNTER — Encounter (HOSPITAL_COMMUNITY): Payer: Self-pay

## 2018-06-20 VITALS — BP 139/80 | HR 87 | Temp 98.8°F | Resp 18 | Wt 177.6 lb

## 2018-06-20 DIAGNOSIS — I10 Essential (primary) hypertension: Secondary | ICD-10-CM | POA: Diagnosis not present

## 2018-06-20 DIAGNOSIS — Z8673 Personal history of transient ischemic attack (TIA), and cerebral infarction without residual deficits: Secondary | ICD-10-CM | POA: Diagnosis not present

## 2018-06-20 DIAGNOSIS — Z9221 Personal history of antineoplastic chemotherapy: Secondary | ICD-10-CM | POA: Diagnosis not present

## 2018-06-20 DIAGNOSIS — Z800 Family history of malignant neoplasm of digestive organs: Secondary | ICD-10-CM | POA: Diagnosis not present

## 2018-06-20 DIAGNOSIS — Z7982 Long term (current) use of aspirin: Secondary | ICD-10-CM | POA: Diagnosis not present

## 2018-06-20 DIAGNOSIS — I714 Abdominal aortic aneurysm, without rupture: Secondary | ICD-10-CM | POA: Diagnosis not present

## 2018-06-20 DIAGNOSIS — C169 Malignant neoplasm of stomach, unspecified: Secondary | ICD-10-CM | POA: Diagnosis not present

## 2018-06-20 DIAGNOSIS — R109 Unspecified abdominal pain: Secondary | ICD-10-CM | POA: Diagnosis not present

## 2018-06-20 DIAGNOSIS — Z5111 Encounter for antineoplastic chemotherapy: Secondary | ICD-10-CM | POA: Diagnosis not present

## 2018-06-20 DIAGNOSIS — Z79899 Other long term (current) drug therapy: Secondary | ICD-10-CM | POA: Diagnosis not present

## 2018-06-20 MED ORDER — SODIUM CHLORIDE 0.9% FLUSH
10.0000 mL | INTRAVENOUS | Status: DC | PRN
  Administered 2018-06-20: 10 mL
  Filled 2018-06-20: qty 10

## 2018-06-20 MED ORDER — PALONOSETRON HCL INJECTION 0.25 MG/5ML
INTRAVENOUS | Status: AC
  Filled 2018-06-20: qty 5

## 2018-06-20 MED ORDER — FULVESTRANT 250 MG/5ML IM SOLN
INTRAMUSCULAR | Status: AC
  Filled 2018-06-20: qty 5

## 2018-06-20 MED ORDER — SODIUM CHLORIDE 0.9 % IV SOLN
Freq: Once | INTRAVENOUS | Status: AC
  Administered 2018-06-20: 14:00:00 via INTRAVENOUS

## 2018-06-20 MED ORDER — SODIUM CHLORIDE 0.9 % IV SOLN
75.0000 mg/m2 | Freq: Once | INTRAVENOUS | Status: AC
  Administered 2018-06-20: 150 mg via INTRAVENOUS
  Filled 2018-06-20: qty 150

## 2018-06-20 MED ORDER — PALONOSETRON HCL INJECTION 0.25 MG/5ML
0.2500 mg | Freq: Once | INTRAVENOUS | Status: AC
  Administered 2018-06-20: 0.25 mg via INTRAVENOUS

## 2018-06-20 MED ORDER — POTASSIUM CHLORIDE 2 MEQ/ML IV SOLN: Freq: Once | INTRAVENOUS | Status: DC

## 2018-06-20 MED ORDER — SODIUM CHLORIDE 0.9 % IV SOLN
Freq: Once | INTRAVENOUS | Status: AC
  Administered 2018-06-20: 11:00:00 via INTRAVENOUS
  Filled 2018-06-20: qty 5

## 2018-06-20 MED ORDER — SODIUM CHLORIDE 0.9 % IV SOLN
INTRAVENOUS | Status: AC
  Administered 2018-06-20: 09:00:00 via INTRAVENOUS

## 2018-06-20 MED ORDER — SODIUM CHLORIDE 0.9 % IV SOLN
INTRAVENOUS | Status: DC
  Administered 2018-06-20: 11:00:00 via INTRAVENOUS

## 2018-06-20 MED ORDER — HEPARIN SOD (PORK) LOCK FLUSH 100 UNIT/ML IV SOLN
500.0000 [IU] | Freq: Once | INTRAVENOUS | Status: AC | PRN
  Administered 2018-06-20: 500 [IU]

## 2018-06-20 NOTE — Progress Notes (Signed)
0905 Reviewed lab results from 06/14/18 with Dr. Walden Field and order obtained to use those labs for chemo tx today and change IV hydration to plain NS 1 liter over 2 hours pre-hydration instead of NS with Potassium and Magnesium since pt reports allergy to Potassium that consisted of lip and tongue swelling.                                                 [place] tolerated Cisplatin infusion with hydration well without complaints or incident.Chemotherapy education packet with drug-specific information given to patient and reviewed in detail. All questions were answered and permit obtained. Pt to start taking his Xeloda this afternoon as prescribed for 14 days then off for 7 days.Pt to pick up his Zofran and Compazine from pharmacy on his way home today per pt. Also reviewed with pt who verbalized understanding. VSS upon discharge.Pt discharged self ambulatory in satisfactory condition accompanied by family member

## 2018-06-20 NOTE — Patient Instructions (Signed)
[facility] Discharge Instructions for Patients Receiving Chemotherapy   Beginning January 23rd 2017 lab work for the Cancer Center will be done in the  Main lab at [place] on 1st floor. If you have a lab appointment with the Cancer Center please come in thru the  Main Entrance and check in at the main information desk   Today you received the following chemotherapy agents Cisplatin.Follow-up as scheduled. Call clinic for any questions or concerns  To help prevent nausea and vomiting after your treatment, we encourage you to take your nausea medication   If you develop nausea and vomiting, or diarrhea that is not controlled by your medication, call the clinic.  The clinic phone number is [phone]. Office hours are Monday-Friday 8:30am-5:00pm.  BELOW ARE SYMPTOMS THAT SHOULD BE REPORTED IMMEDIATELY:  *FEVER GREATER THAN 101.0 F  *CHILLS WITH OR WITHOUT FEVER  NAUSEA AND VOMITING THAT IS NOT CONTROLLED WITH YOUR NAUSEA MEDICATION  *UNUSUAL SHORTNESS OF BREATH  *UNUSUAL BRUISING OR BLEEDING  TENDERNESS IN MOUTH AND THROAT WITH OR WITHOUT PRESENCE OF ULCERS  *URINARY PROBLEMS  *BOWEL PROBLEMS  UNUSUAL RASH Items with * indicate a potential emergency and should be followed up as soon as possible. If you have an emergency after office hours please contact your primary care physician or go to the nearest emergency department.  Please call the clinic during office hours if you have any questions or concerns.   You may also contact the Patient Navigator at [phone] should you have any questions or need assistance in obtaining follow up care.      Resources For Cancer Patients and their Caregivers ? American Cancer Society: Can assist with transportation, wigs, general needs, runs Look Good Feel Better.        1-888-227-6333 ? Cancer Care: Provides financial assistance, online support groups, medication/co-pay assistance.  1-800-813-HOPE  (4673) ? [facility] Assists [place] cancer patients and their families through emotional , educational and financial support.  336-427-4357 ? [place] DSS Where to apply for food stamps, Medicaid and utility assistance. 336-342-1394 ? [facility]: Transportation to medical appointments. 336-347-2287 ? Social Security Administration: May apply for disability if have a Stage IV cancer. 336-342-7796 1-800-772-1213 ? [place] Aging, Disability and Transit Services: Assists with nutrition, care and transit needs. 336-349-2343         

## 2018-06-20 NOTE — Progress Notes (Signed)
Nutrition Follow-up  57 y/o male PMHx CAD, CKD2, PVD, CVA, Tobacco abuse, HTN/HLD. Developed acute epigastric pain w/ n/v and globus sensation Dec of 2018. EGD would later show GEJ ulcers, s/p biopsy + for gastric carcinoma. Underwent 4 cycles of FLOT chemo regimen..Pt initially decided to forego recommended surgical intervention. 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 Began adjuvant chemotherapy on 9/24, adjuvant radiation to start 9/25.   Patient seen today at first adjuvant chemotherapy infusion. He was seen by RD 4 days ago. His last measured weight appears to have been on 9/4, which was 172.3 lbs. Today, he shows a >5 lb wt gain x 3 weeks.   He reports he has been mostly compliant with following tube feeding regimen: Osmolite 1.5 @ 85 cc/hr x 12 hrs-8 pm-8 am w/ 56ml Promod q 24 hrs (mix w/ 30 ml fluid) w/ 60 ml flush before and after tube feeding infusion. This regimen provides: 1630 kcals, 74g Pro, 927 mls (influding flushes). He has been unable to infuse the promod because it "wouldn't go through the tube". It sounds it was too viscous.  He denies having n/v/c/d related to tube feeding. He does say he experience a "tightness" in his abdomen when he wakes up, but this is tolerable. He says he HAD been only infusing at 65 cc/hr at one point due to a misunderstanding. This tightness was not as bad at that rate.   In addition to his tube feeding, he reports he is drinking 5 ensure enlive supplements per day? If accurate, this would provide 1750 kcals, 100g Protein alone.   He also reports eating "table food". Fish, shrimp Doritos, nachos, fish sticks and other random items. Surprisingly, he reports tolerating these items extremely well. He denies any n/v/c/d with ORAL intake. He does note early satiety. He also says he still has odynophagia, though it is tolerable.   He reports readily drinking fluids throughout the day. Also  eats a lot of ice cream, jello, pudding etc.   He has some intermittent hiccups, which are painful.   Other than these few issues, he says he feels good.   Wt Readings from Last 10 Encounters:  06/20/18 177 lb 9.6 oz (80.6 kg)  06/15/18 172 lb (78 kg)  06/14/18 172 lb (78 kg)  05/31/18 172 lb 6.4 oz (78.2 kg)  05/15/18 172 lb 9.9 oz (78.3 kg)  04/20/18 182 lb 1.6 oz (82.6 kg)  04/05/18 180 lb (81.6 kg)  03/18/18 180 lb (81.6 kg)  03/14/18 183 lb 6.4 oz (83.2 kg)  03/01/18 187 lb 12.8 oz (85.2 kg)   MEDICATIONS:  Chemotherapy: Xeloda   Other: Thorazine, Ensure Enlive, Nystatin, Zofran, PPI, Oxycodone, Robaxin  LABS: 9/18: Albumin: 3.3, Glu: 103,   Recent Labs  Lab 06/14/18 1152  NA 138  K 4.1  CL 104  CO2 26  BUN 17  CREATININE 0.94  CALCIUM 9.2  GLUCOSE 103*   ANTHROPOMETRICS: Height:  Ht Readings from Last 1 Encounters:  06/15/18 6\' 1"  (1.854 m)   Weight:  Wt Readings from Last 1 Encounters:  06/20/18 177 lb 9.6 oz (80.6 kg)   BMI:  BMI Readings from Last 1 Encounters:  06/20/18 23.43 kg/m   UBW: 200-205 lbs IBW: 83.6 kg  ESTIMATED ENERGY NEEDS:  Kcal: 2400-2600 (30-32 kcal/kg bw) Protein:112-129g Pro (1.4-1.6g/kg bw) Fluid: >2.4 L fluid (30 ml/kg bw)  NUTRITION DIAGNOSIS:  Increased protein/kcal needs related to cancer and cancer related treatments as  evidenced by the estimated nutritional requirements for this condition  DOCUMENTATION CODES:  Not applicable  INTERVENTION:  THough his subjective intake hx is questionable, his objective weight measurement shows he is meeting his kcal/pro requirements. As such, feel it is OK to stop promod modular since he is struggling with administering this. Also, due to his reported increase in abdominal tightness with the increase in his tube feeding from 65 to 85, will turn back. In Total, these changes will reduce his tube feeding by 460 kcals and 25 g Pro.   New TF regimen of Osmolite 1.5 @65  x12 hrs w/ 60  ml flush before and after. Provides: 1170 kcals, 49g Pro and 714 ml fluid.   He notes he is out of Ensure. These are cost prohibitibe. RD provided him his 5th case of Ensure.  He is encouraged to continue trying to lessen dependence on Ensure. Gave handout titled "soft and moist high protein menu ideas". As he says he does not cook, highlighted the easier to prepare and more affordable items.   Will follow up with pt x 3 weeks during 2nd infusion. He is instructed to contact cancer center with any concerns. He already has been given [place]:   Oral intake to meet >90% of estimate needs  wt stability vs gain  patient will demonstrate compliance w/ TF   MONITOR:  Weight, TF tolerance, oral diet tolerance, tolerance to chemotherapy/radiation  Next Visit: 10/15 during infusion  Todd [name] RD, LDN, CNSC Clinical Nutrition Available Tues-Sat via Pager: [phone] 06/20/2018 2:45 PM

## 2018-06-20 NOTE — Progress Notes (Signed)
Created in error

## 2018-06-21 ENCOUNTER — Telehealth (HOSPITAL_COMMUNITY): Payer: Self-pay | Admitting: *Deleted

## 2018-06-21 NOTE — Telephone Encounter (Signed)
Oral Oncology Patient Advocate Encounter  [employer] delivery report shows that medication was not delivered because there was no one there to receive it.   I left Todd [name] a voicemail to let him know to look for [employer] today.  [name] Patient [place] Phone [phone] Fax [phone] 06/21/2018 9:33 AM

## 2018-06-21 NOTE — Telephone Encounter (Signed)
24 hour callback:  Called to check on patient and see how he is doing. He states that he is actually doing very well. He denies any side effects and has no complaints.  He states that he did get his Xeloda in the mail today and will start taking it tonight. He states that he will call should he have any concerns or questions.

## 2018-06-22 ENCOUNTER — Encounter (HOSPITAL_COMMUNITY): Payer: Self-pay | Admitting: *Deleted

## 2018-06-22 NOTE — Progress Notes (Signed)
I spoke with Todd [name] at [facility] and patient has an appointment with Dr. Francesca Jewett on October 8th at 1245.

## 2018-06-25 DIAGNOSIS — R569 Unspecified convulsions: Secondary | ICD-10-CM | POA: Diagnosis not present

## 2018-07-02 ENCOUNTER — Emergency Department (HOSPITAL_COMMUNITY)
Admission: EM | Admit: 2018-07-02 | Discharge: 2018-07-02 | Disposition: A | Discharge status: Home / Self Care | Payer: Medicare Other | Attending: Emergency Medicine | Admitting: Emergency Medicine

## 2018-07-02 ENCOUNTER — Encounter (HOSPITAL_COMMUNITY): Payer: Self-pay | Admitting: Emergency Medicine

## 2018-07-02 DIAGNOSIS — K9423 Gastrostomy malfunction: Secondary | ICD-10-CM | POA: Diagnosis not present

## 2018-07-02 DIAGNOSIS — Z79899 Other long term (current) drug therapy: Secondary | ICD-10-CM | POA: Insufficient documentation

## 2018-07-02 DIAGNOSIS — Z5189 Encounter for other specified aftercare: Secondary | ICD-10-CM

## 2018-07-02 DIAGNOSIS — F17290 Nicotine dependence, other tobacco product, uncomplicated: Secondary | ICD-10-CM | POA: Insufficient documentation

## 2018-07-02 DIAGNOSIS — Z7982 Long term (current) use of aspirin: Secondary | ICD-10-CM | POA: Insufficient documentation

## 2018-07-02 DIAGNOSIS — K9410 Enterostomy complication, unspecified: Secondary | ICD-10-CM | POA: Diagnosis not present

## 2018-07-02 DIAGNOSIS — Z8673 Personal history of transient ischemic attack (TIA), and cerebral infarction without residual deficits: Secondary | ICD-10-CM | POA: Diagnosis not present

## 2018-07-02 DIAGNOSIS — E785 Hyperlipidemia, unspecified: Secondary | ICD-10-CM | POA: Diagnosis not present

## 2018-07-02 DIAGNOSIS — N182 Chronic kidney disease, stage 2 (mild): Secondary | ICD-10-CM | POA: Insufficient documentation

## 2018-07-02 DIAGNOSIS — I129 Hypertensive chronic kidney disease with stage 1 through stage 4 chronic kidney disease, or unspecified chronic kidney disease: Secondary | ICD-10-CM | POA: Insufficient documentation

## 2018-07-02 DIAGNOSIS — I2510 Atherosclerotic heart disease of native coronary artery without angina pectoris: Secondary | ICD-10-CM | POA: Insufficient documentation

## 2018-07-02 MED ORDER — LIDOCAINE HCL (PF) 1 % IJ SOLN
2.0000 mL | Freq: Once | INTRAMUSCULAR | Status: AC
  Administered 2018-07-02: 2 mL
  Filled 2018-07-02: qty 2

## 2018-07-02 NOTE — Discharge Instructions (Addendum)
You were evaluated in the emergency department for evaluation of your J-tube.  1 of the sutures that help secure it to your abdominal skin had broken and the suture was replaced.  Please watch for any signs of infection and follow-up with your doctors as needed.

## 2018-07-02 NOTE — ED Triage Notes (Signed)
Pt presents with concern of stitch loosening on his g tube.  Tube functioning correctly.

## 2018-07-02 NOTE — ED Provider Notes (Signed)
[facility] EMERGENCY DEPARTMENT Provider Note   CSN: [id number] Arrival date & time: 07/02/18  0820     History   Chief Complaint Chief Complaint  Patient presents with  . Wound Check    HPI Todd [name] is a 57 y.o. male.  He presents to the emergency department the problem with his G-tube.  He had this placed in late August for gastric cancer in which he received a Roux-en-Y.  From his surgery standpoint and his disease standpoint he said he is been doing well no fevers no vomiting and is been tolerating tube feeds and some oral feedings also.  He noticed 1 of the 4 stitches that anchor the J-tube to his abdominal wall had broken.  There is been minimal to no discharge and no signs of infection.  He was afraid that if it got worse the G-tube may become malpositioned and he wishes [place] to replace the suture.  No other complaints.  The history is provided by the patient.  Wound Check  This is a new problem. The current episode started yesterday. The problem has not changed since onset.Pertinent negatives include no chest pain, no abdominal pain, no headaches and no shortness of breath. Nothing aggravates the symptoms. Nothing relieves the symptoms. He has tried nothing for the symptoms. The treatment provided no relief.    Past Medical History:  Diagnosis Date  . CAD (coronary artery disease)    a. Cath 07/2012 following abnormal nuclear stress test: 20-30% LAD, 20-30% OM1, occluded branch off OM2 that fills slowly via collaterals, small nondominant RCA occluded proximally, fills slowly via collaterals.  . CKD (chronic kidney disease), stage II   . Family history of cancer   . Gastric adenocarcinoma ([place])   . History of blood transfusion    "when I was a child"  . Hyperlipidemia   . Hypertension   . Peripheral vascular disease ([place])    a. bilateral superficial femoral artery stents in May/August of 2012. b. Left femoral to above-knee popliteal bypass in 08/2012.  Marland Kitchen Plantar  fasciitis   . Renal insufficiency   . Stroke [facility])    2008 right side weakness  . Tobacco abuse     Patient Active Problem List   Diagnosis Date Noted  . Elevated lipase 03/17/2018  . Hypotension 03/17/2018  . Leukocytosis 03/17/2018  . Gastric carcinoma ([place]) 01/23/2018  . Genetic testing 12/20/2017  . Family history of cancer   . IBS (irritable bowel syndrome) 11/28/2017  . Malignant neoplasm of stomach ([place]) 11/11/2017  . Constipation 08/30/2017  . GERD (gastroesophageal reflux disease) 08/30/2017  . Globus sensation 08/30/2017  . Cerebral artery occlusion with cerebral infarction ([place]) 12/20/2016  . PAD (peripheral artery disease) ([place]) 08/09/2014  . CAD (coronary artery disease) 07/22/2014  . Tobacco abuse 07/22/2014  . 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 Pure hypercholesterolemia 05/28/2014  . Hypertension 07/19/2012  . Hyperlipidemia 07/19/2012  . PVD (peripheral vascular disease) with claudication ([place]) 06/29/2012  . Atherosclerosis of native arteries of the extremities with intermittent claudication 04/20/2012    Past Surgical History:  Procedure Laterality Date  . ABDOMINAL AORTAGRAM N/A 04/27/2012   Procedure: ABDOMINAL Maxcine Ham;  Surgeon: Conrad Sharon, MD;  Location: [facility] CATH LAB;  Service: Cardiovascular;  Laterality: N/A;  . ABDOMINAL AORTAGRAM N/A 08/09/2014   Procedure: ABDOMINAL Maxcine Ham;  Surgeon: Elam Dutch, MD;  Location: [facility] CATH LAB;  Service: Cardiovascular;  Laterality: N/A;  . BIOPSY  10/06/2017   Procedure: BIOPSY;  Surgeon: Daneil Dolin, MD;  Location: [facility] ENDO SUITE;  Service: Endoscopy;;  gastric   . CARDIAC CATHETERIZATION    . COLONOSCOPY    . COLONOSCOPY WITH PROPOFOL N/A 10/06/2017   Procedure: COLONOSCOPY WITH PROPOFOL;  Surgeon: Daneil Dolin, MD;  Location: [facility] ENDO SUITE;  Service: Endoscopy;  Laterality: N/A;  1:45pm - patient can't come earlier due to transportation  . ESOPHAGOGASTRODUODENOSCOPY (EGD) WITH  PROPOFOL N/A 10/06/2017   Procedure: ESOPHAGOGASTRODUODENOSCOPY (EGD) WITH PROPOFOL;  Surgeon: Daneil Dolin, MD;  Location: [facility] ENDO SUITE;  Service: Endoscopy;  Laterality: N/A;  . EUS N/A 11/24/2017   Procedure: UPPER ENDOSCOPIC ULTRASOUND (EUS) RADIAL;  Surgeon: Milus Banister, MD;  Location: [facility] ENDOSCOPY;  Service: Endoscopy;  Laterality: N/A;  . FEMORAL ARTERY - POPLITEAL ARTERY BYPASS GRAFT    . FEMORAL-POPLITEAL BYPASS GRAFT  09/26/2012   Procedure: BYPASS GRAFT FEMORAL-POPLITEAL ARTERY;  Surgeon: Elam Dutch, MD;  Location: [facility] OR;  Service: Vascular;  Laterality: Left;  using 56mm Gore-Tex Propaten Ringed Graft, Vein patch on the proximal and distal anastomosis  . FEMORAL-POPLITEAL BYPASS GRAFT Right 08/12/2014   Procedure:  FEMORAL-POPLITEAL ARTERY BYPASS WITH SAPHENOUS VEIN GRAFT, INTRAOPERATIVE ARTERIOGRAM;  Surgeon: Elam Dutch, MD;  Location: [place];  Service: Vascular;  Laterality: Right;  . GASTRECTOMY N/A 05/04/2018   Procedure: Total Gastrectomy with Feeding Jejunostomy;  Surgeon: [name], MD;  Location: [place];  Service: General;  Laterality: N/A;  . INTRAOPERATIVE ARTERIOGRAM  08/12/2014   Procedure: INTRA OPERATIVE ARTERIOGRAM;  Surgeon: Elam Dutch, MD;  Location: [place];  Service: Vascular;;  . LAPAROSCOPY N/A 05/04/2018   Procedure: LAPAROSCOPY DIAGNOSTIC ERAS PATHWAY;  Surgeon: [name], MD;  Location: [place];  Service: General;  Laterality: N/A;  EPIDURAL  . POLYPECTOMY  10/06/2017   Procedure: POLYPECTOMY;  Surgeon: Daneil Dolin, MD;  [facility];  Service: Endoscopy;;  splenic flexure,  ascending  . PORTACATH PLACEMENT N/A 01/24/2018   Procedure: INSERTION PORT-A-CATH;  Surgeon: [name], MD;  Location: [place];  Service: General;  Laterality: N/A;  . PR VEIN BYPASS GRAFT,AORTO-FEM-POP    . stents in bil legs          Home Medications    Prior to Admission medications   Medication Sig Start Date End Date Taking? Authorizing  Provider  Amino Acids-Protein Hydrolys (FEEDING SUPPLEMENT, PRO-STAT SUGAR FREE 64,) LIQD Place 30 mLs into feeding tube 3 (three) times daily. 05/16/18   [name], MD  aspirin EC 81 MG tablet Take 81 mg by mouth daily.    [provider]  capecitabine (XELODA) 500 MG tablet Take 4 tablets (2,000 mg total) by mouth 2 (two) times daily after a meal. Take for 14 days on, then 7 days off. 06/19/18   Higgs, Mathis Dad, MD  chlorproMAZINE (THORAZINE) 10 MG tablet Take 1 tablet (10 mg total) by mouth 4 (four) times daily as needed for hiccoughs. 05/16/18   [name], MD  CISPLATIN IV Inject into the vein.    [provider]  feeding supplement, ENSURE ENLIVE, (ENSURE ENLIVE) LIQD Take 237 mLs by mouth 2 (two) times daily between meals. 05/16/18   [name], MD  lidocaine-prilocaine (EMLA) cream Apply to affected area once 06/19/18   Higgs, Mathis Dad, MD  methocarbamol (ROBAXIN) 500 MG tablet Take 1 tablet (500 mg total) by mouth every 6 (six) hours as needed for muscle spasms. 05/16/18   [name], MD  Nutritional Supplements (FEEDING SUPPLEMENT, OSMOLITE 1.5 CAL,)  LIQD Place 1,000 mLs into feeding tube daily. 05/16/18   [name], MD  nystatin (MYCOSTATIN) 100000 UNIT/ML suspension TAKE 5 ML BY MOUTH THREE TIMES DAILY 06/02/18   [provider]  ondansetron (ZOFRAN) 8 MG tablet Take 1 tablet (8 mg total) by mouth 2 (two) times daily as needed. Start on the third day after chemotherapy. 06/19/18   Higgs, Mathis Dad, MD  oxyCODONE (OXY IR/ROXICODONE) 5 MG immediate release tablet Take 1 tablet (5 mg total) by mouth every 6 (six) hours as needed. 06/14/18   Higgs, Mathis Dad, MD  oxyCODONE (ROXICODONE) 5 MG/5ML solution Take 5-10 mLs (5-10 mg total) by mouth every 4 (four) hours as needed for moderate pain or severe pain. 05/16/18   [name], MD  pantoprazole (PROTONIX) 40 MG tablet Take 1 tablet (40 mg total) by mouth daily at 12 noon. 05/16/18   [name], MD  prochlorperazine  (COMPAZINE) 10 MG tablet Take 1 tablet (10 mg total) by mouth every 6 (six) hours as needed (Nausea or vomiting). 06/19/18   Higgs, Mathis Dad, MD  valACYclovir (VALTREX) 500 MG tablet Take 500 mg by mouth at bedtime.    [provider]    Family History Family History  Problem Relation Age of Onset  . Diabetes Mother   . Heart disease Mother        before age 59- Triple BPG  . Cancer Mother        Luekemia  . Deep vein thrombosis Mother   . Hyperlipidemia Mother   . Hypertension Mother   . Heart attack Mother   . Peripheral vascular disease Mother   . Stroke Mother        x 3  . Pancreatic cancer Mother   . Arthritis Father   . Hypertension Father   . Kidney disease Father   . Cancer Maternal Grandmother        NOS  . Diabetes Cousin   . Cancer Cousin        NOS, pat first cousin  . Cancer Cousin        NOS, pat first cousin  . Colon cancer Neg Hx   . Gastric cancer Neg Hx   . Esophageal cancer Neg Hx     Social History Social History   Tobacco Use  . Smoking status: Current Every Day Smoker    Packs/day: 1.00    Years: 25.00    Pack years: 25.00    Types: Cigars    Start date: 11/16/1974  . Smokeless tobacco: Never Used  . Tobacco comment: Black and Mild cigars  Substance Use Topics  . Alcohol use: No    Alcohol/week: 0.0 standard drinks  . Drug use: Yes    Types: Marijuana    Comment: last use 05/03/18     Allergies   Potassium-containing compounds   Review of Systems Review of Systems  Constitutional: Negative for fever.  HENT: Negative for sore throat.   Eyes: Negative for visual disturbance.  Respiratory: Negative for shortness of breath.   Cardiovascular: Negative for chest pain.  Gastrointestinal: Negative for abdominal pain.  Genitourinary: Negative for dysuria.  Musculoskeletal: Negative for neck pain.  Skin: Positive for wound. Negative for rash.  Neurological: Negative for headaches.     Physical Exam Updated Vital Signs BP  112/88 (BP Location: Right Arm)   Pulse 100   Temp 98 F (36.7 C) (Oral)   Resp 16   Ht 6\' 1"  (1.854 m)   Wt 81 kg  SpO2 100%   BMI 23.56 kg/m   Physical Exam  Constitutional: He appears well-developed and well-nourished.  HENT:  Head: Normocephalic and atraumatic.  Eyes: Conjunctivae are normal.  Neck: Neck supple.  Pulmonary/Chest: Effort normal.  Abdominal: Soft. There is no tenderness. There is no guarding.  He has a J-tube in place and his left mid abdomen.  There are 3 sutures anchoring the flange to his abdominal wall.  One other suture had broken.  There is no drainage from the area.  Neurological: He is alert. GCS eye subscore is 4. GCS verbal subscore is 5. GCS motor subscore is 6.  Skin: Skin is warm and dry.  Psychiatric: He has a normal mood and affect.  Nursing note and vitals reviewed.    ED Treatments / Results  Labs (all labs ordered are listed, but only abnormal results are displayed) Labs Reviewed - No data to display  EKG None  Radiology No results found.  Procedures Procedures (including critical care time)  Medications Ordered in ED Medications - No data to display   Initial Impression / Assessment and Plan / ED Course  I have reviewed the triage vital signs and the nursing notes.  Pertinent labs & imaging results that were available during my care of the patient were reviewed by me and considered in my medical decision making (see chart for details).  Clinical Course as of Jul 03 1835  Sun Jul 02, 2018  0934 Procedure-placed single suture to reinforce the bumper of his J-tube.  Betadine prep, 1% lidocaine without epi 1 cc local to skin wheal.  1-0 silk suture anchor to skin and secured to bumper.  Patient tolerated well.   [MB]    Clinical Course User Index [MB] Hayden Rasmussen, MD     Final Clinical Impressions(s) / ED Diagnoses   Final diagnoses:  Visit for wound check    ED Discharge Orders    None       Hayden Rasmussen, MD 07/02/18 1836

## 2018-07-04 DIAGNOSIS — C163 Malignant neoplasm of pyloric antrum: Secondary | ICD-10-CM | POA: Diagnosis not present

## 2018-07-04 DIAGNOSIS — Z79899 Other long term (current) drug therapy: Secondary | ICD-10-CM | POA: Diagnosis not present

## 2018-07-05 ENCOUNTER — Inpatient Hospital Stay (HOSPITAL_COMMUNITY): Payer: Medicare Other | Attending: Internal Medicine | Admitting: Internal Medicine

## 2018-07-05 ENCOUNTER — Encounter (HOSPITAL_COMMUNITY): Payer: Self-pay | Admitting: Emergency Medicine

## 2018-07-05 ENCOUNTER — Encounter (HOSPITAL_COMMUNITY): Payer: Self-pay | Admitting: Internal Medicine

## 2018-07-05 ENCOUNTER — Other Ambulatory Visit: Payer: Self-pay

## 2018-07-05 ENCOUNTER — Emergency Department (HOSPITAL_COMMUNITY)
Admission: EM | Admit: 2018-07-05 | Discharge: 2018-07-05 | Disposition: A | Discharge status: Home / Self Care | Payer: Medicare Other | Attending: Emergency Medicine | Admitting: Emergency Medicine

## 2018-07-05 VITALS — BP 117/78 | HR 101 | Temp 98.5°F | Resp 18 | Wt 173.5 lb

## 2018-07-05 DIAGNOSIS — R109 Unspecified abdominal pain: Secondary | ICD-10-CM | POA: Insufficient documentation

## 2018-07-05 DIAGNOSIS — Z79899 Other long term (current) drug therapy: Secondary | ICD-10-CM | POA: Insufficient documentation

## 2018-07-05 DIAGNOSIS — I129 Hypertensive chronic kidney disease with stage 1 through stage 4 chronic kidney disease, or unspecified chronic kidney disease: Secondary | ICD-10-CM | POA: Insufficient documentation

## 2018-07-05 DIAGNOSIS — F17210 Nicotine dependence, cigarettes, uncomplicated: Secondary | ICD-10-CM

## 2018-07-05 DIAGNOSIS — F17290 Nicotine dependence, other tobacco product, uncomplicated: Secondary | ICD-10-CM | POA: Insufficient documentation

## 2018-07-05 DIAGNOSIS — K9420 Gastrostomy complication, unspecified: Secondary | ICD-10-CM

## 2018-07-05 DIAGNOSIS — R066 Hiccough: Secondary | ICD-10-CM | POA: Diagnosis not present

## 2018-07-05 DIAGNOSIS — I714 Abdominal aortic aneurysm, without rupture: Secondary | ICD-10-CM | POA: Insufficient documentation

## 2018-07-05 DIAGNOSIS — Z5111 Encounter for antineoplastic chemotherapy: Secondary | ICD-10-CM | POA: Insufficient documentation

## 2018-07-05 DIAGNOSIS — N182 Chronic kidney disease, stage 2 (mild): Secondary | ICD-10-CM | POA: Insufficient documentation

## 2018-07-05 DIAGNOSIS — R197 Diarrhea, unspecified: Secondary | ICD-10-CM | POA: Diagnosis not present

## 2018-07-05 DIAGNOSIS — Z8673 Personal history of transient ischemic attack (TIA), and cerebral infarction without residual deficits: Secondary | ICD-10-CM | POA: Diagnosis not present

## 2018-07-05 DIAGNOSIS — Z8249 Family history of ischemic heart disease and other diseases of the circulatory system: Secondary | ICD-10-CM | POA: Insufficient documentation

## 2018-07-05 DIAGNOSIS — F1210 Cannabis abuse, uncomplicated: Secondary | ICD-10-CM | POA: Diagnosis not present

## 2018-07-05 DIAGNOSIS — K9423 Gastrostomy malfunction: Secondary | ICD-10-CM | POA: Diagnosis not present

## 2018-07-05 DIAGNOSIS — Z85028 Personal history of other malignant neoplasm of stomach: Secondary | ICD-10-CM | POA: Diagnosis not present

## 2018-07-05 DIAGNOSIS — C169 Malignant neoplasm of stomach, unspecified: Secondary | ICD-10-CM | POA: Diagnosis not present

## 2018-07-05 DIAGNOSIS — Z9119 Patient's noncompliance with other medical treatment and regimen: Secondary | ICD-10-CM | POA: Insufficient documentation

## 2018-07-05 DIAGNOSIS — Z7982 Long term (current) use of aspirin: Secondary | ICD-10-CM | POA: Insufficient documentation

## 2018-07-05 DIAGNOSIS — G4700 Insomnia, unspecified: Secondary | ICD-10-CM | POA: Diagnosis not present

## 2018-07-05 DIAGNOSIS — I2510 Atherosclerotic heart disease of native coronary artery without angina pectoris: Secondary | ICD-10-CM | POA: Insufficient documentation

## 2018-07-05 DIAGNOSIS — Z931 Gastrostomy status: Secondary | ICD-10-CM | POA: Diagnosis present

## 2018-07-05 MED ORDER — LIDOCAINE HCL (PF) 2 % IJ SOLN
10.0000 mL | Freq: Once | INTRAMUSCULAR | Status: AC
  Administered 2018-07-05: 10 mL

## 2018-07-05 MED ORDER — OXYCODONE HCL 5 MG PO TABS: 5.0000 mg | ORAL_TABLET | Freq: Four times a day (QID) | ORAL | 0 refills | Status: DC | PRN

## 2018-07-05 MED ORDER — LIDOCAINE HCL (PF) 2 % IJ SOLN
INTRAMUSCULAR | Status: AC
  Administered 2018-07-05: 10 mL
  Filled 2018-07-05: qty 20

## 2018-07-05 NOTE — Discharge Instructions (Addendum)
Your gastrostomy tube was anchored with silk sutures today.  Please have this rechecked by your primary physician.  See your primary physician if any signs of advancing infection.  Return to the emergency department if any changes in your condition.

## 2018-07-05 NOTE — ED Triage Notes (Signed)
J tube secured with only one stitch.

## 2018-07-05 NOTE — ED Provider Notes (Signed)
[facility] EMERGENCY DEPARTMENT Provider Note   CSN: [id number] Arrival date & time: 07/05/18  0745     History   Chief Complaint No chief complaint on file.   HPI Todd [name] is a 57 y.o. male.  Patient is a 57 year old male who presents to the emergency department for evaluation of his gastrostomy tube.  The patient unfortunately suffers from gastric cancer.  He has had a Roux-en-Y surgical procedure, and seems to be doing well with this.  He has a gastrostomy tube in place.  The patient states that approximately a week ago 1 of the sutures stabilizing the 2 head, loose.  He says now that 3 or 4 the others sutures have now, loose, and he is requesting these to be replaced.  Is not had any drainage.  No fever.  No vomiting.  No unusual distention.  The history is provided by the patient.    Past Medical History:  Diagnosis Date  . CAD (coronary artery disease)    a. Cath 07/2012 following abnormal nuclear stress test: 20-30% LAD, 20-30% OM1, occluded branch off OM2 that fills slowly via collaterals, small nondominant RCA occluded proximally, fills slowly via collaterals.  . CKD (chronic kidney disease), stage II   . Family history of cancer   . Gastric adenocarcinoma ([place])   . History of blood transfusion    "when I was a child"  . Hyperlipidemia   . Hypertension   . Peripheral vascular disease ([place])    a. bilateral superficial femoral artery stents in May/August of 2012. b. Left femoral to above-knee popliteal bypass in 08/2012.  Marland Kitchen Plantar fasciitis   . Renal insufficiency   . Stroke [facility])    2008 right side weakness  . Tobacco abuse     Patient Active Problem List   Diagnosis Date Noted  . Elevated lipase 03/17/2018  . Hypotension 03/17/2018  . Leukocytosis 03/17/2018  . Gastric carcinoma ([place]) 01/23/2018  . Genetic testing 12/20/2017  . Family history of cancer   . IBS (irritable bowel syndrome) 11/28/2017  . Malignant neoplasm of stomach ([place]) 11/11/2017    . Constipation 08/30/2017  . GERD (gastroesophageal reflux disease) 08/30/2017  . Globus sensation 08/30/2017  . Cerebral artery occlusion with cerebral infarction ([place]) 12/20/2016  . PAD (peripheral artery disease) ([place]) 08/09/2014  . CAD (coronary artery disease) 07/22/2014  . Tobacco abuse 07/22/2014  . 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 Pure hypercholesterolemia 05/28/2014  . Hypertension 07/19/2012  . Hyperlipidemia 07/19/2012  . PVD (peripheral vascular disease) with claudication ([place]) 06/29/2012  . Atherosclerosis of native arteries of the extremities with intermittent claudication 04/20/2012    Past Surgical History:  Procedure Laterality Date  . ABDOMINAL AORTAGRAM N/A 04/27/2012   Procedure: ABDOMINAL Maxcine Ham;  Surgeon: Conrad Gerber, MD;  Location: [facility] CATH LAB;  Service: Cardiovascular;  Laterality: N/A;  . ABDOMINAL AORTAGRAM N/A 08/09/2014   Procedure: ABDOMINAL Maxcine Ham;  Surgeon: Elam Dutch, MD;  Location: [facility] CATH LAB;  Service: Cardiovascular;  Laterality: N/A;  . BIOPSY  10/06/2017   Procedure: BIOPSY;  Surgeon: Daneil Dolin, MD;  Location: [facility] ENDO SUITE;  Service: Endoscopy;;  gastric   . CARDIAC CATHETERIZATION    . COLONOSCOPY    . COLONOSCOPY WITH PROPOFOL N/A 10/06/2017   Procedure: COLONOSCOPY WITH PROPOFOL;  Surgeon: Daneil Dolin, MD;  Location[facility] ENDO [facility];  Service: Endoscopy;  Laterality: N/A;  1:45pm - patient can't come earlier due to transportation  . ESOPHAGOGASTRODUODENOSCOPY (EGD)  WITH PROPOFOL N/A 10/06/2017   Procedure: ESOPHAGOGASTRODUODENOSCOPY (EGD) WITH PROPOFOL;  Surgeon: Daneil Dolin, MD;  [facility];  Service: Endoscopy;  Laterality: N/A;  . EUS N/A 11/24/2017   Procedure: UPPER ENDOSCOPIC ULTRASOUND (EUS) RADIAL;  Surgeon: Milus Banister, MD;  Location: [facility] ENDOSCOPY;  Service: Endoscopy;  Laterality: N/A;  . FEMORAL ARTERY - POPLITEAL ARTERY BYPASS GRAFT    . FEMORAL-POPLITEAL BYPASS GRAFT   09/26/2012   Procedure: BYPASS GRAFT FEMORAL-POPLITEAL ARTERY;  Surgeon: Elam Dutch, MD;  Location: [facility] OR;  Service: Vascular;  Laterality: Left;  using 29mm Gore-Tex Propaten Ringed Graft, Vein patch on the proximal and distal anastomosis  . FEMORAL-POPLITEAL BYPASS GRAFT Right 08/12/2014   Procedure:  FEMORAL-POPLITEAL ARTERY BYPASS WITH SAPHENOUS VEIN GRAFT, INTRAOPERATIVE ARTERIOGRAM;  Surgeon: Elam Dutch, MD;  Location: [place];  Service: Vascular;  Laterality: Right;  . GASTRECTOMY N/A 05/04/2018   Procedure: Total Gastrectomy with Feeding Jejunostomy;  Surgeon: [name], MD;  Location: [place];  Service: General;  Laterality: N/A;  . INTRAOPERATIVE ARTERIOGRAM  08/12/2014   Procedure: INTRA OPERATIVE ARTERIOGRAM;  Surgeon: Elam Dutch, MD;  Location: [place];  Service: Vascular;;  . LAPAROSCOPY N/A 05/04/2018   Procedure: LAPAROSCOPY DIAGNOSTIC ERAS PATHWAY;  Surgeon: [name], MD;  Location: [place];  Service: General;  Laterality: N/A;  EPIDURAL  . POLYPECTOMY  10/06/2017   Procedure: POLYPECTOMY;  Surgeon: Daneil Dolin, MD;  [facility];  Service: Endoscopy;;  splenic flexure,  ascending  . PORTACATH PLACEMENT N/A 01/24/2018   Procedure: INSERTION PORT-A-CATH;  Surgeon: [name], MD;  Location: [place];  Service: General;  Laterality: N/A;  . PR VEIN BYPASS GRAFT,AORTO-FEM-POP    . stents in bil legs          Home Medications    Prior to Admission medications   Medication Sig Start Date End Date Taking? Authorizing Provider  Amino Acids-Protein Hydrolys (FEEDING SUPPLEMENT, PRO-STAT SUGAR FREE 64,) LIQD Place 30 mLs into feeding tube 3 (three) times daily. 05/16/18   [name], MD  aspirin EC 81 MG tablet Take 81 mg by mouth daily.    [provider]  capecitabine (XELODA) 500 MG tablet Take 4 tablets (2,000 mg total) by mouth 2 (two) times daily after a meal. Take for 14 days on, then 7 days off. 06/19/18   Higgs, Mathis Dad, MD    chlorproMAZINE (THORAZINE) 10 MG tablet Take 1 tablet (10 mg total) by mouth 4 (four) times daily as needed for hiccoughs. 05/16/18   [name], MD  CISPLATIN IV Inject into the vein.    [provider]  feeding supplement, ENSURE ENLIVE, (ENSURE ENLIVE) LIQD Take 237 mLs by mouth 2 (two) times daily between meals. 05/16/18   [name], MD  lidocaine-prilocaine (EMLA) cream Apply to affected area once 06/19/18   Higgs, Mathis Dad, MD  methocarbamol (ROBAXIN) 500 MG tablet Take 1 tablet (500 mg total) by mouth every 6 (six) hours as needed for muscle spasms. 05/16/18   [name], MD  Nutritional Supplements (FEEDING SUPPLEMENT, OSMOLITE 1.5 CAL,) LIQD Place 1,000 mLs into feeding tube daily. 05/16/18   [name], MD  nystatin (MYCOSTATIN) 100000 UNIT/ML suspension TAKE 5 ML BY MOUTH THREE TIMES DAILY 06/02/18   [provider]  ondansetron (ZOFRAN) 8 MG tablet Take 1 tablet (8 mg total) by mouth 2 (two) times daily as needed. Start on the third day after chemotherapy. 06/19/18   Higgs, Mathis Dad, MD  oxyCODONE (OXY  IR/ROXICODONE) 5 MG immediate release tablet Take 1 tablet (5 mg total) by mouth every 6 (six) hours as needed. 06/14/18   Higgs, Mathis Dad, MD  oxyCODONE (ROXICODONE) 5 MG/5ML solution Take 5-10 mLs (5-10 mg total) by mouth every 4 (four) hours as needed for moderate pain or severe pain. 05/16/18   [name], MD  pantoprazole (PROTONIX) 40 MG tablet Take 1 tablet (40 mg total) by mouth daily at 12 noon. 05/16/18   [name], MD  prochlorperazine (COMPAZINE) 10 MG tablet Take 1 tablet (10 mg total) by mouth every 6 (six) hours as needed (Nausea or vomiting). 06/19/18   Higgs, Mathis Dad, MD  valACYclovir (VALTREX) 500 MG tablet Take 500 mg by mouth at bedtime.    [provider]    Family History Family History  Problem Relation Age of Onset  . Diabetes Mother   . Heart disease Mother        before age 55- Triple BPG  . Cancer Mother        Luekemia  . Deep  vein thrombosis Mother   . Hyperlipidemia Mother   . Hypertension Mother   . Heart attack Mother   . Peripheral vascular disease Mother   . Stroke Mother        x 3  . Pancreatic cancer Mother   . Arthritis Father   . Hypertension Father   . Kidney disease Father   . Cancer Maternal Grandmother        NOS  . Diabetes Cousin   . Cancer Cousin        NOS, pat first cousin  . Cancer Cousin        NOS, pat first cousin  . Colon cancer Neg Hx   . Gastric cancer Neg Hx   . Esophageal cancer Neg Hx     Social History Social History   Tobacco Use  . Smoking status: Current Every Day Smoker    Packs/day: 1.00    Years: 25.00    Pack years: 25.00    Types: Cigars    Start date: 11/16/1974  . Smokeless tobacco: Never Used  . Tobacco comment: Black and Mild cigars  Substance Use Topics  . Alcohol use: No    Alcohol/week: 0.0 standard drinks  . Drug use: Yes    Types: Marijuana    Comment: last use 05/03/18     Allergies   Potassium-containing compounds   Review of Systems Review of Systems  Constitutional: Negative for activity change, chills and fever.       All ROS Neg except as noted in HPI  HENT: Negative for nosebleeds.   Eyes: Negative for photophobia and discharge.  Respiratory: Negative for cough, shortness of breath and wheezing.   Cardiovascular: Negative for chest pain and palpitations.  Gastrointestinal: Negative for abdominal pain, blood in stool, nausea and vomiting.  Genitourinary: Negative for dysuria, frequency and hematuria.  Musculoskeletal: Negative for arthralgias, back pain and neck pain.  Skin: Positive for wound.  Neurological: Negative for dizziness, seizures and speech difficulty.  Psychiatric/Behavioral: Negative for confusion and hallucinations.     Physical Exam Updated Vital Signs BP 121/87 (BP Location: Left Arm)   Pulse 100   Temp 97.9 F (36.6 C) (Oral)   Resp 18   Ht 6\' 1"  (1.854 m)   Wt 81 kg   SpO2 100%   BMI 23.56 kg/m    Physical Exam  Constitutional: He is oriented to person, place, and time. He appears well-developed and well-nourished.  Non-toxic appearance.  HENT:  Head: Normocephalic.  Right Ear: Tympanic membrane and external ear normal.  Left Ear: Tympanic membrane and external ear normal.  Eyes: Pupils are equal, round, and reactive to light. EOM and lids are normal.  Neck: Normal range of motion. Neck supple. Carotid bruit is not present.  Cardiovascular: Normal rate, regular rhythm, normal heart sounds, intact distal pulses and normal pulses.  Pulmonary/Chest: Breath sounds normal. No respiratory distress.  Abdominal: Soft. Bowel sounds are normal. There is no tenderness. There is no guarding.  Patient has a gastrostomy tube in the left mid abdomen.  The gastrostomy tube is functioning well.  At the gastrostomy site, there is no red streaking appreciated.  No puslike drainage.  There is a scab present.  The patient has a bumper to stabilize the gastrostomy tube, and 3 of the 4 sutures have come loose.  Musculoskeletal: Normal range of motion.  Lymphadenopathy:       Head (right side): No submandibular adenopathy present.       Head (left side): No submandibular adenopathy present.    He has no cervical adenopathy.  Neurological: He is alert and oriented to person, place, and time. He has normal strength. No cranial nerve deficit or sensory deficit.  Skin: Skin is warm and dry.  Psychiatric: He has a normal mood and affect. His speech is normal.  Nursing note and vitals reviewed.    ED Treatments / Results  Labs (all labs ordered are listed, but only abnormal results are displayed) Labs Reviewed - No data to display  EKG None  Radiology No results found.  Procedures Procedures (including critical care time) SECURE AND REPAIR OF GASTROSTOMY TUBE. Patient has a gastrostomy tube in the left mid abdomen due to previous gastric cancer.  The bumper used to secure the gastrostomy tube had  3 of the sutures that had come loose.  There is only one suture remaining.  The patient requested these be replaced and the bumper be tacked back down.  Patient was identified by armband.  Patient gives permission for the procedure.  Procedural timeout taken. The gastrostomy site was painted with Betadine.  Sterile field applied.  It was then infiltrated with 2% plain lidocaine.  There were several scabbed areas present these were removed.  The area was then cleansed with saline, and then painted with chlorhexidine.    3 sutures were replaced on the bumper for the gastrostomy tube using one 0-0 silk.  After the procedure the tube seems to be in good position by noting previous position of the gastrostomy tube, and also by an air bolus..  The bumper seems to be in good position.  It is not irritating at this time.  Patient tolerated the procedure without problem. Medications Ordered in ED Medications  lidocaine (XYLOCAINE) 2 % injection 10 mL (has no administration in time range)     Initial Impression / Assessment and Plan / ED Course  I have reviewed the triage vital signs and the nursing notes.  Pertinent labs & imaging results that were available during my care of the patient were reviewed by me and considered in my medical decision making (see chart for details).      Final Clinical Impressions(s) / ED Diagnoses MDM  Vital signs are within normal limits.  Pulse oximetry is 100% on room air.  The patient had 3 of the 4 sutures to, loose on his bumper of the gastrostomy tube.  3 of these were replaced with 1-0 silk  sutures.  The tube seems to be in good position, and the bumper also in good position.  The patient tolerated the procedure without problem.  Dressing applied.  Patient will see the primary physician or return to the emergency department if any changes in condition, problems, or concerns.   Final diagnoses:  Problem with gastrostomy tube [facility])    ED Discharge Orders    None        Lily Kocher, PA-C 07/05/18 1000    Virgel Manifold, MD 07/05/18 1422

## 2018-07-05 NOTE — ED Notes (Signed)
Pt walked out of room, left the hospital without letting anyone know.  Unable to obtain vitals or sign for discharge.

## 2018-07-05 NOTE — Progress Notes (Signed)
Diagnosis Gastric carcinoma ([place]) - Plan: CBC with Differential/Platelet, Comprehensive metabolic panel, Lactate dehydrogenase, CBC with Differential/Platelet, Comprehensive metabolic panel, Lactate dehydrogenase  Staging Cancer Staging No matching staging information was found for the patient.  Assessment and Plan:  1.  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  57 y.o. male initially seen by Dr. Sherrine Maples and reported he was in his usual state of health until around Christmas 2018 when he went camping in [place].  During that trip patient apparently had acute onset of epigastric pain associated with vomiting.  He reports retching with vomiting.  Sometime during that episode patient apparently attempted to put his fingers in the back of his throat to relieve the sensation of food being lodged thinking that he may have had a fish bone that was stuck in his throat.  This culminated in vomiting with small amount of blood.  He subsequently had relief from vomiting but he continued to have difficulty swallowing and hoarseness of voice.  Imaging of the neck showed posterior pharyngeal edema without evidence of any foreign object or fishbone.  The pain subsequently improved gradually.    He underwent a colonoscopy that showed tubular adenomas.  An EGD that revealed diffusely infiltrated wall of the stomach with superficial ulcerations.  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    PET scan was done 10/24/2017 and showed IMPRESSION: 1. Small focus of hypermetabolism along the lesser curvature of the antrum of the stomach, corresponding to recently diagnosed gastric neoplasm. No associated lymphadenopathy or definite findings to suggest metastatic disease to the neck, chest, abdomen or pelvis. 2. Aortic atherosclerosis, in addition to left main and 3 vessel coronary artery disease. Please note that although the  presence of coronary artery calcium documents the presence of coronary artery disease, the severity of this disease and any potential stenosis cannot be assessed on this non-gated CT examination. Assessment for potential risk factor modification, dietary therapy or pharmacologic therapy may be warranted, if clinically indicated. 3. In addition, there is fusiform aneurysmal dilatation of the infrarenal abdominal aorta which measures up to 3.9 x 3.7 cm. Recommend followup by ultrasound in 2 years. This recommendation follows ACR consensus guidelines: White Paper of the ACR Incidental Findings Committee II on Vascular Findings. J Am Coll Radiol 2013; 10:789-794.  He Is followed by Dr. Barry Dienes of surgery.  He initially indicated he did not desire surgery or chemotherapy.   Pt has diffuse gastric adenocarcinoma that appears to be localized to the stomach.  Histologically he appears to have a signet ring variety with diffuse involvement.  He was treated with neoadjuvant chemotherapy followed by surgery followed by adjuvant chemotherapy.  Previously, I have discussed with him that even if he were to be treated with surgery alone based on the diffuse nature of his cancer he was still be recommended for adjuvant therapy.    He was treated with FLOT regimen (5-FU dose at 2600 mg/m with 24 hours, oxaliplatin 85 mg/m, leucovorin 200 mg/m, and Taxotere 50 mg/m all on day 1 every 2 weeks for 4 cycles before surgery and 4 cycles following surgery.   PET scan done 10/24/2017 showed no evidence of distant disease.    EUS that was done 11/24/2017 showed:  Normal esophagus, ulcerated mass involving the lesser curvature of the stomach approximately mid body.  Mass measured 5 cm across 5 cm in length.  Mass appeared to be 7- 8 cm from the pylorus.  It was also 5 to 6 cm from the GE junction.  It appeared to pass into and through the muscularis propria as well as the serosa making it a T4 a lesion.  Mass was 1.6  cm in maximal thickness.  No adenopathy was noted.  He was staged as a T4 a N0 by EUS.    He has undergone genetic testing that was negative.     He has completed 4 cycles of FLOT.  Pt failed to keep his follow-up to go over PET scan and was referred to Dr. Barry Dienes for surgical evaluation.  He was planned for surgery on 04/26/2018.    Pt cancelled surgery and wanted  to wait until next year for operation.  He was also reporting port problems.    Pet scan done 03/20/2018 showed IMPRESSION: 1. No evidence of residual gastric carcinoma. 2. No evidence of carcinoma metastasis in the chest, abdomen, or pelvis. 3. Coronary artery calcification and Aortic Atherosclerosis (ICD10-I70.0).  Previously I discussed with him scan findings are due to chemotherapy and if he remains off treatment for a significant amount of time he has a significant risk for morbidity and mortality if he continues to be noncompliant with recommendations.  I have discussed with him he has a significant risk for death from advanced gastric cancer if he continues to defer surgery and adjuvant chemotherapy especially until next year which is what he was requesting.    After discussion he continued to defer surgery until after 04/29/2018.   Pt underwent total gastrectomy on 05/04/2018 Pathology returned as  1. Stomach, resection for tumor - INVASIVE ADENOCARCINOMA, POORLY DIFFERENTIATED, SPANNING AT LEAST 3.2 CM. - ADENOCARCINOMA IS PRESENT AT THE PROXIMAL MARGIN OF SPECIMEN 1. - LYMPHOVASCULAR INVASION IS IDENTIFIED. - PERINEURAL INVASION IS IDENTIFIED. - ADENOCARICNOMA EXTENDS THROUGH THE MUSCULARIS PROPRIA AND INTO SOFT TISSUE WITH PERITONEAL INVOLVEMENT. - METASTATIC CARCINOMA IN 6 OF 6 LYMPH NODES (6/6). - SEE ONCOLOGY TABLE BELOW. 2. Stomach, resection, Additional proximal margin - INVASIVE ADENOCARCINOMA, PRESENT AT THE PROXIMAL MARGIN OF SPECIMEN 2. - METASTATIC CARCINOMA IN 1 OF 4 LYMPH NODES (1/4). 3. Stomach,  resection, additional proximal margin - INVASIVE ADENOCARCINOMA, PRESENT AT THE PROXIMAL MARGIN OF SPECIMEN 3. 4. Stomach, resection, Additional proximal margin - INVASIVE ADENOCARCINOMA, PRESENT AT THE PROXIMAL MARGIN OF SPECIMEN 4. Microscopic Comment  I discussed with the patient that due to + margin and numerous LN he is recommended for adjuvant chemotherapy and RT.  He initially refused RT but after discussion he is willing to meet with RT for evaluation.    Based on results of CALGB study [id number] pts with resected gastric cancer who received postoperative chemoRT with ECF compared to 5FU based regimen did not show improved survival compared to 5FU before and after RT.  A recent study also showed that use of Xeloda with cisplatin followed by RT with Xeloda was better tolerated and had less toxicity than 69f.  Xeloda is comparable to 5FU with RT.  I discussed with him option of 2 cycles of Xeloda dosed at 1000 mg/m2 bid for 14 days with cisplatin 75 mg/m2 on D1 every 3 weeks followed by Xeloda with RT followed by an additional 1 or 2 cycles of XP.  XP had no difference in survival compared to 5 FU/LV.    ChemoRT demonstrated survival advantage over surgery alone in INT-0116 study.  Pt is recommended for chemo/RT.  Pt does not desire multiple days of chemotherapy.  I have discussed with him option of  XP initially followed by Xeloda with RT.    Pt is seen for follow-up after C1 of XP.  Pt still has 60 Xeloda  pills remaining so he did not take Xeloda as prescribed.  He denies any diarrhea.  He reports some mild dryness of hands and feet.    I have asked for navigator to supply pt with calender to detail Xeloda dosing.  He will RTC on 07/10/2018 for labs and will receive C2 of XP on 07/11/2018.  He should notify the office if he has any problems once C2 begins.    All questions answered and he expressed understanding of the information presented.    2.  Abdominal pain.  Pt is requesting refills on  oxycodone.  RX sent to pharmacy.  He also received RX from Dr. Barry Dienes.  I have discussed with him we will continue to monitor closely his narcotic usage.    3.  Dry hands and feet.  This may be side effect of xeloda.  He is recommended for moisturizer such as UDDER cream.   4.  Hypertension.  BP 117/78.  Follow-up with PCP.  5.  Smoking.  Cessation is recommended.  PET scan showed no clear lung abnormalities.  6.  Abdominal aortic aneurysm.  This was noted on recent PET scan.  Patient was also noted to have atherosclerosis on recent PET scan.  He has been seen by vascular surgery and cardiology in the past and was on aspirin and statins.  7.  Compliance with treatment recommendations.  This has been a problem with Mr. Thilges.  He has delayed surgery and had some compliance issues with follow-up. Pt was also noted to not be taking Xeloda as prescribed.    Greater than 25 minutes spent with more than 50% spent in counseling and coordination of care.    Current Status: He is seen today for follow-up.  He reports ongoing abdominal pain.  He is requesting pain medication.      Malignant neoplasm of stomach ([place])   11/11/2017 Initial Diagnosis    Malignant neoplasm of stomach ([place])    12/16/2017 Genetic Testing    Negative genetic testing on the common hereditary cancer panel.  The Hereditary Gene Panel offered by [facility] includes sequencing and/or deletion duplication testing of the following 47 genes: APC, ATM, AXIN2, BARD1, BMPR1A, BRCA1, BRCA2, BRIP1, CDH1, CDK4, CDKN2A (p14ARF), CDKN2A (p16INK4a), CHEK2, CTNNA1, DICER1, EPCAM (Deletion/duplication testing only), GREM1 (promoter region deletion/duplication testing only), KIT, MEN1, MLH1, MSH2, MSH3, MSH6, MUTYH, NBN, NF1, NHTL1, PALB2, PDGFRA, PMS2, POLD1, POLE, PTEN, RAD50, RAD51C, RAD51D, SDHB, SDHC, SDHD, SMAD4, SMARCA4. STK11, TP53, TSC1, TSC2, and VHL.  The following genes were evaluated for sequence changes only: SDHA and HOXB13 c.251G>A  variant only. The report date is December 16, 2017.      Gastric carcinoma ([place])   01/23/2018 Initial Diagnosis    Gastric carcinoma ([place])    01/26/2018 - 03/27/2018 Chemotherapy    The patient had palonosetron (ALOXI) injection 0.25 mg, 0.25 mg, Intravenous,  Once, 4 of 4 cycles Administration: 0.25 mg (01/31/2018), 0.25 mg (02/14/2018), 0.25 mg (02/28/2018), 0.25 mg (03/14/2018) pegfilgrastim (NEULASTA ONPRO KIT) injection 6 mg, 6 mg, Subcutaneous, Once, 4 of 4 cycles Administration: 6 mg (02/01/2018), 6 mg (03/01/2018) leucovorin 400 mg in dextrose 5 % 250 mL infusion, 428 mg, Intravenous,  Once, 4 of 4 cycles Administration: 400 mg (01/31/2018), 400 mg (02/14/2018), 400 mg (02/28/2018), 400 mg (03/14/2018) oxaliplatin (ELOXATIN) 180 mg in dextrose 5 % 500 mL  chemo infusion, 85 mg/m2 = 180 mg, Intravenous,  Once, 4 of 4 cycles Administration: 180 mg (01/31/2018), 180 mg (02/14/2018), 180 mg (02/28/2018), 180 mg (03/14/2018) DOCEtaxel (TAXOTERE) 110 mg in sodium chloride 0.9 % 250 mL chemo infusion, 50 mg/m2 = 110 mg, Intravenous,  Once, 4 of 4 cycles Administration: 110 mg (01/31/2018), 110 mg (02/14/2018), 110 mg (02/28/2018), 110 mg (03/14/2018) fluorouracil (ADRUCIL) 5,550 mg in sodium chloride 0.9 % 139 mL chemo infusion, 2,600 mg/m2 = 5,550 mg, Intravenous, 1 Day/Dose, 4 of 4 cycles Administration: 5,550 mg (01/31/2018), 5,550 mg (02/14/2018), 5,550 mg (02/28/2018), 5,550 mg (03/14/2018)  for chemotherapy treatment.     06/19/2018 -  Chemotherapy    The patient had palonosetron (ALOXI) injection 0.25 mg, 0.25 mg, Intravenous,  Once, 1 of 2 cycles Administration: 0.25 mg (06/20/2018) CISplatin (PLATINOL) 150 mg in sodium chloride 0.9 % 500 mL chemo infusion, 75 mg/m2 = 150 mg (100 % of original dose 75 mg/m2), Intravenous,  Once, 1 of 2 cycles Dose modification: 75 mg/m2 (original dose 75 mg/m2, Cycle 1, Reason: Other (see comments)) Administration: 150 mg (06/20/2018) fosaprepitant (EMEND) 150 mg, dexamethasone (DECADRON)  12 mg in sodium chloride 0.9 % 145 mL IVPB, , Intravenous,  Once, 1 of 2 cycles Administration:  (06/20/2018)  for chemotherapy treatment.       Problem List Patient Active Problem List   Diagnosis Date Noted  . Elevated lipase [R74.8] 03/17/2018  . Hypotension [I95.9] 03/17/2018  . Leukocytosis [D72.829] 03/17/2018  . Gastric carcinoma ([place]) [C16.9] 01/23/2018  . Genetic testing [Z13.79] 12/20/2017  . Family history of cancer [Z80.9]   . IBS (irritable bowel syndrome) [K58.9] 11/28/2017  . Malignant neoplasm of stomach ([place]) [C16.9] 11/11/2017  . Constipation [K59.00] 08/30/2017  . GERD (gastroesophageal reflux disease) [K21.9] 08/30/2017  . Globus sensation [R09.89] 08/30/2017  . Cerebral artery occlusion with cerebral infarction ([place]) [I63.50] 12/20/2016  . PAD (peripheral artery disease) ([place]) [I73.9] 08/09/2014  . CAD (coronary artery disease) [I25.10] 07/22/2014  . Tobacco abuse [Z72.0] 07/22/2014  . 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 Pure hypercholesterolemia [E78.00] 05/28/2014  . Hypertension [I10] 07/19/2012  . Hyperlipidemia [E78.5] 07/19/2012  . PVD (peripheral vascular disease) with claudication ([place]) [I73.9] 06/29/2012  . Atherosclerosis of native arteries of the extremities with intermittent claudication [I70.219] 04/20/2012    Past Medical History Past Medical History:  Diagnosis Date  . CAD (coronary artery disease)    a. Cath 07/2012 following abnormal nuclear stress test: 20-30% LAD, 20-30% OM1, occluded branch off OM2 that fills slowly via collaterals, small nondominant RCA occluded proximally, fills slowly via collaterals.  . CKD (chronic kidney disease), stage II   . Family history of cancer   . Gastric adenocarcinoma ([place])   . History of blood transfusion    "when I was a child"  . Hyperlipidemia   . Hypertension   . Peripheral vascular disease ([place])    a. bilateral superficial femoral artery stents in May/August of 2012.  b. Left femoral to above-knee popliteal bypass in 08/2012.  Marland Kitchen Plantar fasciitis   . Renal insufficiency   . Stroke [facility])    2008 right side weakness  . Tobacco abuse     Past Surgical History Past Surgical History:  Procedure Laterality Date  . ABDOMINAL AORTAGRAM N/A 04/27/2012   Procedure: ABDOMINAL Maxcine Ham;  Surgeon: Conrad Williams, MD;  Location[facility] CATH LAB;  Service: Cardiovascular;  Laterality: N/A;  . ABDOMINAL AORTAGRAM N/A 08/09/2014   Procedure: ABDOMINAL AORTAGRAM;  Surgeon:  Elam Dutch, MD;  Location[facility] CATH LAB;  Service: Cardiovascular;  Laterality: N/A;  . BIOPSY  10/06/2017   Procedure: BIOPSY;  Surgeon: Daneil Dolin, MD;  [facility] ENDO SU[facility];  Service: Endoscopy;;  gastric   . CARDIAC CATHETERIZATION    . COLONOSCOPY    . COLONOSCOPY WITH PROPOFOL N/A 10/06/2017   Procedure: COLONOSCOPY WITH PROPOFOL;  Surgeon: Daneil Dolin, MD;  [facility] END[facility];  Service: Endoscopy;  Laterality: N/A;  1:45pm - patient can't come earlier due to transportation  . ESOPHAGOGASTRODUODENOSCOPY (EGD) WITH PROPOFOL N/A 10/06/2017   Procedure: ESOPHAGOGASTRODUODENOSCOPY (EGD) WITH PROPOFOL;  Surgeon: Daneil Dolin, MD;  [facility] END[facility];  Service: Endoscopy;  Laterality: N/A;  . EUS N/A 11/24/2017   Procedure: UPPER ENDOSCOPIC ULTRASOUND (EUS) RADIAL;  Surgeon: Milus Banister, MD;  Location: [facility] ENDOSCOPY;  Service: Endoscopy;  Laterality: N/A;  . FEMORAL ARTERY - POPLITEAL ARTERY BYPASS GRAFT    . FEMORAL-POPLITEAL BYPASS GRAFT  09/26/2012   Procedure: BYPASS GRAFT FEMORAL-POPLITEAL ARTERY;  Surgeon: Elam Dutch, MD;  Location: [facility] OR;  Service: Vascular;  Laterality: Left;  using 75m Gore-Tex Propaten Ringed Graft, Vein patch on the proximal and distal anastomosis  . FEMORAL-POPLITEAL BYPASS GRAFT Right 08/12/2014   Procedure:  FEMORAL-POPLITEAL ARTERY BYPASS WITH SAPHENOUS VEIN GRAFT, INTRAOPERATIVE ARTERIOGRAM;  Surgeon: CElam Dutch MD;  [place]  Service: Vascular;  Laterality: Right;  . GASTRECTOMY N/A 05/04/2018   Procedure: Total Gastrectomy with Feeding Jejunostomy;  Surgeon: BStark Klein MD;  [place]  Service: General;  Laterality: N/A;  . INTRAOPERATIVE ARTERIOGRAM  08/12/2014   Procedure: INTRA OPERATIVE ARTERIOGRAM;  Surgeon: CElam Dutch MD;  [place]  Service: Vascular;;  . LAPAROSCOPY N/A 05/04/2018   Procedure: LAPAROSCOPY DIAGNOSTIC ERAS PATHWAY;  Surgeon: BStark Klein MD;  [place]  Service: General;  Laterality: N/A;  EPIDURAL  . POLYPECTOMY  10/06/2017   Procedure: POLYPECTOMY;  Surgeon: RDaneil Dolin MD;  [facility];  Service: Endoscopy;;  splenic flexure,  ascending  . PORTACATH PLACEMENT N/A 01/24/2018   Procedure: INSERTION PORT-A-CATH;  Surgeon: BStark Klein MD;  [place]  Service: General;  Laterality: N/A;  . PR VEIN BYPASS GRAFT,AORTO-FEM-POP    . stents in bil legs      Family History Family History  Problem Relation Age of Onset  . Diabetes Mother   . Heart disease Mother        before age 55 Triple BPG  . Cancer Mother        Luekemia  . Deep vein thrombosis Mother   . Hyperlipidemia Mother   . Hypertension Mother   . Heart attack Mother   . Peripheral vascular disease Mother   . Stroke Mother        x 3  . Pancreatic cancer Mother   . Arthritis Father   . Hypertension Father   . Kidney disease Father   . Cancer Maternal Grandmother        NOS  . Diabetes Cousin   . Cancer Cousin        NOS, pat first cousin  . Cancer Cousin        NOS, pat first cousin  . Colon cancer Neg Hx   . Gastric cancer Neg Hx   . Esophageal cancer Neg Hx      Social History  reports that he has been smoking cigars. He started smoking about 43 years ago.  He has a 25.00 pack-year smoking history. He has never used smokeless tobacco. He reports that he has current or past drug history. Drug: Marijuana. He reports that he does not drink  alcohol.  Medications  Current Outpatient Medications:  .  Amino Acids-Protein Hydrolys (PRO-STAT) LIQD, 30 mL., Disp: , Rfl:  .  aspirin EC 81 MG tablet, Take 81 mg by mouth daily., Disp: , Rfl:  .  capecitabine (XELODA) 500 MG tablet, Take 4 tablets (2,000 mg total) by mouth 2 (two) times daily after a meal. Take for 14 days on, then 7 days off., Disp: 112 tablet, Rfl: 0 .  chlorproMAZINE (THORAZINE) 10 MG tablet, Take 1 tablet (10 mg total) by mouth 4 (four) times daily as needed for hiccoughs., Disp: 30 tablet, Rfl: 1 .  CISPLATIN IV, Inject into the vein., Disp: , Rfl:  .  feeding supplement, ENSURE ENLIVE, (ENSURE ENLIVE) LIQD, Take 237 mLs by mouth 2 (two) times daily between meals., Disp: 237 mL, Rfl: 12 .  lidocaine-prilocaine (EMLA) cream, Apply to affected area once, Disp: 30 g, Rfl: 3 .  methocarbamol (ROBAXIN) 500 MG tablet, Take 1 tablet (500 mg total) by mouth every 6 (six) hours as needed for muscle spasms., Disp: 20 tablet, Rfl: 1 .  Nutritional Supplements (FEEDING SUPPLEMENT, OSMOLITE 1.5 CAL,) LIQD, Place 1,000 mLs into feeding tube daily., Disp: 30 Bottle, Rfl: 0 .  nystatin (MYCOSTATIN) 100000 UNIT/ML suspension, TAKE 5 ML BY MOUTH THREE TIMES DAILY, Disp: , Rfl: 0 .  ondansetron (ZOFRAN) 8 MG tablet, Take 1 tablet (8 mg total) by mouth 2 (two) times daily as needed. Start on the third day after chemotherapy., Disp: 30 tablet, Rfl: 1 .  oxyCODONE (OXY IR/ROXICODONE) 5 MG immediate release tablet, Take 1 tablet (5 mg total) by mouth every 6 (six) hours as needed., Disp: 90 tablet, Rfl: 0 .  pantoprazole (PROTONIX) 40 MG tablet, Take 1 tablet (40 mg total) by mouth daily at 12 noon., Disp: 30 tablet, Rfl: 3 .  prochlorperazine (COMPAZINE) 10 MG tablet, Take 1 tablet (10 mg total) by mouth every 6 (six) hours as needed (Nausea or vomiting)., Disp: 30 tablet, Rfl: 1 .  valACYclovir (VALTREX) 500 MG tablet, Take 500 mg by mouth at bedtime., Disp: , Rfl:    Allergies Potassium-containing compounds  Review of Systems Review of Systems - Oncology ROS negative other than abdominal pain   Physical Exam  Vitals Wt Readings from Last 3 Encounters:  07/05/18 173 lb 8 oz (78.7 kg)  07/05/18 178 lb 9.2 oz (81 kg)  07/02/18 178 lb 9.2 oz (81 kg)   Temp Readings from Last 3 Encounters:  07/05/18 98.5 F (36.9 C) (Oral)  07/05/18 97.9 F (36.6 C) (Oral)  07/02/18 98 F (36.7 C) (Oral)   BP Readings from Last 3 Encounters:  07/05/18 117/78  07/05/18 121/87  07/02/18 112/88   Pulse Readings from Last 3 Encounters:  07/05/18 (!) 101  07/05/18 100  07/02/18 100   Constitutional: Well-developed, well-nourished, and in no distress.   HENT: Head: Normocephalic and atraumatic.  Mouth/Throat: No oropharyngeal exudate. Mucosa moist. Eyes: Pupils are equal, round, and reactive to light. Conjunctivae are normal. No scleral icterus.  Neck: Normal range of motion. Neck supple. No JVD present.  Cardiovascular: Normal rate, regular rhythm and normal heart sounds.  Exam reveals no gallop and no friction rub.   No murmur heard. Pulmonary/Chest: Effort normal and breath sounds normal. No respiratory distress. No wheezes.No rales.  Abdominal: Soft. Bowel  sounds are normal. No distension. There is no tenderness. There is no guarding. Feeding tube in place.   Musculoskeletal: No edema or tenderness.  Lymphadenopathy: No cervical, axillary  or supraclavicular adenopathy.  Neurological: Alert and oriented to person, place, and time. No cranial nerve deficit.  Skin: Skin is warm and dry. No rash noted. No erythema. No pallor.  Psychiatric: Affect and judgment normal.   Labs No visits with results within 3 Day(s) from this visit.  Latest known visit with results is:  Appointment on 06/14/2018  Component Date Value Ref Range Status  . WBC 06/14/2018 9.8  4.0 - 10.5 K/uL Final  . RBC 06/14/2018 3.65* 4.22 - 5.81 MIL/uL Final  . Hemoglobin 06/14/2018  9.3* 13.0 - 17.0 g/dL Final  . HCT 06/14/2018 30.6* 39.0 - 52.0 % Final  . MCV 06/14/2018 83.8  78.0 - 100.0 fL Final  . MCH 06/14/2018 25.5* 26.0 - 34.0 pg Final  . MCHC 06/14/2018 30.4  30.0 - 36.0 g/dL Final  . RDW 06/14/2018 16.7* 11.5 - 15.5 % Final  . Platelets 06/14/2018 385  150 - 400 K/uL Final  . Neutrophils Relative % 06/14/2018 63  % Final  . Neutro Abs 06/14/2018 6.2  1.7 - 7.7 K/uL Final  . Lymphocytes Relative 06/14/2018 28  % Final  . Lymphs Abs 06/14/2018 2.8  0.7 - 4.0 K/uL Final  . Monocytes Relative 06/14/2018 8  % Final  . Monocytes Absolute 06/14/2018 0.7  0.1 - 1.0 K/uL Final  . Eosinophils Relative 06/14/2018 1  % Final  . Eosinophils Absolute 06/14/2018 0.1  0.0 - 0.7 K/uL Final  . Basophils Relative 06/14/2018 0  % Final  . Basophils Absolute 06/14/2018 0.0  0.0 - 0.1 K/uL Final   Performed at [facility], [address], [place], [place] [address]  . Sodium 06/14/2018 138  135 - 145 mmol/L Final  . Potassium 06/14/2018 4.1  3.5 - 5.1 mmol/L Final  . Chloride 06/14/2018 104  98 - 111 mmol/L Final  . CO2 06/14/2018 26  22 - 32 mmol/L Final  . Glucose, Bld 06/14/2018 103* 70 - 99 mg/dL Final  . BUN 06/14/2018 17  6 - 20 mg/dL Final  . Creatinine, Ser 06/14/2018 0.94  0.61 - 1.24 mg/dL Final  . Calcium 06/14/2018 9.2  8.9 - 10.3 mg/dL Final  . Total Protein 06/14/2018 7.3  6.5 - 8.1 g/dL Final  . Albumin 06/14/2018 3.3* 3.5 - 5.0 g/dL Final  . AST 06/14/2018 21  15 - 41 U/L Final  . ALT 06/14/2018 27  0 - 44 U/L Final  . Alkaline Phosphatase 06/14/2018 61  38 - 126 U/L Final  . Total Bilirubin 06/14/2018 0.3  0.3 - 1.2 mg/dL Final  . GFR calc non Af Amer 06/14/2018 >60  >60 mL/min Final  . GFR calc Af Amer 06/14/2018 >60  >60 mL/min Final   Comment: (NOTE) The eGFR has been calculated using the CKD EPI equation. This calculation has not been validated in all clinical situations. eGFR's persistently <60 mL/min signify possible Chronic Kidney Disease.   Georgiann Hahn gap 06/14/2018 8  5 - 15 Final   Performed at [facility], [address], [place], [place] [address]  . LDH 06/14/2018 115  98 - 192 U/L Final   Performed at [facility], [address], [place], [place] [address]  . Ferritin 06/14/2018 10* 24 - 336 ng/mL Final   Performed at [facility], [address], [place], [place] [address]  .  CEA 06/14/2018 5.8* 0.0 - 4.7 ng/mL Final   Comment: (NOTE)                             Nonsmokers          <3.9                             Smokers             <5.6 [employer] Electrochemiluminescence Immunoassay (ECLIA) Values obtained with different assay methods or kits cannot be used interchangeably.  Results cannot be interpreted as absolute evidence of the presence or absence of malignant disease. Performed At: [facility] [place], [place] [id number] Rush Farmer MD [id number]      Pathology Orders Placed This Encounter  Procedures  . CBC with Differential/Platelet    Standing Status:   Future    Standing Expiration Date:   07/06/2019  . Comprehensive metabolic panel    Standing Status:   Future    Standing Expiration Date:   07/06/2019  . Lactate dehydrogenase    Standing Status:   Future    Standing Expiration Date:   07/06/2019  . CBC with Differential/Platelet    Standing Status:   Future    Standing Expiration Date:   07/06/2019  . Comprehensive metabolic panel    Standing Status:   Future    Standing Expiration Date:   07/06/2019  . Lactate dehydrogenase    Standing Status:   Future    Standing Expiration Date:   07/06/2019       Zoila Shutter MD

## 2018-07-10 ENCOUNTER — Inpatient Hospital Stay (HOSPITAL_COMMUNITY): Payer: Medicare Other

## 2018-07-10 DIAGNOSIS — R197 Diarrhea, unspecified: Secondary | ICD-10-CM | POA: Diagnosis not present

## 2018-07-10 DIAGNOSIS — R066 Hiccough: Secondary | ICD-10-CM | POA: Diagnosis not present

## 2018-07-10 DIAGNOSIS — I714 Abdominal aortic aneurysm, without rupture: Secondary | ICD-10-CM | POA: Diagnosis not present

## 2018-07-10 DIAGNOSIS — Z7982 Long term (current) use of aspirin: Secondary | ICD-10-CM | POA: Diagnosis not present

## 2018-07-10 DIAGNOSIS — Z8249 Family history of ischemic heart disease and other diseases of the circulatory system: Secondary | ICD-10-CM | POA: Diagnosis not present

## 2018-07-10 DIAGNOSIS — I129 Hypertensive chronic kidney disease with stage 1 through stage 4 chronic kidney disease, or unspecified chronic kidney disease: Secondary | ICD-10-CM | POA: Diagnosis not present

## 2018-07-10 DIAGNOSIS — Z79899 Other long term (current) drug therapy: Secondary | ICD-10-CM | POA: Diagnosis not present

## 2018-07-10 DIAGNOSIS — Z5111 Encounter for antineoplastic chemotherapy: Secondary | ICD-10-CM | POA: Diagnosis not present

## 2018-07-10 DIAGNOSIS — R109 Unspecified abdominal pain: Secondary | ICD-10-CM | POA: Diagnosis not present

## 2018-07-10 DIAGNOSIS — C169 Malignant neoplasm of stomach, unspecified: Secondary | ICD-10-CM

## 2018-07-10 DIAGNOSIS — Z9119 Patient's noncompliance with other medical treatment and regimen: Secondary | ICD-10-CM | POA: Diagnosis not present

## 2018-07-10 DIAGNOSIS — G4700 Insomnia, unspecified: Secondary | ICD-10-CM | POA: Diagnosis not present

## 2018-07-10 LAB — LACTATE DEHYDROGENASE: LDH: 114 U/L (ref 98–192)

## 2018-07-10 LAB — CBC WITH DIFFERENTIAL/PLATELET
Abs Immature Granulocytes: 0.01 10*3/uL (ref 0.00–0.07)
BASOS ABS: 0 10*3/uL (ref 0.0–0.1)
Basophils Relative: 0 %
EOS ABS: 0.1 10*3/uL (ref 0.0–0.5)
EOS PCT: 2 %
HCT: 32.5 % — ABNORMAL LOW (ref 39.0–52.0)
Hemoglobin: 9.6 g/dL — ABNORMAL LOW (ref 13.0–17.0)
Immature Granulocytes: 0 %
LYMPHS PCT: 48 %
Lymphs Abs: 2.1 10*3/uL (ref 0.7–4.0)
MCH: 24.2 pg — ABNORMAL LOW (ref 26.0–34.0)
MCHC: 29.5 g/dL — AB (ref 30.0–36.0)
MCV: 82.1 fL (ref 80.0–100.0)
Monocytes Absolute: 0.7 10*3/uL (ref 0.1–1.0)
Monocytes Relative: 15 %
NRBC: 0 % (ref 0.0–0.2)
Neutro Abs: 1.5 10*3/uL — ABNORMAL LOW (ref 1.7–7.7)
Neutrophils Relative %: 35 %
Platelets: 387 10*3/uL (ref 150–400)
RBC: 3.96 MIL/uL — AB (ref 4.22–5.81)
RDW: 19.5 % — AB (ref 11.5–15.5)
WBC: 4.4 10*3/uL (ref 4.0–10.5)

## 2018-07-10 LAB — COMPREHENSIVE METABOLIC PANEL
ALBUMIN: 3.1 g/dL — AB (ref 3.5–5.0)
ALT: 11 U/L (ref 0–44)
ANION GAP: 8 (ref 5–15)
AST: 16 U/L (ref 15–41)
Alkaline Phosphatase: 57 U/L (ref 38–126)
BUN: 20 mg/dL (ref 6–20)
CHLORIDE: 101 mmol/L (ref 98–111)
CO2: 27 mmol/L (ref 22–32)
Calcium: 9 mg/dL (ref 8.9–10.3)
Creatinine, Ser: 1.01 mg/dL (ref 0.61–1.24)
GFR calc Af Amer: >60 mL/min (ref >60)
GFR calc non Af Amer: >60 mL/min (ref >60)
GLUCOSE: 107 mg/dL — AB (ref 70–99)
POTASSIUM: 4.2 mmol/L (ref 3.5–5.1)
SODIUM: 136 mmol/L (ref 135–145)
Total Bilirubin: 0.3 mg/dL (ref 0.3–1.2)
Total Protein: 6.9 g/dL (ref 6.5–8.1)

## 2018-07-11 ENCOUNTER — Other Ambulatory Visit (HOSPITAL_COMMUNITY): Payer: Self-pay

## 2018-07-11 ENCOUNTER — Inpatient Hospital Stay (HOSPITAL_COMMUNITY): Payer: Medicare Other

## 2018-07-11 ENCOUNTER — Inpatient Hospital Stay (HOSPITAL_COMMUNITY): Payer: Medicare Other | Admitting: Dietician

## 2018-07-11 ENCOUNTER — Ambulatory Visit (HOSPITAL_COMMUNITY): Payer: Self-pay | Admitting: Internal Medicine

## 2018-07-11 VITALS — BP 128/79 | HR 81 | Temp 98.6°F | Resp 18 | Wt 174.2 lb

## 2018-07-11 DIAGNOSIS — I714 Abdominal aortic aneurysm, without rupture: Secondary | ICD-10-CM | POA: Diagnosis not present

## 2018-07-11 DIAGNOSIS — R066 Hiccough: Secondary | ICD-10-CM | POA: Diagnosis not present

## 2018-07-11 DIAGNOSIS — C169 Malignant neoplasm of stomach, unspecified: Secondary | ICD-10-CM

## 2018-07-11 DIAGNOSIS — R109 Unspecified abdominal pain: Secondary | ICD-10-CM | POA: Diagnosis not present

## 2018-07-11 DIAGNOSIS — I129 Hypertensive chronic kidney disease with stage 1 through stage 4 chronic kidney disease, or unspecified chronic kidney disease: Secondary | ICD-10-CM | POA: Diagnosis not present

## 2018-07-11 DIAGNOSIS — Z7982 Long term (current) use of aspirin: Secondary | ICD-10-CM | POA: Diagnosis not present

## 2018-07-11 DIAGNOSIS — G4700 Insomnia, unspecified: Secondary | ICD-10-CM | POA: Diagnosis not present

## 2018-07-11 DIAGNOSIS — R197 Diarrhea, unspecified: Secondary | ICD-10-CM | POA: Diagnosis not present

## 2018-07-11 DIAGNOSIS — Z9119 Patient's noncompliance with other medical treatment and regimen: Secondary | ICD-10-CM | POA: Diagnosis not present

## 2018-07-11 DIAGNOSIS — Z8249 Family history of ischemic heart disease and other diseases of the circulatory system: Secondary | ICD-10-CM | POA: Diagnosis not present

## 2018-07-11 DIAGNOSIS — Z79899 Other long term (current) drug therapy: Secondary | ICD-10-CM | POA: Diagnosis not present

## 2018-07-11 DIAGNOSIS — Z5111 Encounter for antineoplastic chemotherapy: Secondary | ICD-10-CM | POA: Diagnosis not present

## 2018-07-11 MED ORDER — HEPARIN SOD (PORK) LOCK FLUSH 100 UNIT/ML IV SOLN
500.0000 [IU] | Freq: Once | INTRAVENOUS | Status: AC | PRN
  Administered 2018-07-11: 500 [IU]

## 2018-07-11 MED ORDER — SODIUM CHLORIDE 0.9% FLUSH
10.0000 mL | INTRAVENOUS | Status: DC | PRN
  Administered 2018-07-11: 10 mL
  Filled 2018-07-11: qty 10

## 2018-07-11 MED ORDER — SODIUM CHLORIDE 0.9 % IV SOLN
INTRAVENOUS | Status: AC
  Administered 2018-07-11: 09:00:00 via INTRAVENOUS

## 2018-07-11 MED ORDER — PALONOSETRON HCL INJECTION 0.25 MG/5ML
0.2500 mg | Freq: Once | INTRAVENOUS | Status: AC
  Administered 2018-07-11: 0.25 mg via INTRAVENOUS
  Filled 2018-07-11: qty 5

## 2018-07-11 MED ORDER — SODIUM CHLORIDE 0.9 % IV SOLN
Freq: Once | INTRAVENOUS | Status: AC
  Administered 2018-07-11: 13:00:00 via INTRAVENOUS

## 2018-07-11 MED ORDER — SODIUM CHLORIDE 0.9 % IV SOLN
Freq: Once | INTRAVENOUS | Status: AC
  Administered 2018-07-11: 09:00:00 via INTRAVENOUS
  Filled 2018-07-11: qty 5

## 2018-07-11 MED ORDER — SODIUM CHLORIDE 0.9 % IV SOLN
75.0000 mg/m2 | Freq: Once | INTRAVENOUS | Status: AC
  Administered 2018-07-11: 150 mg via INTRAVENOUS
  Filled 2018-07-11: qty 150

## 2018-07-11 NOTE — Progress Notes (Signed)
Nutrition Follow-up  57 y/o male PMHx CAD, CKD2, PVD, CVA, Tobacco abuse, HTN/HLD. Developed acute epigastric pain w/ n/v and globus sensation Dec of 2018. EGD would later show GEJ ulcers, s/p biopsy + for gastric carcinoma. Underwent 4 cycles of FLOT chemo regimen..Pt initially decided to forego recommended surgical intervention. 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 Began adjuvant chemotherapy on 9/24, adjuvant radiation started 10/8   Following up w pt at his second chemotherapy infusion. When last seen, he maintained that he was drinking 5 Ensures each day and also eating various foods. He reported chest tightness at the rate of 85cc/hr and also that he was unable to infuse the promod due to its viscosity. Given reported level of intake and troubles w/ TF regimen, his TF regimen was changed to the following: Osmolite 1.5 @65  x12 hrs w/ 60 ml flush before and after. Provides: 1170 kcals, 49g Pro and 714 ml fluid.   From chart, Note he has been to the ED on 10/6 and 10/9, both times related to PEG malfunction. He began adjuvant radiotherapy this past week. _________  Today, his weight is 174.2 lbs. This is a loss of 3.5 lbs since he was seen by RD 9/24, however, he was 178.5 lbs when weighed 10/6 in the ED, indicating loss has been more acute.   He says the sutures on his PEG keep ripping. He says this is because "I sleep wild". He has been sleeping on a couch because he doesn't have a bed. He says his dad recently bought him an air mattress, and he thinks he will be able to more careful now. However, he says he "busted another suture last night:".   Pt is a very poor historian and it is exceedingly difficult to determine exactly what he has been doing with his tube feeding, but he says he is infusing the Osmolite @65  cc/hr x 12. RD asked if his tightness in chest he reported last time improved. He did not recall ever having this  issue.   Today he says he has restarted using promod. RD asked how much he was using promod. He then replies that he is NOT using promod. This went back and forth for a while. From what RD could discern, he used it for a couple days, then stopped. He wanted to use it because it left a better taste in his mouth. He says the promod is "not as thick now. They must have done something to it"  He says he is taking intake by mouth. He is drinking 2 Ensures a day. He says he likes to drink these while he is on the pump. When last seen, it was 5/day. He also continues to eat table food. He approximates he eats 1x a day. Surprisingly, he says he can tolerate essentially all foods: fries, tuna, miracle whip, tuna, burritos, egg, baked fish.   He denies any n/v/c/d. His only complaint today is that his nipples have been hard/hurting for a week.   He says he will start radiation on the 18th.    Wt Readings from Last 10 Encounters:  07/11/18 174 lb 3.2 oz (79 kg)  07/05/18 173 lb 8 oz (78.7 kg)  07/05/18 178 lb 9.2 oz (81 kg)  07/02/18 178 lb 9.2 oz (81 kg)  06/20/18 177 lb 9.6 oz (80.6 kg)  06/15/18 172 lb (78 kg)  06/14/18 172 lb (78 kg)  05/31/18 172 lb 6.4 oz (78.2 kg)  05/15/18 172 lb 9.9 oz (78.3 kg)  04/20/18 182 lb 1.6 oz (82.6 kg)   MEDICATIONS:  Chemotherapy: Xeloda, cisplatin Other: Thorazine, Ensure Enlive, Nystatin, Zofran, PPI, Oxycodone, Robaxin  LABS: Albumin: 3.1 (9/24:3.1): Glu: 107,  Renal labs stable  Recent Labs  Lab 07/10/18 0824  NA 136  K 4.2  CL 101  CO2 27  BUN 20  CREATININE 1.01  CALCIUM 9.0  GLUCOSE 107*   ANTHROPOMETRICS: Height:  Ht Readings from Last 1 Encounters:  07/05/18 6' 1"  (1.854 m)   Weight:  Wt Readings from Last 1 Encounters:  07/11/18 174 lb 3.2 oz (79 kg)   BMI:  BMI Readings from Last 1 Encounters:  07/11/18 22.98 kg/m   UBW: 200-205 lbs IBW: 83.6 kg Wt change: -3.5 lbs x3 weeks.   ESTIMATED ENERGY NEEDS:  Kcal: 2350-2550  (30-32 kcal/kg bw) Protein:110-127g Pro (1.4-1.6g/kg bw) Fluid: >2.4 L fluid (30 ml/kg bw)  NUTRITION DIAGNOSIS:  Increased protein/kcal needs related to cancer and cancer related treatments as evidenced by the estimated nutritional requirements for this condition  DOCUMENTATION CODES:  Not applicable  INTERVENTION:  Though difficult to understand exactly what he is doing, It does sound as if he has been infusing his tube feeding each night.   He seems to prefer using the promod. He is no longer having difficulty infusing it. RD noted we can restart it if it is leaving a better taste in his mouth. It will likely help since he is about to start Radiation.   RD also noted his current TF regimen was adjusted based off his intake and his Ensure intake. He went from drinking 5 of these to only 2. RD asked him to increase this to 3/day. He noted understanding.   His new regimen is as follows:  Osmolite 1.5 @65  x12 hrs w/ 60 ml flush before and after +30 ml promod q 24 hrs w/ 30 ml flush before and after. Provides: 1270 kcals, 64g Pro and 774 ml fluid. He then will meet remaining needs with his Po intake and 3 Ensures: (1050 kcals, 60g Pro, and 540 ml fluid).   RD hand wrote this regimen on piece of paper to help him remember.    RD provided him his 6th case of Ensure.  GOAL:   Oral intake to meet >90% of estimate needs  wt stability vs gain  patient will demonstrate compliance w/ TF  Not quite met  MONITOR:  Weight, TF tolerance, oral diet tolerance, tolerance to chemotherapy/radiation  ongoing  Next Visit: 11/5 during infusion  [name] RD, LDN, CNSC Clinical Nutrition Available Tues-Sat via Pager: [phone] 07/11/2018 8:39 AM

## 2018-07-11 NOTE — Patient Instructions (Signed)
[facility] Discharge Instructions for Patients Receiving Chemotherapy   Beginning January 23rd 2017 lab work for the Cancer Center will be done in the  Main lab at [place] on 1st floor. If you have a lab appointment with the Cancer Center please come in thru the  Main Entrance and check in at the main information desk   Today you received the following chemotherapy agents Cisplatin.Follow-up as scheduled. Call clinic for any questions or concerns  To help prevent nausea and vomiting after your treatment, we encourage you to take your nausea medication   If you develop nausea and vomiting, or diarrhea that is not controlled by your medication, call the clinic.  The clinic phone number is [phone]. Office hours are Monday-Friday 8:30am-5:00pm.  BELOW ARE SYMPTOMS THAT SHOULD BE REPORTED IMMEDIATELY:  *FEVER GREATER THAN 101.0 F  *CHILLS WITH OR WITHOUT FEVER  NAUSEA AND VOMITING THAT IS NOT CONTROLLED WITH YOUR NAUSEA MEDICATION  *UNUSUAL SHORTNESS OF BREATH  *UNUSUAL BRUISING OR BLEEDING  TENDERNESS IN MOUTH AND THROAT WITH OR WITHOUT PRESENCE OF ULCERS  *URINARY PROBLEMS  *BOWEL PROBLEMS  UNUSUAL RASH Items with * indicate a potential emergency and should be followed up as soon as possible. If you have an emergency after office hours please contact your primary care physician or go to the nearest emergency department.  Please call the clinic during office hours if you have any questions or concerns.   You may also contact the Patient Navigator at [phone] should you have any questions or need assistance in obtaining follow up care.      Resources For Cancer Patients and their Caregivers ? American Cancer Society: Can assist with transportation, wigs, general needs, runs Look Good Feel Better.        1-888-227-6333 ? Cancer Care: Provides financial assistance, online support groups, medication/co-pay assistance.  1-800-813-HOPE  (4673) ? [facility] Assists [place] cancer patients and their families through emotional , educational and financial support.  336-427-4357 ? [place] DSS Where to apply for food stamps, Medicaid and utility assistance. 336-342-1394 ? [facility]: Transportation to medical appointments. 336-347-2287 ? Social Security Administration: May apply for disability if have a Stage IV cancer. 336-342-7796 1-800-772-1213 ? [place] Aging, Disability and Transit Services: Assists with nutrition, care and transit needs. 336-349-2343         

## 2018-07-11 NOTE — Progress Notes (Signed)
0850 labs reviewed with Dr. Walden Field, including ANC of 1.5, and patient approved for Cisplatin tx today. Dr. Walden Field also made aware of patients report of neuropathy in his fingers and feet.   [place] Cellar tolerated Cisplatin infusion without incident or complaint. VSS upon completion of treatment. Discharged self ambulatory in satisfactory condition in presence of friend.

## 2018-07-12 ENCOUNTER — Other Ambulatory Visit (HOSPITAL_COMMUNITY): Payer: Self-pay

## 2018-07-12 ENCOUNTER — Ambulatory Visit (HOSPITAL_COMMUNITY): Payer: Self-pay | Admitting: Internal Medicine

## 2018-07-13 ENCOUNTER — Other Ambulatory Visit: Payer: Self-pay

## 2018-07-13 ENCOUNTER — Encounter (HOSPITAL_COMMUNITY): Payer: Self-pay

## 2018-07-13 ENCOUNTER — Emergency Department (HOSPITAL_COMMUNITY)
Admission: EM | Admit: 2018-07-13 | Discharge: 2018-07-13 | Disposition: A | Discharge status: Home / Self Care | Payer: Medicare Other | Attending: Emergency Medicine | Admitting: Emergency Medicine

## 2018-07-13 DIAGNOSIS — Z79899 Other long term (current) drug therapy: Secondary | ICD-10-CM | POA: Insufficient documentation

## 2018-07-13 DIAGNOSIS — K9420 Gastrostomy complication, unspecified: Secondary | ICD-10-CM | POA: Insufficient documentation

## 2018-07-13 DIAGNOSIS — Y999 Unspecified external cause status: Secondary | ICD-10-CM | POA: Insufficient documentation

## 2018-07-13 DIAGNOSIS — Y33XXXA Other specified events, undetermined intent, initial encounter: Secondary | ICD-10-CM | POA: Insufficient documentation

## 2018-07-13 DIAGNOSIS — N182 Chronic kidney disease, stage 2 (mild): Secondary | ICD-10-CM | POA: Insufficient documentation

## 2018-07-13 DIAGNOSIS — F17290 Nicotine dependence, other tobacco product, uncomplicated: Secondary | ICD-10-CM | POA: Insufficient documentation

## 2018-07-13 DIAGNOSIS — Y939 Activity, unspecified: Secondary | ICD-10-CM | POA: Insufficient documentation

## 2018-07-13 DIAGNOSIS — I2510 Atherosclerotic heart disease of native coronary artery without angina pectoris: Secondary | ICD-10-CM | POA: Diagnosis not present

## 2018-07-13 DIAGNOSIS — Z8673 Personal history of transient ischemic attack (TIA), and cerebral infarction without residual deficits: Secondary | ICD-10-CM | POA: Diagnosis not present

## 2018-07-13 DIAGNOSIS — Z85028 Personal history of other malignant neoplasm of stomach: Secondary | ICD-10-CM | POA: Diagnosis not present

## 2018-07-13 DIAGNOSIS — Y929 Unspecified place or not applicable: Secondary | ICD-10-CM | POA: Insufficient documentation

## 2018-07-13 DIAGNOSIS — Z7982 Long term (current) use of aspirin: Secondary | ICD-10-CM | POA: Insufficient documentation

## 2018-07-13 DIAGNOSIS — S31119A Laceration without foreign body of abdominal wall, unspecified quadrant without penetration into peritoneal cavity, initial encounter: Secondary | ICD-10-CM | POA: Diagnosis not present

## 2018-07-13 DIAGNOSIS — I129 Hypertensive chronic kidney disease with stage 1 through stage 4 chronic kidney disease, or unspecified chronic kidney disease: Secondary | ICD-10-CM | POA: Insufficient documentation

## 2018-07-13 DIAGNOSIS — K9429 Other complications of gastrostomy: Secondary | ICD-10-CM | POA: Diagnosis not present

## 2018-07-13 MED ORDER — LIDOCAINE HCL (PF) 1 % IJ SOLN
INTRAMUSCULAR | Status: AC
  Administered 2018-07-13: 10:00:00
  Filled 2018-07-13: qty 4

## 2018-07-13 NOTE — ED Provider Notes (Addendum)
[facility] EMERGENCY DEPARTMENT Provider Note   CSN: [id number] Arrival date & time: 07/13/18  0830     History   Chief Complaint Chief Complaint  Patient presents with  . Wound Check    HPI Todd [name] is a 57 y.o. male with a recent diagnosis of gastric carcinoma with a gastrostomy tube in place presenting for concern with loosening of 1 of the stitches of the hub causing his tube to "sag".  He has no problems with the use of the G-tube, use it this morning for his nutrition.  He denies abdominal pain, nausea, vomiting.  No fevers.  He is desirous of having tighter stitches placed.  The history is provided by the patient.    Past Medical History:  Diagnosis Date  . CAD (coronary artery disease)    a. Cath 07/2012 following abnormal nuclear stress test: 20-30% LAD, 20-30% OM1, occluded branch off OM2 that fills slowly via collaterals, small nondominant RCA occluded proximally, fills slowly via collaterals.  . CKD (chronic kidney disease), stage II   . Family history of cancer   . Gastric adenocarcinoma ([place])   . History of blood transfusion    "when I was a child"  . Hyperlipidemia   . Hypertension   . Peripheral vascular disease ([place])    a. bilateral superficial femoral artery stents in May/August of 2012. b. Left femoral to above-knee popliteal bypass in 08/2012.  Marland Kitchen Plantar fasciitis   . Renal insufficiency   . Stroke [facility])    2008 right side weakness  . Tobacco abuse     Patient Active Problem List   Diagnosis Date Noted  . Elevated lipase 03/17/2018  . Hypotension 03/17/2018  . Leukocytosis 03/17/2018  . Gastric carcinoma ([place]) 01/23/2018  . Genetic testing 12/20/2017  . Family history of cancer   . IBS (irritable bowel syndrome) 11/28/2017  . Malignant neoplasm of stomach ([place]) 11/11/2017  . Constipation 08/30/2017  . GERD (gastroesophageal reflux disease) 08/30/2017  . Globus sensation 08/30/2017  . Cerebral artery occlusion with cerebral infarction  ([place]) 12/20/2016  . PAD (peripheral artery disease) ([place]) 08/09/2014  . CAD (coronary artery disease) 07/22/2014  . Tobacco abuse 07/22/2014  . 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 Pure hypercholesterolemia 05/28/2014  . Hypertension 07/19/2012  . Hyperlipidemia 07/19/2012  . PVD (peripheral vascular disease) with claudication ([place]) 06/29/2012  . Atherosclerosis of native arteries of the extremities with intermittent claudication 04/20/2012    Past Surgical History:  Procedure Laterality Date  . ABDOMINAL AORTAGRAM N/A 04/27/2012   Procedure: ABDOMINAL Maxcine Ham;  Surgeon: Conrad [place]tica, MD;  [facility] C[facility] LAB;  Service: Cardiovascular;  Laterality: N/A;  . ABDOMINAL AORTAGRAM N/A 08/09/2014   Procedure: ABDOMINAL Maxcine Ham;  Surgeon: Elam Dutch, MD;  Location: [facility] CATH LAB;  Service: Cardiovascular;  Laterality: N/A;  . BIOPSY  10/06/2017   Procedure: BIOPSY;  Surgeon: Daneil Dolin, MD;  [facility] ENDO SU[facility];  Service: Endoscopy;;  gastric   . CARDIAC CATHETERIZATION    . COLONOSCOPY    . COLONOSCOPY WITH PROPOFOL N/A 10/06/2017   Procedure: COLONOSCOPY WITH PROPOFOL;  Surgeon: Daneil Dolin, MD;  Location: [facility] ENDO SUITE;  Service: Endoscopy;  Laterality: N/A;  1:45pm - patient can't come earlier due to transportation  . ESOPHAGOGASTRODUODENOSCOPY (EGD) WITH PROPOFOL N/A 10/06/2017   Procedure: ESOPHAGOGASTRODUODENOSCOPY (EGD) WITH PROPOFOL;  Surgeon: Daneil Dolin, MD;  [facility] ENDO SU[facility];  Service: Endoscopy;  Laterality: N/A;  . EUS N/A 11/24/2017  Procedure: UPPER ENDOSCOPIC ULTRASOUND (EUS) RADIAL;  Surgeon: Milus Banister, MD;  Location: [facility] ENDOSCOPY;  Service: Endoscopy;  Laterality: N/A;  . FEMORAL ARTERY - POPLITEAL ARTERY BYPASS GRAFT    . FEMORAL-POPLITEAL BYPASS GRAFT  09/26/2012   Procedure: BYPASS GRAFT FEMORAL-POPLITEAL ARTERY;  Surgeon: Elam Dutch, MD;  Location: [facility] OR;  Service: Vascular;  Laterality: Left;  using 55mm Gore-Tex  Propaten Ringed Graft, Vein patch on the proximal and distal anastomosis  . FEMORAL-POPLITEAL BYPASS GRAFT Right 08/12/2014   Procedure:  FEMORAL-POPLITEAL ARTERY BYPASS WITH SAPHENOUS VEIN GRAFT, INTRAOPERATIVE ARTERIOGRAM;  Surgeon: Elam Dutch, MD;  Location: [place];  Service: Vascular;  Laterality: Right;  . GASTRECTOMY N/A 05/04/2018   Procedure: Total Gastrectomy with Feeding Jejunostomy;  Surgeon: [name], MD;  Location: [place];  Service: General;  Laterality: N/A;  . INTRAOPERATIVE ARTERIOGRAM  08/12/2014   Procedure: INTRA OPERATIVE ARTERIOGRAM;  Surgeon: Elam Dutch, MD;  Location: [place];  Service: Vascular;;  . LAPAROSCOPY N/A 05/04/2018   Procedure: LAPAROSCOPY DIAGNOSTIC ERAS PATHWAY;  Surgeon: [name], MD;  Location: [place];  Service: General;  Laterality: N/A;  EPIDURAL  . POLYPECTOMY  10/06/2017   Procedure: POLYPECTOMY;  Surgeon: Daneil Dolin, MD;  [facility];  Service: Endoscopy;;  splenic flexure,  ascending  . PORTACATH PLACEMENT N/A 01/24/2018   Procedure: INSERTION PORT-A-CATH;  Surgeon: [name], MD;  Location: [place];  Service: General;  Laterality: N/A;  . PR VEIN BYPASS GRAFT,AORTO-FEM-POP    . stents in bil legs          Home Medications    Prior to Admission medications   Medication Sig Start Date End Date Taking? Authorizing Provider  Amino Acids-Protein Hydrolys (PRO-STAT) LIQD 30 mL. 05/16/18   [provider]  aspirin EC 81 MG tablet Take 81 mg by mouth daily.    [provider]  capecitabine (XELODA) 500 MG tablet Take 4 tablets (2,000 mg total) by mouth 2 (two) times daily after a meal. Take for 14 days on, then 7 days off. 06/19/18   Higgs, Mathis Dad, MD  chlorproMAZINE (THORAZINE) 10 MG tablet Take 1 tablet (10 mg total) by mouth 4 (four) times daily as needed for hiccoughs. 05/16/18   [name], MD  CISPLATIN IV Inject into the vein.    [provider]  feeding supplement, ENSURE ENLIVE, (ENSURE  ENLIVE) LIQD Take 237 mLs by mouth 2 (two) times daily between meals. 05/16/18   [name], MD  lidocaine-prilocaine (EMLA) cream Apply to affected area once 06/19/18   Higgs, Mathis Dad, MD  methocarbamol (ROBAXIN) 500 MG tablet Take 1 tablet (500 mg total) by mouth every 6 (six) hours as needed for muscle spasms. 05/16/18   [name], MD  Nutritional Supplements (FEEDING SUPPLEMENT, OSMOLITE 1.5 CAL,) LIQD Place 1,000 mLs into feeding tube daily. 05/16/18   [name], MD  nystatin (MYCOSTATIN) 100000 UNIT/ML suspension TAKE 5 ML BY MOUTH THREE TIMES DAILY 06/02/18   [provider]  ondansetron (ZOFRAN) 8 MG tablet Take 1 tablet (8 mg total) by mouth 2 (two) times daily as needed. Start on the third day after chemotherapy. 06/19/18   Higgs, Mathis Dad, MD  oxyCODONE (OXY IR/ROXICODONE) 5 MG immediate release tablet Take 1 tablet (5 mg total) by mouth every 6 (six) hours as needed. 07/05/18   Higgs, Mathis Dad, MD  pantoprazole (PROTONIX) 40 MG tablet Take 1 tablet (40 mg total) by mouth daily at 12 noon. 05/16/18   [name],  MD  prochlorperazine (COMPAZINE) 10 MG tablet Take 1 tablet (10 mg total) by mouth every 6 (six) hours as needed (Nausea or vomiting). 06/19/18   Higgs, Mathis Dad, MD  valACYclovir (VALTREX) 500 MG tablet Take 500 mg by mouth at bedtime.    [provider]    Family History Family History  Problem Relation Age of Onset  . Diabetes Mother   . Heart disease Mother        before age 17- Triple BPG  . Cancer Mother        Luekemia  . Deep vein thrombosis Mother   . Hyperlipidemia Mother   . Hypertension Mother   . Heart attack Mother   . Peripheral vascular disease Mother   . Stroke Mother        x 3  . Pancreatic cancer Mother   . Arthritis Father   . Hypertension Father   . Kidney disease Father   . Cancer Maternal Grandmother        NOS  . Diabetes Cousin   . Cancer Cousin        NOS, pat first cousin  . Cancer Cousin        NOS, pat first cousin  .  Colon cancer Neg Hx   . Gastric cancer Neg Hx   . Esophageal cancer Neg Hx     Social History Social History   Tobacco Use  . Smoking status: Current Every Day Smoker    Packs/day: 1.00    Years: 25.00    Pack years: 25.00    Types: Cigars    Start date: 11/16/1974  . Smokeless tobacco: Never Used  . Tobacco comment: Black and Mild cigars  Substance Use Topics  . Alcohol use: No    Alcohol/week: 0.0 standard drinks  . Drug use: Yes    Types: Marijuana    Comment: last use 05/03/18     Allergies   Potassium-containing compounds   Review of Systems Review of Systems  Constitutional: Negative for fever.  HENT: Negative for congestion and sore throat.   Eyes: Negative.   Respiratory: Negative for chest tightness and shortness of breath.   Cardiovascular: Negative for chest pain.  Gastrointestinal: Negative for abdominal pain and nausea.  Genitourinary: Negative.   Musculoskeletal: Negative for arthralgias, joint swelling and neck pain.  Skin: Negative.  Negative for rash and wound.  Neurological: Negative for dizziness, weakness, light-headedness, numbness and headaches.  Psychiatric/Behavioral: Negative.      Physical Exam Updated Vital Signs BP (!) 137/92   Pulse 92   Temp 97.9 F (36.6 C) (Oral)   Resp 16   Ht 6\' 1"  (1.854 m)   Wt 78.5 kg   SpO2 100%   BMI 22.82 kg/m   Physical Exam  Constitutional: He is oriented to person, place, and time. He appears well-developed and well-nourished.  HENT:  Head: Normocephalic.  Cardiovascular: Normal rate.  Pulmonary/Chest: Effort normal.  Abdominal:  Well approximated G-tube which appears intact and functional.  The hob is sutured by 4 stitches, the superior stitch is very loose, appears to have partially pulled through the skin.  There is no erythema, the ostomy appears healthy.  Neurological: He is alert and oriented to person, place, and time. No sensory deficit.     ED Treatments / Results  Labs (all labs  ordered are listed, but only abnormal results are displayed) Labs Reviewed - No data to display  EKG None  Radiology No results found.  Procedures Procedures (including  critical care time)  Gastrostomy tube hub repair Performed by: Shailee Foots Authorized by: Evalee Jefferson Consent: Verbal consent obtained. Risks and benefits: risks, benefits and alternatives were discussed Consent given by: patient Patient identity confirmed: provided demographic data Prepped and Draped in normal sterile fashion Ostomy site explored    Anesthesia: local infiltration  Local anesthetic: lidocaine 1% without epinephrine  Anesthetic total: 2 ml   Amount of cleaning: betadine followed by saline Skin closure: silk 0-0  Number of sutures: 2  Technique: simple loop suture through hub x 2   Patient tolerance: Patient tolerated the procedure well with no immediate complications.   Medications Ordered in ED Medications  lidocaine (PF) (XYLOCAINE) 1 % injection (  Given by Other 07/13/18 0943)     Initial Impression / Assessment and Plan / ED Course  I have reviewed the triage vital signs and the nursing notes.  Pertinent labs & imaging results that were available during my care of the patient were reviewed by me and considered in my medical decision making (see chart for details).     Prn f/u anticipated.  Pt given wide ace wrap to cover the tube at night while sleeping as he states nighttime is when the stitches seem to get pulled and loosen.  Final Clinical Impressions(s) / ED Diagnoses   Final diagnoses:  Problem with gastrostomy tube [facility])    ED Discharge Orders    None       Landis Martins 07/13/18 [place], [place], DO 07/15/18 0926    Evalee Jefferson, PA-C 07/18/18 [place], [place], DO 07/19/18 1030

## 2018-07-13 NOTE — ED Triage Notes (Signed)
Pt reports he broke a suture attatched to his g tube 4 days ago.

## 2018-07-13 NOTE — Discharge Instructions (Addendum)
Return for any problems or concerns.

## 2018-07-13 NOTE — ED Notes (Signed)
Split gauze placed around jejunostomy tube and secured with tape. Remaining tube taped against skin to keep it from pulling on site. Wide ACE bandage applied around tube.

## 2018-07-14 ENCOUNTER — Other Ambulatory Visit: Payer: Self-pay | Admitting: *Deleted

## 2018-07-14 DIAGNOSIS — C163 Malignant neoplasm of pyloric antrum: Secondary | ICD-10-CM | POA: Diagnosis not present

## 2018-07-14 DIAGNOSIS — C169 Malignant neoplasm of stomach, unspecified: Secondary | ICD-10-CM | POA: Diagnosis not present

## 2018-07-14 DIAGNOSIS — Z510 Encounter for antineoplastic radiation therapy: Secondary | ICD-10-CM | POA: Diagnosis not present

## 2018-07-14 NOTE — Patient Outreach (Signed)
[place] [facility]) Care Management  07/14/2018  Todd [name] 1961-01-25 [id number]   Telephone Screen/Case Closure  Referral Date:  07/14/2018 Referral Source:  [facility] ED Census Reason for Referral:  6 or more ED visits in the past 6 months Insurance:  [employer] Attempt:  Outreach attempt #1 to patient for telephone screening.  Patient answered and verified HIPAA.  Reviewed and discussed [facility] services with patient.  Patient stating he is consumed with Chemotherapy and Radiation at this time.  Agrees to allow RN Health Coach to mail out [facility] pamphlet and information. Encouraged patient to review information and to contact [facility] in the future if he feels he would benefit from services.  Declines screening process at this time.  Plan:  RN Health Coach will close case based on patient declining services.   [place] [phone] [email or web address]

## 2018-07-15 DIAGNOSIS — C160 Malignant neoplasm of cardia: Secondary | ICD-10-CM | POA: Diagnosis not present

## 2018-07-16 DIAGNOSIS — C160 Malignant neoplasm of cardia: Secondary | ICD-10-CM | POA: Diagnosis not present

## 2018-07-17 ENCOUNTER — Other Ambulatory Visit (HOSPITAL_COMMUNITY): Payer: Self-pay | Admitting: Internal Medicine

## 2018-07-17 DIAGNOSIS — C169 Malignant neoplasm of stomach, unspecified: Secondary | ICD-10-CM

## 2018-07-20 ENCOUNTER — Other Ambulatory Visit: Payer: Self-pay

## 2018-07-20 DIAGNOSIS — Z510 Encounter for antineoplastic radiation therapy: Secondary | ICD-10-CM | POA: Diagnosis not present

## 2018-07-20 DIAGNOSIS — C169 Malignant neoplasm of stomach, unspecified: Secondary | ICD-10-CM | POA: Diagnosis not present

## 2018-07-20 DIAGNOSIS — C163 Malignant neoplasm of pyloric antrum: Secondary | ICD-10-CM | POA: Diagnosis not present

## 2018-07-20 NOTE — Patient Outreach (Signed)
[place] [facility]) Care Management  07/20/2018  Todd [name] 1961-05-16 [id number]   Medication Adherence call to Todd [name] spoke with patient he is no longer taking Rosuvastatin 10 mg he explain when they detect him with cancer doctor took him off all medication. Mr. Dimperio is showing past due under [place].   [place] Management Direct Dial [phone]  Fax [phone] [email or web address]

## 2018-07-21 DIAGNOSIS — Z510 Encounter for antineoplastic radiation therapy: Secondary | ICD-10-CM | POA: Diagnosis not present

## 2018-07-21 DIAGNOSIS — C163 Malignant neoplasm of pyloric antrum: Secondary | ICD-10-CM | POA: Diagnosis not present

## 2018-07-21 DIAGNOSIS — C169 Malignant neoplasm of stomach, unspecified: Secondary | ICD-10-CM | POA: Diagnosis not present

## 2018-07-24 ENCOUNTER — Other Ambulatory Visit: Payer: Self-pay

## 2018-07-24 ENCOUNTER — Inpatient Hospital Stay (HOSPITAL_BASED_OUTPATIENT_CLINIC_OR_DEPARTMENT_OTHER): Payer: Medicare Other | Admitting: Internal Medicine

## 2018-07-24 ENCOUNTER — Inpatient Hospital Stay (HOSPITAL_COMMUNITY): Payer: Medicare Other

## 2018-07-24 ENCOUNTER — Encounter (HOSPITAL_COMMUNITY): Payer: Self-pay | Admitting: Internal Medicine

## 2018-07-24 VITALS — BP 132/92 | HR 87 | Temp 98.4°F | Resp 16 | Wt 169.1 lb

## 2018-07-24 DIAGNOSIS — R109 Unspecified abdominal pain: Secondary | ICD-10-CM | POA: Diagnosis not present

## 2018-07-24 DIAGNOSIS — R197 Diarrhea, unspecified: Secondary | ICD-10-CM

## 2018-07-24 DIAGNOSIS — Z5111 Encounter for antineoplastic chemotherapy: Secondary | ICD-10-CM | POA: Diagnosis not present

## 2018-07-24 DIAGNOSIS — I129 Hypertensive chronic kidney disease with stage 1 through stage 4 chronic kidney disease, or unspecified chronic kidney disease: Secondary | ICD-10-CM | POA: Diagnosis not present

## 2018-07-24 DIAGNOSIS — Z9119 Patient's noncompliance with other medical treatment and regimen: Secondary | ICD-10-CM | POA: Diagnosis not present

## 2018-07-24 DIAGNOSIS — R066 Hiccough: Secondary | ICD-10-CM | POA: Diagnosis not present

## 2018-07-24 DIAGNOSIS — C169 Malignant neoplasm of stomach, unspecified: Secondary | ICD-10-CM

## 2018-07-24 DIAGNOSIS — Z7982 Long term (current) use of aspirin: Secondary | ICD-10-CM

## 2018-07-24 DIAGNOSIS — I714 Abdominal aortic aneurysm, without rupture: Secondary | ICD-10-CM

## 2018-07-24 DIAGNOSIS — G4700 Insomnia, unspecified: Secondary | ICD-10-CM | POA: Diagnosis not present

## 2018-07-24 DIAGNOSIS — Z79899 Other long term (current) drug therapy: Secondary | ICD-10-CM

## 2018-07-24 DIAGNOSIS — F17290 Nicotine dependence, other tobacco product, uncomplicated: Secondary | ICD-10-CM

## 2018-07-24 DIAGNOSIS — Z8249 Family history of ischemic heart disease and other diseases of the circulatory system: Secondary | ICD-10-CM | POA: Diagnosis not present

## 2018-07-24 LAB — CBC WITH DIFFERENTIAL/PLATELET
Abs Immature Granulocytes: 0.01 10*3/uL (ref 0.00–0.07)
BASOS PCT: 0 %
Basophils Absolute: 0 10*3/uL (ref 0.0–0.1)
EOS ABS: 0 10*3/uL (ref 0.0–0.5)
EOS PCT: 0 %
HCT: 30.9 % — ABNORMAL LOW (ref 39.0–52.0)
Hemoglobin: 9.4 g/dL — ABNORMAL LOW (ref 13.0–17.0)
IMMATURE GRANULOCYTES: 0 %
Lymphocytes Relative: 43 %
Lymphs Abs: 2.1 10*3/uL (ref 0.7–4.0)
MCH: 24.3 pg — AB (ref 26.0–34.0)
MCHC: 30.4 g/dL (ref 30.0–36.0)
MCV: 79.8 fL — AB (ref 80.0–100.0)
MONOS PCT: 4 %
Monocytes Absolute: 0.2 10*3/uL (ref 0.1–1.0)
Neutro Abs: 2.5 10*3/uL (ref 1.7–7.7)
Neutrophils Relative %: 53 %
PLATELETS: 112 10*3/uL — AB (ref 150–400)
RBC: 3.87 MIL/uL — ABNORMAL LOW (ref 4.22–5.81)
RDW: 20.4 % — AB (ref 11.5–15.5)
WBC: 4.8 10*3/uL (ref 4.0–10.5)
nRBC: 0 % (ref 0.0–0.2)

## 2018-07-24 LAB — COMPREHENSIVE METABOLIC PANEL
ALT: 15 U/L (ref 0–44)
AST: 20 U/L (ref 15–41)
Albumin: 3.5 g/dL (ref 3.5–5.0)
Alkaline Phosphatase: 66 U/L (ref 38–126)
Anion gap: 7 (ref 5–15)
BILIRUBIN TOTAL: 0.7 mg/dL (ref 0.3–1.2)
BUN: 21 mg/dL — AB (ref 6–20)
CHLORIDE: 101 mmol/L (ref 98–111)
CO2: 25 mmol/L (ref 22–32)
CREATININE: 1.06 mg/dL (ref 0.61–1.24)
Calcium: 9.1 mg/dL (ref 8.9–10.3)
GFR calc Af Amer: >60 mL/min (ref >60)
Glucose, Bld: 108 mg/dL — ABNORMAL HIGH (ref 70–99)
Potassium: 4.1 mmol/L (ref 3.5–5.1)
Sodium: 133 mmol/L — ABNORMAL LOW (ref 135–145)
Total Protein: 6.7 g/dL (ref 6.5–8.1)

## 2018-07-24 LAB — LACTATE DEHYDROGENASE: LDH: 129 U/L (ref 98–192)

## 2018-07-24 MED ORDER — CAPECITABINE 500 MG PO TABS: ORAL_TABLET | ORAL | 0 refills | Status: DC

## 2018-07-24 MED ORDER — CHLORPROMAZINE HCL 10 MG PO TABS: 10.0000 mg | ORAL_TABLET | Freq: Four times a day (QID) | ORAL | 1 refills | Status: AC | PRN

## 2018-07-24 NOTE — Progress Notes (Signed)
Diagnosis Gastric carcinoma ([place]) - Plan: CBC with Differential/Platelet, Comprehensive metabolic panel, CBC with Differential/Platelet, Basic metabolic panel  Staging Cancer Staging No matching staging information was found for the patient.  Assessment and Plan:  1.  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  57 y.o. male initially seen by Dr. Sherrine Maples and reported he was in his usual state of health until around Christmas 2018 when he went camping in [place].  During that trip patient apparently had acute onset of epigastric pain associated with vomiting.  He reports retching with vomiting.  Sometime during that episode patient apparently attempted to put his fingers in the back of his throat to relieve the sensation of food being lodged thinking that he may have had a fish bone that was stuck in his throat.  This culminated in vomiting with small amount of blood.  He subsequently had relief from vomiting but he continued to have difficulty swallowing and hoarseness of voice.  Imaging of the neck showed posterior pharyngeal edema without evidence of any foreign object or fishbone.  The pain subsequently improved gradually.    He underwent a colonoscopy that showed tubular adenomas.  An EGD that revealed diffusely infiltrated wall of the stomach with superficial ulcerations.  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    PET scan was done 10/24/2017 and showed IMPRESSION: 1. Small focus of hypermetabolism along the lesser curvature of the antrum of the stomach, corresponding to recently diagnosed gastric neoplasm. No associated lymphadenopathy or definite findings to suggest metastatic disease to the neck, chest, abdomen or pelvis. 2. Aortic atherosclerosis, in addition to left main and 3 vessel coronary artery disease. Please note that although the presence of coronary artery calcium documents the  presence of coronary artery disease, the severity of this disease and any potential stenosis cannot be assessed on this non-gated CT examination. Assessment for potential risk factor modification, dietary therapy or pharmacologic therapy may be warranted, if clinically indicated. 3. In addition, there is fusiform aneurysmal dilatation of the infrarenal abdominal aorta which measures up to 3.9 x 3.7 cm. Recommend followup by ultrasound in 2 years. This recommendation follows ACR consensus guidelines: White Paper of the ACR Incidental Findings Committee II on Vascular Findings. J Am Coll Radiol 2013; 10:789-794.  He Is followed by Dr. Barry Dienes of surgery.  He initially indicated he did not desire surgery or chemotherapy.   Pt has diffuse gastric adenocarcinoma that appears to be localized to the stomach.  Histologically he appears to have a signet ring variety with diffuse involvement.  He was treated with neoadjuvant chemotherapy followed by surgery followed by adjuvant chemotherapy.  Previously, I have discussed with him that even if he were to be treated with surgery alone based on the diffuse nature of his cancer he was still be recommended for adjuvant therapy.    He was treated with FLOT regimen (5-FU dose at 2600 mg/m with 24 hours, oxaliplatin 85 mg/m, leucovorin 200 mg/m, and Taxotere 50 mg/m all on day 1 every 2 weeks for 4 cycles before surgery and 4 cycles following surgery.   PET scan done 10/24/2017 showed no evidence of distant disease.    EUS that was done 11/24/2017 showed:  Normal esophagus, ulcerated mass involving the lesser curvature of the stomach approximately mid body.  Mass measured 5 cm across 5 cm in length.  Mass appeared to be 7- 8 cm from the pylorus.  It was also  5 to 6 cm from the GE junction.  It appeared to pass into and through the muscularis propria as well as the serosa making it a T4 a lesion.  Mass was 1.6 cm in maximal thickness.  No adenopathy was noted.   He was staged as a T4 a N0 by EUS.    He has undergone genetic testing that was negative.     He has completed 4 cycles of FLOT.  Pt failed to keep his follow-up to go over PET scan and was referred to Dr. Barry Dienes for surgical evaluation.  He was planned for surgery on 04/26/2018.    Pt cancelled surgery and wanted  to wait until next year for operation.  He was also reporting port problems.    Pet scan done 03/20/2018 showed IMPRESSION: 1. No evidence of residual gastric carcinoma. 2. No evidence of carcinoma metastasis in the chest, abdomen, or pelvis. 3. Coronary artery calcification and Aortic Atherosclerosis (ICD10-I70.0).  Previously I discussed with him scan findings are due to chemotherapy and if he remains off treatment for a significant amount of time he has a significant risk for morbidity and mortality if he continues to be noncompliant with recommendations.  I have discussed with him he has a significant risk for death from advanced gastric cancer if he continues to defer surgery and adjuvant chemotherapy especially until next year which is what he was requesting.    After discussion he continued to defer surgery until after 04/29/2018.   Pt underwent total gastrectomy on 05/04/2018 Pathology returned as  1. Stomach, resection for tumor - INVASIVE ADENOCARCINOMA, POORLY DIFFERENTIATED, SPANNING AT LEAST 3.2 CM. - ADENOCARCINOMA IS PRESENT AT THE PROXIMAL MARGIN OF SPECIMEN 1. - LYMPHOVASCULAR INVASION IS IDENTIFIED. - PERINEURAL INVASION IS IDENTIFIED. - ADENOCARICNOMA EXTENDS THROUGH THE MUSCULARIS PROPRIA AND INTO SOFT TISSUE WITH PERITONEAL INVOLVEMENT. - METASTATIC CARCINOMA IN 6 OF 6 LYMPH NODES (6/6). - SEE ONCOLOGY TABLE BELOW. 2. Stomach, resection, Additional proximal margin - INVASIVE ADENOCARCINOMA, PRESENT AT THE PROXIMAL MARGIN OF SPECIMEN 2. - METASTATIC CARCINOMA IN 1 OF 4 LYMPH NODES (1/4). 3. Stomach, resection, additional proximal margin - INVASIVE  ADENOCARCINOMA, PRESENT AT THE PROXIMAL MARGIN OF SPECIMEN 3. 4. Stomach, resection, Additional proximal margin - INVASIVE ADENOCARCINOMA, PRESENT AT THE PROXIMAL MARGIN OF SPECIMEN 4. Microscopic Comment  I discussed with the patient that due to + margin and numerous LN he is recommended for adjuvant chemotherapy and RT.  He initially refused RT but after discussion he is willing to meet with RT for evaluation.    Based on results of CALGB study [id number] pts with resected gastric cancer who received postoperative chemoRT with ECF compared to 5FU based regimen did not show improved survival compared to 5FU before and after RT.  A recent study also showed that use of Xeloda with cisplatin followed by RT with Xeloda was better tolerated and had less toxicity than 75f.  Xeloda is comparable to 5FU with RT.  I discussed with him option of 2 cycles of Xeloda dosed at 1000 mg/m2 bid for 14 days with cisplatin 75 mg/m2 on D1 every 3 weeks followed by Xeloda with RT followed by an additional 1 or 2 cycles of XP.  XP had no difference in survival compared to 5 FU/LV.    ChemoRT demonstrated survival advantage over surgery alone in INT-[id number] study.  Pt is recommended for chemo/RT.  Pt does not desire multiple days of chemotherapy.  I have discussed with him option of XP initially followed  by Xeloda with RT.    Pt is seen for follow-up after C2  of XP.  He reports he completed Xeloda dose.  Labs done 07/24/2018 reviewed and showed WBC 4.8 HB 9.4 plts 112,000.  Chemistries WNL with K+ 4.1 Cr 1.06 and normal LFTs.    He is scheduled to begin RT on 08/01/2018.  I have discussed with him Xeloda dosing will change with RT.  He is dosed at 825 mg bid Monday through Friday during RT.  Total dose 1500 mg po bid M-F during RT.  Rx sent to [facility].    He will have labs done weekly while on RT.  He will be seen for follow-up in 3 weeks with labs.  All questions answered and he expressed understanding of the  information presented.    2.  Hiccups.  Rx for Thorazine sent to pharmacy.    3.  Insomnia.  Pt recommended for OTC sleep aids.    4.  Hypertension.  BP 132/92.  Follow-up with PCP.  5.  Smoking.  Cessation is recommended.  PET scan showed no clear lung abnormalities.  6.  Abdominal aortic aneurysm.  This was noted on recent PET scan.  Patient was also noted to have atherosclerosis on recent PET scan.  He has been seen by vascular surgery and cardiology in the past and was on aspirin and statins.  7.  Compliance with treatment recommendations.  This has been a problem with Mr. Ditullio.  He has delayed surgery and had some compliance issues with follow-up. Pt was also noted to not be taking Xeloda as prescribed.    8.  Diarrhea.  Imodium prn.    25 minutes spent with more than 50% spent in counseling and coordination of care.    Current Status: He is seen today for follow-up.  He reports hiccups and problems sleeping.  He reports he finished Xeloda.      Malignant neoplasm of stomach ([place])   11/11/2017 Initial Diagnosis    Malignant neoplasm of stomach ([place])    12/16/2017 Genetic Testing    Negative genetic testing on the common hereditary cancer panel.  The Hereditary Gene Panel offered by [facility] includes sequencing and/or deletion duplication testing of the following 47 genes: APC, ATM, AXIN2, BARD1, BMPR1A, BRCA1, BRCA2, BRIP1, CDH1, CDK4, CDKN2A (p14ARF), CDKN2A (p16INK4a), CHEK2, CTNNA1, DICER1, EPCAM (Deletion/duplication testing only), GREM1 (promoter region deletion/duplication testing only), KIT, MEN1, MLH1, MSH2, MSH3, MSH6, MUTYH, NBN, NF1, NHTL1, PALB2, PDGFRA, PMS2, POLD1, POLE, PTEN, RAD50, RAD51C, RAD51D, SDHB, SDHC, SDHD, SMAD4, SMARCA4. STK11, TP53, TSC1, TSC2, and VHL.  The following genes were evaluated for sequence changes only: SDHA and HOXB13 c.251G>A variant only. The report date is December 16, 2017.      Gastric carcinoma ([place])   01/23/2018 Initial Diagnosis    Gastric  carcinoma ([place])    01/26/2018 - 03/27/2018 Chemotherapy    The patient had palonosetron (ALOXI) injection 0.25 mg, 0.25 mg, Intravenous,  Once, 4 of 4 cycles Administration: 0.25 mg (01/31/2018), 0.25 mg (02/14/2018), 0.25 mg (02/28/2018), 0.25 mg (03/14/2018) pegfilgrastim (NEULASTA ONPRO KIT) injection 6 mg, 6 mg, Subcutaneous, Once, 4 of 4 cycles Administration: 6 mg (02/01/2018), 6 mg (03/01/2018) leucovorin 400 mg in dextrose 5 % 250 mL infusion, 428 mg, Intravenous,  Once, 4 of 4 cycles Administration: 400 mg (01/31/2018), 400 mg (02/14/2018), 400 mg (02/28/2018), 400 mg (03/14/2018) oxaliplatin (ELOXATIN) 180 mg in dextrose 5 % 500 mL chemo infusion, 85 mg/m2 = 180 mg, Intravenous,  Once, 4 of 4 cycles Administration: 180 mg (01/31/2018), 180 mg (02/14/2018), 180 mg (02/28/2018), 180 mg (03/14/2018) DOCEtaxel (TAXOTERE) 110 mg in sodium chloride 0.9 % 250 mL chemo infusion, 50 mg/m2 = 110 mg, Intravenous,  Once, 4 of 4 cycles Administration: 110 mg (01/31/2018), 110 mg (02/14/2018), 110 mg (02/28/2018), 110 mg (03/14/2018) fluorouracil (ADRUCIL) 5,550 mg in sodium chloride 0.9 % 139 mL chemo infusion, 2,600 mg/m2 = 5,550 mg, Intravenous, 1 Day/Dose, 4 of 4 cycles Administration: 5,550 mg (01/31/2018), 5,550 mg (02/14/2018), 5,550 mg (02/28/2018), 5,550 mg (03/14/2018)  for chemotherapy treatment.     06/19/2018 -  Chemotherapy    The patient had palonosetron (ALOXI) injection 0.25 mg, 0.25 mg, Intravenous,  Once, 2 of 2 cycles Administration: 0.25 mg (06/20/2018), 0.25 mg (07/11/2018) CISplatin (PLATINOL) 150 mg in sodium chloride 0.9 % 500 mL chemo infusion, 75 mg/m2 = 150 mg (100 % of original dose 75 mg/m2), Intravenous,  Once, 2 of 2 cycles Dose modification: 75 mg/m2 (original dose 75 mg/m2, Cycle 1, Reason: Other (see comments)) Administration: 150 mg (06/20/2018), 150 mg (07/11/2018) fosaprepitant (EMEND) 150 mg, dexamethasone (DECADRON) 12 mg in sodium chloride 0.9 % 145 mL IVPB, , Intravenous,  Once, 2 of 2  cycles Administration:  (06/20/2018),  (07/11/2018)  for chemotherapy treatment.       Problem List Patient Active Problem List   Diagnosis Date Noted  . Elevated lipase [R74.8] 03/17/2018  . Hypotension [I95.9] 03/17/2018  . Leukocytosis [D72.829] 03/17/2018  . Gastric carcinoma ([place]) [C16.9] 01/23/2018  . Genetic testing [Z13.79] 12/20/2017  . Family history of cancer [Z80.9]   . IBS (irritable bowel syndrome) [K58.9] 11/28/2017  . Malignant neoplasm of stomach ([place]) [C16.9] 11/11/2017  . Constipation [K59.00] 08/30/2017  . GERD (gastroesophageal reflux disease) [K21.9] 08/30/2017  . Globus sensation [R09.89] 08/30/2017  . Cerebral artery occlusion with cerebral infarction ([place]) [I63.50] 12/20/2016  . PAD (peripheral artery disease) ([place]) [I73.9] 08/09/2014  . CAD (coronary artery disease) [I25.10] 07/22/2014  . Tobacco abuse [Z72.0] 07/22/2014  . 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 Pure hypercholesterolemia [E78.00] 05/28/2014  . Hypertension [I10] 07/19/2012  . Hyperlipidemia [E78.5] 07/19/2012  . PVD (peripheral vascular disease) with claudication ([place]) [I73.9] 06/29/2012  . Atherosclerosis of native arteries of the extremities with intermittent claudication [I70.219] 04/20/2012    Past Medical History Past Medical History:  Diagnosis Date  . CAD (coronary artery disease)    a. Cath 07/2012 following abnormal nuclear stress test: 20-30% LAD, 20-30% OM1, occluded branch off OM2 that fills slowly via collaterals, small nondominant RCA occluded proximally, fills slowly via collaterals.  . CKD (chronic kidney disease), stage II   . Family history of cancer   . Gastric adenocarcinoma ([place])   . History of blood transfusion    "when I was a child"  . Hyperlipidemia   . Hypertension   . Peripheral vascular disease ([place])    a. bilateral superficial femoral artery stents in May/August of 2012. b. Left femoral to above-knee popliteal bypass in 08/2012.   Marland Kitchen Plantar fasciitis   . Renal insufficiency   . Stroke [facility])    2008 right side weakness  . Tobacco abuse     Past Surgical History Past Surgical History:  Procedure Laterality Date  . ABDOMINAL AORTAGRAM N/A 04/27/2012   Procedure: ABDOMINAL Maxcine Ham;  Surgeon: Conrad Los Chaves, MD;  Location: [facility] CATH LAB;  Service: Cardiovascular;  Laterality: N/A;  . ABDOMINAL AORTAGRAM N/A 08/09/2014   Procedure: ABDOMINAL Maxcine Ham;  Surgeon: Juanda Crumble  Antony Blackbird, MD;  Location: [place] CATH LAB;  Service: Cardiovascular;  Laterality: N/A;  . BIOPSY  10/06/2017   Procedure: BIOPSY;  Surgeon: Daneil Dolin, MD;  Location[facility] ENDO SU[facility];  Service: Endoscopy;;  gastric   . CARDIAC CATHETERIZATION    . COLONOSCOPY    . COLONOSCOPY WITH PROPOFOL N/A 10/06/2017   Procedure: COLONOSCOPY WITH PROPOFOL;  Surgeon: Daneil Dolin, MD;  Location: [facility] ENDO SUITE;  Service: Endoscopy;  Laterality: N/A;  1:45pm - patient can't come earlier due to transportation  . ESOPHAGOGASTRODUODENOSCOPY (EGD) WITH PROPOFOL N/A 10/06/2017   Procedure: ESOPHAGOGASTRODUODENOSCOPY (EGD) WITH PROPOFOL;  Surgeon: Daneil Dolin, MD;  Location[facility] ENDO SUITE;  Service: Endoscopy;  Laterality: N/A;  . EUS N/A 11/24/2017   Procedure: UPPER ENDOSCOPIC ULTRASOUND (EUS) RADIAL;  Surgeon: Milus Banister, MD;  Location: [facility] ENDOSCOPY;  Service: Endoscopy;  Laterality: N/A;  . FEMORAL ARTERY - POPLITEAL ARTERY BYPASS GRAFT    . FEMORAL-POPLITEAL BYPASS GRAFT  09/26/2012   Procedure: BYPASS GRAFT FEMORAL-POPLITEAL ARTERY;  Surgeon: Elam Dutch, MD;  Location[facility] OR;  Service: Vascular;  Laterality: Left;  using 63m Gore-Tex Propaten Ringed Graft, Vein patch on the proximal and distal anastomosis  . FEMORAL-POPLITEAL BYPASS GRAFT Right 08/12/2014   Procedure:  FEMORAL-POPLITEAL ARTERY BYPASS WITH SAPHENOUS VEIN GRAFT, INTRAOPERATIVE ARTERIOGRAM;  Surgeon: CElam Dutch MD;  [place]  Service: Vascular;  Laterality: Right;  . GASTRECTOMY  N/A 05/04/2018   Procedure: Total Gastrectomy with Feeding Jejunostomy;  Surgeon: BStark Klein MD;  [place] [facility]  Service: General;  Laterality: N/A;  . INTRAOPERATIVE ARTERIOGRAM  08/12/2014   Procedure: INTRA OPERATIVE ARTERIOGRAM;  Surgeon: CElam Dutch MD;  [place]  Service: Vascular;;  . LAPAROSCOPY N/A 05/04/2018   Procedure: LAPAROSCOPY DIAGNOSTIC ERAS PATHWAY;  Surgeon: BStark Klein MD;  [place]  Service: General;  Laterality: N/A;  EPIDURAL  . POLYPECTOMY  10/06/2017   Procedure: POLYPECTOMY;  Surgeon: RDaneil Dolin MD;  [facility];  Service: Endoscopy;;  splenic flexure,  ascending  . PORTACATH PLACEMENT N/A 01/24/2018   Procedure: INSERTION PORT-A-CATH;  Surgeon: BStark Klein MD;  [place] [facility]  Service: General;  Laterality: N/A;  . PR VEIN BYPASS GRAFT,AORTO-FEM-POP    . stents in bil legs      Family History Family History  Problem Relation Age of Onset  . Diabetes Mother   . Heart disease Mother        before age 293 Triple BPG  . Cancer Mother        Luekemia  . Deep vein thrombosis Mother   . Hyperlipidemia Mother   . Hypertension Mother   . Heart attack Mother   . Peripheral vascular disease Mother   . Stroke Mother        x 3  . Pancreatic cancer Mother   . Arthritis Father   . Hypertension Father   . Kidney disease Father   . Cancer Maternal Grandmother        NOS  . Diabetes Cousin   . Cancer Cousin        NOS, pat first cousin  . Cancer Cousin        NOS, pat first cousin  . Colon cancer Neg Hx   . Gastric cancer Neg Hx   . Esophageal cancer Neg Hx      Social History  reports that he has been smoking cigars. He started smoking about 43 years ago. He  has a 25.00 pack-year smoking history. He has never used smokeless tobacco. He reports that he has current or past drug history. Drug: Marijuana. He reports that he does not drink alcohol.  Medications  Current Outpatient Medications:  .  aspirin EC 81  MG tablet, Take 81 mg by mouth daily., Disp: , Rfl:  .  feeding supplement, ENSURE ENLIVE, (ENSURE ENLIVE) LIQD, Take 237 mLs by mouth 2 (two) times daily between meals., Disp: 237 mL, Rfl: 12 .  lidocaine-prilocaine (EMLA) cream, Apply to affected area once, Disp: 30 g, Rfl: 3 .  methocarbamol (ROBAXIN) 500 MG tablet, Take 1 tablet (500 mg total) by mouth every 6 (six) hours as needed for muscle spasms., Disp: 20 tablet, Rfl: 1 .  Nutritional Supplements (FEEDING SUPPLEMENT, OSMOLITE 1.5 CAL,) LIQD, Place 1,000 mLs into feeding tube daily., Disp: 30 Bottle, Rfl: 0 .  oxyCODONE (OXY IR/ROXICODONE) 5 MG immediate release tablet, Take 1 tablet (5 mg total) by mouth every 6 (six) hours as needed., Disp: 90 tablet, Rfl: 0 .  valACYclovir (VALTREX) 500 MG tablet, Take 500 mg by mouth at bedtime., Disp: , Rfl:  .  capecitabine (XELODA) 500 MG tablet, 1500 mg po bid Monday through Friday with radiation, Disp: 120 tablet, Rfl: 0 .  chlorproMAZINE (THORAZINE) 10 MG tablet, Take 1 tablet (10 mg total) by mouth 4 (four) times daily as needed for hiccoughs., Disp: 30 tablet, Rfl: 1 .  CISPLATIN IV, Inject into the vein., Disp: , Rfl:  .  ondansetron (ZOFRAN) 8 MG tablet, Take 1 tablet (8 mg total) by mouth 2 (two) times daily as needed. Start on the third day after chemotherapy. (Patient not taking: Reported on 07/24/2018), Disp: 30 tablet, Rfl: 1 .  prochlorperazine (COMPAZINE) 10 MG tablet, Take 1 tablet (10 mg total) by mouth every 6 (six) hours as needed (Nausea or vomiting). (Patient not taking: Reported on 07/24/2018), Disp: 30 tablet, Rfl: 1  Allergies Potassium-containing compounds  Review of Systems Review of Systems - Oncology ROS negative other than hiccups, insomnia, diarrhea.   Physical Exam  Vitals Wt Readings from Last 3 Encounters:  07/24/18 169 lb 1.6 oz (76.7 kg)  07/13/18 173 lb (78.5 kg)  07/11/18 174 lb 3.2 oz (79 kg)   Temp Readings from Last 3 Encounters:  07/24/18 98.4 F  (36.9 C) (Oral)  07/13/18 97.9 F (36.6 C) (Oral)  07/11/18 98.6 F (37 C) (Oral)   BP Readings from Last 3 Encounters:  07/24/18 (!) 132/92  07/13/18 (!) 137/92  07/11/18 128/79   Pulse Readings from Last 3 Encounters:  07/24/18 87  07/13/18 92  07/11/18 81   Constitutional: Well-developed, well-nourished, and in no distress.   HENT: Head: Normocephalic and atraumatic.  Mouth/Throat: No oropharyngeal exudate. Mucosa moist. Eyes: Pupils are equal, round, and reactive to light. Conjunctivae are normal. No scleral icterus.  Neck: Normal range of motion. Neck supple. No JVD present.  Cardiovascular: Normal rate, regular rhythm and normal heart sounds.  Exam reveals no gallop and no friction rub.   No murmur heard. Pulmonary/Chest: Effort normal and breath sounds normal. No respiratory distress. No wheezes.No rales.  Abdominal: Soft. Bowel sounds are normal. No distension. Feeding tube in place.  No guarding.   Musculoskeletal: No edema or tenderness.  Lymphadenopathy: No cervical, axillary or supraclavicular adenopathy.  Neurological: Alert and oriented to person, place, and time. No cranial nerve deficit.  Skin: Skin is warm and dry. No rash noted. No erythema. No pallor.  Psychiatric: Affect and judgment  normal.   Labs Appointment on 07/24/2018  Component Date Value Ref Range Status  . WBC 07/24/2018 4.8  4.0 - 10.5 K/uL Final  . RBC 07/24/2018 3.87* 4.22 - 5.81 MIL/uL Final  . Hemoglobin 07/24/2018 9.4* 13.0 - 17.0 g/dL Final  . HCT 07/24/2018 30.9* 39.0 - 52.0 % Final  . MCV 07/24/2018 79.8* 80.0 - 100.0 fL Final  . MCH 07/24/2018 24.3* 26.0 - 34.0 pg Final  . MCHC 07/24/2018 30.4  30.0 - 36.0 g/dL Final  . RDW 07/24/2018 20.4* 11.5 - 15.5 % Final  . Platelets 07/24/2018 112* 150 - 400 K/uL Final   Comment: PLATELET COUNT CONFIRMED BY SMEAR SPECIMEN CHECKED FOR CLOTS Immature Platelet Fraction may be clinically indicated, consider ordering this additional  test [id number] GIANT PLATELETS   . nRBC 07/24/2018 0.0  0.0 - 0.2 % Final  . Neutrophils Relative % 07/24/2018 53  % Final  . Neutro Abs 07/24/2018 2.5  1.7 - 7.7 K/uL Final  . Lymphocytes Relative 07/24/2018 43  % Final  . Lymphs Abs 07/24/2018 2.1  0.7 - 4.0 K/uL Final  . Monocytes Relative 07/24/2018 4  % Final  . Monocytes Absolute 07/24/2018 0.2  0.1 - 1.0 K/uL Final  . Eosinophils Relative 07/24/2018 0  % Final  . Eosinophils Absolute 07/24/2018 0.0  0.0 - 0.5 K/uL Final  . Basophils Relative 07/24/2018 0  % Final  . Basophils Absolute 07/24/2018 0.0  0.0 - 0.1 K/uL Final  . Immature Granulocytes 07/24/2018 0  % Final  . Abs Immature Granulocytes 07/24/2018 0.01  0.00 - 0.07 K/uL Final   Performed at [facility], [address] [place], [place] [address]  . Sodium 07/24/2018 133* 135 - 145 mmol/L Final  . Potassium 07/24/2018 4.1  3.5 - 5.1 mmol/L Final  . Chloride 07/24/2018 101  98 - 111 mmol/L Final  . CO2 07/24/2018 25  22 - 32 mmol/L Final  . Glucose, Bld 07/24/2018 108* 70 - 99 mg/dL Final  . BUN 07/24/2018 21* 6 - 20 mg/dL Final  . Creatinine, Ser 07/24/2018 1.06  0.61 - 1.24 mg/dL Final  . Calcium 07/24/2018 9.1  8.9 - 10.3 mg/dL Final  . Total Protein 07/24/2018 6.7  6.5 - 8.1 g/dL Final  . Albumin 07/24/2018 3.5  3.5 - 5.0 g/dL Final  . AST 07/24/2018 20  15 - 41 U/L Final  . ALT 07/24/2018 15  0 - 44 U/L Final  . Alkaline Phosphatase 07/24/2018 66  38 - 126 U/L Final  . Total Bilirubin 07/24/2018 0.7  0.3 - 1.2 mg/dL Final  . GFR calc non Af Amer 07/24/2018 >60  >60 mL/min Final  . GFR calc Af Amer 07/24/2018 >60  >60 mL/min Final   Comment: (NOTE) The eGFR has been calculated using the CKD EPI equation. This calculation has not been validated in all clinical situations. eGFR's persistently <60 mL/min signify possible Chronic Kidney Disease.   Georgiann Hahn gap 07/24/2018 7  5 - 15 Final   Performed at [facility], [address] [place], [place] [address]  . LDH  07/24/2018 129  98 - 192 U/L Final   Performed at [facility], [address] [place], [place] [address]     Pathology Orders Placed This Encounter  Procedures  . CBC with Differential/Platelet    Standing Status:   Future    Standing Expiration Date:   07/25/2019  . Comprehensive metabolic panel    Standing Status:   Future  Standing Expiration Date:   07/25/2019  . CBC with Differential/Platelet    Standing Status:   Standing    Number of Occurrences:   3    Standing Expiration Date:   08/14/2018  . Basic metabolic panel    Standing Status:   Standing    Number of Occurrences:   3    Standing Expiration Date:   08/14/2018       Zoila Shutter MD

## 2018-07-25 ENCOUNTER — Telehealth (HOSPITAL_COMMUNITY): Payer: Self-pay | Admitting: Pharmacist

## 2018-07-25 NOTE — Telephone Encounter (Signed)
Oral Oncology Pharmacist Encounter  Received new prescription for Xeloda (capecitabine) for the treatment of diffuse gastric adenocarcinoma in conjunction with RT, planned duration 30 fractions. He is scheduled to begin RT on 08/01/2018 with [facility].  CMP from 07/25/18 assessed, no relevant lab abnormalities. Prescription dose and frequency assessed.   Current medication list in Epic reviewed, no DDIs with Xeloda identified.  Prescription has been e-scribed to the [facility].  Oral Oncology Clinic will continue to follow for insurance authorization, copayment issues, initial counseling and start date.  Darl Pikes, PharmD, BCPS, [facility] Hematology/Oncology Clinical Pharmacist [facility] [place] [facility] [phone]  07/25/2018 3:01 PM

## 2018-07-26 ENCOUNTER — Other Ambulatory Visit (HOSPITAL_COMMUNITY): Payer: Self-pay

## 2018-07-27 ENCOUNTER — Ambulatory Visit (HOSPITAL_COMMUNITY): Payer: Self-pay

## 2018-07-27 ENCOUNTER — Ambulatory Visit (HOSPITAL_COMMUNITY): Payer: Self-pay | Admitting: Internal Medicine

## 2018-07-27 MED FILL — CAPECITABINE 500 MG TABS: 500 | 28 days supply | Qty: 120 | Fill #0

## 2018-07-31 ENCOUNTER — Other Ambulatory Visit (HOSPITAL_COMMUNITY): Payer: Self-pay

## 2018-08-01 ENCOUNTER — Ambulatory Visit (HOSPITAL_COMMUNITY): Payer: Self-pay | Admitting: Internal Medicine

## 2018-08-01 ENCOUNTER — Ambulatory Visit (HOSPITAL_COMMUNITY): Payer: Self-pay

## 2018-08-01 ENCOUNTER — Inpatient Hospital Stay (HOSPITAL_COMMUNITY): Payer: Medicare Other

## 2018-08-01 ENCOUNTER — Inpatient Hospital Stay (HOSPITAL_COMMUNITY): Payer: Medicare Other | Attending: Internal Medicine | Admitting: Dietician

## 2018-08-01 DIAGNOSIS — Z510 Encounter for antineoplastic radiation therapy: Secondary | ICD-10-CM | POA: Diagnosis not present

## 2018-08-01 DIAGNOSIS — Z7982 Long term (current) use of aspirin: Secondary | ICD-10-CM | POA: Insufficient documentation

## 2018-08-01 DIAGNOSIS — N182 Chronic kidney disease, stage 2 (mild): Secondary | ICD-10-CM | POA: Diagnosis not present

## 2018-08-01 DIAGNOSIS — Z9119 Patient's noncompliance with other medical treatment and regimen: Secondary | ICD-10-CM | POA: Diagnosis not present

## 2018-08-01 DIAGNOSIS — R197 Diarrhea, unspecified: Secondary | ICD-10-CM | POA: Diagnosis not present

## 2018-08-01 DIAGNOSIS — I700 Atherosclerosis of aorta: Secondary | ICD-10-CM | POA: Diagnosis not present

## 2018-08-01 DIAGNOSIS — Z8673 Personal history of transient ischemic attack (TIA), and cerebral infarction without residual deficits: Secondary | ICD-10-CM | POA: Diagnosis not present

## 2018-08-01 DIAGNOSIS — C169 Malignant neoplasm of stomach, unspecified: Secondary | ICD-10-CM | POA: Insufficient documentation

## 2018-08-01 DIAGNOSIS — Z9221 Personal history of antineoplastic chemotherapy: Secondary | ICD-10-CM | POA: Diagnosis not present

## 2018-08-01 DIAGNOSIS — I129 Hypertensive chronic kidney disease with stage 1 through stage 4 chronic kidney disease, or unspecified chronic kidney disease: Secondary | ICD-10-CM | POA: Insufficient documentation

## 2018-08-01 DIAGNOSIS — Z79899 Other long term (current) drug therapy: Secondary | ICD-10-CM | POA: Insufficient documentation

## 2018-08-01 DIAGNOSIS — Z8249 Family history of ischemic heart disease and other diseases of the circulatory system: Secondary | ICD-10-CM | POA: Insufficient documentation

## 2018-08-01 DIAGNOSIS — F17290 Nicotine dependence, other tobacco product, uncomplicated: Secondary | ICD-10-CM | POA: Insufficient documentation

## 2018-08-01 DIAGNOSIS — G4700 Insomnia, unspecified: Secondary | ICD-10-CM | POA: Diagnosis not present

## 2018-08-01 DIAGNOSIS — R066 Hiccough: Secondary | ICD-10-CM | POA: Insufficient documentation

## 2018-08-01 DIAGNOSIS — C163 Malignant neoplasm of pyloric antrum: Secondary | ICD-10-CM | POA: Diagnosis not present

## 2018-08-01 LAB — CBC WITH DIFFERENTIAL/PLATELET
Abs Immature Granulocytes: 0.05 10*3/uL (ref 0.00–0.07)
BASOS ABS: 0 10*3/uL (ref 0.0–0.1)
BASOS PCT: 0 %
EOS PCT: 3 %
Eosinophils Absolute: 0.1 10*3/uL (ref 0.0–0.5)
HCT: 30.2 % — ABNORMAL LOW (ref 39.0–52.0)
Hemoglobin: 9.2 g/dL — ABNORMAL LOW (ref 13.0–17.0)
Immature Granulocytes: 1 %
Lymphocytes Relative: 47 %
Lymphs Abs: 2.2 10*3/uL (ref 0.7–4.0)
MCH: 25.1 pg — AB (ref 26.0–34.0)
MCHC: 30.5 g/dL (ref 30.0–36.0)
MCV: 82.3 fL (ref 80.0–100.0)
Monocytes Absolute: 0.9 10*3/uL (ref 0.1–1.0)
Monocytes Relative: 19 %
NEUTROS PCT: 30 %
NRBC: 0 % (ref 0.0–0.2)
Neutro Abs: 1.4 10*3/uL — ABNORMAL LOW (ref 1.7–7.7)
PLATELETS: 249 10*3/uL (ref 150–400)
RBC: 3.67 MIL/uL — AB (ref 4.22–5.81)
RDW: 23.6 % — AB (ref 11.5–15.5)
WBC: 4.8 10*3/uL (ref 4.0–10.5)

## 2018-08-01 LAB — COMPREHENSIVE METABOLIC PANEL
ALK PHOS: 57 U/L (ref 38–126)
ALT: 11 U/L (ref 0–44)
ANION GAP: 9 (ref 5–15)
AST: 16 U/L (ref 15–41)
Albumin: 3 g/dL — ABNORMAL LOW (ref 3.5–5.0)
BUN: 20 mg/dL (ref 6–20)
CALCIUM: 8.9 mg/dL (ref 8.9–10.3)
CHLORIDE: 99 mmol/L (ref 98–111)
CO2: 26 mmol/L (ref 22–32)
Creatinine, Ser: 0.95 mg/dL (ref 0.61–1.24)
GFR calc non Af Amer: >60 mL/min (ref >60)
Glucose, Bld: 93 mg/dL (ref 70–99)
POTASSIUM: 4.3 mmol/L (ref 3.5–5.1)
SODIUM: 134 mmol/L — AB (ref 135–145)
Total Bilirubin: 0.5 mg/dL (ref 0.3–1.2)
Total Protein: 6.4 g/dL — ABNORMAL LOW (ref 6.5–8.1)

## 2018-08-01 LAB — MAGNESIUM: MAGNESIUM: 1.9 mg/dL (ref 1.7–2.4)

## 2018-08-01 LAB — LACTATE DEHYDROGENASE: LDH: 120 U/L (ref 98–192)

## 2018-08-01 NOTE — Progress Notes (Addendum)
Nutrition Follow-up  57 y/o male PMHx CAD, CKD2, PVD, CVA, Tobacco abuse, HTN/HLD. Developed acute epigastric pain w/ n/v and globus sensation Dec of 2018. EGD would later show GEJ ulcers, s/p biopsy + for gastric carcinoma. Underwent 4 cycles of FLOT chemo regimen..Pt initially decided to forego recommended surgical intervention. 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 Began adjuvant chemotherapy on 9/24, adjuvant radiation to start today 11/5  Following up w/ pt s/p completion of IV chemo. Scheduled to start radiotherapy today.   When last seen, he had restarted prostat on own volition as he says it helps decrease the poor taste of the tube feeding. As such, his regimen has been Osmolite 1.5 @65  x12 hrs w/ 60 ml flush before and after +30 ml promod q 24 hrs w/ 30 ml flush before and after.  Noted in chart that he again went to ED for PEG issues.This is third time been to the ED for PEG issues (10/6, 10/9, 10/17) ____________  Today, his weight is 175 lbs, however, he has on heavy hunting gear and boots which are likely inflating weight.   Patient's reported regimen yet again is different from what had been agreed upon the appt before. Today, he says he is running the Osmolite 1.5 @65 /hr however, he now says the time he is on the pump is typically 5pm-8 am (15 hrs), "unless I got something to do", in which case he shortens it to 8pm-8 am. He puts in 4-4.5 cans to infuse. He says he is infusing "33cc of Promod". RD asked if he meant 30 and he says no, "33 cc".   Orally, He is now only drinking 1 Ensure/day. He says he is still eating by mouth, but it is near impossible to determine quantity from his report. Items he reports eating include "chicken, fish sticks, baked fish, twinkies, oatmeal"  Noted from past MD note he has been having dx. He says this started when he began radiation. (Pt repeatedly stated he had started radiation  over 1 week ago). However, clarified w/ MD after appointment that he has not yet started it, pt just believes he has.   RD asked about hydration. Unable to determine how much water he puts through tube. RD asked if his urine is clear. He says, "no, it is yellow as dirt". However, he says "that's from the weed". He says he smokes marajuana constantly and this alters his urine color. He says he drinks water all the time and does not believe he is dehydrated.   As he was walking out door, he says he has woken up 2x having found that he urinated on himself. He is worried about this.   Pt weight on 10/28 was 169 lbs. This is a acute loss of 4-5 lbs in the past 3 weeks and loss of ~9 lbs x4 weeks. However, long term, he is the same weight as he was about 2 months ago.   Wt Readings from Last 10 Encounters:  07/24/18 169 lb 1.6 oz (76.7 kg)  07/13/18 173 lb (78.5 kg)  07/11/18 174 lb 3.2 oz (79 kg)  07/05/18 173 lb 8 oz (78.7 kg)  07/05/18 178 lb 9.2 oz (81 kg)  07/02/18 178 lb 9.2 oz (81 kg)  06/20/18 177 lb 9.6 oz (80.6 kg)  06/15/18 172 lb (78 kg)  06/14/18 172 lb (78 kg)  05/31/18 172 lb 6.4 oz (78.2 kg)   MEDICATIONS:  Chemotherapy: Xeloda Other: Thorazine,  Ensure Enlive, Zofran, Compazine, Oxycodone, Robaxin  LABS: Albumin: 3.0 (10/28:3.8), Na: 134, Glu: 93,  Renal labs stable  Recent Labs  Lab 08/01/18 0917  NA 134*  K 4.3  CL 99  CO2 26  BUN 20  CREATININE 0.95  CALCIUM 8.9  MG 1.9  GLUCOSE 93     ANTHROPOMETRICS: Height:  Ht Readings from Last 1 Encounters:  07/13/18 6' 1"  (1.854 m)   Weight:  Wt Readings from Last 1 Encounters:  07/24/18 169 lb 1.6 oz (76.7 kg)   BMI:  BMI Readings from Last 1 Encounters:  07/24/18 22.31 kg/m   UBW: 200-205 lbs IBW: 83.6 kg Wt change: -4-5 lbs in the past 3 weeks and loss of ~9 lbs x4 weeks. Long term, he is same weight as he was about 2 months ago.   ESTIMATED ENERGY NEEDS:  Kcal: 2300-2450 (30-32 kcal/kg  bw) Protein:107-123g Pro (1.4-1.6g/kg bw) Fluid: >2.3 L fluid (30 ml/kg bw)  NUTRITION DIAGNOSIS:  Increased protein/kcal needs related to cancer and cancer related treatments as evidenced by the estimated nutritional requirements for this condition  Ongoing  DOCUMENTATION CODES:  Not applicable  INTERVENTION:  Pt is a exceedingly poor historian and it is exceptionally difficult to determine exactly what he has been doing with his tube feeding or how much he is eating. His reported symptoms and side effects are also always in flux and never the same.   Given that he is now only drinking 1 Ensure/day and that he has lost weight, RD recommended he increase his TF to 75cc over ~12-15 hrs. He says he is running ~4-4.5 cans each day currently? Asked him to put in 5 cans. If he runs it x15 hours, this will equate to 4.75 cans of TF. He is agreeable to this.    Spoke to MD regarding the miscellaneous issues pt reported. Will speak with nurse navigator. He may need further education on diarrhea management. Onset of this sounds to have been acute, doubt it is TF related.   His new regimen is as follows:  Osmolite 1.5 @75  x12-15 hrs w/ 60 ml flush before and after +30 ml promod q 24 hrs w/ 60 ml flush before and after.  Depending on if he runs the TF for 12 or 15 hours, this will provide him: 1450-1790 kcals, 66g-81g Pro and 926 ml-1097 fluid.   He will meet remains needs with 1 Ensure (350 kcals, 20g Pro) and his oral intake. Urged to continue making sure to drink adequate fluids by mouth.   Will continue to monitor weight closely, as this is really the only feedback tool that RD has available given his unreliable oral reporting   RD provided him his 7th case of Ensure.  GOAL:   Oral intake to meet >90% of estimate needs  Wt stability vs gain  Patient will demonstrate compliance w/ TF  Not met  MONITOR:  Weight, TF tolerance, oral diet tolerance, tolerance to  chemotherapy/radiation  Ongoing  Next Visit: 11/19 at office visit  [name] RD, LDN, [place] Clinical Nutrition Available Tues-Sat via Pager: [phone] 08/01/2018 11:10 AM

## 2018-08-02 DIAGNOSIS — C169 Malignant neoplasm of stomach, unspecified: Secondary | ICD-10-CM | POA: Diagnosis not present

## 2018-08-02 DIAGNOSIS — Z510 Encounter for antineoplastic radiation therapy: Secondary | ICD-10-CM | POA: Diagnosis not present

## 2018-08-03 DIAGNOSIS — C169 Malignant neoplasm of stomach, unspecified: Secondary | ICD-10-CM | POA: Diagnosis not present

## 2018-08-03 DIAGNOSIS — Z510 Encounter for antineoplastic radiation therapy: Secondary | ICD-10-CM | POA: Diagnosis not present

## 2018-08-04 DIAGNOSIS — C169 Malignant neoplasm of stomach, unspecified: Secondary | ICD-10-CM | POA: Diagnosis not present

## 2018-08-04 DIAGNOSIS — Z510 Encounter for antineoplastic radiation therapy: Secondary | ICD-10-CM | POA: Diagnosis not present

## 2018-08-07 DIAGNOSIS — C169 Malignant neoplasm of stomach, unspecified: Secondary | ICD-10-CM | POA: Diagnosis not present

## 2018-08-07 DIAGNOSIS — Z510 Encounter for antineoplastic radiation therapy: Secondary | ICD-10-CM | POA: Diagnosis not present

## 2018-08-07 IMAGING — DX DG CHEST 2V
2 series · 2 of 2 positions shown · non-contrast
Comparison: 01/24/2018

CLINICAL DATA: NEAR SYNCOPE, PER ER NOTE, Pt states was sitting out
side smoking Belkys this am and started having dizziness. Also states
has had chest discomfort since yesterday. Pt reports he has stomach
cancerHISTORY OF HTN, STROKE, CAD, CANCER, CKD

EXAM:
CHEST - 2 VIEW

[chest lat]
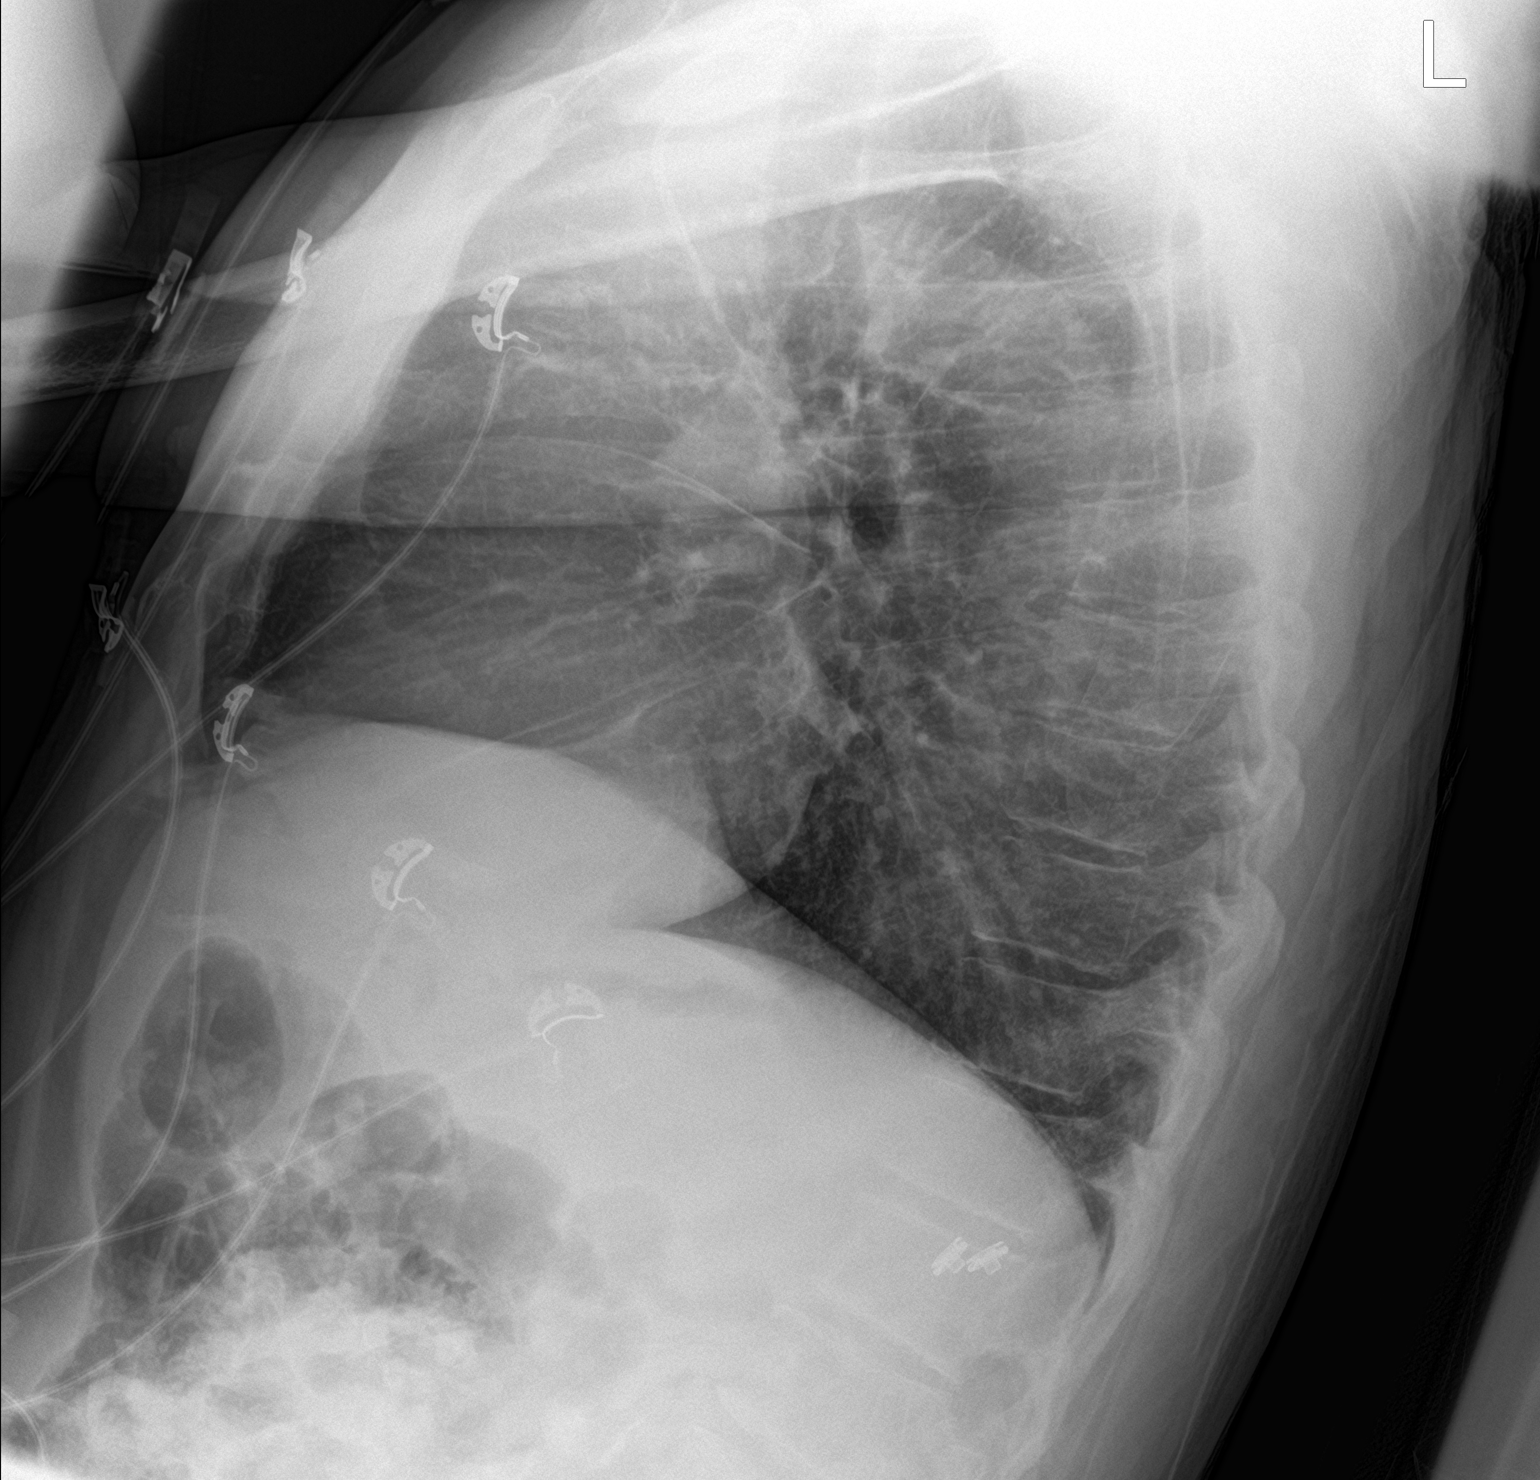

[chest ap]
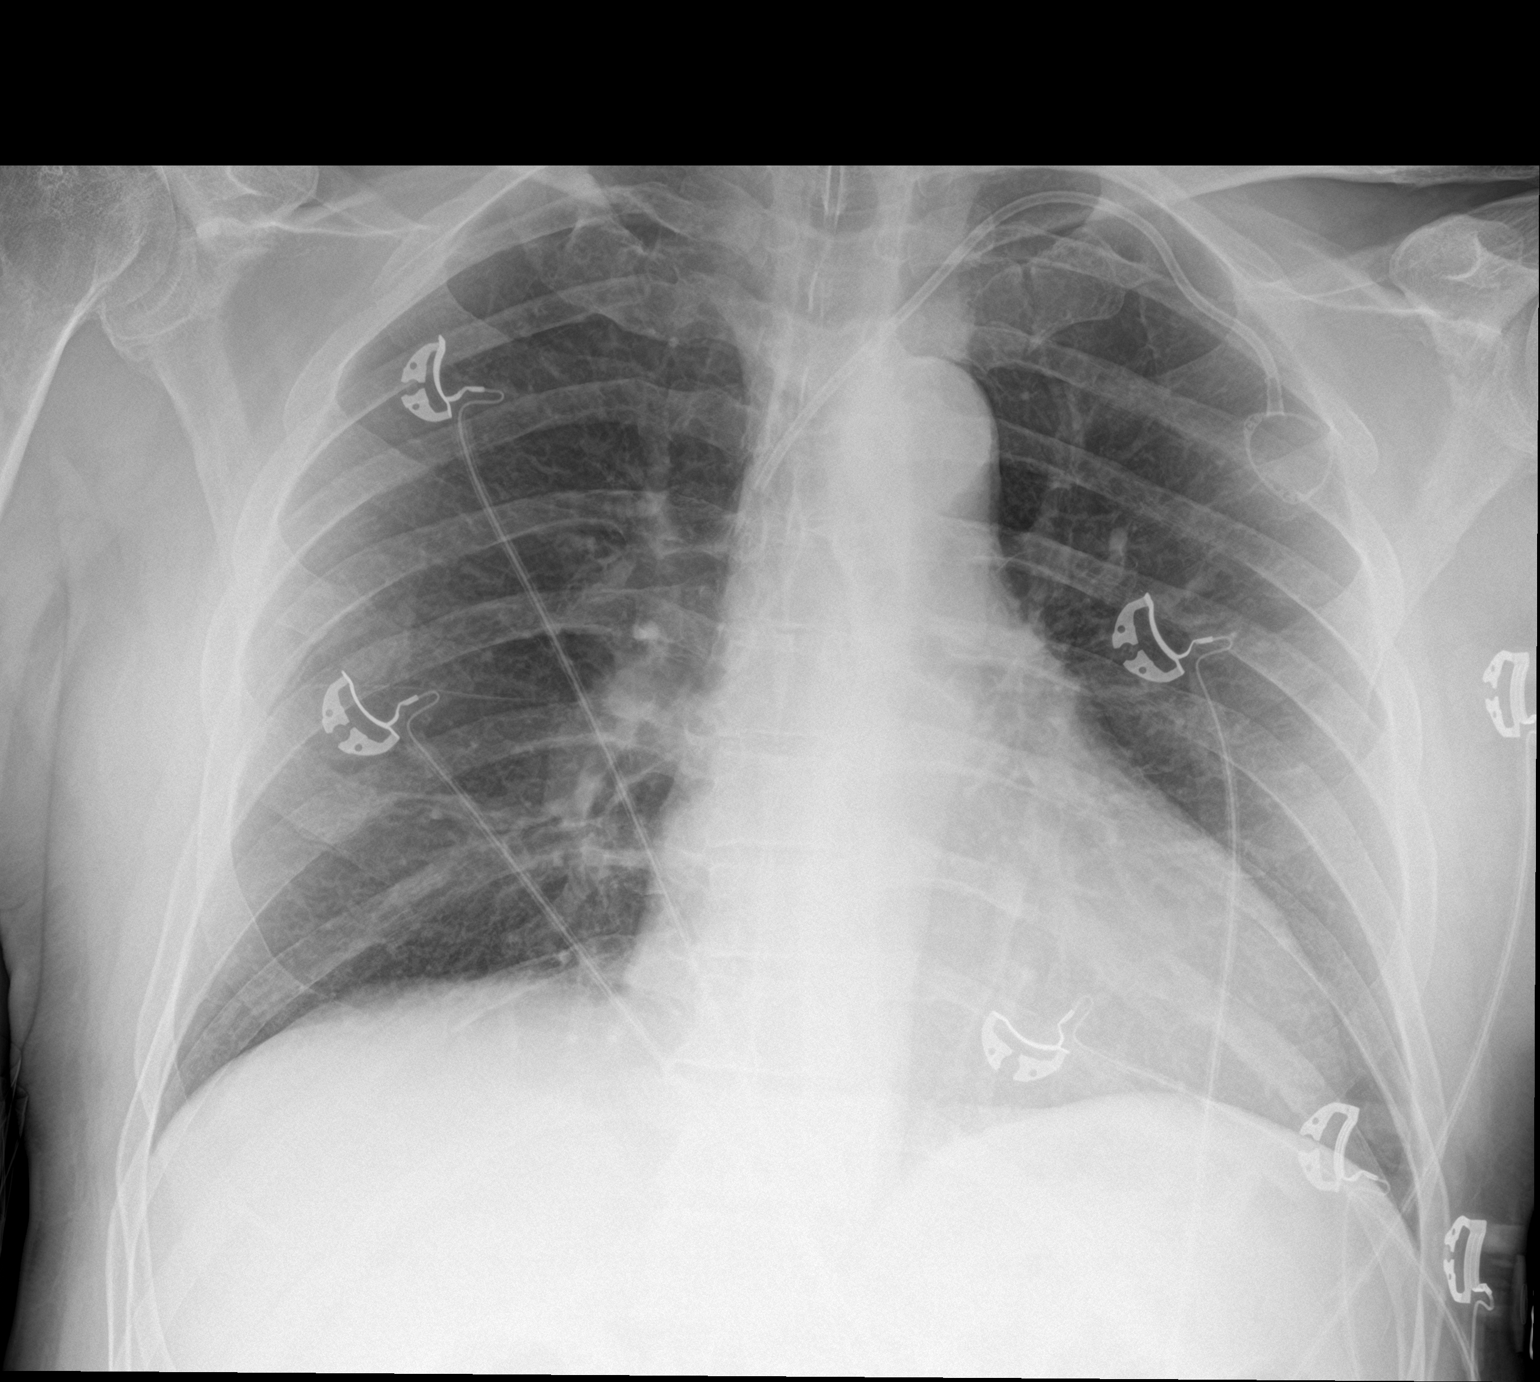

[2 of 2 positions shown; findings below may reference images not displayed]

FINDINGS: Cardiac silhouette is normal size. No mediastinal or hilar masses.
No convincing adenopathy.

Clear lungs.  No pleural effusion or pneumothorax.

Left anterior chest wall Port-A-Cath is stable.

Skeletal structures are intact.
IMPRESSION: No active cardiopulmonary disease.

## 2018-08-08 ENCOUNTER — Other Ambulatory Visit (HOSPITAL_COMMUNITY): Payer: Self-pay

## 2018-08-08 ENCOUNTER — Inpatient Hospital Stay (HOSPITAL_COMMUNITY): Payer: Medicare Other

## 2018-08-08 DIAGNOSIS — Z8249 Family history of ischemic heart disease and other diseases of the circulatory system: Secondary | ICD-10-CM | POA: Diagnosis not present

## 2018-08-08 DIAGNOSIS — R066 Hiccough: Secondary | ICD-10-CM | POA: Diagnosis not present

## 2018-08-08 DIAGNOSIS — I700 Atherosclerosis of aorta: Secondary | ICD-10-CM | POA: Diagnosis not present

## 2018-08-08 DIAGNOSIS — C163 Malignant neoplasm of pyloric antrum: Secondary | ICD-10-CM | POA: Diagnosis not present

## 2018-08-08 DIAGNOSIS — Z8673 Personal history of transient ischemic attack (TIA), and cerebral infarction without residual deficits: Secondary | ICD-10-CM | POA: Diagnosis not present

## 2018-08-08 DIAGNOSIS — Z9119 Patient's noncompliance with other medical treatment and regimen: Secondary | ICD-10-CM | POA: Diagnosis not present

## 2018-08-08 DIAGNOSIS — G4700 Insomnia, unspecified: Secondary | ICD-10-CM | POA: Diagnosis not present

## 2018-08-08 DIAGNOSIS — Z9221 Personal history of antineoplastic chemotherapy: Secondary | ICD-10-CM | POA: Diagnosis not present

## 2018-08-08 DIAGNOSIS — Z510 Encounter for antineoplastic radiation therapy: Secondary | ICD-10-CM | POA: Diagnosis not present

## 2018-08-08 DIAGNOSIS — C169 Malignant neoplasm of stomach, unspecified: Secondary | ICD-10-CM | POA: Diagnosis not present

## 2018-08-08 DIAGNOSIS — R197 Diarrhea, unspecified: Secondary | ICD-10-CM | POA: Diagnosis not present

## 2018-08-08 DIAGNOSIS — N182 Chronic kidney disease, stage 2 (mild): Secondary | ICD-10-CM | POA: Diagnosis not present

## 2018-08-08 DIAGNOSIS — Z79899 Other long term (current) drug therapy: Secondary | ICD-10-CM | POA: Diagnosis not present

## 2018-08-08 DIAGNOSIS — Z7982 Long term (current) use of aspirin: Secondary | ICD-10-CM | POA: Diagnosis not present

## 2018-08-08 DIAGNOSIS — I129 Hypertensive chronic kidney disease with stage 1 through stage 4 chronic kidney disease, or unspecified chronic kidney disease: Secondary | ICD-10-CM | POA: Diagnosis not present

## 2018-08-08 LAB — CBC WITH DIFFERENTIAL/PLATELET
ABS IMMATURE GRANULOCYTES: 0.08 10*3/uL — AB (ref 0.00–0.07)
BASOS ABS: 0 10*3/uL (ref 0.0–0.1)
Basophils Relative: 0 %
EOS ABS: 0 10*3/uL (ref 0.0–0.5)
Eosinophils Relative: 1 %
HCT: 31.7 % — ABNORMAL LOW (ref 39.0–52.0)
HEMOGLOBIN: 9.7 g/dL — AB (ref 13.0–17.0)
Immature Granulocytes: 1 %
LYMPHS ABS: 1.5 10*3/uL (ref 0.7–4.0)
LYMPHS PCT: 22 %
MCH: 25 pg — ABNORMAL LOW (ref 26.0–34.0)
MCHC: 30.6 g/dL (ref 30.0–36.0)
MCV: 81.7 fL (ref 80.0–100.0)
Monocytes Absolute: 0.7 10*3/uL (ref 0.1–1.0)
Monocytes Relative: 11 %
NEUTROS ABS: 4.2 10*3/uL (ref 1.7–7.7)
NRBC: 0 % (ref 0.0–0.2)
Neutrophils Relative %: 65 %
Platelets: 386 10*3/uL (ref 150–400)
RBC: 3.88 MIL/uL — ABNORMAL LOW (ref 4.22–5.81)
RDW: 25 % — ABNORMAL HIGH (ref 11.5–15.5)
WBC: 6.5 10*3/uL (ref 4.0–10.5)

## 2018-08-08 LAB — COMPREHENSIVE METABOLIC PANEL
ALBUMIN: 3.2 g/dL — AB (ref 3.5–5.0)
ALK PHOS: 68 U/L (ref 38–126)
ALT: 12 U/L (ref 0–44)
ANION GAP: 6 (ref 5–15)
AST: 18 U/L (ref 15–41)
BUN: 21 mg/dL — ABNORMAL HIGH (ref 6–20)
CALCIUM: 9 mg/dL (ref 8.9–10.3)
CO2: 27 mmol/L (ref 22–32)
Chloride: 100 mmol/L (ref 98–111)
Creatinine, Ser: 0.88 mg/dL (ref 0.61–1.24)
GFR calc Af Amer: >60 mL/min (ref >60)
GFR calc non Af Amer: >60 mL/min (ref >60)
Glucose, Bld: 97 mg/dL (ref 70–99)
Potassium: 4.4 mmol/L (ref 3.5–5.1)
SODIUM: 133 mmol/L — AB (ref 135–145)
Total Bilirubin: 0.5 mg/dL (ref 0.3–1.2)
Total Protein: 6.8 g/dL (ref 6.5–8.1)

## 2018-08-08 LAB — URINALYSIS, DIPSTICK ONLY
Bilirubin Urine: NEGATIVE
Glucose, UA: NEGATIVE mg/dL
Hgb urine dipstick: NEGATIVE
KETONES UR: NEGATIVE mg/dL
LEUKOCYTES UA: NEGATIVE
Nitrite: NEGATIVE
PROTEIN: NEGATIVE mg/dL
Specific Gravity, Urine: 1.023 (ref 1.005–1.030)
pH: 5 (ref 5.0–8.0)

## 2018-08-08 LAB — LACTATE DEHYDROGENASE: LDH: 143 U/L (ref 98–192)

## 2018-08-08 NOTE — Progress Notes (Signed)
Pt came up to clinic today stating he has pain when he urinates, no burning just pain with urination, states urine is dark in color.   Will get a UA and culture.Will let MD know

## 2018-08-09 DIAGNOSIS — Z23 Encounter for immunization: Secondary | ICD-10-CM | POA: Diagnosis not present

## 2018-08-09 DIAGNOSIS — I10 Essential (primary) hypertension: Secondary | ICD-10-CM | POA: Diagnosis not present

## 2018-08-09 DIAGNOSIS — Z510 Encounter for antineoplastic radiation therapy: Secondary | ICD-10-CM | POA: Diagnosis not present

## 2018-08-09 DIAGNOSIS — I2510 Atherosclerotic heart disease of native coronary artery without angina pectoris: Secondary | ICD-10-CM | POA: Diagnosis not present

## 2018-08-09 DIAGNOSIS — E782 Mixed hyperlipidemia: Secondary | ICD-10-CM | POA: Diagnosis not present

## 2018-08-09 DIAGNOSIS — Z1389 Encounter for screening for other disorder: Secondary | ICD-10-CM | POA: Diagnosis not present

## 2018-08-09 DIAGNOSIS — I739 Peripheral vascular disease, unspecified: Secondary | ICD-10-CM | POA: Diagnosis not present

## 2018-08-09 DIAGNOSIS — C169 Malignant neoplasm of stomach, unspecified: Secondary | ICD-10-CM | POA: Diagnosis not present

## 2018-08-10 ENCOUNTER — Other Ambulatory Visit (HOSPITAL_COMMUNITY): Payer: Self-pay | Admitting: Internal Medicine

## 2018-08-10 DIAGNOSIS — C169 Malignant neoplasm of stomach, unspecified: Secondary | ICD-10-CM | POA: Diagnosis not present

## 2018-08-10 DIAGNOSIS — Z510 Encounter for antineoplastic radiation therapy: Secondary | ICD-10-CM | POA: Diagnosis not present

## 2018-08-10 LAB — URINE CULTURE: Culture: NO GROWTH

## 2018-08-11 DIAGNOSIS — C169 Malignant neoplasm of stomach, unspecified: Secondary | ICD-10-CM | POA: Diagnosis not present

## 2018-08-11 DIAGNOSIS — Z510 Encounter for antineoplastic radiation therapy: Secondary | ICD-10-CM | POA: Diagnosis not present

## 2018-08-12 DIAGNOSIS — C169 Malignant neoplasm of stomach, unspecified: Secondary | ICD-10-CM | POA: Diagnosis not present

## 2018-08-14 DIAGNOSIS — Z510 Encounter for antineoplastic radiation therapy: Secondary | ICD-10-CM | POA: Diagnosis not present

## 2018-08-14 DIAGNOSIS — C169 Malignant neoplasm of stomach, unspecified: Secondary | ICD-10-CM | POA: Diagnosis not present

## 2018-08-15 ENCOUNTER — Inpatient Hospital Stay (HOSPITAL_BASED_OUTPATIENT_CLINIC_OR_DEPARTMENT_OTHER): Payer: Medicare Other | Admitting: Internal Medicine

## 2018-08-15 ENCOUNTER — Encounter (HOSPITAL_COMMUNITY): Payer: Self-pay | Admitting: Dietician

## 2018-08-15 ENCOUNTER — Inpatient Hospital Stay (HOSPITAL_COMMUNITY): Payer: Medicare Other

## 2018-08-15 ENCOUNTER — Other Ambulatory Visit: Payer: Self-pay

## 2018-08-15 VITALS — BP 133/88 | HR 104 | Temp 98.6°F | Resp 18 | Wt 172.8 lb

## 2018-08-15 DIAGNOSIS — I700 Atherosclerosis of aorta: Secondary | ICD-10-CM

## 2018-08-15 DIAGNOSIS — Z7982 Long term (current) use of aspirin: Secondary | ICD-10-CM

## 2018-08-15 DIAGNOSIS — Z9119 Patient's noncompliance with other medical treatment and regimen: Secondary | ICD-10-CM | POA: Diagnosis not present

## 2018-08-15 DIAGNOSIS — G4700 Insomnia, unspecified: Secondary | ICD-10-CM

## 2018-08-15 DIAGNOSIS — I129 Hypertensive chronic kidney disease with stage 1 through stage 4 chronic kidney disease, or unspecified chronic kidney disease: Secondary | ICD-10-CM

## 2018-08-15 DIAGNOSIS — N182 Chronic kidney disease, stage 2 (mild): Secondary | ICD-10-CM | POA: Diagnosis not present

## 2018-08-15 DIAGNOSIS — R066 Hiccough: Secondary | ICD-10-CM | POA: Diagnosis not present

## 2018-08-15 DIAGNOSIS — C169 Malignant neoplasm of stomach, unspecified: Secondary | ICD-10-CM

## 2018-08-15 DIAGNOSIS — C163 Malignant neoplasm of pyloric antrum: Secondary | ICD-10-CM | POA: Diagnosis not present

## 2018-08-15 DIAGNOSIS — Z8673 Personal history of transient ischemic attack (TIA), and cerebral infarction without residual deficits: Secondary | ICD-10-CM

## 2018-08-15 DIAGNOSIS — R197 Diarrhea, unspecified: Secondary | ICD-10-CM

## 2018-08-15 DIAGNOSIS — Z8249 Family history of ischemic heart disease and other diseases of the circulatory system: Secondary | ICD-10-CM

## 2018-08-15 DIAGNOSIS — Z79899 Other long term (current) drug therapy: Secondary | ICD-10-CM | POA: Diagnosis not present

## 2018-08-15 DIAGNOSIS — F17210 Nicotine dependence, cigarettes, uncomplicated: Secondary | ICD-10-CM

## 2018-08-15 DIAGNOSIS — Z9221 Personal history of antineoplastic chemotherapy: Secondary | ICD-10-CM | POA: Diagnosis not present

## 2018-08-15 DIAGNOSIS — Z510 Encounter for antineoplastic radiation therapy: Secondary | ICD-10-CM | POA: Diagnosis not present

## 2018-08-15 LAB — COMPREHENSIVE METABOLIC PANEL
ALT: 12 U/L (ref 0–44)
AST: 17 U/L (ref 15–41)
Albumin: 3.4 g/dL — ABNORMAL LOW (ref 3.5–5.0)
Alkaline Phosphatase: 74 U/L (ref 38–126)
Anion gap: 7 (ref 5–15)
BILIRUBIN TOTAL: 0.7 mg/dL (ref 0.3–1.2)
BUN: 23 mg/dL — AB (ref 6–20)
CO2: 27 mmol/L (ref 22–32)
CREATININE: 0.84 mg/dL (ref 0.61–1.24)
Calcium: 9.5 mg/dL (ref 8.9–10.3)
Chloride: 102 mmol/L (ref 98–111)
GFR calc Af Amer: >60 mL/min (ref >60)
Glucose, Bld: 101 mg/dL — ABNORMAL HIGH (ref 70–99)
POTASSIUM: 4.8 mmol/L (ref 3.5–5.1)
Sodium: 136 mmol/L (ref 135–145)
TOTAL PROTEIN: 7.1 g/dL (ref 6.5–8.1)

## 2018-08-15 LAB — CBC WITH DIFFERENTIAL/PLATELET
Abs Immature Granulocytes: 0.07 10*3/uL (ref 0.00–0.07)
BASOS PCT: 0 %
Basophils Absolute: 0 10*3/uL (ref 0.0–0.1)
EOS PCT: 2 %
Eosinophils Absolute: 0.1 10*3/uL (ref 0.0–0.5)
HEMATOCRIT: 32 % — AB (ref 39.0–52.0)
Hemoglobin: 9.7 g/dL — ABNORMAL LOW (ref 13.0–17.0)
IMMATURE GRANULOCYTES: 1 %
Lymphocytes Relative: 19 %
Lymphs Abs: 1.1 10*3/uL (ref 0.7–4.0)
MCH: 25.6 pg — AB (ref 26.0–34.0)
MCHC: 30.3 g/dL (ref 30.0–36.0)
MCV: 84.4 fL (ref 80.0–100.0)
MONO ABS: 0.7 10*3/uL (ref 0.1–1.0)
Monocytes Relative: 12 %
NEUTROS ABS: 3.8 10*3/uL (ref 1.7–7.7)
NRBC: 0 % (ref 0.0–0.2)
Neutrophils Relative %: 66 %
Platelets: 267 10*3/uL (ref 150–400)
RBC: 3.79 MIL/uL — ABNORMAL LOW (ref 4.22–5.81)
RDW: 27.9 % — AB (ref 11.5–15.5)
WBC: 5.7 10*3/uL (ref 4.0–10.5)

## 2018-08-15 MED ORDER — OXYCODONE HCL 5 MG PO TABS: 5.0000 mg | ORAL_TABLET | Freq: Four times a day (QID) | ORAL | 0 refills | Status: DC | PRN

## 2018-08-15 NOTE — Progress Notes (Signed)
Diagnosis Gastric carcinoma ([place]) - Plan: CBC with Differential/Platelet, Comprehensive metabolic panel, Lactate dehydrogenase  Staging Cancer Staging No matching staging information was found for the patient.  Assessment and Plan:  1.  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  57 y.o. male initially seen by Dr. Sherrine Maples and reported he was in his usual state of health until around Christmas 2018 when he went camping in [place].  During that trip patient apparently had acute onset of epigastric pain associated with vomiting.  He reports retching with vomiting.  Sometime during that episode patient apparently attempted to put his fingers in the back of his throat to relieve the sensation of food being lodged thinking that he may have had a fish bone that was stuck in his throat.  This culminated in vomiting with small amount of blood.  He subsequently had relief from vomiting but he continued to have difficulty swallowing and hoarseness of voice.  Imaging of the neck showed posterior pharyngeal edema without evidence of any foreign object or fishbone.  The pain subsequently improved gradually.    He underwent a colonoscopy that showed tubular adenomas.  An EGD that revealed diffusely infiltrated wall of the stomach with superficial ulcerations.  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    PET scan was done 10/24/2017 and showed IMPRESSION: 1. Small focus of hypermetabolism along the lesser curvature of the antrum of the stomach, corresponding to recently diagnosed gastric neoplasm. No associated lymphadenopathy or definite findings to suggest metastatic disease to the neck, chest, abdomen or pelvis. 2. Aortic atherosclerosis, in addition to left main and 3 vessel coronary artery disease. Please note that although the presence of coronary artery calcium documents the presence of coronary artery disease,  the severity of this disease and any potential stenosis cannot be assessed on this non-gated CT examination. Assessment for potential risk factor modification, dietary therapy or pharmacologic therapy may be warranted, if clinically indicated. 3. In addition, there is fusiform aneurysmal dilatation of the infrarenal abdominal aorta which measures up to 3.9 x 3.7 cm. Recommend followup by ultrasound in 2 years. This recommendation follows ACR consensus guidelines: White Paper of the ACR Incidental Findings Committee II on Vascular Findings. J Am Coll Radiol 2013; 10:789-794.  He Is followed by Dr. Barry Dienes of surgery.  He initially indicated he did not desire surgery or chemotherapy.   Pt has diffuse gastric adenocarcinoma that appears to be localized to the stomach.  Histologically he appears to have a signet ring variety with diffuse involvement.  He was treated with neoadjuvant chemotherapy followed by surgery followed by adjuvant chemotherapy.  Previously, I have discussed with him that even if he were to be treated with surgery alone based on the diffuse nature of his cancer he was still be recommended for adjuvant therapy.    He was treated with FLOT regimen (5-FU dose at 2600 mg/m with 24 hours, oxaliplatin 85 mg/m, leucovorin 200 mg/m, and Taxotere 50 mg/m all on day 1 every 2 weeks for 4 cycles before surgery and 4 cycles following surgery.   PET scan done 10/24/2017 showed no evidence of distant disease.    EUS that was done 11/24/2017 showed:  Normal esophagus, ulcerated mass involving the lesser curvature of the stomach approximately mid body.  Mass measured 5 cm across 5 cm in length.  Mass appeared to be 7- 8 cm from the pylorus.  It was also 5 to 6 cm  from the GE junction.  It appeared to pass into and through the muscularis propria as well as the serosa making it a T4 a lesion.  Mass was 1.6 cm in maximal thickness.  No adenopathy was noted.  He was staged as a T4 a N0 by EUS.     He has undergone genetic testing that was negative.     He has completed 4 cycles of FLOT.  Pt failed to keep his follow-up to go over PET scan and was referred to Dr. Barry Dienes for surgical evaluation.  He was planned for surgery on 04/26/2018.    Pt cancelled surgery and wanted  to wait until next year for operation.  He was also reporting port problems.    Pet scan done 03/20/2018 showed IMPRESSION: 1. No evidence of residual gastric carcinoma. 2. No evidence of carcinoma metastasis in the chest, abdomen, or pelvis. 3. Coronary artery calcification and Aortic Atherosclerosis (ICD10-I70.0).  Previously I discussed with him scan findings are due to chemotherapy and if he remains off treatment for a significant amount of time he has a significant risk for morbidity and mortality if he continues to be noncompliant with recommendations.  I have discussed with him he has a significant risk for death from advanced gastric cancer if he continues to defer surgery and adjuvant chemotherapy especially until next year which is what he was requesting.    After discussion he continued to defer surgery until after 04/29/2018.   Pt underwent total gastrectomy on 05/04/2018 Pathology returned as  1. Stomach, resection for tumor - INVASIVE ADENOCARCINOMA, POORLY DIFFERENTIATED, SPANNING AT LEAST 3.2 CM. - ADENOCARCINOMA IS PRESENT AT THE PROXIMAL MARGIN OF SPECIMEN 1. - LYMPHOVASCULAR INVASION IS IDENTIFIED. - PERINEURAL INVASION IS IDENTIFIED. - ADENOCARICNOMA EXTENDS THROUGH THE MUSCULARIS PROPRIA AND INTO SOFT TISSUE WITH PERITONEAL INVOLVEMENT. - METASTATIC CARCINOMA IN 6 OF 6 LYMPH NODES (6/6). - SEE ONCOLOGY TABLE BELOW. 2. Stomach, resection, Additional proximal margin - INVASIVE ADENOCARCINOMA, PRESENT AT THE PROXIMAL MARGIN OF SPECIMEN 2. - METASTATIC CARCINOMA IN 1 OF 4 LYMPH NODES (1/4). 3. Stomach, resection, additional proximal margin - INVASIVE ADENOCARCINOMA, PRESENT AT THE PROXIMAL MARGIN  OF SPECIMEN 3. 4. Stomach, resection, Additional proximal margin - INVASIVE ADENOCARCINOMA, PRESENT AT THE PROXIMAL MARGIN OF SPECIMEN 4. Microscopic Comment  I discussed with the patient that due to + margin and numerous LN he is recommended for adjuvant chemotherapy and RT.  He initially refused RT but after discussion he is willing to meet with RT for evaluation.    Based on results of CALGB study [id number] pts with resected gastric cancer who received postoperative chemoRT with ECF compared to 5FU based regimen did not show improved survival compared to 5FU before and after RT.  A recent study also showed that use of Xeloda with cisplatin followed by RT with Xeloda was better tolerated and had less toxicity than 82f.  Xeloda is comparable to 5FU with RT.  I discussed with him option of 2 cycles of Xeloda dosed at 1000 mg/m2 bid for 14 days with cisplatin 75 mg/m2 on D1 every 3 weeks followed by Xeloda with RT followed by an additional 1 or 2 cycles of XP.  XP had no difference in survival compared to 5 FU/LV.    ChemoRT demonstrated survival advantage over surgery alone in INT-0116 study.  Pt is recommended for chemo/RT.  Pt does not desire multiple days of chemotherapy.  I have discussed with him option of XP initially followed by Xeloda with RT.  Pt begun RT on 08/01/2018.  He should continue Xeloda  825 mg bid Monday through Friday during RT.  Total dose 1500 mg po bid M-F during RT.  Rx sent to [facility].    Labs done 08/15/2018 reviewed and showed WBC 5.7 HB 9.7 plts 267,000.   Chemistries WNL with K+ 4.8 Cr 0.84 and normal LFTs.    He will have labs done weekly while on RT.  He will be seen for follow-up 08/2018 with repeat labs.  All questions answered and he expressed understanding of the information presented.    2.  Hiccups.  Continue Thorazine as recommended.    3.  Insomnia.  Pt recommended for OTC sleep aids.    4.  Hypertension.  BP 133/88.  Follow-up with PCP.  5.   Smoking.  Cessation is recommended.  PET scan showed no clear lung abnormalities.  6.  Abdominal aortic aneurysm.  This was noted on recent PET scan.  Patient was also noted to have atherosclerosis on recent PET scan.  He has been seen by vascular surgery and cardiology in the past and was on aspirin and statins.  7.  Compliance with treatment recommendations.  This has been a problem with Mr. Todd [name].  He has delayed surgery and had some compliance issues with follow-up. Pt was also not taking Xeloda as prescribed.    8.  Diarrhea.  Imodium prn.    9.  Pain.  Rx for oxycodone sent to pharmacy.    25 minutes spent with more than 50% spent in counseling and coordination of care.     Interval History:  As above.   Current Status:  Pt is seen today for follow-up.  He is here to go over labs.  He is tolerating RT.  He is requesting refills on pain meds.      Malignant neoplasm of stomach ([place])   11/11/2017 Initial Diagnosis    Malignant neoplasm of stomach ([place])    12/16/2017 Genetic Testing    Negative genetic testing on the common hereditary cancer panel.  The Hereditary Gene Panel offered by [facility] includes sequencing and/or deletion duplication testing of the following 47 genes: APC, ATM, AXIN2, BARD1, BMPR1A, BRCA1, BRCA2, BRIP1, CDH1, CDK4, CDKN2A (p14ARF), CDKN2A (p16INK4a), CHEK2, CTNNA1, DICER1, EPCAM (Deletion/duplication testing only), GREM1 (promoter region deletion/duplication testing only), KIT, MEN1, MLH1, MSH2, MSH3, MSH6, MUTYH, NBN, NF1, NHTL1, PALB2, PDGFRA, PMS2, POLD1, POLE, PTEN, RAD50, RAD51C, RAD51D, SDHB, SDHC, SDHD, SMAD4, SMARCA4. STK11, TP53, TSC1, TSC2, and VHL.  The following genes were evaluated for sequence changes only: SDHA and HOXB13 c.251G>A variant only. The report date is December 16, 2017.      Gastric carcinoma ([place])   01/23/2018 Initial Diagnosis    Gastric carcinoma ([place])    01/26/2018 - 03/27/2018 Chemotherapy    The patient had palonosetron (ALOXI)  injection 0.25 mg, 0.25 mg, Intravenous,  Once, 4 of 4 cycles Administration: 0.25 mg (01/31/2018), 0.25 mg (02/14/2018), 0.25 mg (02/28/2018), 0.25 mg (03/14/2018) pegfilgrastim (NEULASTA ONPRO KIT) injection 6 mg, 6 mg, Subcutaneous, Once, 4 of 4 cycles Administration: 6 mg (02/01/2018), 6 mg (03/01/2018) leucovorin 400 mg in dextrose 5 % 250 mL infusion, 428 mg, Intravenous,  Once, 4 of 4 cycles Administration: 400 mg (01/31/2018), 400 mg (02/14/2018), 400 mg (02/28/2018), 400 mg (03/14/2018) oxaliplatin (ELOXATIN) 180 mg in dextrose 5 % 500 mL chemo infusion, 85 mg/m2 = 180 mg, Intravenous,  Once, 4 of 4 cycles Administration: 180 mg (01/31/2018), 180 mg (02/14/2018), 180 mg (  02/28/2018), 180 mg (03/14/2018) DOCEtaxel (TAXOTERE) 110 mg in sodium chloride 0.9 % 250 mL chemo infusion, 50 mg/m2 = 110 mg, Intravenous,  Once, 4 of 4 cycles Administration: 110 mg (01/31/2018), 110 mg (02/14/2018), 110 mg (02/28/2018), 110 mg (03/14/2018) fluorouracil (ADRUCIL) 5,550 mg in sodium chloride 0.9 % 139 mL chemo infusion, 2,600 mg/m2 = 5,550 mg, Intravenous, 1 Day/Dose, 4 of 4 cycles Administration: 5,550 mg (01/31/2018), 5,550 mg (02/14/2018), 5,550 mg (02/28/2018), 5,550 mg (03/14/2018)  for chemotherapy treatment.     06/19/2018 -  Chemotherapy    The patient had palonosetron (ALOXI) injection 0.25 mg, 0.25 mg, Intravenous,  Once, 2 of 2 cycles Administration: 0.25 mg (06/20/2018), 0.25 mg (07/11/2018) CISplatin (PLATINOL) 150 mg in sodium chloride 0.9 % 500 mL chemo infusion, 75 mg/m2 = 150 mg (100 % of original dose 75 mg/m2), Intravenous,  Once, 2 of 2 cycles Dose modification: 75 mg/m2 (original dose 75 mg/m2, Cycle 1, Reason: Other (see comments)) Administration: 150 mg (06/20/2018), 150 mg (07/11/2018) fosaprepitant (EMEND) 150 mg, dexamethasone (DECADRON) 12 mg in sodium chloride 0.9 % 145 mL IVPB, , Intravenous,  Once, 2 of 2 cycles Administration:  (06/20/2018),  (07/11/2018)  for chemotherapy treatment.       Problem  List Patient Active Problem List   Diagnosis Date Noted  . Elevated lipase [R74.8] 03/17/2018  . Hypotension [I95.9] 03/17/2018  . Leukocytosis [D72.829] 03/17/2018  . Gastric carcinoma ([place]) [C16.9] 01/23/2018  . Genetic testing [Z13.79] 12/20/2017  . Family history of cancer [Z80.9]   . IBS (irritable bowel syndrome) [K58.9] 11/28/2017  . Malignant neoplasm of stomach ([place]) [C16.9] 11/11/2017  . Constipation [K59.00] 08/30/2017  . GERD (gastroesophageal reflux disease) [K21.9] 08/30/2017  . Globus sensation [R09.89] 08/30/2017  . Cerebral artery occlusion with cerebral infarction ([place]) [I63.50] 12/20/2016  . PAD (peripheral artery disease) ([place]) [I73.9] 08/09/2014  . CAD (coronary artery disease) [I25.10] 07/22/2014  . Tobacco abuse [Z72.0] 07/22/2014  . 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 Pure hypercholesterolemia [E78.00] 05/28/2014  . Hypertension [I10] 07/19/2012  . Hyperlipidemia [E78.5] 07/19/2012  . PVD (peripheral vascular disease) with claudication ([place]) [I73.9] 06/29/2012  . Atherosclerosis of native arteries of the extremities with intermittent claudication [I70.219] 04/20/2012    Past Medical History Past Medical History:  Diagnosis Date  . CAD (coronary artery disease)    a. Cath 07/2012 following abnormal nuclear stress test: 20-30% LAD, 20-30% OM1, occluded branch off OM2 that fills slowly via collaterals, small nondominant RCA occluded proximally, fills slowly via collaterals.  . CKD (chronic kidney disease), stage II   . Family history of cancer   . Gastric adenocarcinoma ([place])   . History of blood transfusion    "when I was a child"  . Hyperlipidemia   . Hypertension   . Peripheral vascular disease ([place])    a. bilateral superficial femoral artery stents in May/August of 2012. b. Left femoral to above-knee popliteal bypass in 08/2012.  Marland Kitchen Plantar fasciitis   . Renal insufficiency   . Stroke [facility])    2008 right side weakness  .  Tobacco abuse     Past Surgical History Past Surgical History:  Procedure Laterality Date  . ABDOMINAL AORTAGRAM N/A 04/27/2012   Procedure: ABDOMINAL Maxcine Ham;  Surgeon: Conrad El Dorado, MD;  Location: [facility] CATH LAB;  Service: Cardiovascular;  Laterality: N/A;  . ABDOMINAL AORTAGRAM N/A 08/09/2014   Procedure: ABDOMINAL Maxcine Ham;  Surgeon: Elam Dutch, MD;  Location[facility] CATH LAB;  Service: Cardiovascular;  Laterality: N/A;  .  BIOPSY  10/06/2017   Procedure: BIOPSY;  Surgeon: Daneil Dolin, MD;  Location: [facility] ENDO SUITE;  Service: Endoscopy;;  gastric   . CARDIAC CATHETERIZATION    . COLONOSCOPY    . COLONOSCOPY WITH PROPOFOL N/A 10/06/2017   Procedure: COLONOSCOPY WITH PROPOFOL;  Surgeon: Daneil Dolin, MD;  Location: [facility] ENDO SUITE;  Service: Endoscopy;  Laterality: N/A;  1:45pm - patient can't come earlier due to transportation  . ESOPHAGOGASTRODUODENOSCOPY (EGD) WITH PROPOFOL N/A 10/06/2017   Procedure: ESOPHAGOGASTRODUODENOSCOPY (EGD) WITH PROPOFOL;  Surgeon: Daneil Dolin, MD;  Location: [facility] END[facility];  Service: Endoscopy;  Laterality: N/A;  . EUS N/A 11/24/2017   Procedure: UPPER ENDOSCOPIC ULTRASOUND (EUS) RADIAL;  Surgeon: Milus Banister, MD;  Location: [facility] ENDOSCOPY;  Service: Endoscopy;  Laterality: N/A;  . FEMORAL ARTERY - POPLITEAL ARTERY BYPASS GRAFT    . FEMORAL-POPLITEAL BYPASS GRAFT  09/26/2012   Procedure: BYPASS GRAFT FEMORAL-POPLITEAL ARTERY;  Surgeon: Elam Dutch, MD;  Location[facility] OR;  Service: Vascular;  Laterality: Left;  using 1m Gore-Tex Propaten Ringed Graft, Vein patch on the proximal and distal anastomosis  . FEMORAL-POPLITEAL BYPASS GRAFT Right 08/12/2014   Procedure:  FEMORAL-POPLITEAL ARTERY BYPASS WITH SAPHENOUS VEIN GRAFT, INTRAOPERATIVE ARTERIOGRAM;  Surgeon: CElam Dutch MD;  Location: [facility]  Service: Vascular;  Laterality: Right;  . GASTRECTOMY N/A 05/04/2018   Procedure: Total Gastrectomy with Feeding Jejunostomy;  Surgeon: BStark Klein  MD;  Location: [facility]  Service: General;  Laterality: N/A;  . INTRAOPERATIVE ARTERIOGRAM  08/12/2014   Procedure: INTRA OPERATIVE ARTERIOGRAM;  Surgeon: CElam Dutch MD;  Location: [facility]  Service: Vascular;;  . LAPAROSCOPY N/A 05/04/2018   Procedure: LAPAROSCOPY DIAGNOSTIC ERAS PATHWAY;  Surgeon: BStark Klein MD;  [place] [facility]  Service: General;  Laterality: N/A;  EPIDURAL  . POLYPECTOMY  10/06/2017   Procedure: POLYPECTOMY;  Surgeon: RDaneil Dolin MD;  [facility];  Service: Endoscopy;;  splenic flexure,  ascending  . PORTACATH PLACEMENT N/A 01/24/2018   Procedure: INSERTION PORT-A-CATH;  Surgeon: BStark Klein MD;  [place] [facility]  Service: General;  Laterality: N/A;  . PR VEIN BYPASS GRAFT,AORTO-FEM-POP    . stents in bil legs      Family History Family History  Problem Relation Age of Onset  . Diabetes Mother   . Heart disease Mother        before age 55 Triple BPG  . Cancer Mother        Luekemia  . Deep vein thrombosis Mother   . Hyperlipidemia Mother   . Hypertension Mother   . Heart attack Mother   . Peripheral vascular disease Mother   . Stroke Mother        x 3  . Pancreatic cancer Mother   . Arthritis Father   . Hypertension Father   . Kidney disease Father   . Cancer Maternal Grandmother        NOS  . Diabetes Cousin   . Cancer Cousin        NOS, pat first cousin  . Cancer Cousin        NOS, pat first cousin  . Colon cancer Neg Hx   . Gastric cancer Neg Hx   . Esophageal cancer Neg Hx      Social History  reports that he has been smoking cigars. He started smoking about 43 years ago. He has a 25.00 pack-year smoking history. He has never used smokeless tobacco. He reports that he  has current or past drug history. Drug: Marijuana. He reports that he does not drink alcohol.  Medications  Current Outpatient Medications:  .  aspirin EC 81 MG tablet, Take 81 mg by mouth daily., Disp: , Rfl:  .  capecitabine (XELODA) 500 MG tablet,  1500 mg po bid Monday through Friday with radiation, Disp: 120 tablet, Rfl: 0 .  CHANTIX STARTING MONTH PAK 0.5 MG X 11 & 1 MG X 42 tablet, See admin instructions., Disp: , Rfl: 0 .  chlorproMAZINE (THORAZINE) 10 MG tablet, Take 1 tablet (10 mg total) by mouth 4 (four) times daily as needed for hiccoughs., Disp: 30 tablet, Rfl: 1 .  CISPLATIN IV, Inject into the vein., Disp: , Rfl:  .  feeding supplement, ENSURE ENLIVE, (ENSURE ENLIVE) LIQD, Take 237 mLs by mouth 2 (two) times daily between meals., Disp: 237 mL, Rfl: 12 .  lidocaine-prilocaine (EMLA) cream, Apply to affected area once, Disp: 30 g, Rfl: 3 .  methocarbamol (ROBAXIN) 500 MG tablet, Take 1 tablet (500 mg total) by mouth every 6 (six) hours as needed for muscle spasms., Disp: 20 tablet, Rfl: 1 .  Nutritional Supplements (FEEDING SUPPLEMENT, OSMOLITE 1.5 CAL,) LIQD, Place 1,000 mLs into feeding tube daily., Disp: 30 Bottle, Rfl: 0 .  ondansetron (ZOFRAN) 8 MG tablet, Take 1 tablet (8 mg total) by mouth 2 (two) times daily as needed. Start on the third day after chemotherapy., Disp: 30 tablet, Rfl: 1 .  oxyCODONE (OXY IR/ROXICODONE) 5 MG immediate release tablet, Take 1 tablet (5 mg total) by mouth every 6 (six) hours as needed., Disp: 90 tablet, Rfl: 0 .  prochlorperazine (COMPAZINE) 10 MG tablet, TAKE 1 TABLET BY MOUTH EVERY 6 HOURS AS NEEDED FOR NAUSEA OR VOMITING, Disp: 30 tablet, Rfl: 1  Allergies Potassium-containing compounds  Review of Systems Review of Systems - Oncology ROS negative other than occasional pain that responds to current pain regimen.     Physical Exam  Vitals Wt Readings from Last 3 Encounters:  08/15/18 172 lb 12.8 oz (78.4 kg)  07/24/18 169 lb 1.6 oz (76.7 kg)  07/13/18 173 lb (78.5 kg)   Temp Readings from Last 3 Encounters:  08/15/18 98.6 F (37 C) (Oral)  07/24/18 98.4 F (36.9 C) (Oral)  07/13/18 97.9 F (36.6 C) (Oral)   BP Readings from Last 3 Encounters:  08/15/18 133/88  07/24/18 (!)  132/92  07/13/18 (!) 137/92   Pulse Readings from Last 3 Encounters:  08/15/18 (!) 104  07/24/18 87  07/13/18 92   Constitutional: Well-developed, well-nourished, and in no distress.   HENT: Head: Normocephalic and atraumatic.  Mouth/Throat: No oropharyngeal exudate. Mucosa moist. Eyes: Pupils are equal, round, and reactive to light. Conjunctivae are normal. No scleral icterus.  Neck: Normal range of motion. Neck supple. No JVD present.  Cardiovascular: Normal rate, regular rhythm and normal heart sounds.  Exam reveals no gallop and no friction rub.   No murmur heard. Pulmonary/Chest: Effort normal and breath sounds normal. No respiratory distress. No wheezes.No rales.  Abdominal: Soft. Bowel sounds are normal. No distension. There is no tenderness. There is no guarding.  Feeding tube in place.   Musculoskeletal: No edema or tenderness.  Lymphadenopathy: No cervical, axillary or supraclavicular adenopathy.  Neurological: Alert and oriented to person, place, and time. No cranial nerve deficit.  Skin: Skin is warm and dry. No rash noted. No erythema. No pallor.  Psychiatric: Affect and judgment normal.   Labs Appointment on 08/15/2018  Component Date Value Ref  Range Status  . WBC 08/15/2018 5.7  4.0 - 10.5 K/uL Final  . RBC 08/15/2018 3.79* 4.22 - 5.81 MIL/uL Final  . Hemoglobin 08/15/2018 9.7* 13.0 - 17.0 g/dL Final  . HCT 08/15/2018 32.0* 39.0 - 52.0 % Final  . MCV 08/15/2018 84.4  80.0 - 100.0 fL Final  . MCH 08/15/2018 25.6* 26.0 - 34.0 pg Final  . MCHC 08/15/2018 30.3  30.0 - 36.0 g/dL Final  . RDW 08/15/2018 27.9* 11.5 - 15.5 % Final  . Platelets 08/15/2018 267  150 - 400 K/uL Final  . nRBC 08/15/2018 0.0  0.0 - 0.2 % Final  . Neutrophils Relative % 08/15/2018 66  % Final  . Neutro Abs 08/15/2018 3.8  1.7 - 7.7 K/uL Final  . Lymphocytes Relative 08/15/2018 19  % Final  . Lymphs Abs 08/15/2018 1.1  0.7 - 4.0 K/uL Final  . Monocytes Relative 08/15/2018 12  % Final  .  Monocytes Absolute 08/15/2018 0.7  0.1 - 1.0 K/uL Final  . Eosinophils Relative 08/15/2018 2  % Final  . Eosinophils Absolute 08/15/2018 0.1  0.0 - 0.5 K/uL Final  . Basophils Relative 08/15/2018 0  % Final  . Basophils Absolute 08/15/2018 0.0  0.0 - 0.1 K/uL Final  . RBC Morphology 08/15/2018 ANISOCYTOSIS   Final  . Immature Granulocytes 08/15/2018 1  % Final  . Abs Immature Granulocytes 08/15/2018 0.07  0.00 - 0.07 K/uL Final   Performed at [facility], [address], [place], [place] [address]  . Sodium 08/15/2018 136  135 - 145 mmol/L Final  . Potassium 08/15/2018 4.8  3.5 - 5.1 mmol/L Final  . Chloride 08/15/2018 102  98 - 111 mmol/L Final  . CO2 08/15/2018 27  22 - 32 mmol/L Final  . Glucose, Bld 08/15/2018 101* 70 - 99 mg/dL Final  . BUN 08/15/2018 23* 6 - 20 mg/dL Final  . Creatinine, Ser 08/15/2018 0.84  0.61 - 1.24 mg/dL Final  . Calcium 08/15/2018 9.5  8.9 - 10.3 mg/dL Final  . Total Protein 08/15/2018 7.1  6.5 - 8.1 g/dL Final  . Albumin 08/15/2018 3.4* 3.5 - 5.0 g/dL Final  . AST 08/15/2018 17  15 - 41 U/L Final  . ALT 08/15/2018 12  0 - 44 U/L Final  . Alkaline Phosphatase 08/15/2018 74  38 - 126 U/L Final  . Total Bilirubin 08/15/2018 0.7  0.3 - 1.2 mg/dL Final  . GFR calc non Af Amer 08/15/2018 >60  >60 mL/min Final  . GFR calc Af Amer 08/15/2018 >60  >60 mL/min Final   Comment: (NOTE) The eGFR has been calculated using the CKD EPI equation. This calculation has not been validated in all clinical situations. eGFR's persistently <60 mL/min signify possible Chronic Kidney Disease.   Georgiann Hahn gap 08/15/2018 7  5 - 15 Final   Performed at [facility], [address], [place], [place] [address]     Pathology Orders Placed This Encounter  Procedures  . CBC with Differential/Platelet    Standing Status:   Future    Standing Expiration Date:   08/16/2019  . Comprehensive metabolic panel    Standing Status:   Future    Standing Expiration Date:   08/16/2019  .  Lactate dehydrogenase    Standing Status:   Future    Standing Expiration Date:   08/16/2019       Zoila Shutter MD

## 2018-08-15 NOTE — Progress Notes (Addendum)
Nutrition Follow-up  57 y/o male PMHx CAD, CKD2, PVD, CVA, Tobacco abuse, HTN/HLD. Developed acute epigastric pain w/ n/v and globus sensation Dec 2018. EGD would later show GEJ ulcers, s/p biopsy + for gastric carcinoma. Underwent 4 cycles of FLOT chemo regimen. Pt initially decided to forego recommended surg intervention. Later changed mind, s/p Roux-en-y esophagojejunostomy w/ total gastrectomy and jejunostomy feeding tube on 8/8. Began adjuvant chemotherapy on 9/24 & adjuvant radiation on 11/5   Following up w/ pt s/p completion of IV chemo. Began radiation 2 weeks ago.   When seen 2 wks ago, TF was increased d/t wt loss. He was instructed to follow following regimen: Osmolite 1.5 @75  x12-15 hrs w/ 60 ml flush before and after +30 ml promod q 24 hrs w/ 60 ml flush before and after. (goal  5 cans q day) The length of feeding is given as range because pt has said his feeding time depends on if he has "stuff to do" outside of the house.    Depending on if he runs the TF for 12 or 15 hrs, TF will provide him: 1450-1790 kcals, 66g-81g Pro and 926 ml-1097 fluid. He was to meet his remaining kcal/nutrition needs w/ 1 ensure/d and his oral intake.   Today, pt states he has been compliant with above TF regimen. He typically runs his TF 5 pm -> 7 am (14 hrs), though he will run it 8pm->8 am (12 hrs) when he needs to get work done on his truck. He is still infusing 30 promod each day as well as 60 ml flushes before and after his feeding.   He notes that he has begun experiencing the side effects of radiation. He says he experiences coughing when drinking now. He says the Rad Onc MD told him to "drink slowly", but he has had only varying levels of success with this. When asked how often these instances occur, he says "all the time". He also has odynophagia and experiences pain with nearly all intake, including water. However, he has no pain when drinking sweet tea, Ensure or Boost.   He had reported diarrhea at  11/5 visit. He had not been taking imodium though as had been instructed. Today, he says he started taking imodium and his bowels are much better controlled. They are loose, but not watery.   His diet profile sounds to have changed to more of a soft diet. He notes eating items such as soup and chicken alfredo. He is still consuming various quantities of Ensure/Boost   Today, his wt is 172.8 lbs, showing his TF change has had benefit.   He asks me to talk with "Dr Bunnie Philips?) Regarding his TF regimen, as he says they had asked why he was infusing 5 cans of TF each day. He says their office is supposedly managing the TF.   Wt Readings from Last 10 Encounters:  08/15/18 172 lb 12.8 oz (78.4 kg)  07/24/18 169 lb 1.6 oz (76.7 kg)  07/13/18 173 lb (78.5 kg)  07/11/18 174 lb 3.2 oz (79 kg)  07/05/18 173 lb 8 oz (78.7 kg)  07/05/18 178 lb 9.2 oz (81 kg)  07/02/18 178 lb 9.2 oz (81 kg)  06/20/18 177 lb 9.6 oz (80.6 kg)  06/15/18 172 lb (78 kg)  06/14/18 172 lb (78 kg)   MEDICATIONS:  Chemotherapy: Xeloda Other: Thorazine, Ensure Enlive, Zofran, Compazine, Oxycodone, Robaxin  LABS: Albumin: 3.4, up from 3.2 (11/12),  Renal labs stable  Recent Labs  Lab 08/15/18 1312  NA 136  K 4.8  CL 102  CO2 27  BUN 23*  CREATININE 0.84  CALCIUM 9.5  GLUCOSE 101*     ANTHROPOMETRICS: Height:  Ht Readings from Last 1 Encounters:  07/13/18 6' 1"  (1.854 m)   Weight:  Wt Readings from Last 1 Encounters:  08/15/18 172 lb 12.8 oz (78.4 kg)   BMI:  BMI Readings from Last 1 Encounters:  08/15/18 22.80 kg/m   UBW: 200-205 lbs IBW: 184 lbs or 83.64 kg  Wt change: +2.5 lbs x 2.5 weeks - ~9.5 lbs x 4 months  - 18 lbs x 6 months  - 27 lbs x 1 yr (199 Nov 2018)   ESTIMATED ENERGY NEEDS:  Kcal: 2350-2500 (30-32 kcal/kg bw) Protein:100-118g Pro (1.3-1.5g/kg bw) Fluid: >2.3 L fluid (30 ml/kg bw)  NUTRITION DIAGNOSIS:  Increased protein/kcal needs related to cancer and cancer related  treatments as evidenced by the estimated nutritional requirements for this condition  Ongoing  DOCUMENTATION CODES:  Not applicable  INTERVENTION:   Patient sounds to be doing well with the tube feeding regimen. He has no s/s of intolerance.   However, with oral intake, he sounds like he is starting to have some radiation related dysphagia and odynophagia. RD advised patient to take his time eating and explained how he is risking aspiration PNA by eating so quickly.  As of yet, it does not sound his odynophagia has limited his intake, rather it has just caused him to change the foods he eats. He says he does not experience any odynophagia with Ensure or Boost. He says he drinks these well. RD provided patient with his 8th case of Ensure as well as a myriad of various samples.  RD discussed patients purported dysphagia and odynophagia with MD.    RD will reach out to [facility] to determine who had written the original TF order. This was also discussed with MD.   Will continue to monitor weight closely, as this is really the only feedback tool that RD has available given his unreliable oral reporting    RD provided him his 8th case of Ensure.  GOAL:   Oral intake to meet >90% of estimate needs  Wt stability vs gain  Patient will demonstrate compliance w/ TF  Not met  MONITOR:  Weight, TF tolerance, oral diet tolerance, tolerance to chemotherapy/radiation  Ongoing  Next Visit: 12/3   [name] RD, LDN, CNSC Clinical Nutrition Available Tues-Sat via Pager: [phone] 08/15/2018 2:47 PM

## 2018-08-15 NOTE — Patient Instructions (Signed)
[facility]  You saw Dr. Vetta Higgs today _______________________________________________________________  Thank you for choosing [facility] at [facility] to provide your oncology and hematology care.  To afford each patient quality time with our providers, please arrive at least 15 minutes before your scheduled appointment.  You need to re-schedule your appointment if you arrive 10 or more minutes late.  We strive to give you quality time with our providers, and arriving late affects you and other patients whose appointments are after yours.  Also, if you no show three or more times for appointments you may be dismissed from the clinic.  Again, thank you for choosing [facility] at [facility]. Our hope is that these requests will allow you access to exceptional care and in a timely manner. _______________________________________________________________  If you have questions after your visit, please contact our office at [phone] between the hours of 8:30 a.m. and 5:00 p.m. Voicemails left after 4:30 p.m. will not be returned until the following business day. _______________________________________________________________  For prescription refill requests, have your pharmacy contact our office. _______________________________________________________________  Recommendations made by the consultant and any test results will be sent to your referring physician. _______________________________________________________________ 

## 2018-08-16 DIAGNOSIS — C169 Malignant neoplasm of stomach, unspecified: Secondary | ICD-10-CM | POA: Diagnosis not present

## 2018-08-16 DIAGNOSIS — C160 Malignant neoplasm of cardia: Secondary | ICD-10-CM | POA: Diagnosis not present

## 2018-08-16 DIAGNOSIS — Z510 Encounter for antineoplastic radiation therapy: Secondary | ICD-10-CM | POA: Diagnosis not present

## 2018-08-17 DIAGNOSIS — Z510 Encounter for antineoplastic radiation therapy: Secondary | ICD-10-CM | POA: Diagnosis not present

## 2018-08-17 DIAGNOSIS — C169 Malignant neoplasm of stomach, unspecified: Secondary | ICD-10-CM | POA: Diagnosis not present

## 2018-08-18 ENCOUNTER — Other Ambulatory Visit (HOSPITAL_COMMUNITY): Payer: Self-pay | Admitting: Internal Medicine

## 2018-08-18 DIAGNOSIS — Z510 Encounter for antineoplastic radiation therapy: Secondary | ICD-10-CM | POA: Diagnosis not present

## 2018-08-18 DIAGNOSIS — C169 Malignant neoplasm of stomach, unspecified: Secondary | ICD-10-CM | POA: Diagnosis not present

## 2018-08-18 MED FILL — CAPECITABINE 500 MG TABS: 500 | 28 days supply | Qty: 120 | Fill #0

## 2018-08-21 DIAGNOSIS — C169 Malignant neoplasm of stomach, unspecified: Secondary | ICD-10-CM | POA: Diagnosis not present

## 2018-08-21 DIAGNOSIS — Z510 Encounter for antineoplastic radiation therapy: Secondary | ICD-10-CM | POA: Diagnosis not present

## 2018-08-22 DIAGNOSIS — C169 Malignant neoplasm of stomach, unspecified: Secondary | ICD-10-CM | POA: Diagnosis not present

## 2018-08-22 DIAGNOSIS — C163 Malignant neoplasm of pyloric antrum: Secondary | ICD-10-CM | POA: Diagnosis not present

## 2018-08-22 DIAGNOSIS — Z510 Encounter for antineoplastic radiation therapy: Secondary | ICD-10-CM | POA: Diagnosis not present

## 2018-08-23 DIAGNOSIS — C169 Malignant neoplasm of stomach, unspecified: Secondary | ICD-10-CM | POA: Diagnosis not present

## 2018-08-23 DIAGNOSIS — Z510 Encounter for antineoplastic radiation therapy: Secondary | ICD-10-CM | POA: Diagnosis not present

## 2018-08-26 IMAGING — CT CT ABD-PELV W/ CM
2 of 5 series · 16 of 46 positions shown, 18 images · IV contrast (Isovue)
Comparison: 03/18/2018

CLINICAL DATA: Gastric cancer with abdominal pain

EXAM:
CT ABDOMEN AND PELVIS WITH CONTRAST
TECHNIQUE: Multidetector CT imaging of the abdomen and pelvis was performed
using the standard protocol following bolus administration of
intravenous contrast.
CONTRAST:  100mL I23FO5-VDD IOPAMIDOL (I23FO5-VDD) INJECTION 61%

[Series 2: axial st · axial · 0.70mm/px · z∈[+907,+1297]mm · 13 of 90 slices shown, 15 images]
[im 6/90  soft-tissue]
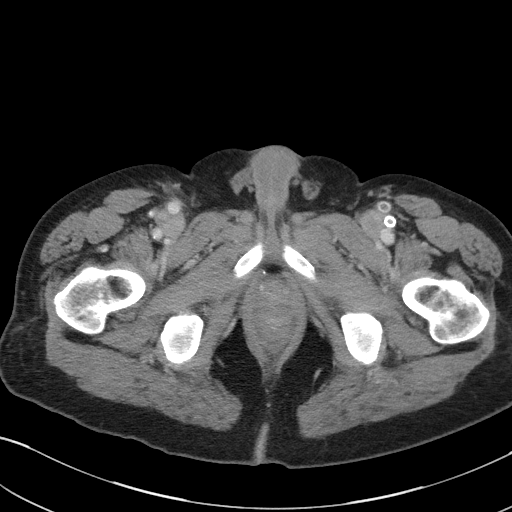
[im 6/90  bone]
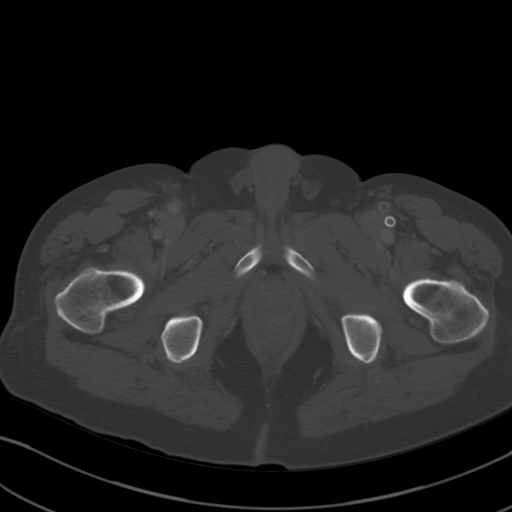
[im 11/90  soft-tissue]
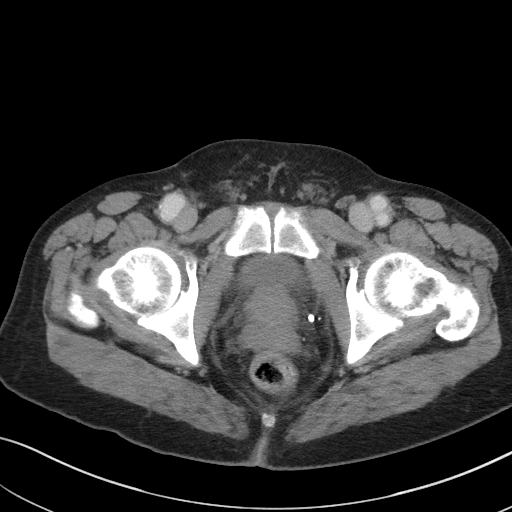
[im 21/90  soft-tissue]
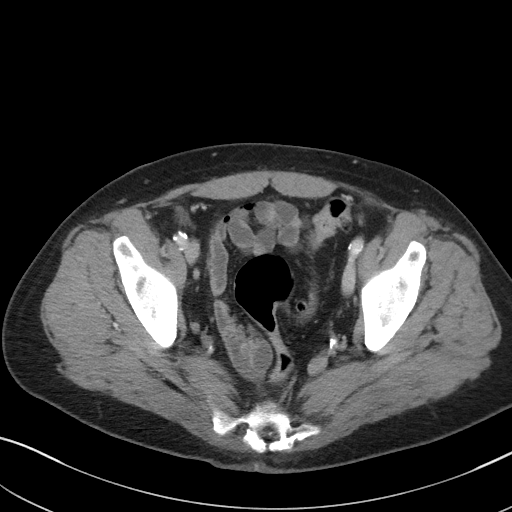
[im 27/90  soft-tissue]
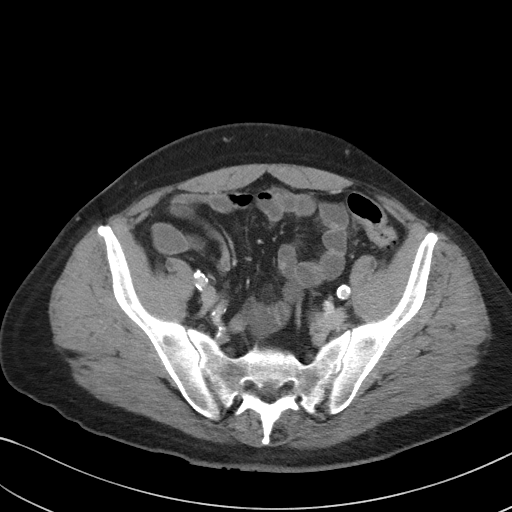
[im 32/90  soft-tissue]
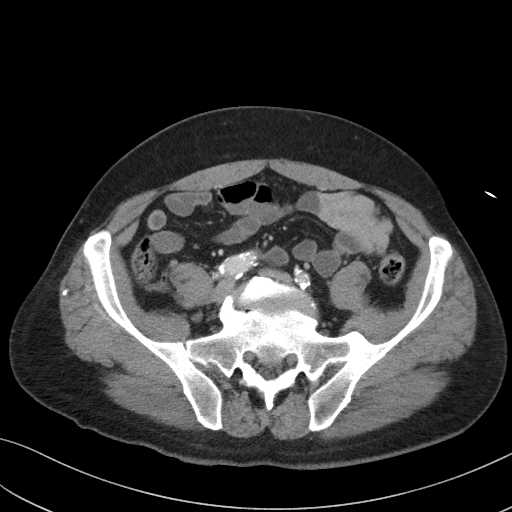
[im 37/90  soft-tissue]
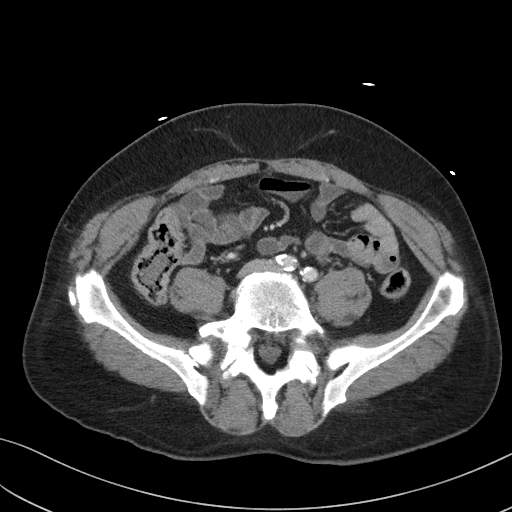
[im 48/90  soft-tissue]
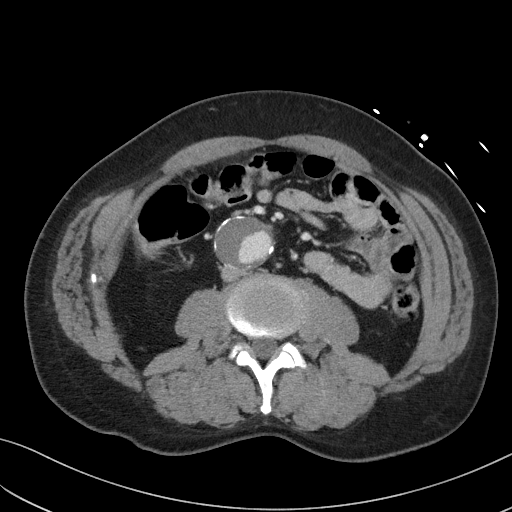
[im 53/90  soft-tissue]
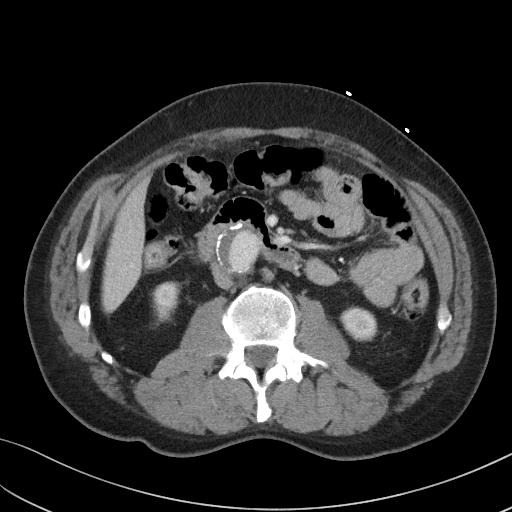
[im 58/90  soft-tissue]
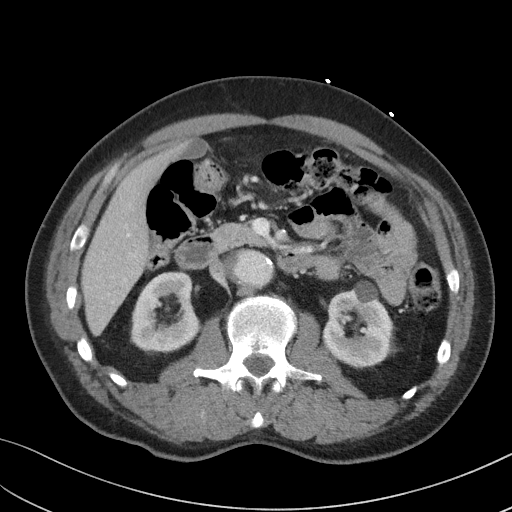
[im 58/90  bone]
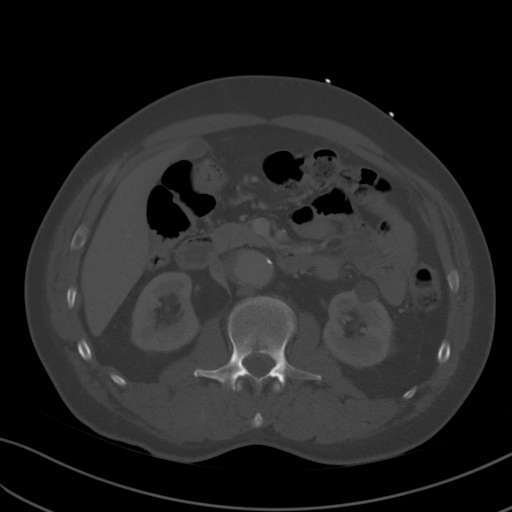
[im 63/90  soft-tissue]
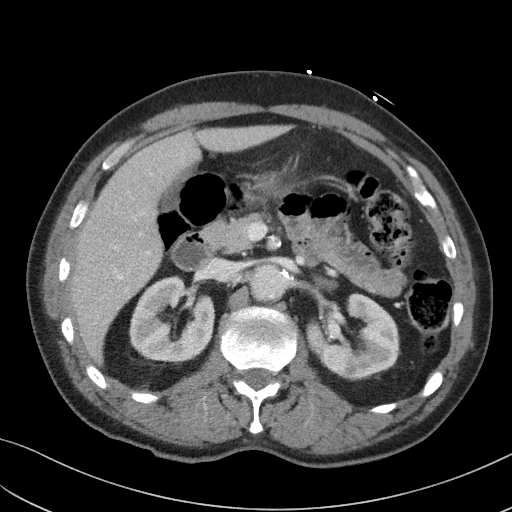
[im 69/90  soft-tissue]
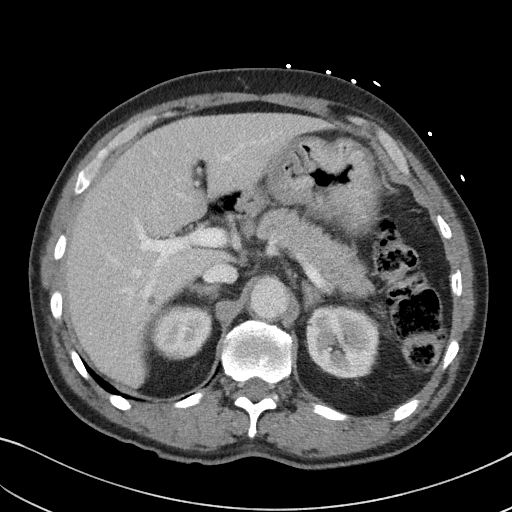
[im 79/90  soft-tissue]
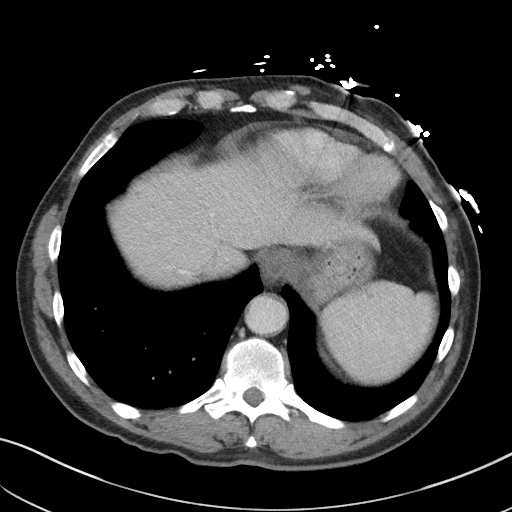
[im 84/90  soft-tissue]
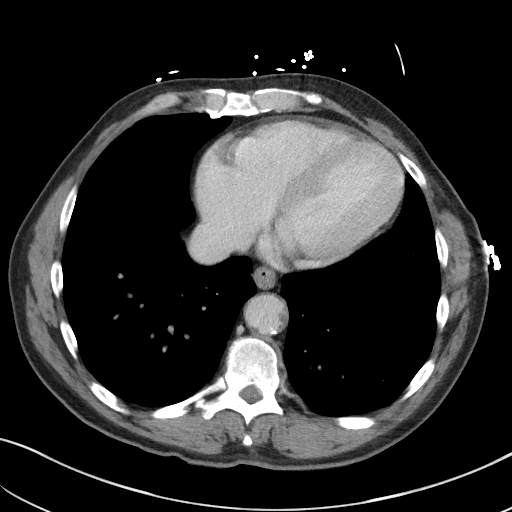

[Series 4: coronal st · coronal · 0.69mm/px · 3 of 96 slices shown]
[im 32/96  soft-tissue]
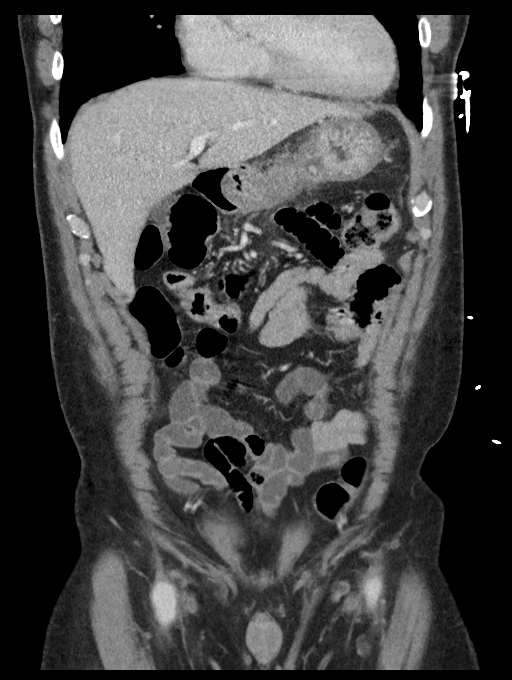
[im 43/96  soft-tissue]
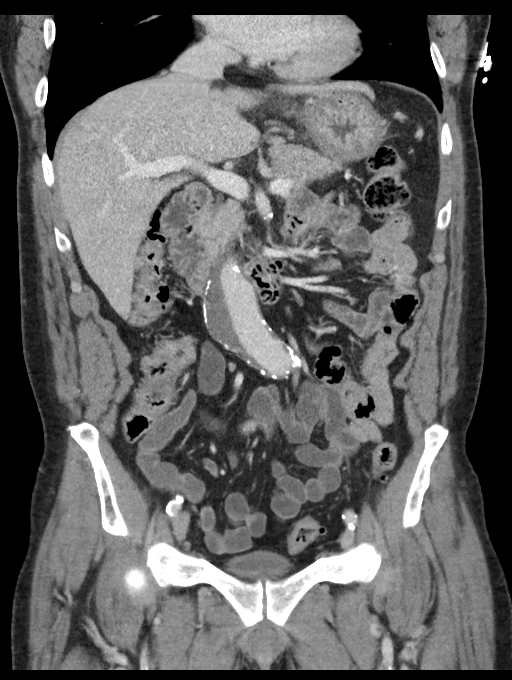
[im 53/96  soft-tissue]
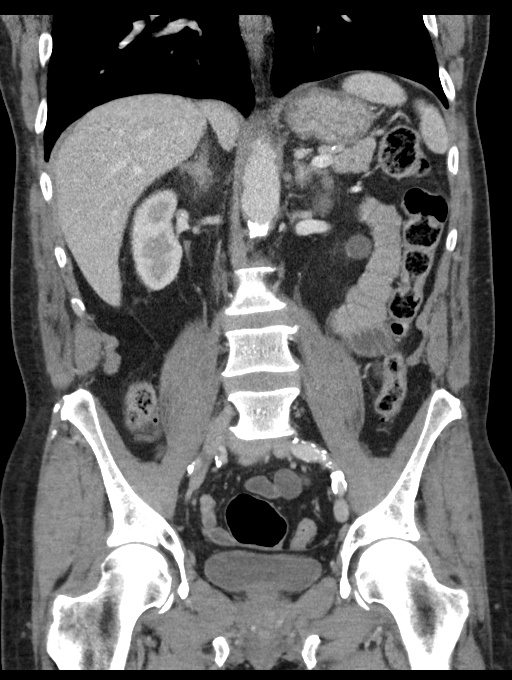

[16 of 46 positions shown; findings below may reference images not displayed]

FINDINGS: Lower chest:  No acute or nodular finding.  Mild emphysema.

Hepatobiliary: Small cystic density in the right liver seen since
5815.no evidence of biliary obstruction or stone.

Pancreas: Unremarkable.

Spleen: Unremarkable.

Adrenals/Urinary Tract: Negative adrenals. No hydronephrosis or
stone. Bilateral renal cortical cystic densities. Unremarkable
bladder.

Stomach/Bowel: Known gastric carcinoma with recent PET CT.
Redemonstrated ulcer along the posterior wall. No perforation noted.
No bowel obstruction or appendicitis. Colonic diverticulosis.

Vascular/Lymphatic: Advanced atherosclerosis. Fusiform abdominal
aortic aneurysm measuring 3.7 cm on coronal reformats. Occlusion of
left SFA and profunda. Occlusion of the stented right common femoral
branch.

Reproductive:Negative.

Other: No ascites or pneumoperitoneum.

Musculoskeletal: L5 chronic bilateral pars defects with
anterolisthesis and foraminal narrowing.
IMPRESSION: 1. No acute finding.  Stable from 03/18/2018.
2. History of gastric adenocarcinoma with non perforated ulcer.
3. Extensive atherosclerosis with infrarenal aortic aneurysm.
Recommend followup by ultrasound in 2 years. This recommendation
follows ACR consensus guidelines: White Paper of the ACR Incidental

## 2018-08-27 ENCOUNTER — Emergency Department (HOSPITAL_COMMUNITY)
Admission: EM | Admit: 2018-08-27 | Discharge: 2018-08-27 | Disposition: A | Discharge status: Home / Self Care | Payer: Medicare Other | Attending: Emergency Medicine | Admitting: Emergency Medicine

## 2018-08-27 ENCOUNTER — Other Ambulatory Visit: Payer: Self-pay

## 2018-08-27 ENCOUNTER — Encounter (HOSPITAL_COMMUNITY): Payer: Self-pay | Admitting: Emergency Medicine

## 2018-08-27 DIAGNOSIS — F17200 Nicotine dependence, unspecified, uncomplicated: Secondary | ICD-10-CM | POA: Diagnosis not present

## 2018-08-27 DIAGNOSIS — T85528A Displacement of other gastrointestinal prosthetic devices, implants and grafts, initial encounter: Secondary | ICD-10-CM

## 2018-08-27 DIAGNOSIS — K9423 Gastrostomy malfunction: Secondary | ICD-10-CM | POA: Diagnosis not present

## 2018-08-27 DIAGNOSIS — Z79899 Other long term (current) drug therapy: Secondary | ICD-10-CM | POA: Diagnosis not present

## 2018-08-27 DIAGNOSIS — K9429 Other complications of gastrostomy: Secondary | ICD-10-CM | POA: Diagnosis not present

## 2018-08-27 NOTE — ED Notes (Signed)
EDP in room assessing j-tube. 5 sutures noted around tubing, all sutures no longer intact. Purulent drainage noted. Area aroun j-tube cleaned with wound cleanser. Patient tolerated well. Foley anchor and foam tape to be used to secure tube.

## 2018-08-27 NOTE — ED Provider Notes (Signed)
[facility] EMERGENCY DEPARTMENT Provider Note   CSN: [id number] Arrival date & time: 08/27/18  9702     History   Chief Complaint Chief Complaint  Patient presents with  . Other    J-tube sutures removed     HPI Todd [name] is a 57 y.o. male.  HPI   Patient presents for evaluation of his J-tube, he is concerned that the stitches have fallen out.  This is happened recurrently since the tube was placed in August 2019.  He has been seen in the ED at least 3 times and had sutures replaced.  He had a J-tube placed after he had a partial gastrectomy to assist with his nutritional input.  He is eating some and using the tube some for nutrition.  He has not followed up with his surgeon since that time.  He continues to get treatment for his cancer including radiation therapy.  He denies fever, chills, nausea or vomiting.  There are no other known modifying factors.   Past Medical History:  Diagnosis Date  . CAD (coronary artery disease)    a. Cath 07/2012 following abnormal nuclear stress test: 20-30% LAD, 20-30% OM1, occluded branch off OM2 that fills slowly via collaterals, small nondominant RCA occluded proximally, fills slowly via collaterals.  . CKD (chronic kidney disease), stage II   . Family history of cancer   . Gastric adenocarcinoma ([place])   . History of blood transfusion    "when I was a child"  . Hyperlipidemia   . Hypertension   . Peripheral vascular disease ([place])    a. bilateral superficial femoral artery stents in May/August of 2012. b. Left femoral to above-knee popliteal bypass in 08/2012.  Marland Kitchen Plantar fasciitis   . Renal insufficiency   . Stroke [facility])    2008 right side weakness  . Tobacco abuse     Patient Active Problem List   Diagnosis Date Noted  . Elevated lipase 03/17/2018  . Hypotension 03/17/2018  . Leukocytosis 03/17/2018  . Gastric carcinoma ([place]) 01/23/2018  . Genetic testing 12/20/2017  . Family history of cancer   . IBS (irritable bowel  syndrome) 11/28/2017  . Malignant neoplasm of stomach ([place]) 11/11/2017  . Constipation 08/30/2017  . GERD (gastroesophageal reflux disease) 08/30/2017  . Globus sensation 08/30/2017  . Cerebral artery occlusion with cerebral infarction ([place]) 12/20/2016  . PAD (peripheral artery disease) ([place]) 08/09/2014  . CAD (coronary artery disease) 07/22/2014  . Tobacco abuse 07/22/2014  . 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 Pure hypercholesterolemia 05/28/2014  . Hypertension 07/19/2012  . Hyperlipidemia 07/19/2012  . PVD (peripheral vascular disease) with claudication ([place]) 06/29/2012  . Atherosclerosis of native arteries of the extremities with intermittent claudication 04/20/2012    Past Surgical History:  Procedure Laterality Date  . ABDOMINAL AORTAGRAM N/A 04/27/2012   Procedure: ABDOMINAL Maxcine Ham;  Surgeon: Conrad Brewster, MD;  Location: [facility] CATH LAB;  Service: Cardiovascular;  Laterality: N/A;  . ABDOMINAL AORTAGRAM N/A 08/09/2014   Procedure: ABDOMINAL Maxcine Ham;  Surgeon: Elam Dutch, MD;  Location: [facility] CATH LAB;  Service: Cardiovascular;  Laterality: N/A;  . BIOPSY  10/06/2017   Procedure: BIOPSY;  Surgeon: Daneil Dolin, MD;  Location: [facility] ENDO SUITE;  Service: Endoscopy;;  gastric   . CARDIAC CATHETERIZATION    . COLONOSCOPY    . COLONOSCOPY WITH PROPOFOL N/A 10/06/2017   Procedure: COLONOSCOPY WITH PROPOFOL;  Surgeon: Daneil Dolin, MD;  [facility]: [facility] ENDO SU[facility];  Service: Endoscopy;  Laterality:  N/A;  1:45pm - patient can't come earlier due to transportation  . ESOPHAGOGASTRODUODENOSCOPY (EGD) WITH PROPOFOL N/A 10/06/2017   Procedure: ESOPHAGOGASTRODUODENOSCOPY (EGD) WITH PROPOFOL;  Surgeon: Daneil Dolin, MD;  [facility]: [facility] ENDO SU[facility];  Service: Endoscopy;  Laterality: N/A;  . EUS N/A 11/24/2017   Procedure: UPPER ENDOSCOPIC ULTRASOUND (EUS) RADIAL;  Surgeon: Milus Banister, MD;  Location: [facility] ENDOSCOPY;  Service: Endoscopy;  Laterality: N/A;  . FEMORAL ARTERY -  POPLITEAL ARTERY BYPASS GRAFT    . FEMORAL-POPLITEAL BYPASS GRAFT  09/26/2012   Procedure: BYPASS GRAFT FEMORAL-POPLITEAL ARTERY;  Surgeon: Elam Dutch, MD;  Location[facility] OR;  Service: Vascular;  Laterality: Left;  using 31mm Gore-Tex Propaten Ringed Graft, Vein patch on the proximal and distal anastomosis  . FEMORAL-POPLITEAL BYPASS GRAFT Right 08/12/2014   Procedure:  FEMORAL-POPLITEAL ARTERY BYPASS WITH SAPHENOUS VEIN GRAFT, INTRAOPERATIVE ARTERIOGRAM;  Surgeon: Elam Dutch, MD;  Location: [place];  Service: Vascular;  Laterality: Right;  . GASTRECTOMY N/A 05/04/2018   Procedure: Total Gastrectomy with Feeding Jejunostomy;  Surgeon: [name], MD;  Location: [facility];  Service: General;  Laterality: N/A;  . INTRAOPERATIVE ARTERIOGRAM  08/12/2014   Procedure: INTRA OPERATIVE ARTERIOGRAM;  Surgeon: Elam Dutch, MD;  Location: [place];  Service: Vascular;;  . LAPAROSCOPY N/A 05/04/2018   Procedure: LAPAROSCOPY DIAGNOSTIC ERAS PATHWAY;  Surgeon: [name], MD;  Location: [place];  Service: General;  Laterality: N/A;  EPIDURAL  . POLYPECTOMY  10/06/2017   Procedure: POLYPECTOMY;  Surgeon: Daneil Dolin, MD;  [facility];  Service: Endoscopy;;  splenic flexure,  ascending  . PORTACATH PLACEMENT N/A 01/24/2018   Procedure: INSERTION PORT-A-CATH;  Surgeon: [name], MD;  Location: [place];  Service: General;  Laterality: N/A;  . PR VEIN BYPASS GRAFT,AORTO-FEM-POP    . stents in bil legs          Home Medications    Prior to Admission medications   Medication Sig Start Date End Date Taking? Authorizing Provider  aspirin EC 81 MG tablet Take 81 mg by mouth daily.    [provider]  capecitabine (XELODA) 500 MG tablet TAKE 3 TABLETS BY MOUTH TWICE A DAY MONDAY THROUGH FRIDAY WITH RADIATION 08/18/18   Higgs, Mathis Dad, MD  CHANTIX STARTING MONTH PAK 0.5 MG X 11 & 1 MG X 42 tablet See admin instructions. 08/10/18   [provider]  chlorproMAZINE (THORAZINE)  10 MG tablet Take 1 tablet (10 mg total) by mouth 4 (four) times daily as needed for hiccoughs. 07/24/18   Higgs, Mathis Dad, MD  CISPLATIN IV Inject into the vein.    [provider]  feeding supplement, ENSURE ENLIVE, (ENSURE ENLIVE) LIQD Take 237 mLs by mouth 2 (two) times daily between meals. 05/16/18   [name], MD  lidocaine-prilocaine (EMLA) cream Apply to affected area once 06/19/18   Higgs, Mathis Dad, MD  methocarbamol (ROBAXIN) 500 MG tablet Take 1 tablet (500 mg total) by mouth every 6 (six) hours as needed for muscle spasms. 05/16/18   [name], MD  Nutritional Supplements (FEEDING SUPPLEMENT, OSMOLITE 1.5 CAL,) LIQD Place 1,000 mLs into feeding tube daily. 05/16/18   [name], MD  ondansetron (ZOFRAN) 8 MG tablet Take 1 tablet (8 mg total) by mouth 2 (two) times daily as needed. Start on the third day after chemotherapy. 06/19/18   Higgs, Mathis Dad, MD  oxyCODONE (OXY IR/ROXICODONE) 5 MG immediate release tablet Take 1 tablet (5 mg total) by mouth every 6 (six) hours as needed.  08/15/18   Higgs, Mathis Dad, MD  prochlorperazine (COMPAZINE) 10 MG tablet TAKE 1 TABLET BY MOUTH EVERY 6 HOURS AS NEEDED FOR NAUSEA OR VOMITING 08/11/18   Higgs, Mathis Dad, MD    Family History Family History  Problem Relation Age of Onset  . Diabetes Mother   . Heart disease Mother        before age 22- Triple BPG  . Cancer Mother        Luekemia  . Deep vein thrombosis Mother   . Hyperlipidemia Mother   . Hypertension Mother   . Heart attack Mother   . Peripheral vascular disease Mother   . Stroke Mother        x 3  . Pancreatic cancer Mother   . Arthritis Father   . Hypertension Father   . Kidney disease Father   . Cancer Maternal Grandmother        NOS  . Diabetes Cousin   . Cancer Cousin        NOS, pat first cousin  . Cancer Cousin        NOS, pat first cousin  . Colon cancer Neg Hx   . Gastric cancer Neg Hx   . Esophageal cancer Neg Hx     Social History Social History   Tobacco  Use  . Smoking status: Current Every Day Smoker    Packs/day: 1.00    Years: 25.00    Pack years: 25.00    Types: Cigars    Start date: 11/16/1974  . Smokeless tobacco: Never Used  . Tobacco comment: Black and Mild cigars  Substance Use Topics  . Alcohol use: No    Alcohol/week: 0.0 standard drinks  . Drug use: Yes    Types: Marijuana    Comment: last use 05/03/18     Allergies   Potassium-containing compounds   Review of Systems Review of Systems  All other systems reviewed and are negative.    Physical Exam Updated Vital Signs There were no vitals taken for this visit.  Physical Exam  Constitutional: He is oriented to person, place, and time. He appears well-developed and well-nourished. No distress.  HENT:  Head: Normocephalic and atraumatic.  Right Ear: External ear normal.  Left Ear: External ear normal.  Eyes: Pupils are equal, round, and reactive to light. Conjunctivae and EOM are normal.  Neck: Normal range of motion and phonation normal. Neck supple.  Cardiovascular: Normal rate.  Pulmonary/Chest: Effort normal. He exhibits no bony tenderness.  Abdominal: Soft. He exhibits no distension and no mass. There is no tenderness. There is no guarding.  On arrival there is a J-tube in the left midabdomen, covered with tape, which the patient states was placed when he was in the ED, on 07/13/2018.  There are sutures on the hub of the J-tube, but the tip is not attached to the skin.  Beneath the hub of the J-tube there is indistinct exudate, without bleeding or erythema associated with the ostomy.  Musculoskeletal: Normal range of motion.  Neurological: He is alert and oriented to person, place, and time. No cranial nerve deficit or sensory deficit. He exhibits normal muscle tone. Coordination normal.  Skin: Skin is warm, dry and intact.  Psychiatric: He has a normal mood and affect. His behavior is normal. Judgment and thought content normal.  Nursing note and vitals  reviewed.    ED Treatments / Results  Labs (all labs ordered are listed, but only abnormal results are displayed) Labs Reviewed - No data  to display  EKG None  Radiology No results found.  Procedures Procedures (including critical care time)  Medications Ordered in ED Medications - No data to display   Initial Impression / Assessment and Plan / ED Course  I have reviewed the triage vital signs and the nursing notes.  Pertinent labs & imaging results that were available during my care of the patient were reviewed by me and considered in my medical decision making (see chart for details).  Clinical Course as of Aug 27 906  Sun Aug 27, 2018  0903 Wound care abdominal wall, the area beneath the J-tube pump was carefully cleansed, by nursing.  A nonadherent dressing was placed over the top of it, while investigation and preparations were made for definitive management.  When he arrived back into the room to secure the tube, with a tube holder.  At this time the patient states "the tube fell out."  He states he removed the dressing and the tube came out with it.  The ostomy appears patent, without drainage or bleeding.  The J-tube was cleansed with sterile saline by me.  I attempted to reinsert the J-tube, but the tract could not be cannulated, with gentle pressure, several attempts.  There was a small amount of bleeding, noted, so efforts were discontinued.  Following this attempt at replacement, there was no change in his abdominal exam.  The ostomy was covered with sterile gauze.  Findings and plan were discussed with the patient at this time.   [EW]    Clinical Course User Index [EW] Daleen Bo, MD     Patient Vitals for the past 24 hrs:  Temp Temp src Pulse Resp SpO2 Height Weight  08/27/18 0742 - - - - - 6\' 1"  (1.854 m) 77.6 kg  08/27/18 0741 98.1 F (36.7 C) Oral 99 16 100 % - -    9:05 AM Reevaluation with update and discussion. After initial assessment and  treatment, an updated evaluation reveals he remains comfortable and has no further complaints.  Findings discussed and questions answered. Daleen Bo   Medical Decision Making: J-tube dislodgm replacement versusent, patient has been able to eat some.  The J-tube could not be replaced, in the ED setting.  There is no indication for hospitalization or urgent replacement of J-tube at this time.  Since the patient is eating he can likely be managed without tube feedings.  There is no indication for hospitalization at this time.  CRITICAL CARE-no Performed by: Daleen Bo   Nursing Notes Reviewed/ Care Coordinated Applicable Imaging Reviewed Interpretation of Laboratory Data incorporated into ED treatment  The patient appears reasonably screened and/or stabilized for discharge and I doubt any other medical condition or other [facility] requiring further screening, evaluation, or treatment in the ED at this time prior to discharge.  Plan: Home Medications-continue usual medications; Home Treatments-push oral fluids and nutrition.; return here if the recommended treatment, does not improve the symptoms; Recommended follow up-patient to contact his general surgeon tomorrow morning to arrange for follow-up appointment to discuss need for management of nutritional status without tube feedings.    Final Clinical Impressions(s) / ED Diagnoses   Final diagnoses:  None    ED Discharge Orders    None       Daleen Bo, MD 08/27/18 [id number]

## 2018-08-27 NOTE — ED Notes (Signed)
J-tube dislodged by patient accidentally while staff gathering supplies to apply around j-tube. EDP aware. J-tube placed in sterile saline per EDP's instructions.

## 2018-08-27 NOTE — ED Triage Notes (Signed)
Patient states 3 sutures from J-tube accidentally "popped during sleep" on Friday. Patient states that 1 suture is still intact and j-tube is still in place-patient reports using j-tube and flushing with no issues. Denies any drainage. Patient has ace bandage and tape to keep j-tube in place.

## 2018-08-27 NOTE — Discharge Instructions (Addendum)
Call Dr. Barry Dienes in the morning to discuss whether or not you need to have a another J-tube placed.

## 2018-08-27 NOTE — ED Notes (Signed)
EDP (Dr Eulis Foster) attempted x1 to reinsert  j-tube-unable to reinsert j-tube. Patient denied any pain during attempt. J-tube insertion site dressed with telfa, gauze, abd pad, and foam tape. Ace bandage applied to keep dressing in place. Patient tolerated well.

## 2018-08-28 ENCOUNTER — Other Ambulatory Visit (HOSPITAL_COMMUNITY): Payer: Self-pay | Admitting: Diagnostic Radiology

## 2018-08-28 DIAGNOSIS — E440 Moderate protein-calorie malnutrition: Secondary | ICD-10-CM | POA: Diagnosis not present

## 2018-08-28 DIAGNOSIS — C163 Malignant neoplasm of pyloric antrum: Secondary | ICD-10-CM | POA: Diagnosis not present

## 2018-08-28 DIAGNOSIS — T85528A Displacement of other gastrointestinal prosthetic devices, implants and grafts, initial encounter: Secondary | ICD-10-CM | POA: Diagnosis not present

## 2018-08-28 DIAGNOSIS — R633 Feeding difficulties, unspecified: Secondary | ICD-10-CM

## 2018-08-28 DIAGNOSIS — Z510 Encounter for antineoplastic radiation therapy: Secondary | ICD-10-CM | POA: Diagnosis not present

## 2018-08-28 DIAGNOSIS — C169 Malignant neoplasm of stomach, unspecified: Secondary | ICD-10-CM | POA: Diagnosis not present

## 2018-08-29 ENCOUNTER — Encounter (HOSPITAL_COMMUNITY): Payer: Self-pay | Admitting: Interventional Radiology

## 2018-08-29 ENCOUNTER — Other Ambulatory Visit: Payer: Self-pay

## 2018-08-29 ENCOUNTER — Inpatient Hospital Stay (HOSPITAL_BASED_OUTPATIENT_CLINIC_OR_DEPARTMENT_OTHER): Payer: Medicare Other | Admitting: Internal Medicine

## 2018-08-29 ENCOUNTER — Other Ambulatory Visit (HOSPITAL_COMMUNITY): Payer: Self-pay | Admitting: Internal Medicine

## 2018-08-29 ENCOUNTER — Inpatient Hospital Stay (HOSPITAL_COMMUNITY): Payer: Medicare Other | Attending: Internal Medicine

## 2018-08-29 ENCOUNTER — Encounter (HOSPITAL_COMMUNITY): Payer: Self-pay | Admitting: Dietician

## 2018-08-29 ENCOUNTER — Ambulatory Visit (HOSPITAL_COMMUNITY)
Admission: RE | Admit: 2018-08-29 | Discharge: 2018-08-29 | Disposition: A | Discharge status: Home / Self Care | Payer: Medicare Other | Source: Ambulatory Visit | Attending: Diagnostic Radiology | Admitting: Diagnostic Radiology

## 2018-08-29 VITALS — BP 118/96 | HR 114 | Temp 97.8°F | Resp 16 | Wt 167.5 lb

## 2018-08-29 DIAGNOSIS — Z7982 Long term (current) use of aspirin: Secondary | ICD-10-CM | POA: Insufficient documentation

## 2018-08-29 DIAGNOSIS — Z9119 Patient's noncompliance with other medical treatment and regimen: Secondary | ICD-10-CM

## 2018-08-29 DIAGNOSIS — Z434 Encounter for attention to other artificial openings of digestive tract: Secondary | ICD-10-CM | POA: Diagnosis not present

## 2018-08-29 DIAGNOSIS — Z4659 Encounter for fitting and adjustment of other gastrointestinal appliance and device: Secondary | ICD-10-CM | POA: Diagnosis not present

## 2018-08-29 DIAGNOSIS — F17290 Nicotine dependence, other tobacco product, uncomplicated: Secondary | ICD-10-CM | POA: Diagnosis not present

## 2018-08-29 DIAGNOSIS — Z8249 Family history of ischemic heart disease and other diseases of the circulatory system: Secondary | ICD-10-CM | POA: Insufficient documentation

## 2018-08-29 DIAGNOSIS — R633 Feeding difficulties, unspecified: Secondary | ICD-10-CM

## 2018-08-29 DIAGNOSIS — I714 Abdominal aortic aneurysm, without rupture: Secondary | ICD-10-CM | POA: Insufficient documentation

## 2018-08-29 DIAGNOSIS — Z79899 Other long term (current) drug therapy: Secondary | ICD-10-CM

## 2018-08-29 DIAGNOSIS — E871 Hypo-osmolality and hyponatremia: Secondary | ICD-10-CM | POA: Insufficient documentation

## 2018-08-29 DIAGNOSIS — I129 Hypertensive chronic kidney disease with stage 1 through stage 4 chronic kidney disease, or unspecified chronic kidney disease: Secondary | ICD-10-CM | POA: Diagnosis not present

## 2018-08-29 DIAGNOSIS — Z9221 Personal history of antineoplastic chemotherapy: Secondary | ICD-10-CM | POA: Insufficient documentation

## 2018-08-29 DIAGNOSIS — K1230 Oral mucositis (ulcerative), unspecified: Secondary | ICD-10-CM | POA: Diagnosis not present

## 2018-08-29 DIAGNOSIS — R197 Diarrhea, unspecified: Secondary | ICD-10-CM | POA: Insufficient documentation

## 2018-08-29 DIAGNOSIS — G4700 Insomnia, unspecified: Secondary | ICD-10-CM | POA: Insufficient documentation

## 2018-08-29 DIAGNOSIS — C169 Malignant neoplasm of stomach, unspecified: Secondary | ICD-10-CM

## 2018-08-29 DIAGNOSIS — N182 Chronic kidney disease, stage 2 (mild): Secondary | ICD-10-CM | POA: Diagnosis not present

## 2018-08-29 DIAGNOSIS — K9413 Enterostomy malfunction: Secondary | ICD-10-CM | POA: Diagnosis not present

## 2018-08-29 HISTORY — PX: IR REPLC DUODEN/JEJUNO TUBE PERCUT W/FLUORO: IMG2334

## 2018-08-29 LAB — COMPREHENSIVE METABOLIC PANEL
ALT: 16 U/L (ref 0–44)
AST: 27 U/L (ref 15–41)
Albumin: 3.8 g/dL (ref 3.5–5.0)
Alkaline Phosphatase: 87 U/L (ref 38–126)
Anion gap: 10 (ref 5–15)
BUN: 14 mg/dL (ref 6–20)
CO2: 24 mmol/L (ref 22–32)
Calcium: 9.7 mg/dL (ref 8.9–10.3)
Chloride: 99 mmol/L (ref 98–111)
Creatinine, Ser: 1.04 mg/dL (ref 0.61–1.24)
GFR calc Af Amer: >60 mL/min (ref >60)
GFR calc non Af Amer: >60 mL/min (ref >60)
Glucose, Bld: 126 mg/dL — ABNORMAL HIGH (ref 70–99)
Potassium: 4.4 mmol/L (ref 3.5–5.1)
SODIUM: 133 mmol/L — AB (ref 135–145)
Total Bilirubin: 0.9 mg/dL (ref 0.3–1.2)
Total Protein: 7.5 g/dL (ref 6.5–8.1)

## 2018-08-29 LAB — CBC WITH DIFFERENTIAL/PLATELET
ABS IMMATURE GRANULOCYTES: 0.02 10*3/uL (ref 0.00–0.07)
Basophils Absolute: 0 10*3/uL (ref 0.0–0.1)
Basophils Relative: 0 %
EOS PCT: 3 %
Eosinophils Absolute: 0.2 10*3/uL (ref 0.0–0.5)
HCT: 34.1 % — ABNORMAL LOW (ref 39.0–52.0)
Hemoglobin: 10.9 g/dL — ABNORMAL LOW (ref 13.0–17.0)
IMMATURE GRANULOCYTES: 0 %
LYMPHS ABS: 0.4 10*3/uL — AB (ref 0.7–4.0)
LYMPHS PCT: 8 %
MCH: 27.3 pg (ref 26.0–34.0)
MCHC: 32 g/dL (ref 30.0–36.0)
MCV: 85.3 fL (ref 80.0–100.0)
Monocytes Absolute: 0.8 10*3/uL (ref 0.1–1.0)
Monocytes Relative: 16 %
NEUTROS ABS: 3.8 10*3/uL (ref 1.7–7.7)
NRBC: 0 % (ref 0.0–0.2)
Neutrophils Relative %: 73 %
PLATELETS: 133 10*3/uL — AB (ref 150–400)
RBC: 4 MIL/uL — ABNORMAL LOW (ref 4.22–5.81)
RDW: 32 % — AB (ref 11.5–15.5)
WBC: 5.2 10*3/uL (ref 4.0–10.5)

## 2018-08-29 LAB — LACTATE DEHYDROGENASE: LDH: 165 U/L (ref 98–192)

## 2018-08-29 MED ORDER — IOPAMIDOL (ISOVUE-300) INJECTION 61%
INTRAVENOUS | Status: AC
  Filled 2018-08-29: qty 50

## 2018-08-29 MED ORDER — OXYCODONE HCL 5 MG PO TABS: 5.0000 mg | ORAL_TABLET | Freq: Four times a day (QID) | ORAL | 0 refills | Status: DC | PRN

## 2018-08-29 MED ORDER — LIDOCAINE VISCOUS HCL 2 % MT SOLN
OROMUCOSAL | Status: AC
  Filled 2018-08-29: qty 15

## 2018-08-29 NOTE — Patient Outreach (Signed)
[facility]) Care Management  08/29/2018  Todd [name] 17-Jan-1961 [id number]    Telephone Screen Referral Date :08/28/2018 Referral Source:[facility] ED Census Referral Reason:ED Utilization Insurance:[employer]  Outreach attempt # 1 To patient for screening.  The patient answered the phone and stated that he was at the doctor's office and could not talk at the time.  He ask if I could call him at a later time.   Plan: RN Health Coach will outreach the patient within the next four business days.   Juanell Fairly RN, BSN, [facility] RN Health Coach Disease Management [facility] Dial:  [phone] Fax: [phone]

## 2018-08-29 NOTE — Patient Instructions (Signed)
[facility]  Discharge Instructions: You saw Dr. Higgs today                               _______________________________________________________________  Thank you for choosing [facility] to provide your oncology and hematology care.  To afford each patient quality time with our providers, please arrive at least 15 minutes before your scheduled appointment.  You need to re-schedule your appointment if you arrive 10 or more minutes late.  We strive to give you quality time with our providers, and arriving late affects you and other patients whose appointments are after yours.  Also, if you no show three or more times for appointments you may be dismissed from the clinic.  Again, thank you for choosing [facility]. Our hope is that these requests will allow you access to exceptional care and in a timely manner. _______________________________________________________________  If you have questions after your visit, please contact our office at [phone] between the hours of 8:30 a.m. and 5:00 p.m. Voicemails left after 4:30 p.m. will not be returned until the following business day. _______________________________________________________________  For prescription refill requests, have your pharmacy contact our office. _______________________________________________________________  Recommendations made by the consultant and any test results will be sent to your referring physician. _______________________________________________________________ 

## 2018-08-29 NOTE — Progress Notes (Signed)
Diagnosis Gastric carcinoma ([place]) - Plan: CBC with Differential/Platelet, Comprehensive metabolic panel, CBC with Differential/Platelet, Comprehensive metabolic panel, Lactate dehydrogenase  Staging Cancer Staging No matching staging information was found for the patient.  Assessment and Plan:  1.  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  57 y.o. male initially seen by Dr. Sherrine Maples and reported he was in his usual state of health until around Christmas 2018 when he went camping in [place].  During that trip patient apparently had acute onset of epigastric pain associated with vomiting.  He reports retching with vomiting.  Sometime during that episode patient apparently attempted to put his fingers in the back of his throat to relieve the sensation of food being lodged thinking that he may have had a fish bone that was stuck in his throat.  This culminated in vomiting with small amount of blood.  He subsequently had relief from vomiting but he continued to have difficulty swallowing and hoarseness of voice.  Imaging of the neck showed posterior pharyngeal edema without evidence of any foreign object or fishbone.  The pain subsequently improved gradually.    He underwent a colonoscopy that showed tubular adenomas.  An EGD that revealed diffusely infiltrated wall of the stomach with superficial ulcerations.  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    PET scan was done 10/24/2017 and showed IMPRESSION: 1. Small focus of hypermetabolism along the lesser curvature of the antrum of the stomach, corresponding to recently diagnosed gastric neoplasm. No associated lymphadenopathy or definite findings to suggest metastatic disease to the neck, chest, abdomen or pelvis. 2. Aortic atherosclerosis, in addition to left main and 3 vessel coronary artery disease. Please note that although the presence of coronary  artery calcium documents the presence of coronary artery disease, the severity of this disease and any potential stenosis cannot be assessed on this non-gated CT examination. Assessment for potential risk factor modification, dietary therapy or pharmacologic therapy may be warranted, if clinically indicated. 3. In addition, there is fusiform aneurysmal dilatation of the infrarenal abdominal aorta which measures up to 3.9 x 3.7 cm. Recommend followup by ultrasound in 2 years. This recommendation follows ACR consensus guidelines: White Paper of the ACR Incidental Findings Committee II on Vascular Findings. J Am Coll Radiol 2013; 10:789-794.  He Is followed by Dr. Barry Dienes of surgery.  He initially indicated he did not desire surgery or chemotherapy.   Pt has diffuse gastric adenocarcinoma that appears to be localized to the stomach.  Histologically he appears to have a signet ring variety with diffuse involvement.  He was treated with neoadjuvant chemotherapy followed by surgery followed by adjuvant chemotherapy.  Previously, I have discussed with him that even if he were to be treated with surgery alone based on the diffuse nature of his cancer he was still be recommended for adjuvant therapy.    He was treated with FLOT regimen (5-FU dose at 2600 mg/m with 24 hours, oxaliplatin 85 mg/m, leucovorin 200 mg/m, and Taxotere 50 mg/m all on day 1 every 2 weeks for 4 cycles before surgery and 4 cycles following surgery.   PET scan done 10/24/2017 showed no evidence of distant disease.    EUS that was done 11/24/2017 showed:  Normal esophagus, ulcerated mass involving the lesser curvature of the stomach approximately mid body.  Mass measured 5 cm across 5 cm in length.  Mass appeared to be 7- 8 cm from the pylorus.  It  was also 5 to 6 cm from the GE junction.  It appeared to pass into and through the muscularis propria as well as the serosa making it a T4 a lesion.  Mass was 1.6 cm in maximal  thickness.  No adenopathy was noted.  He was staged as a T4 a N0 by EUS.    He has undergone genetic testing that was negative.     He has completed 4 cycles of FLOT.  Pt failed to keep his follow-up to go over PET scan and was referred to Dr. Barry Dienes for surgical evaluation.  He was planned for surgery on 04/26/2018.    Pt cancelled surgery and wanted  to wait until next year for operation.  He was also reporting port problems.    Pet scan done 03/20/2018 showed IMPRESSION: 1. No evidence of residual gastric carcinoma. 2. No evidence of carcinoma metastasis in the chest, abdomen, or pelvis. 3. Coronary artery calcification and Aortic Atherosclerosis (ICD10-I70.0).  Previously I discussed with him scan findings are due to chemotherapy and if he remains off treatment for a significant amount of time he has a significant risk for morbidity and mortality if he continues to be noncompliant with recommendations.  I have discussed with him he has a significant risk for death from advanced gastric cancer if he continues to defer surgery and adjuvant chemotherapy especially until next year which is what he was requesting.    After discussion he continued to defer surgery until after 04/29/2018.   Pt underwent total gastrectomy on 05/04/2018 Pathology returned as  1. Stomach, resection for tumor - INVASIVE ADENOCARCINOMA, POORLY DIFFERENTIATED, SPANNING AT LEAST 3.2 CM. - ADENOCARCINOMA IS PRESENT AT THE PROXIMAL MARGIN OF SPECIMEN 1. - LYMPHOVASCULAR INVASION IS IDENTIFIED. - PERINEURAL INVASION IS IDENTIFIED. - ADENOCARICNOMA EXTENDS THROUGH THE MUSCULARIS PROPRIA AND INTO SOFT TISSUE WITH PERITONEAL INVOLVEMENT. - METASTATIC CARCINOMA IN 6 OF 6 LYMPH NODES (6/6). - SEE ONCOLOGY TABLE BELOW. 2. Stomach, resection, Additional proximal margin - INVASIVE ADENOCARCINOMA, PRESENT AT THE PROXIMAL MARGIN OF SPECIMEN 2. - METASTATIC CARCINOMA IN 1 OF 4 LYMPH NODES (1/4). 3. Stomach, resection, additional  proximal margin - INVASIVE ADENOCARCINOMA, PRESENT AT THE PROXIMAL MARGIN OF SPECIMEN 3. 4. Stomach, resection, Additional proximal margin - INVASIVE ADENOCARCINOMA, PRESENT AT THE PROXIMAL MARGIN OF SPECIMEN 4. Microscopic Comment  I discussed with the patient that due to + margin and numerous LN he is recommended for adjuvant chemotherapy and RT.  He initially refused RT but after discussion he is willing to meet with RT for evaluation.    Based on results of CALGB study [id number] pts with resected gastric cancer who received postoperative chemoRT with ECF compared to 5FU based regimen did not show improved survival compared to 5FU before and after RT.  A recent study also showed that use of Xeloda with cisplatin followed by RT with Xeloda was better tolerated and had less toxicity than 73f.  Xeloda is comparable to 5FU with RT.  I discussed with him option of 2 cycles of Xeloda dosed at 1000 mg/m2 bid for 14 days with cisplatin 75 mg/m2 on D1 every 3 weeks followed by Xeloda with RT followed by an additional 1 or 2 cycles of XP.  XP had no difference in survival compared to 5 FU/LV.    ChemoRT demonstrated survival advantage over surgery alone in INT-0116 study.  Pt is recommended for chemo/RT.  Pt does not desire multiple days of chemotherapy.  I have discussed with him option of XP  initially followed by Xeloda with RT.    Pt begun RT on 08/01/2018.  I have again discussed with him Xeloda  825 mg/m2 bid Monday through Friday during RT.  Total dose 1500 mg po bid M-F during RT.  Rx sent to [facility].  I have asked for navigator to provide him a calender regarding xeloda days during RT.    Labs done 08/29/2018 reviewed and showed WBC 5.2 Hb 10.9 plts 133,000.  Chemistries WNL with K+ 4.4 Cr 1 and normal LFTs.    Pt will continue weekly labs on RT.  He will be seen for follow-up in 09/2018 pending RT completion. All questions answered and he expressed understanding of the information  presented.    2.  Feeding tube concerns.  Pt accidentally pulled out feeding tube.  He had IR place tube today 08/29/2018.  Pt should follow-up with nutrition as directed.    3.  Request for pain meds.  Rx for Oxycodone as directed # 90 sent to pharmacy.    4.  Hypertension.  BP 118/96.   Follow-up with PCP.  5.  Smoking.  Cessation is recommended.  PET scan showed no clear lung abnormalities.  6.  Abdominal aortic aneurysm.  This was noted on recent PET scan.  Patient was also noted to have atherosclerosis on recent PET scan.  He has been seen by vascular surgery and cardiology in the past and was on aspirin and statins.  7.  Compliance with treatment recommendations.  This has been a problem with Mr. Todd [name].  He has delayed surgery and had some compliance issues with follow-up. Pt was also not taking Xeloda as prescribed.  I have again asked for nurse navigator to provide calender to pt regarding Xeloda days of therapy.    8.  Diarrhea.  Imodium prn.    25 minutes spent with more than 50% spent in counseling and coordination of care.     Interval History:  As above.   Current Status:  Pt is seen today for follow-up.  He is here to go over labs.  He had new feeding tube placed.  He is requesting refills on pain meds.      Malignant neoplasm of stomach ([place])   11/11/2017 Initial Diagnosis    Malignant neoplasm of stomach ([place])    12/16/2017 Genetic Testing    Negative genetic testing on the common hereditary cancer panel.  The Hereditary Gene Panel offered by [facility] includes sequencing and/or deletion duplication testing of the following 47 genes: APC, ATM, AXIN2, BARD1, BMPR1A, BRCA1, BRCA2, BRIP1, CDH1, CDK4, CDKN2A (p14ARF), CDKN2A (p16INK4a), CHEK2, CTNNA1, DICER1, EPCAM (Deletion/duplication testing only), GREM1 (promoter region deletion/duplication testing only), KIT, MEN1, MLH1, MSH2, MSH3, MSH6, MUTYH, NBN, NF1, NHTL1, PALB2, PDGFRA, PMS2, POLD1, POLE, PTEN, RAD50, RAD51C, RAD51D,  SDHB, SDHC, SDHD, SMAD4, SMARCA4. STK11, TP53, TSC1, TSC2, and VHL.  The following genes were evaluated for sequence changes only: SDHA and HOXB13 c.251G>A variant only. The report date is December 16, 2017.      Gastric carcinoma ([place])   01/23/2018 Initial Diagnosis    Gastric carcinoma ([place])    01/26/2018 - 03/27/2018 Chemotherapy    The patient had palonosetron (ALOXI) injection 0.25 mg, 0.25 mg, Intravenous,  Once, 4 of 4 cycles Administration: 0.25 mg (01/31/2018), 0.25 mg (02/14/2018), 0.25 mg (02/28/2018), 0.25 mg (03/14/2018) pegfilgrastim (NEULASTA ONPRO KIT) injection 6 mg, 6 mg, Subcutaneous, Once, 4 of 4 cycles Administration: 6 mg (02/01/2018), 6 mg (03/01/2018) leucovorin 400 mg in dextrose  5 % 250 mL infusion, 428 mg, Intravenous,  Once, 4 of 4 cycles Administration: 400 mg (01/31/2018), 400 mg (02/14/2018), 400 mg (02/28/2018), 400 mg (03/14/2018) oxaliplatin (ELOXATIN) 180 mg in dextrose 5 % 500 mL chemo infusion, 85 mg/m2 = 180 mg, Intravenous,  Once, 4 of 4 cycles Administration: 180 mg (01/31/2018), 180 mg (02/14/2018), 180 mg (02/28/2018), 180 mg (03/14/2018) DOCEtaxel (TAXOTERE) 110 mg in sodium chloride 0.9 % 250 mL chemo infusion, 50 mg/m2 = 110 mg, Intravenous,  Once, 4 of 4 cycles Administration: 110 mg (01/31/2018), 110 mg (02/14/2018), 110 mg (02/28/2018), 110 mg (03/14/2018) fluorouracil (ADRUCIL) 5,550 mg in sodium chloride 0.9 % 139 mL chemo infusion, 2,600 mg/m2 = 5,550 mg, Intravenous, 1 Day/Dose, 4 of 4 cycles Administration: 5,550 mg (01/31/2018), 5,550 mg (02/14/2018), 5,550 mg (02/28/2018), 5,550 mg (03/14/2018)  for chemotherapy treatment.     06/19/2018 -  Chemotherapy    The patient had palonosetron (ALOXI) injection 0.25 mg, 0.25 mg, Intravenous,  Once, 2 of 2 cycles Administration: 0.25 mg (06/20/2018), 0.25 mg (07/11/2018) CISplatin (PLATINOL) 150 mg in sodium chloride 0.9 % 500 mL chemo infusion, 75 mg/m2 = 150 mg (100 % of original dose 75 mg/m2), Intravenous,  Once, 2 of 2 cycles Dose  modification: 75 mg/m2 (original dose 75 mg/m2, Cycle 1, Reason: Other (see comments)) Administration: 150 mg (06/20/2018), 150 mg (07/11/2018) fosaprepitant (EMEND) 150 mg, dexamethasone (DECADRON) 12 mg in sodium chloride 0.9 % 145 mL IVPB, , Intravenous,  Once, 2 of 2 cycles Administration:  (06/20/2018),  (07/11/2018)  for chemotherapy treatment.       Problem List Patient Active Problem List   Diagnosis Date Noted  . Elevated lipase [R74.8] 03/17/2018  . Hypotension [I95.9] 03/17/2018  . Leukocytosis [D72.829] 03/17/2018  . Gastric carcinoma ([place]) [C16.9] 01/23/2018  . Genetic testing [Z13.79] 12/20/2017  . Family history of cancer [Z80.9]   . IBS (irritable bowel syndrome) [K58.9] 11/28/2017  . Malignant neoplasm of stomach ([place]) [C16.9] 11/11/2017  . Constipation [K59.00] 08/30/2017  . GERD (gastroesophageal reflux disease) [K21.9] 08/30/2017  . Globus sensation [R09.89] 08/30/2017  . Cerebral artery occlusion with cerebral infarction ([place]) [I63.50] 12/20/2016  . PAD (peripheral artery disease) ([place]) [I73.9] 08/09/2014  . CAD (coronary artery disease) [I25.10] 07/22/2014  . Tobacco abuse [Z72.0] 07/22/2014  . 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 Pure hypercholesterolemia [E78.00] 05/28/2014  . Hypertension [I10] 07/19/2012  . Hyperlipidemia [E78.5] 07/19/2012  . PVD (peripheral vascular disease) with claudication ([place]) [I73.9] 06/29/2012  . Atherosclerosis of native arteries of the extremities with intermittent claudication [I70.219] 04/20/2012    Past Medical History Past Medical History:  Diagnosis Date  . CAD (coronary artery disease)    a. Cath 07/2012 following abnormal nuclear stress test: 20-30% LAD, 20-30% OM1, occluded branch off OM2 that fills slowly via collaterals, small nondominant RCA occluded proximally, fills slowly via collaterals.  . CKD (chronic kidney disease), stage II   . Family history of cancer   . Gastric adenocarcinoma  ([facility])   . History of blood transfusion    "when I was a child"  . Hyperlipidemia   . Hypertension   . Peripheral vascular disease ([place])    a. bilateral superficial femoral artery stents in May/August of 2012. b. Left femoral to above-knee popliteal bypass in 08/2012.  Marland Kitchen Plantar fasciitis   . Renal insufficiency   . Stroke [facility])    2008 right side weakness  . Tobacco abuse     Past Surgical History Past Surgical History:  Procedure Laterality Date  . ABDOMINAL AORTAGRAM N/A 04/27/2012   Procedure: ABDOMINAL Maxcine Ham;  Surgeon: Conrad Laie, MD;  Location: [facility] CATH LAB;  Service: Cardiovascular;  Laterality: N/A;  . ABDOMINAL AORTAGRAM N/A 08/09/2014   Procedure: ABDOMINAL Maxcine Ham;  Surgeon: Elam Dutch, MD;  Location: [facility] CATH LAB;  Service: Cardiovascular;  Laterality: N/A;  . BIOPSY  10/06/2017   Procedure: BIOPSY;  Surgeon: Daneil Dolin, MD;  [facility] ENDO SU[facility];  Service: Endoscopy;;  gastric   . CARDIAC CATHETERIZATION    . COLONOSCOPY    . COLONOSCOPY WITH PROPOFOL N/A 10/06/2017   Procedure: COLONOSCOPY WITH PROPOFOL;  Surgeon: Daneil Dolin, MD;  Location[facility] ENDO SUITE;  Service: Endoscopy;  Laterality: N/A;  1:45pm - patient can't come earlier due to transportation  . ESOPHAGOGASTRODUODENOSCOPY (EGD) WITH PROPOFOL N/A 10/06/2017   Procedure: ESOPHAGOGASTRODUODENOSCOPY (EGD) WITH PROPOFOL;  Surgeon: Daneil Dolin, MD;  [facility] END[facility];  Service: Endoscopy;  Laterality: N/A;  . EUS N/A 11/24/2017   Procedure: UPPER ENDOSCOPIC ULTRASOUND (EUS) RADIAL;  Surgeon: Milus Banister, MD;  Location: [facility] ENDOSCOPY;  Service: Endoscopy;  Laterality: N/A;  . FEMORAL ARTERY - POPLITEAL ARTERY BYPASS GRAFT    . FEMORAL-POPLITEAL BYPASS GRAFT  09/26/2012   Procedure: BYPASS GRAFT FEMORAL-POPLITEAL ARTERY;  Surgeon: Elam Dutch, MD;  Location: [facility] OR;  Service: Vascular;  Laterality: Left;  using 39m Gore-Tex Propaten Ringed Graft, Vein patch on the proximal and  distal anastomosis  . FEMORAL-POPLITEAL BYPASS GRAFT Right 08/12/2014   Procedure:  FEMORAL-POPLITEAL ARTERY BYPASS WITH SAPHENOUS VEIN GRAFT, INTRAOPERATIVE ARTERIOGRAM;  Surgeon: CElam Dutch MD;  [place] [facility]  Service: Vascular;  Laterality: Right;  . GASTRECTOMY N/A 05/04/2018   Procedure: Total Gastrectomy with Feeding Jejunostomy;  Surgeon: BStark Klein MD;  [facility]  Service: General;  Laterality: N/A;  . INTRAOPERATIVE ARTERIOGRAM  08/12/2014   Procedure: INTRA OPERATIVE ARTERIOGRAM;  Surgeon: CElam Dutch MD;  [place]  Service: Vascular;;  . IR [place]/JEJUNO TUBE PERCUT WVivianne Master 08/29/2018  . LAPAROSCOPY N/A 05/04/2018   Procedure: LAPAROSCOPY DIAGNOSTIC ERAS PATHWAY;  Surgeon: BStark Klein MD;  Location: [facility]  Service: General;  Laterality: N/A;  EPIDURAL  . POLYPECTOMY  10/06/2017   Procedure: POLYPECTOMY;  Surgeon: RDaneil Dolin MD;  [facility];  Service: Endoscopy;;  splenic flexure,  ascending  . PORTACATH PLACEMENT N/A 01/24/2018   Procedure: INSERTION PORT-A-CATH;  Surgeon: BStark Klein MD;  [place] [facility]  Service: General;  Laterality: N/A;  . PR VEIN BYPASS GRAFT,AORTO-FEM-POP    . stents in bil legs      Family History Family History  Problem Relation Age of Onset  . Diabetes Mother   . Heart disease Mother        before age 55 Triple BPG  . Cancer Mother        Luekemia  . Deep vein thrombosis Mother   . Hyperlipidemia Mother   . Hypertension Mother   . Heart attack Mother   . Peripheral vascular disease Mother   . Stroke Mother        x 3  . Pancreatic cancer Mother   . Arthritis Father   . Hypertension Father   . Kidney disease Father   . Cancer Maternal Grandmother        NOS  . Diabetes Cousin   . Cancer Cousin        NOS, pat first cousin  . Cancer  Cousin        NOS, pat first cousin  . Colon cancer Neg Hx   . Gastric cancer Neg Hx   . Esophageal cancer Neg Hx      Social History   reports that he has been smoking cigars. He started smoking about 43 years ago. He has a 25.00 pack-year smoking history. He has never used smokeless tobacco. He reports that he has current or past drug history. Drug: Marijuana. He reports that he does not drink alcohol.  Medications  Current Outpatient Medications:  .  Loperamide HCl (IMODIUM PO), Take 1 tablet by mouth as needed (Pt takes 1 tablet every 2 days to prevent diarrhea)., Disp: , Rfl:  .  aspirin EC 81 MG tablet, Take 81 mg by mouth daily., Disp: , Rfl:  .  capecitabine (XELODA) 500 MG tablet, TAKE 3 TABLETS BY MOUTH TWICE A DAY MONDAY THROUGH FRIDAY WITH RADIATION, Disp: 120 tablet, Rfl: 0 .  CHANTIX STARTING MONTH PAK 0.5 MG X 11 & 1 MG X 42 tablet, See admin instructions., Disp: , Rfl: 0 .  chlorproMAZINE (THORAZINE) 10 MG tablet, Take 1 tablet (10 mg total) by mouth 4 (four) times daily as needed for hiccoughs., Disp: 30 tablet, Rfl: 1 .  CISPLATIN IV, Inject into the vein., Disp: , Rfl:  .  feeding supplement, ENSURE ENLIVE, (ENSURE ENLIVE) LIQD, Take 237 mLs by mouth 2 (two) times daily between meals., Disp: 237 mL, Rfl: 12 .  lidocaine-prilocaine (EMLA) cream, Apply to affected area once, Disp: 30 g, Rfl: 3 .  methocarbamol (ROBAXIN) 500 MG tablet, Take 1 tablet (500 mg total) by mouth every 6 (six) hours as needed for muscle spasms., Disp: 20 tablet, Rfl: 1 .  Nutritional Supplements (FEEDING SUPPLEMENT, OSMOLITE 1.5 CAL,) LIQD, Place 1,000 mLs into feeding tube daily., Disp: 30 Bottle, Rfl: 0 .  ondansetron (ZOFRAN) 8 MG tablet, Take 1 tablet (8 mg total) by mouth 2 (two) times daily as needed. Start on the third day after chemotherapy., Disp: 30 tablet, Rfl: 1 .  oxyCODONE (OXY IR/ROXICODONE) 5 MG immediate release tablet, Take 1 tablet (5 mg total) by mouth every 6 (six) hours as needed., Disp: 90 tablet, Rfl: 0 .  prochlorperazine (COMPAZINE) 10 MG tablet, TAKE 1 TABLET BY MOUTH EVERY 6 HOURS AS NEEDED FOR NAUSEA OR  VOMITING, Disp: 30 tablet, Rfl: 1 No current facility-administered medications for this visit.   Facility-Administered Medications Ordered in Other Visits:  .  iopamidol (ISOVUE-300) 61 % injection, , , ,  .  lidocaine (XYLOCAINE) 2 % viscous mouth solution, , , ,   Allergies Potassium-containing compounds  Review of Systems Review of Systems - Oncology ROS negative other than minor abdominal pain near tube site.     Physical Exam  Vitals Wt Readings from Last 3 Encounters:  08/29/18 167 lb 8 oz (76 kg)  08/27/18 171 lb (77.6 kg)  08/15/18 172 lb 12.8 oz (78.4 kg)   Temp Readings from Last 3 Encounters:  08/29/18 97.8 F (36.6 C) (Oral)  08/27/18 98.1 F (36.7 C) (Oral)  08/15/18 98.6 F (37 C) (Oral)   BP Readings from Last 3 Encounters:  08/29/18 (!) 118/96  08/27/18 (!) 151/98  08/15/18 133/88   Pulse Readings from Last 3 Encounters:  08/29/18 (!) 114  08/27/18 82  08/15/18 (!) 104   Constitutional: Well-developed, well-nourished, and in no distress.   HENT: Head: Normocephalic and atraumatic.  Mouth/Throat: No oropharyngeal exudate. Mucosa moist. Eyes: Pupils are equal,  round, and reactive to light. Conjunctivae are normal. No scleral icterus.  Neck: Normal range of motion. Neck supple. No JVD present.  Cardiovascular: Normal rate, regular rhythm and normal heart sounds.  Exam reveals no gallop and no friction rub.   No murmur heard. Pulmonary/Chest: Effort normal and breath sounds normal. No respiratory distress. No wheezes.No rales.  Abdominal: Soft. Bowel sounds are normal. No distension. Minor tenderness near tube site.  No guarding.  Feeding tube in place on left.   Musculoskeletal: No edema or tenderness.  Lymphadenopathy: No cervical, axillary or supraclavicular adenopathy.  Neurological: Alert and oriented to person, place, and time. No cranial nerve deficit.  Skin: Skin is warm and dry. No rash noted. No erythema. No pallor.  Psychiatric: Affect and  judgment normal.   Labs Appointment on 08/29/2018  Component Date Value Ref Range Status  . WBC 08/29/2018 5.2  4.0 - 10.5 K/uL Final  . RBC 08/29/2018 4.00* 4.22 - 5.81 MIL/uL Final  . Hemoglobin 08/29/2018 10.9* 13.0 - 17.0 g/dL Final  . HCT 08/29/2018 34.1* 39.0 - 52.0 % Final  . MCV 08/29/2018 85.3  80.0 - 100.0 fL Final  . MCH 08/29/2018 27.3  26.0 - 34.0 pg Final  . MCHC 08/29/2018 32.0  30.0 - 36.0 g/dL Final  . RDW 08/29/2018 32.0* 11.5 - 15.5 % Final  . Platelets 08/29/2018 133* 150 - 400 K/uL Final  . nRBC 08/29/2018 0.0  0.0 - 0.2 % Final  . Neutrophils Relative % 08/29/2018 73  % Final  . Neutro Abs 08/29/2018 3.8  1.7 - 7.7 K/uL Final  . Lymphocytes Relative 08/29/2018 8  % Final  . Lymphs Abs 08/29/2018 0.4* 0.7 - 4.0 K/uL Final  . Monocytes Relative 08/29/2018 16  % Final  . Monocytes Absolute 08/29/2018 0.8  0.1 - 1.0 K/uL Final  . Eosinophils Relative 08/29/2018 3  % Final  . Eosinophils Absolute 08/29/2018 0.2  0.0 - 0.5 K/uL Final  . Basophils Relative 08/29/2018 0  % Final  . Basophils Absolute 08/29/2018 0.0  0.0 - 0.1 K/uL Final  . RBC Morphology 08/29/2018 ANISOCYTOSIS   Final  . Immature Granulocytes 08/29/2018 0  % Final  . Abs Immature Granulocytes 08/29/2018 0.02  0.00 - 0.07 K/uL Final  . Schistocytes 08/29/2018 PRESENT   Final   Comment: FEW Performed at [facility], [address], [place], [place] [address]   . Sodium 08/29/2018 133* 135 - 145 mmol/L Final  . Potassium 08/29/2018 4.4  3.5 - 5.1 mmol/L Final  . Chloride 08/29/2018 99  98 - 111 mmol/L Final  . CO2 08/29/2018 24  22 - 32 mmol/L Final  . Glucose, Bld 08/29/2018 126* 70 - 99 mg/dL Final  . BUN 08/29/2018 14  6 - 20 mg/dL Final  . Creatinine, Ser 08/29/2018 1.04  0.61 - 1.24 mg/dL Final  . Calcium 08/29/2018 9.7  8.9 - 10.3 mg/dL Final  . Total Protein 08/29/2018 7.5  6.5 - 8.1 g/dL Final  . Albumin 08/29/2018 3.8  3.5 - 5.0 g/dL Final  . AST 08/29/2018 27  15 - 41 U/L Final  . ALT  08/29/2018 16  0 - 44 U/L Final  . Alkaline Phosphatase 08/29/2018 87  38 - 126 U/L Final  . Total Bilirubin 08/29/2018 0.9  0.3 - 1.2 mg/dL Final  . GFR calc non Af [place] 08/29/2018 >60  >60 mL/min Final  . GFR calc Af Amer 08/29/2018 >60  >60 mL/min Final  . Anion gap 08/29/2018 10  5 - 15 Final   Performed at [facility], [address], [place], [place] [address]  . LDH 08/29/2018 165  98 - 192 U/L Final   Performed at [facility], [address] [place], [place] [address]     Pathology Orders Placed This Encounter  Procedures  . CBC with Differential/Platelet    Standing Status:   Standing    Number of Occurrences:   4    Standing Expiration Date:   08/29/2018  . Comprehensive metabolic panel    Standing Status:   Standing    Number of Occurrences:   4    Standing Expiration Date:   08/29/2018  . CBC with Differential/Platelet    Standing Status:   Future    Standing Expiration Date:   08/29/2020  . Comprehensive metabolic panel    Standing Status:   Future    Standing Expiration Date:   08/29/2020  . Lactate dehydrogenase    Standing Status:   Future    Standing Expiration Date:   08/29/2020       Zoila Shutter MD

## 2018-08-29 NOTE — Progress Notes (Signed)
Nutrition Follow-up  57 y/o male PMHx CAD, CKD2, PVD, CVA, Tobacco abuse, HTN/HLD. Developed acute epigastric pain w/ n/v and globus sensation Dec 2018. EGD would later show GEJ ulcers, s/p biopsy + for gastric carcinoma. Underwent 4 cycles of neoadjuvant FLOT chemo regimen. Pt initially decided to forego recommended surg intervention. 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 Has completed adjuvant IV chemotherapy. Currently on oral chemo. Begun adjuvant radiation 11/5-ongoing.   RD following up w/ pt today regarding tube feeding. Per chart, pt's j tube access was inadvertently lost on 12/1. He had presented to ED over concern of j-tube sutures coming out, and at some point during visit, his tube "fell out" and MD was unable to re cannulate. Fortunately, it was able to be replaced this afternoon.   He reports compliance with TF regimen of Osmolite 1.5 @75  x12-15 hrs w/ 60 ml flush before and after +30 ml promod q 24 hrs w/ 60 ml flush before and after. (goal  5 cans q day) The length of feeding is given as range because pt has said his feeding time depends on if he has "stuff to do" outside of the house.    Depending on if he runs the TF for 12 or 15 hrs, TF will provide him: 1450-1790 kcals, 66g-81g Pro and 926 ml-1097 fluid. He meets remaining nutrition needs w/ 1 ensure/d and his oral intake.   Today, he reports that he typically has several ounces of tube feeding remaining each night that he just discards.   Regarding oral intake, he initially reports he has been eating well, though later in the conversation he contradicts this and says he "has not been eating" for several days. He gives no reasons as to why he hasn't been eating. He says he still is eating soft foods, "oatmeal and Ensure". He says he ate [place] and sweet potatoes yesterday.   Pt denies radiation having any significant impact on his intake. He says the only time he has  odynophagia is when his foods are not ground up. When this occurs, he has to "drink into it doesn't hurt anymore"  Towards the end of our conversation, he abruptly notes "I have been throwing up a lot". He says these episodes of vomiting follow periods of intense sweating. He says these occur randomly and denies any aggravating factors. May be reflux related?  He says he has cut back on the amount he is smoking and he is proud of this.   Today, his wt is 167.5 lbs. He has lost a little over 5 lbs since he was last seen by RD. He looks to have lost a few lbs in just the past few days, likely related to inability to hydrate/feed w/o his J tube.    Wt Readings from Last 10 Encounters:  08/29/18 167 lb 8 oz (76 kg)  08/27/18 171 lb (77.6 kg)  08/15/18 172 lb 12.8 oz (78.4 kg)  07/24/18 169 lb 1.6 oz (76.7 kg)  07/13/18 173 lb (78.5 kg)  07/11/18 174 lb 3.2 oz (79 kg)  07/05/18 173 lb 8 oz (78.7 kg)  07/05/18 178 lb 9.2 oz (81 kg)  07/02/18 178 lb 9.2 oz (81 kg)  06/20/18 177 lb 9.6 oz (80.6 kg)   MEDICATIONS:  Chemotherapy: Xeloda Other: Thorazine, Ensure Enlive, Zofran, Compazine, Oxycodone, Robaxin  LABS: Albumin: 3.8, up from 3.4 (11/19),  Na:133, BG:126  Recent Labs  Lab 08/29/18 1245  NA 133*  K 4.4  CL 99  CO2 24  BUN 14  CREATININE 1.04  CALCIUM 9.7  GLUCOSE 126*     ANTHROPOMETRICS: Height:  Ht Readings from Last 1 Encounters:  08/27/18 6' 1"  (1.854 m)   Weight:  Wt Readings from Last 1 Encounters:  08/29/18 167 lb 8 oz (76 kg)   BMI:  BMI Readings from Last 1 Encounters:  08/29/18 22.10 kg/m   UBW: 200-205 lbs IBW: 184 lbs or 83.64 kg  Wt change: -5.25  lbs x 2 weeks - 5 lbs x 3 months  - 17 lbs x 6 months  - 32 lbs x 1 yr (199 Dec 2018)    ESTIMATED ENERGY NEEDS:  Kcal: 2300-2500 (30- 33 kcal/kg bw) Protein:100-115g Pro (1.3-1.5g/kg bw) Fluid: >2.3 L fluid (30 ml/kg bw)  NUTRITION DIAGNOSIS:  Increased protein/kcal needs related to cancer and  cancer related treatments as evidenced by the estimated nutritional requirements for this condition  Ongoing  DOCUMENTATION CODES:  Not applicable  INTERVENTION:   Pt lost a few lbs in the past couple days. Patient went approximately 48 hrs without tube feeding d/t inadvertent loss of access. While this is felt to be main cause of his weight loss, RD also discovered today that pt is not infusing a full 5 cans. He says he has several ounces of tube feeding left in his bag each night that he throws out.   Given his weight loss and questionable level of oral intake, RD asked him to just increase his TF to 80 cc/hr to use up the TF he is throwing out. He is agreeable to this.  RD again noted he should try his best to infuse all 5 cans. If he runs his pump at 80 cc/hr, this will take ~14.75 hrs.   New TF regimen: 5 cans Osmolite 1.5 w/ 60 ml flush before and after +30 ml promod q 24 hrs w/ 60 ml flush before and after. This regimen provides 1875 kcals, 85g Pro, 1145 ml fluid (including 240 cc from flushes)  He will restart his tube feeding tonight.  RD provided patient with his 9th case of Ensure.  Will continue to monitor weight closely, as this is really the only feedback tool that RD has available given his unreliable oral reporting    Plan of care discussed with MD.    GOAL:   Oral intake to meet >90% of estimate needs  Wt stability vs gain  Patient will demonstrate compliance w/ TF  Not met  MONITOR:  Weight, TF tolerance, oral diet tolerance, tolerance to chemotherapy/radiation  Ongoing  Next Visit: 12/17   [name] RD, LDN, CNSC Clinical Nutrition Available Tues-Sat via Pager: [phone] 08/29/2018 1:54 PM

## 2018-08-29 NOTE — Procedures (Signed)
Pre procedural Dx: Dysphagia, Inadvertently removed J tube. Post procedural Dx: Same  Successful fluoroscopic guided replacement of exisitng  16 Fr balloon retention J tube.   The feeding tube is ready for immediate use.  EBL: None  Complications: None immediate.  Ronny Bacon, MD Pager #: [phone]

## 2018-08-30 DIAGNOSIS — Z510 Encounter for antineoplastic radiation therapy: Secondary | ICD-10-CM | POA: Diagnosis not present

## 2018-08-30 DIAGNOSIS — C169 Malignant neoplasm of stomach, unspecified: Secondary | ICD-10-CM | POA: Diagnosis not present

## 2018-08-31 ENCOUNTER — Encounter (HOSPITAL_COMMUNITY): Payer: Self-pay | Admitting: *Deleted

## 2018-08-31 ENCOUNTER — Other Ambulatory Visit: Payer: Self-pay

## 2018-08-31 DIAGNOSIS — C169 Malignant neoplasm of stomach, unspecified: Secondary | ICD-10-CM | POA: Diagnosis not present

## 2018-08-31 DIAGNOSIS — Z510 Encounter for antineoplastic radiation therapy: Secondary | ICD-10-CM | POA: Diagnosis not present

## 2018-08-31 NOTE — Patient Outreach (Addendum)
[facility]) Care Management  08/31/2018  TEREK BEE 1961/06/01 [id number]    Telephone Screen Referral Date :08/28/2018 Referral Source:[facility] ED Census Referral Reason:ED Utilization Insurance:[employer]  Outreach attempt # 2 to the patient for screening. HIPAA verified. The patient was able to complete the screening.  The patient states that he does have HTN but it is under control. At this time he feels that he is not in need of any services.  He states that he has everything that is needed.    Plan:  RN Health Coach will close the case at this time due to no further interventions needed.  RN Health Coach will send the patient a pamphlet for future use.   Lazaro Arms RN, BSN, [place] Direct Dial:  [phone]  Fax: [phone]

## 2018-08-31 NOTE — Progress Notes (Signed)
I called and spoke with patient via telephone and discussed when to take Xeloda per Dr. Walden Field request. I advised him to only take the Xeloda Monday through Friday with radiation.  Patient states that he just misunderstood and thought he was supposed to take it continuously.  I told him that I have a calendar made out for him until the 17th of this month. Per [facility], that is the patients last day of radiation.  I told him when his radiation stops to no longer take any of the Xeloda even if he has pills still remaining in the bottle.  He verbalizes understanding and states that he will stop by here and get the calendar.  He was advised to call back with any questions or concerns.

## 2018-09-01 DIAGNOSIS — C163 Malignant neoplasm of pyloric antrum: Secondary | ICD-10-CM | POA: Diagnosis not present

## 2018-09-01 DIAGNOSIS — C169 Malignant neoplasm of stomach, unspecified: Secondary | ICD-10-CM | POA: Diagnosis not present

## 2018-09-01 DIAGNOSIS — Z510 Encounter for antineoplastic radiation therapy: Secondary | ICD-10-CM | POA: Diagnosis not present

## 2018-09-04 DIAGNOSIS — C169 Malignant neoplasm of stomach, unspecified: Secondary | ICD-10-CM | POA: Diagnosis not present

## 2018-09-04 DIAGNOSIS — Z510 Encounter for antineoplastic radiation therapy: Secondary | ICD-10-CM | POA: Diagnosis not present

## 2018-09-05 ENCOUNTER — Other Ambulatory Visit (HOSPITAL_COMMUNITY): Payer: Self-pay

## 2018-09-05 DIAGNOSIS — C169 Malignant neoplasm of stomach, unspecified: Secondary | ICD-10-CM | POA: Diagnosis not present

## 2018-09-05 DIAGNOSIS — Z510 Encounter for antineoplastic radiation therapy: Secondary | ICD-10-CM | POA: Diagnosis not present

## 2018-09-06 DIAGNOSIS — C169 Malignant neoplasm of stomach, unspecified: Secondary | ICD-10-CM | POA: Diagnosis not present

## 2018-09-06 DIAGNOSIS — Z510 Encounter for antineoplastic radiation therapy: Secondary | ICD-10-CM | POA: Diagnosis not present

## 2018-09-07 DIAGNOSIS — C169 Malignant neoplasm of stomach, unspecified: Secondary | ICD-10-CM | POA: Diagnosis not present

## 2018-09-07 DIAGNOSIS — Z510 Encounter for antineoplastic radiation therapy: Secondary | ICD-10-CM | POA: Diagnosis not present

## 2018-09-08 DIAGNOSIS — C163 Malignant neoplasm of pyloric antrum: Secondary | ICD-10-CM | POA: Diagnosis not present

## 2018-09-08 DIAGNOSIS — C169 Malignant neoplasm of stomach, unspecified: Secondary | ICD-10-CM | POA: Diagnosis not present

## 2018-09-08 DIAGNOSIS — Z510 Encounter for antineoplastic radiation therapy: Secondary | ICD-10-CM | POA: Diagnosis not present

## 2018-09-09 DIAGNOSIS — C160 Malignant neoplasm of cardia: Secondary | ICD-10-CM | POA: Diagnosis not present

## 2018-09-11 ENCOUNTER — Other Ambulatory Visit (HOSPITAL_COMMUNITY): Payer: Self-pay | Admitting: Internal Medicine

## 2018-09-11 ENCOUNTER — Encounter: Payer: Self-pay | Admitting: Pharmacist

## 2018-09-11 DIAGNOSIS — Z510 Encounter for antineoplastic radiation therapy: Secondary | ICD-10-CM | POA: Diagnosis not present

## 2018-09-11 DIAGNOSIS — C169 Malignant neoplasm of stomach, unspecified: Secondary | ICD-10-CM | POA: Diagnosis not present

## 2018-09-12 ENCOUNTER — Inpatient Hospital Stay (HOSPITAL_COMMUNITY): Payer: Medicare Other

## 2018-09-12 ENCOUNTER — Encounter (HOSPITAL_COMMUNITY): Payer: Self-pay | Admitting: Dietician

## 2018-09-12 DIAGNOSIS — G4700 Insomnia, unspecified: Secondary | ICD-10-CM | POA: Diagnosis not present

## 2018-09-12 DIAGNOSIS — Z9119 Patient's noncompliance with other medical treatment and regimen: Secondary | ICD-10-CM | POA: Diagnosis not present

## 2018-09-12 DIAGNOSIS — Z510 Encounter for antineoplastic radiation therapy: Secondary | ICD-10-CM | POA: Diagnosis not present

## 2018-09-12 DIAGNOSIS — C169 Malignant neoplasm of stomach, unspecified: Secondary | ICD-10-CM

## 2018-09-12 DIAGNOSIS — I714 Abdominal aortic aneurysm, without rupture: Secondary | ICD-10-CM | POA: Diagnosis not present

## 2018-09-12 DIAGNOSIS — Z8249 Family history of ischemic heart disease and other diseases of the circulatory system: Secondary | ICD-10-CM | POA: Diagnosis not present

## 2018-09-12 DIAGNOSIS — Z9221 Personal history of antineoplastic chemotherapy: Secondary | ICD-10-CM | POA: Diagnosis not present

## 2018-09-12 DIAGNOSIS — Z7982 Long term (current) use of aspirin: Secondary | ICD-10-CM | POA: Diagnosis not present

## 2018-09-12 DIAGNOSIS — Z79899 Other long term (current) drug therapy: Secondary | ICD-10-CM | POA: Diagnosis not present

## 2018-09-12 DIAGNOSIS — I129 Hypertensive chronic kidney disease with stage 1 through stage 4 chronic kidney disease, or unspecified chronic kidney disease: Secondary | ICD-10-CM | POA: Diagnosis not present

## 2018-09-12 DIAGNOSIS — K1230 Oral mucositis (ulcerative), unspecified: Secondary | ICD-10-CM | POA: Diagnosis not present

## 2018-09-12 DIAGNOSIS — Z4659 Encounter for fitting and adjustment of other gastrointestinal appliance and device: Secondary | ICD-10-CM | POA: Diagnosis not present

## 2018-09-12 DIAGNOSIS — N182 Chronic kidney disease, stage 2 (mild): Secondary | ICD-10-CM | POA: Diagnosis not present

## 2018-09-12 DIAGNOSIS — E871 Hypo-osmolality and hyponatremia: Secondary | ICD-10-CM | POA: Diagnosis not present

## 2018-09-12 DIAGNOSIS — R197 Diarrhea, unspecified: Secondary | ICD-10-CM | POA: Diagnosis not present

## 2018-09-12 LAB — COMPREHENSIVE METABOLIC PANEL
ALT: 18 U/L (ref 0–44)
AST: 26 U/L (ref 15–41)
Albumin: 3.6 g/dL (ref 3.5–5.0)
Alkaline Phosphatase: 77 U/L (ref 38–126)
Anion gap: 8 (ref 5–15)
BUN: 22 mg/dL — ABNORMAL HIGH (ref 6–20)
CO2: 25 mmol/L (ref 22–32)
Calcium: 9.1 mg/dL (ref 8.9–10.3)
Chloride: 95 mmol/L — ABNORMAL LOW (ref 98–111)
Creatinine, Ser: 0.89 mg/dL (ref 0.61–1.24)
GFR calc Af Amer: >60 mL/min (ref >60)
GFR calc non Af Amer: >60 mL/min (ref >60)
Glucose, Bld: 143 mg/dL — ABNORMAL HIGH (ref 70–99)
Potassium: 4.5 mmol/L (ref 3.5–5.1)
Sodium: 128 mmol/L — ABNORMAL LOW (ref 135–145)
Total Bilirubin: 0.5 mg/dL (ref 0.3–1.2)
Total Protein: 6.7 g/dL (ref 6.5–8.1)

## 2018-09-12 LAB — CBC WITH DIFFERENTIAL/PLATELET
Abs Immature Granulocytes: 0.01 10*3/uL (ref 0.00–0.07)
Basophils Absolute: 0 10*3/uL (ref 0.0–0.1)
Basophils Relative: 0 %
EOS PCT: 4 %
Eosinophils Absolute: 0.1 10*3/uL (ref 0.0–0.5)
HCT: 31.5 % — ABNORMAL LOW (ref 39.0–52.0)
Hemoglobin: 10.3 g/dL — ABNORMAL LOW (ref 13.0–17.0)
Immature Granulocytes: 0 %
Lymphocytes Relative: 9 %
Lymphs Abs: 0.3 10*3/uL — ABNORMAL LOW (ref 0.7–4.0)
MCH: 28.9 pg (ref 26.0–34.0)
MCHC: 32.7 g/dL (ref 30.0–36.0)
MCV: 88.2 fL (ref 80.0–100.0)
Monocytes Absolute: 0.4 10*3/uL (ref 0.1–1.0)
Monocytes Relative: 12 %
Neutro Abs: 2.9 10*3/uL (ref 1.7–7.7)
Neutrophils Relative %: 75 %
Platelets: 118 10*3/uL — ABNORMAL LOW (ref 150–400)
RBC: 3.57 MIL/uL — ABNORMAL LOW (ref 4.22–5.81)
RDW: 32.2 % — ABNORMAL HIGH (ref 11.5–15.5)
WBC: 3.8 10*3/uL — ABNORMAL LOW (ref 4.0–10.5)
nRBC: 0 % (ref 0.0–0.2)

## 2018-09-12 NOTE — Progress Notes (Deleted)
Nutrition Follow-up 57 y/o male PMHx CAD, CKD2, PVD, CVA, Tobacco abuse, HTN/HLD. Developed acute epigastric pain w/ n/v and globus sensation Dec 2018. EGD would later show GEJ ulcers, s/p biopsy + for gastric carcinoma. Underwent 4 cycles of neoadjuvant FLOT chemo regimen. Pt initially decided to forego recommended surg intervention. 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 After he completed adjuvant IV chemotherapy, he begun oral chemo. Begun adjuvant radiation 11/5. Completing both oral chemo and radiation today (12/17).   Pt was unable to make his appointment today due to having his final radiation treatment today. He did come briefly for lab work though. His Sodium was noted to be 128, down from 132 2 weeks ago. MD notes he should be finishing up with his xeloda treatment.   RD called and spoke with pt over phone. He says he changed his rate of TF because it was taking too long to run. He says it wasn't finishing until 9 am which was too late for him. He says he has gone back to running his feed at 65 cc/day from 5pm-7 am.   With the promod, This provides him ~1465 kcals, 67g Pro and 693 ml fluid. (+180 from Flushes)    He denies any diarrhea. He says he WAS having diarrhea, but it stopped when he started taking imodium (prescribed on 12/3).   He says he is eating/drinking well and denies any changes in the amount he is taking by mouth.   He says his wt is 172 lbs, which would be a regain of 5 lbs from 2 weeks ago. When last seen, he had lost 4-5 pounds, but he also had lost his J tube access for several days.   Wt Readings from Last 10 Encounters:  08/29/18 167 lb 8 oz (76 kg)  08/27/18 171 lb (77.6 kg)  08/15/18 172 lb 12.8 oz (78.4 kg)  07/24/18 169 lb 1.6 oz (76.7 kg)  07/13/18 173 lb (78.5 kg)  07/11/18 174 lb 3.2 oz (79 kg)  07/05/18 173 lb 8 oz (78.7 kg)  07/05/18 178 lb 9.2 oz (81 kg)  07/02/18 178 lb 9.2 oz (81 kg)   06/20/18 177 lb 9.6 oz (80.6 kg)   MEDICATIONS:  Chemotherapy: Xeloda Other: Thorazine, Ensure Enlive, Zofran, Compazine, Oxycodone, Robaxin, imodium ordered 12/3  LABS: Na: 128 down from 133 2 weeks ago, BG:143   Recent Labs  Lab 09/12/18 0930  NA 128*  K 4.5  CL 95*  CO2 25  BUN 22*  CREATININE 0.89  CALCIUM 9.1  GLUCOSE 143*     ANTHROPOMETRICS: Height:  Ht Readings from Last 1 Encounters:  08/27/18 6' 1"  (1.854 m)   Weight:  Wt Readings from Last 1 Encounters:  08/29/18 167 lb 8 oz (76 kg)   BMI:  BMI Readings from Last 1 Encounters:  08/29/18 22.10 kg/m   UBW: 200-205 lbs IBW: 184 lbs or 83.64 kg  Wt changes (as of 12/3): -5.25  lbs x 2 weeks - 5 lbs x 3 months  - 17 lbs x 6 months  - 32 lbs x 1 yr (199 Dec 2018)    ESTIMATED ENERGY NEEDS:  Kcal: 2300-2500 (30- 33 kcal/kg bw) Protein:100-115g Pro (1.3-1.5g/kg bw) Fluid: >2.3 L fluid (30 ml/kg bw)  NUTRITION DIAGNOSIS:  Increased protein/kcal needs related to cancer and cancer related treatments as evidenced by the estimated nutritional requirements for this condition  Ongoing  DOCUMENTATION CODES:  Not applicable  INTERVENTION:   Pt DECREASED  his tube feeding rate on his own volition. He says he did this because the higher rate was taking too long? However, decreasing the rate would make the feeding take longer? Regardless, he sounds to be cutting it off after 14 hrs.  He reports that he is eating/drinking well and not having any adverse affects from his chemoradiation. He says his diarrhea is controlled with imodium and that he has regained weight since he was last seen by RD.   Will continue to monitor weight closely, as this is really the only feedback tool that RD has available given his unreliable oral reporting.   Plan of care discussed with MD. MD notes she may try to see him in person next week. If so, RD will see him then.   GOAL:   Oral intake to meet >90% of estimate needs  Wt  stability vs gain  Patient will demonstrate compliance w/ TF  Unknown if met  MONITOR:  Weight, TF tolerance, oral diet tolerance, tolerance to chemotherapy/radiation  Ongoing  Next Visit: TBD-pending schedule descisions  [name] RD, LDN, CNSC Clinical Nutrition Available Tues-Sat via Pager: [phone] 09/12/2018 10:26 AM

## 2018-09-12 NOTE — Progress Notes (Signed)
Nutrition Follow-up 57 y/o male PMHx CAD, CKD2, PVD, CVA, Tobacco abuse, HTN/HLD. Developed acute epigastric pain w/ n/v and globus sensation Dec 2018. EGD would later show GEJ ulcers, s/p biopsy + for gastric carcinoma. Underwent 4 cycles of neoadjuvant FLOT chemo regimen. Pt initially decided to forego recommended surg intervention. 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 After he completed adjuvant IV chemotherapy, he begun oral chemo. Begun adjuvant radiation 11/5. Completing both oral chemo and radiation today (12/17).   Pt was unable to make his appointment today due to having his final radiation treatment today. He did come briefly for lab work though. His Sodium was noted to be 128, down from 132 2 weeks ago. MD notes he should be finishing up with his xeloda treatment.   RD called and spoke with pt over phone. He says he changed his rate of TF because it was taking too long to run. He says it wasn't finishing until 9 am which was too late for him. He says he has gone back to running his feed at 65 cc/day from 5pm-7 am.   With the promod, This provides him ~1465 kcals, 67g Pro and 693 ml fluid. (+180 from Flushes)    He denies any diarrhea. He says he WAS having diarrhea, but it stopped when he started taking imodium (prescribed on 12/3).   He says he is eating/drinking well and denies any changes in the amount he is taking by mouth.   He says his wt is 172 lbs, which would be a regain of 5 lbs from 2 weeks ago. When last seen, he had lost 4-5 pounds, but he also had lost his J tube access for several days.   Wt Readings from Last 10 Encounters:  08/29/18 167 lb 8 oz (76 kg)  08/27/18 171 lb (77.6 kg)  08/15/18 172 lb 12.8 oz (78.4 kg)  07/24/18 169 lb 1.6 oz (76.7 kg)  07/13/18 173 lb (78.5 kg)  07/11/18 174 lb 3.2 oz (79 kg)  07/05/18 173 lb 8 oz (78.7 kg)  07/05/18 178 lb 9.2 oz (81 kg)  07/02/18 178 lb 9.2 oz (81 kg)   06/20/18 177 lb 9.6 oz (80.6 kg)   MEDICATIONS:  Chemotherapy: Xeloda Other: Thorazine, Ensure Enlive, Zofran, Compazine, Oxycodone, Robaxin, imodium ordered 12/3  LABS: Na: 128 down from 133 2 weeks ago, BG:143   Recent Labs  Lab 09/12/18 0930  NA 128*  K 4.5  CL 95*  CO2 25  BUN 22*  CREATININE 0.89  CALCIUM 9.1  GLUCOSE 143*     ANTHROPOMETRICS: Height:  Ht Readings from Last 1 Encounters:  08/27/18 6' 1"  (1.854 m)   Weight:  Wt Readings from Last 1 Encounters:  08/29/18 167 lb 8 oz (76 kg)   BMI:  BMI Readings from Last 1 Encounters:  08/29/18 22.10 kg/m   UBW: 200-205 lbs IBW: 184 lbs or 83.64 kg  Wt changes (as of 12/3): -5.25  lbs x 2 weeks - 5 lbs x 3 months  - 17 lbs x 6 months  - 32 lbs x 1 yr (199 Dec 2018)    ESTIMATED ENERGY NEEDS:  Kcal: 2300-2500 (30- 33 kcal/kg bw) Protein:100-115g Pro (1.3-1.5g/kg bw) Fluid: >2.3 L fluid (30 ml/kg bw)  NUTRITION DIAGNOSIS:  Increased protein/kcal needs related to cancer and cancer related treatments as evidenced by the estimated nutritional requirements for this condition  Ongoing  DOCUMENTATION CODES:  Not applicable  INTERVENTION:   Pt DECREASED  his tube feeding rate on his own volition. He says he did this because the higher rate was taking too long? However, decreasing the rate would make the feeding take longer? Regardless, he sounds to be cutting it off after 14 hrs.  He reports that he is eating/drinking well and not having any adverse affects from his chemoradiation. He says his diarrhea is controlled with imodium and that he has regained weight since he was last seen by RD.   Will continue to monitor weight closely, as this is really the only feedback tool that RD has available given his unreliable oral reporting.   Plan of care discussed with MD. MD notes she may try to see him in person next week. If so, RD will see him then.   GOAL:   Oral intake to meet >90% of estimate needs  Wt  stability vs gain  Patient will demonstrate compliance w/ TF  Unknown if met  MONITOR:  Weight, TF tolerance, oral diet tolerance, tolerance to chemotherapy/radiation  Ongoing  Next Visit: TBD-pending schedule descisions  [name] RD, LDN, CNSC Clinical Nutrition Available Tues-Sat via Pager: [phone] 09/12/2018 10:26 AM

## 2018-09-15 ENCOUNTER — Other Ambulatory Visit (HOSPITAL_COMMUNITY): Payer: Self-pay | Admitting: Internal Medicine

## 2018-09-15 DIAGNOSIS — C160 Malignant neoplasm of cardia: Secondary | ICD-10-CM | POA: Diagnosis not present

## 2018-09-15 DIAGNOSIS — C169 Malignant neoplasm of stomach, unspecified: Secondary | ICD-10-CM

## 2018-09-18 ENCOUNTER — Inpatient Hospital Stay (HOSPITAL_BASED_OUTPATIENT_CLINIC_OR_DEPARTMENT_OTHER): Payer: Medicare Other | Admitting: Internal Medicine

## 2018-09-18 ENCOUNTER — Inpatient Hospital Stay (HOSPITAL_COMMUNITY): Payer: Medicare Other | Attending: Hematology

## 2018-09-18 ENCOUNTER — Encounter (HOSPITAL_COMMUNITY): Payer: Self-pay | Admitting: Internal Medicine

## 2018-09-18 ENCOUNTER — Other Ambulatory Visit: Payer: Self-pay

## 2018-09-18 VITALS — HR 112 | Temp 98.4°F | Resp 18 | Wt 168.3 lb

## 2018-09-18 DIAGNOSIS — I714 Abdominal aortic aneurysm, without rupture: Secondary | ICD-10-CM

## 2018-09-18 DIAGNOSIS — K1230 Oral mucositis (ulcerative), unspecified: Secondary | ICD-10-CM | POA: Diagnosis not present

## 2018-09-18 DIAGNOSIS — Z8249 Family history of ischemic heart disease and other diseases of the circulatory system: Secondary | ICD-10-CM | POA: Diagnosis not present

## 2018-09-18 DIAGNOSIS — Z9119 Patient's noncompliance with other medical treatment and regimen: Secondary | ICD-10-CM

## 2018-09-18 DIAGNOSIS — N182 Chronic kidney disease, stage 2 (mild): Secondary | ICD-10-CM | POA: Diagnosis not present

## 2018-09-18 DIAGNOSIS — I129 Hypertensive chronic kidney disease with stage 1 through stage 4 chronic kidney disease, or unspecified chronic kidney disease: Secondary | ICD-10-CM

## 2018-09-18 DIAGNOSIS — R197 Diarrhea, unspecified: Secondary | ICD-10-CM | POA: Diagnosis not present

## 2018-09-18 DIAGNOSIS — Z7982 Long term (current) use of aspirin: Secondary | ICD-10-CM

## 2018-09-18 DIAGNOSIS — E871 Hypo-osmolality and hyponatremia: Secondary | ICD-10-CM

## 2018-09-18 DIAGNOSIS — G4700 Insomnia, unspecified: Secondary | ICD-10-CM | POA: Diagnosis not present

## 2018-09-18 DIAGNOSIS — C169 Malignant neoplasm of stomach, unspecified: Secondary | ICD-10-CM | POA: Diagnosis not present

## 2018-09-18 DIAGNOSIS — Z4659 Encounter for fitting and adjustment of other gastrointestinal appliance and device: Secondary | ICD-10-CM | POA: Diagnosis not present

## 2018-09-18 DIAGNOSIS — Z9221 Personal history of antineoplastic chemotherapy: Secondary | ICD-10-CM

## 2018-09-18 DIAGNOSIS — Z79899 Other long term (current) drug therapy: Secondary | ICD-10-CM

## 2018-09-18 DIAGNOSIS — F17290 Nicotine dependence, other tobacco product, uncomplicated: Secondary | ICD-10-CM

## 2018-09-18 LAB — COMPREHENSIVE METABOLIC PANEL WITH GFR
ALT: 18 U/L (ref 0–44)
AST: 25 U/L (ref 15–41)
Albumin: 3.6 g/dL (ref 3.5–5.0)
Alkaline Phosphatase: 86 U/L (ref 38–126)
Anion gap: 8 (ref 5–15)
BUN: 18 mg/dL (ref 6–20)
CO2: 25 mmol/L (ref 22–32)
Calcium: 9.5 mg/dL (ref 8.9–10.3)
Chloride: 96 mmol/L — ABNORMAL LOW (ref 98–111)
Creatinine, Ser: 0.83 mg/dL (ref 0.61–1.24)
GFR calc Af Amer: >60 mL/min
GFR calc non Af Amer: >60 mL/min
Glucose, Bld: 116 mg/dL — ABNORMAL HIGH (ref 70–99)
Potassium: 4.1 mmol/L (ref 3.5–5.1)
Sodium: 129 mmol/L — ABNORMAL LOW (ref 135–145)
Total Bilirubin: 0.7 mg/dL (ref 0.3–1.2)
Total Protein: 7 g/dL (ref 6.5–8.1)

## 2018-09-18 LAB — CBC WITH DIFFERENTIAL/PLATELET
Abs Immature Granulocytes: 0.01 10*3/uL (ref 0.00–0.07)
BASOS PCT: 1 %
Basophils Absolute: 0 10*3/uL (ref 0.0–0.1)
EOS ABS: 0.1 10*3/uL (ref 0.0–0.5)
Eosinophils Relative: 2 %
HCT: 32.8 % — ABNORMAL LOW (ref 39.0–52.0)
Hemoglobin: 10.6 g/dL — ABNORMAL LOW (ref 13.0–17.0)
Immature Granulocytes: 0 %
Lymphocytes Relative: 19 %
Lymphs Abs: 0.8 10*3/uL (ref 0.7–4.0)
MCH: 29 pg (ref 26.0–34.0)
MCHC: 32.3 g/dL (ref 30.0–36.0)
MCV: 89.9 fL (ref 80.0–100.0)
Monocytes Absolute: 0.6 10*3/uL (ref 0.1–1.0)
Monocytes Relative: 14 %
Neutro Abs: 2.7 10*3/uL (ref 1.7–7.7)
Neutrophils Relative %: 64 %
Platelets: 130 10*3/uL — ABNORMAL LOW (ref 150–400)
RBC: 3.65 MIL/uL — ABNORMAL LOW (ref 4.22–5.81)
WBC: 4.3 10*3/uL (ref 4.0–10.5)
nRBC: 0 % (ref 0.0–0.2)

## 2018-09-18 LAB — LACTATE DEHYDROGENASE: LDH: 181 U/L (ref 98–192)

## 2018-09-18 MED ORDER — MORPHINE SULFATE ER 15 MG PO TBCR: 15.0000 mg | EXTENDED_RELEASE_TABLET | Freq: Two times a day (BID) | ORAL | 0 refills | Status: AC

## 2018-09-18 MED ORDER — FIRST-DUKES MOUTHWASH MT SUSP: OROMUCOSAL | 1 refills | Status: AC

## 2018-09-18 MED ORDER — MORPHINE SULFATE 15 MG PO TABS: 15.0000 mg | ORAL_TABLET | ORAL | 0 refills | Status: AC | PRN

## 2018-09-18 NOTE — Patient Instructions (Signed)
[facility] at [facility]  Discharge Instructions: You saw Dr. Higgs today                               _______________________________________________________________  Thank you for choosing [facility] at [facility] to provide your oncology and hematology care.  To afford each patient quality time with our providers, please arrive at least 15 minutes before your scheduled appointment.  You need to re-schedule your appointment if you arrive 10 or more minutes late.  We strive to give you quality time with our providers, and arriving late affects you and other patients whose appointments are after yours.  Also, if you no show three or more times for appointments you may be dismissed from the clinic.  Again, thank you for choosing [facility] at [facility]. Our hope is that these requests will allow you access to exceptional care and in a timely manner. _______________________________________________________________  If you have questions after your visit, please contact our office at [phone] between the hours of 8:30 a.m. and 5:00 p.m. Voicemails left after 4:30 p.m. will not be returned until the following business day. _______________________________________________________________  For prescription refill requests, have your pharmacy contact our office. _______________________________________________________________  Recommendations made by the consultant and any test results will be sent to your referring physician. _______________________________________________________________ 

## 2018-09-18 NOTE — Progress Notes (Signed)
Diagnosis Gastric carcinoma ([place]) - Plan: CBC with Differential/Platelet, Comprehensive metabolic panel, Lactate dehydrogenase  Staging Cancer Staging No matching staging information was found for the patient.  Assessment and Plan:  1.  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  57 y.o. male initially seen by Dr. Sherrine Maples and reported he was in his usual state of health until around Christmas 2018 when he went camping in [place].  During that trip patient apparently had acute onset of epigastric pain associated with vomiting.  He reports retching with vomiting.  Sometime during that episode patient apparently attempted to put his fingers in the back of his throat to relieve the sensation of food being lodged thinking that he may have had a fish bone that was stuck in his throat.  This culminated in vomiting with small amount of blood.  He subsequently had relief from vomiting but he continued to have difficulty swallowing and hoarseness of voice.  Imaging of the neck showed posterior pharyngeal edema without evidence of any foreign object or fishbone.  The pain subsequently improved gradually.    He underwent a colonoscopy that showed tubular adenomas.  An EGD that revealed diffusely infiltrated wall of the stomach with superficial ulcerations.  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    PET scan was done 10/24/2017 and showed IMPRESSION: 1. Small focus of hypermetabolism along the lesser curvature of the antrum of the stomach, corresponding to recently diagnosed gastric neoplasm. No associated lymphadenopathy or definite findings to suggest metastatic disease to the neck, chest, abdomen or pelvis. 2. Aortic atherosclerosis, in addition to left main and 3 vessel coronary artery disease. Please note that although the presence of coronary artery calcium documents the presence of coronary artery disease,  the severity of this disease and any potential stenosis cannot be assessed on this non-gated CT examination. Assessment for potential risk factor modification, dietary therapy or pharmacologic therapy may be warranted, if clinically indicated. 3. In addition, there is fusiform aneurysmal dilatation of the infrarenal abdominal aorta which measures up to 3.9 x 3.7 cm. Recommend followup by ultrasound in 2 years. This recommendation follows ACR consensus guidelines: White Paper of the ACR Incidental Findings Committee II on Vascular Findings. J Am Coll Radiol 2013; 10:789-794.  He Is followed by Dr. Barry Dienes of surgery.  He initially indicated he did not desire surgery or chemotherapy.   Pt has diffuse gastric adenocarcinoma that appears to be localized to the stomach.  Histologically he appears to have a signet ring variety with diffuse involvement.  He was treated with neoadjuvant chemotherapy followed by surgery followed by adjuvant chemotherapy.  Previously, I have discussed with him that even if he were to be treated with surgery alone based on the diffuse nature of his cancer he was still be recommended for adjuvant therapy.    He was treated with FLOT regimen (5-FU dose at 2600 mg/m with 24 hours, oxaliplatin 85 mg/m, leucovorin 200 mg/m, and Taxotere 50 mg/m all on day 1 every 2 weeks for 4 cycles before surgery and 4 cycles following surgery.   PET scan done 10/24/2017 showed no evidence of distant disease.    EUS that was done 11/24/2017 showed:  Normal esophagus, ulcerated mass involving the lesser curvature of the stomach approximately mid body.  Mass measured 5 cm across 5 cm in length.  Mass appeared to be 7- 8 cm from the pylorus.  It was also 5 to 6 cm  from the GE junction.  It appeared to pass into and through the muscularis propria as well as the serosa making it a T4 a lesion.  Mass was 1.6 cm in maximal thickness.  No adenopathy was noted.  He was staged as a T4 a N0 by EUS.     He has undergone genetic testing that was negative.     He has completed 4 cycles of FLOT.  Pt failed to keep his follow-up to go over PET scan and was referred to Dr. Barry Dienes for surgical evaluation.  He was planned for surgery on 04/26/2018.    Pt cancelled surgery and wanted  to wait until next year for operation.  He was also reporting port problems.    Pet scan done 03/20/2018 showed IMPRESSION: 1. No evidence of residual gastric carcinoma. 2. No evidence of carcinoma metastasis in the chest, abdomen, or pelvis. 3. Coronary artery calcification and Aortic Atherosclerosis (ICD10-I70.0).  Previously I discussed with him scan findings are due to chemotherapy and if he remains off treatment for a significant amount of time he has a significant risk for morbidity and mortality if he continues to be noncompliant with recommendations.  I have discussed with him he has a significant risk for death from advanced gastric cancer if he continues to defer surgery and adjuvant chemotherapy especially until next year which is what he was requesting.    After discussion he continued to defer surgery until after 04/29/2018.   Pt underwent total gastrectomy on 05/04/2018 Pathology returned as  1. Stomach, resection for tumor - INVASIVE ADENOCARCINOMA, POORLY DIFFERENTIATED, SPANNING AT LEAST 3.2 CM. - ADENOCARCINOMA IS PRESENT AT THE PROXIMAL MARGIN OF SPECIMEN 1. - LYMPHOVASCULAR INVASION IS IDENTIFIED. - PERINEURAL INVASION IS IDENTIFIED. - ADENOCARICNOMA EXTENDS THROUGH THE MUSCULARIS PROPRIA AND INTO SOFT TISSUE WITH PERITONEAL INVOLVEMENT. - METASTATIC CARCINOMA IN 6 OF 6 LYMPH NODES (6/6). - SEE ONCOLOGY TABLE BELOW. 2. Stomach, resection, Additional proximal margin - INVASIVE ADENOCARCINOMA, PRESENT AT THE PROXIMAL MARGIN OF SPECIMEN 2. - METASTATIC CARCINOMA IN 1 OF 4 LYMPH NODES (1/4). 3. Stomach, resection, additional proximal margin - INVASIVE ADENOCARCINOMA, PRESENT AT THE PROXIMAL MARGIN  OF SPECIMEN 3. 4. Stomach, resection, Additional proximal margin - INVASIVE ADENOCARCINOMA, PRESENT AT THE PROXIMAL MARGIN OF SPECIMEN 4. Microscopic Comment  I discussed with the patient that due to + margin and numerous LN he is recommended for adjuvant chemotherapy and RT.  He initially refused RT but after discussion he is willing to meet with RT for evaluation.    Based on results of CALGB study [id number] pts with resected gastric cancer who received postoperative chemoRT with ECF compared to 5FU based regimen did not show improved survival compared to 5FU before and after RT.  A recent study also showed that use of Xeloda with cisplatin followed by RT with Xeloda was better tolerated and had less toxicity than 66f.  Xeloda is comparable to 5FU with RT.  I discussed with him option of 2 cycles of Xeloda dosed at 1000 mg/m2 bid for 14 days with cisplatin 75 mg/m2 on D1 every 3 weeks followed by Xeloda with RT followed by an additional 1 or 2 cycles of XP.  XP had no difference in survival compared to 5 FU/LV.    ChemoRT demonstrated survival advantage over surgery alone in [id number]-[id number] study.  Pt is recommended for chemo/RT.  Pt does not desire multiple days of chemotherapy.  I have discussed with him option of XP initially followed by Xeloda with RT.  Pt begun RT on 08/01/2018 and was treated with Xeloda  825 mg bid Monday through Friday during RT.  Total dose 1500 mg po bid M-F during RT.   Pt completed RT last week.    Labs done 09/18/2018 reviewed and showed WBC WBC 4.3 HB 10.6 Plts 130,000.  Chemistries WNl with K+ 4.1 Cr 0.83 and normal LFTs.    I have discussed with him he will resume XP in 09/2018 to allow some time for RT recovery.  Anticipate 1-2 additional cycles pending tolerance and repeat imaging.  Xeloda will be dosed at 1000 mg/m2 bid for 14 days with Cisplatin 75 mg/m2 on D1 every 3 weeks.    All questions answered and he expressed understanding of the information presented.    2.   Mucositis.  Magic mouthwash with lidocaine RX'd.  Pt also started on MS Contin 15 mg po bid with MSIR 15 mg po q 4 hours prn breakthrough.  Will assess if symptoms improve as he reports Oxycodone was not helping pain.  I have also instructed him to avoid sodas and use feeding tube currently more now for nutrition.   3.  Insomnia.  Pt recommended for OTC sleep aids.  Will determine if sleep improves with pain medication change.    4  Hyponatremia.  Sodium level decreased at 129.  Pt completed RT and Xeloda last week.  Will repeat labs in 1-2 weeks for ongoing follow-up.    5.  Hypertension.  BP 118/96.  Follow-up with PCP.  6.  Smoking.  Cessation is recommended.  PET scan showed no clear lung abnormalities.  7.  Abdominal aortic aneurysm.  This was noted on PET scan.  Patient was also noted to have atherosclerosis on recent PET scan.  He has been seen by vascular surgery and cardiology in the past and was on aspirin and statins.  8.  Compliance with treatment recommendations.  This has been a problem with Mr. Todd [name].  He has delayed surgery and had some compliance issues with follow-up. Pt was also not taking Xeloda as prescribed.    9.  Diarrhea.  Imodium prn.    10.  Pain.  Pain regimen changed to MS Contin 15 mg po bid for longer acting medication and MSIR 15 mg po q 4 hrs prn for breakthrough.  Will assess improvement with medication change.    11.  Feeding tube.  Pt had new feeding tube placed after original was pulled out.    25 minutes spent with more than 50% spent in counseling and coordination of care.     Interval History:  As above.   Current Status:  Pt is seen today for follow-up.  He has completed RT.  He is complaining of sore throat.  He also reports pain meds are not working and he has difficulty sleeping.     Malignant neoplasm of stomach ([place])   11/11/2017 Initial Diagnosis    Malignant neoplasm of stomach ([place])    12/16/2017 Genetic Testing    Negative genetic  testing on the common hereditary cancer panel.  The Hereditary Gene Panel offered by [facility] includes sequencing and/or deletion duplication testing of the following 47 genes: APC, ATM, AXIN2, BARD1, BMPR1A, BRCA1, BRCA2, BRIP1, CDH1, CDK4, CDKN2A (p14ARF), CDKN2A (p16INK4a), CHEK2, CTNNA1, DICER1, EPCAM (Deletion/duplication testing only), GREM1 (promoter region deletion/duplication testing only), KIT, MEN1, MLH1, MSH2, MSH3, MSH6, MUTYH, NBN, NF1, NHTL1, PALB2, PDGFRA, PMS2, POLD1, POLE, PTEN, RAD50, RAD51C, RAD51D, SDHB, SDHC, SDHD, SMAD4, SMARCA4. STK11, TP53, TSC1,  TSC2, and VHL.  The following genes were evaluated for sequence changes only: SDHA and HOXB13 c.251G>A variant only. The report date is December 16, 2017.      Gastric carcinoma ([place])   01/23/2018 Initial Diagnosis    Gastric carcinoma ([place])    01/26/2018 - 03/27/2018 Chemotherapy    The patient had palonosetron (ALOXI) injection 0.25 mg, 0.25 mg, Intravenous,  Once, 4 of 4 cycles Administration: 0.25 mg (01/31/2018), 0.25 mg (02/14/2018), 0.25 mg (02/28/2018), 0.25 mg (03/14/2018) pegfilgrastim (NEULASTA ONPRO KIT) injection 6 mg, 6 mg, Subcutaneous, Once, 4 of 4 cycles Administration: 6 mg (02/01/2018), 6 mg (03/01/2018) leucovorin 400 mg in dextrose 5 % 250 mL infusion, 428 mg, Intravenous,  Once, 4 of 4 cycles Administration: 400 mg (01/31/2018), 400 mg (02/14/2018), 400 mg (02/28/2018), 400 mg (03/14/2018) oxaliplatin (ELOXATIN) 180 mg in dextrose 5 % 500 mL chemo infusion, 85 mg/m2 = 180 mg, Intravenous,  Once, 4 of 4 cycles Administration: 180 mg (01/31/2018), 180 mg (02/14/2018), 180 mg (02/28/2018), 180 mg (03/14/2018) DOCEtaxel (TAXOTERE) 110 mg in sodium chloride 0.9 % 250 mL chemo infusion, 50 mg/m2 = 110 mg, Intravenous,  Once, 4 of 4 cycles Administration: 110 mg (01/31/2018), 110 mg (02/14/2018), 110 mg (02/28/2018), 110 mg (03/14/2018) fluorouracil (ADRUCIL) 5,550 mg in sodium chloride 0.9 % 139 mL chemo infusion, 2,600 mg/m2 = 5,550 mg, Intravenous,  1 Day/Dose, 4 of 4 cycles Administration: 5,550 mg (01/31/2018), 5,550 mg (02/14/2018), 5,550 mg (02/28/2018), 5,550 mg (03/14/2018)  for chemotherapy treatment.     06/19/2018 -  Chemotherapy    The patient had palonosetron (ALOXI) injection 0.25 mg, 0.25 mg, Intravenous,  Once, 2 of 2 cycles Administration: 0.25 mg (06/20/2018), 0.25 mg (07/11/2018) CISplatin (PLATINOL) 150 mg in sodium chloride 0.9 % 500 mL chemo infusion, 75 mg/m2 = 150 mg (100 % of original dose 75 mg/m2), Intravenous,  Once, 2 of 2 cycles Dose modification: 75 mg/m2 (original dose 75 mg/m2, Cycle 1, Reason: Other (see comments)) Administration: 150 mg (06/20/2018), 150 mg (07/11/2018) fosaprepitant (EMEND) 150 mg, dexamethasone (DECADRON) 12 mg in sodium chloride 0.9 % 145 mL IVPB, , Intravenous,  Once, 2 of 2 cycles Administration:  (06/20/2018),  (07/11/2018)  for chemotherapy treatment.       Problem List Patient Active Problem List   Diagnosis Date Noted  . Elevated lipase [R74.8] 03/17/2018  . Hypotension [I95.9] 03/17/2018  . Leukocytosis [D72.829] 03/17/2018  . Gastric carcinoma ([place]) [C16.9] 01/23/2018  . Genetic testing [Z13.79] 12/20/2017  . Family history of cancer [Z80.9]   . IBS (irritable bowel syndrome) [K58.9] 11/28/2017  . Malignant neoplasm of stomach ([place]) [C16.9] 11/11/2017  . Constipation [K59.00] 08/30/2017  . GERD (gastroesophageal reflux disease) [K21.9] 08/30/2017  . Globus sensation [R09.89] 08/30/2017  . Cerebral artery occlusion with cerebral infarction ([place]) [I63.50] 12/20/2016  . PAD (peripheral artery disease) ([place]) [I73.9] 08/09/2014  . CAD (coronary artery disease) [I25.10] 07/22/2014  . Tobacco abuse [Z72.0] 07/22/2014  . 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 Pure hypercholesterolemia [E78.00] 05/28/2014  . Hypertension [I10] 07/19/2012  . Hyperlipidemia [E78.5] 07/19/2012  . PVD (peripheral vascular disease) with claudication ([place]) [I73.9] 06/29/2012  .  Atherosclerosis of native arteries of the extremities with intermittent claudication [I70.219] 04/20/2012    Past Medical History Past Medical History:  Diagnosis Date  . CAD (coronary artery disease)    a. Cath 07/2012 following abnormal nuclear stress test: 20-30% LAD, 20-30% OM1, occluded branch off OM2 that fills slowly via collaterals, small nondominant RCA occluded  proximally, fills slowly via collaterals.  . CKD (chronic kidney disease), stage II   . Family history of cancer   . Gastric adenocarcinoma ([place])   . History of blood transfusion    "when I was a child"  . Hyperlipidemia   . Hypertension   . Peripheral vascular disease ([place])    a. bilateral superficial femoral artery stents in May/August of 2012. b. Left femoral to above-knee popliteal bypass in 08/2012.  Marland Kitchen Plantar fasciitis   . Renal insufficiency   . Stroke [facility])    2008 right side weakness  . Tobacco abuse     Past Surgical History Past Surgical History:  Procedure Laterality Date  . ABDOMINAL AORTAGRAM N/A 04/27/2012   Procedure: ABDOMINAL Maxcine Ham;  Surgeon: Conrad Hickman, MD;  Location: [facility] CATH LAB;  Service: Cardiovascular;  Laterality: N/A;  . ABDOMINAL AORTAGRAM N/A 08/09/2014   Procedure: ABDOMINAL Maxcine Ham;  Surgeon: Elam Dutch, MD;  Location: [facility] CATH LAB;  Service: Cardiovascular;  Laterality: N/A;  . BIOPSY  10/06/2017   Procedure: BIOPSY;  Surgeon: Daneil Dolin, MD;  Location: [facility] ENDO SUITE;  Service: Endoscopy;;  gastric   . CARDIAC CATHETERIZATION    . COLONOSCOPY    . COLONOSCOPY WITH PROPOFOL N/A 10/06/2017   Procedure: COLONOSCOPY WITH PROPOFOL;  Surgeon: Daneil Dolin, MD;  Location: [facility] ENDO SU[facility];  Service: Endoscopy;  Laterality: N/A;  1:45pm - patient can't come earlier due to transportation  . ESOPHAGOGASTRODUODENOSCOPY (EGD) WITH PROPOFOL N/A 10/06/2017   Procedure: ESOPHAGOGASTRODUODENOSCOPY (EGD) WITH PROPOFOL;  Surgeon: Daneil Dolin, MD;  [facility] END[facility];  Service:  Endoscopy;  Laterality: N/A;  . EUS N/A 11/24/2017   Procedure: UPPER ENDOSCOPIC ULTRASOUND (EUS) RADIAL;  Surgeon: Milus Banister, MD;  Location: [facility] ENDOSCOPY;  Service: Endoscopy;  Laterality: N/A;  . FEMORAL ARTERY - POPLITEAL ARTERY BYPASS GRAFT    . FEMORAL-POPLITEAL BYPASS GRAFT  09/26/2012   Procedure: BYPASS GRAFT FEMORAL-POPLITEAL ARTERY;  Surgeon: Elam Dutch, MD;  Location: [facility] OR;  Service: Vascular;  Laterality: Left;  using 50m Gore-Tex Propaten Ringed Graft, Vein patch on the proximal and distal anastomosis  . FEMORAL-POPLITEAL BYPASS GRAFT Right 08/12/2014   Procedure:  FEMORAL-POPLITEAL ARTERY BYPASS WITH SAPHENOUS VEIN GRAFT, INTRAOPERATIVE ARTERIOGRAM;  Surgeon: CElam Dutch MD;  [place]  Service: Vascular;  Laterality: Right;  . GASTRECTOMY N/A 05/04/2018   Procedure: Total Gastrectomy with Feeding Jejunostomy;  Surgeon: BStark Klein MD;  [place] [facility]  Service: General;  Laterality: N/A;  . INTRAOPERATIVE ARTERIOGRAM  08/12/2014   Procedure: INTRA OPERATIVE ARTERIOGRAM;  Surgeon: CElam Dutch MD;  [place] [facility]  Service: Vascular;;  . IR RNortonDUODEN/JEJUNO TUBE PERCUT WVivianne Master 08/29/2018  . LAPAROSCOPY N/A 05/04/2018   Procedure: LAPAROSCOPY DIAGNOSTIC ERAS PATHWAY;  Surgeon: BStark Klein MD;  [place] [facility]  Service: General;  Laterality: N/A;  EPIDURAL  . POLYPECTOMY  10/06/2017   Procedure: POLYPECTOMY;  Surgeon: RDaneil Dolin MD;  [facility];  Service: Endoscopy;;  splenic flexure,  ascending  . PORTACATH PLACEMENT N/A 01/24/2018   Procedure: INSERTION PORT-A-CATH;  Surgeon: BStark Klein MD;  [place]  Service: General;  Laterality: N/A;  . PR VEIN BYPASS GRAFT,AORTO-FEM-POP    . stents in bil legs      Family History Family History  Problem Relation Age of Onset  . Diabetes Mother   . Heart disease Mother        before age 55 Triple BPG  .  Cancer Mother        Luekemia  . Deep vein thrombosis Mother    . Hyperlipidemia Mother   . Hypertension Mother   . Heart attack Mother   . Peripheral vascular disease Mother   . Stroke Mother        x 3  . Pancreatic cancer Mother   . Arthritis Father   . Hypertension Father   . Kidney disease Father   . Cancer Maternal Grandmother        NOS  . Diabetes Cousin   . Cancer Cousin        NOS, pat first cousin  . Cancer Cousin        NOS, pat first cousin  . Colon cancer Neg Hx   . Gastric cancer Neg Hx   . Esophageal cancer Neg Hx      Social History  reports that he has been smoking cigars. He started smoking about 43 years ago. He has a 25.00 pack-year smoking history. He has never used smokeless tobacco. He reports current drug use. Drug: Marijuana. He reports that he does not drink alcohol.  Medications  Current Outpatient Medications:  .  aspirin EC 81 MG tablet, Take 81 mg by mouth daily., Disp: , Rfl:  .  chlorproMAZINE (THORAZINE) 10 MG tablet, Take 1 tablet (10 mg total) by mouth 4 (four) times daily as needed for hiccoughs., Disp: 30 tablet, Rfl: 1 .  CISPLATIN IV, Inject into the vein., Disp: , Rfl:  .  Diphenhyd-Hydrocort-Nystatin (FIRST-DUKES MOUTHWASH) SUSP, Swish and swallow 27m four times a day., Disp: 480 mL, Rfl: 1 .  feeding supplement, ENSURE ENLIVE, (ENSURE ENLIVE) LIQD, Take 237 mLs by mouth 2 (two) times daily between meals. (Patient taking differently: Take 237 mLs by mouth 2 (two) times daily between meals. Pt is taking 1 1/2 bottles 2 times daily), Disp: 237 mL, Rfl: 12 .  lidocaine-prilocaine (EMLA) cream, Apply to affected area once, Disp: 30 g, Rfl: 3 .  Loperamide HCl (IMODIUM PO), Take 1 tablet by mouth as needed (Pt takes 1 tablet every 2 days to prevent diarrhea)., Disp: , Rfl:  .  methocarbamol (ROBAXIN) 500 MG tablet, Take 1 tablet (500 mg total) by mouth every 6 (six) hours as needed for muscle spasms., Disp: 20 tablet, Rfl: 1 .  morphine (MS CONTIN) 15 MG 12 hr tablet, Take 1 tablet (15 mg total) by mouth  every 12 (twelve) hours., Disp: 60 tablet, Rfl: 0 .  morphine (MSIR) 15 MG tablet, Take 1 tablet (15 mg total) by mouth every 4 (four) hours as needed for severe pain., Disp: 90 tablet, Rfl: 0 .  Nutritional Supplements (FEEDING SUPPLEMENT, OSMOLITE 1.5 CAL,) LIQD, Place 1,000 mLs into feeding tube daily., Disp: 30 Bottle, Rfl: 0 .  ondansetron (ZOFRAN) 8 MG tablet, Take 1 tablet (8 mg total) by mouth 2 (two) times daily as needed. Start on the third day after chemotherapy., Disp: 30 tablet, Rfl: 1 .  prochlorperazine (COMPAZINE) 10 MG tablet, TAKE 1 TABLET BY MOUTH EVERY 6 HOURS AS NEEDED FOR NAUSEA OR VOMITING, Disp: 30 tablet, Rfl: 1  Allergies Potassium-containing compounds  Review of Systems Review of Systems - Oncology ROS negative other than insomnia and pain meds not working.     Physical Exam  Vitals Wt Readings from Last 3 Encounters:  09/18/18 168 lb 4.8 oz (76.3 kg)  08/29/18 167 lb 8 oz (76 kg)  08/27/18 171 lb (77.6 kg)   Temp Readings from Last 3  Encounters:  09/18/18 98.4 F (36.9 C) (Oral)  08/29/18 97.8 F (36.6 C) (Oral)  08/27/18 98.1 F (36.7 C) (Oral)   BP Readings from Last 3 Encounters:  08/29/18 (!) 118/96  08/27/18 (!) 151/98  08/15/18 133/88   Pulse Readings from Last 3 Encounters:  09/18/18 (!) 112  08/29/18 (!) 114  08/27/18 82   Constitutional: Well-developed, well-nourished, and in no distress.   HENT: Head: Normocephalic and atraumatic.  Mouth/Throat: No oropharyngeal exudate. Mucosa moist. Eyes: Pupils are equal, round, and reactive to light. Conjunctivae are normal. No scleral icterus.  Neck: Normal range of motion. Neck supple. No JVD present.  Cardiovascular: Normal rate, regular rhythm and normal heart sounds.  Exam reveals no gallop and no friction rub.   No murmur heard. Pulmonary/Chest: Effort normal and breath sounds normal. No respiratory distress. No wheezes.No rales.  Abdominal: Soft. Bowel sounds are normal. No distension.  There is no tenderness. There is no guarding. Feeding tube noted on abdominal wall.   Musculoskeletal: No edema or tenderness.  Lymphadenopathy: No cervical, axillary or supraclavicular adenopathy.  Neurological: Alert and oriented to person, place, and time. No cranial nerve deficit.  Skin: Skin is warm and dry. No rash noted. No erythema. No pallor.  Psychiatric: Affect and judgment normal.   Labs Appointment on 09/18/2018  Component Date Value Ref Range Status  . WBC 09/18/2018 4.3  4.0 - 10.5 K/uL Final  . RBC 09/18/2018 3.65* 4.22 - 5.81 MIL/uL Final  . Hemoglobin 09/18/2018 10.6* 13.0 - 17.0 g/dL Final  . HCT 09/18/2018 32.8* 39.0 - 52.0 % Final  . MCV 09/18/2018 89.9  80.0 - 100.0 fL Final  . MCH 09/18/2018 29.0  26.0 - 34.0 pg Final  . MCHC 09/18/2018 32.3  30.0 - 36.0 g/dL Final  . RDW 09/18/2018 Not Measured  11.5 - 15.5 % Final  . Platelets 09/18/2018 130* 150 - 400 K/uL Final   Comment: PLATELET COUNT CONFIRMED BY SMEAR SPECIMEN CHECKED FOR CLOTS Immature Platelet Fraction may be clinically indicated, consider ordering this additional test [id number]   . nRBC 09/18/2018 0.0  0.0 - 0.2 % Final  . Neutrophils Relative % 09/18/2018 64  % Final  . Neutro Abs 09/18/2018 2.7  1.7 - 7.7 K/uL Final  . Lymphocytes Relative 09/18/2018 19  % Final  . Lymphs Abs 09/18/2018 0.8  0.7 - 4.0 K/uL Final  . Monocytes Relative 09/18/2018 14  % Final  . Monocytes Absolute 09/18/2018 0.6  0.1 - 1.0 K/uL Final  . Eosinophils Relative 09/18/2018 2  % Final  . Eosinophils Absolute 09/18/2018 0.1  0.0 - 0.5 K/uL Final  . Basophils Relative 09/18/2018 1  % Final  . Basophils Absolute 09/18/2018 0.0  0.0 - 0.1 K/uL Final  . Immature Granulocytes 09/18/2018 0  % Final  . Abs Immature Granulocytes 09/18/2018 0.01  0.00 - 0.07 K/uL Final  . Dimorphism 09/18/2018 PRESENT   Final   Performed at [facility], [address] [place], [place] [address]  . Sodium 09/18/2018 129* 135 - 145 mmol/L Final   . Potassium 09/18/2018 4.1  3.5 - 5.1 mmol/L Final  . Chloride 09/18/2018 96* 98 - 111 mmol/L Final  . CO2 09/18/2018 25  22 - 32 mmol/L Final  . Glucose, Bld 09/18/2018 116* 70 - 99 mg/dL Final  . BUN 09/18/2018 18  6 - 20 mg/dL Final  . Creatinine, Ser 09/18/2018 0.83  0.61 - 1.24 mg/dL Final  . Calcium 09/18/2018 9.5  8.9 - 10.3  mg/dL Final  . Total Protein 09/18/2018 7.0  6.5 - 8.1 g/dL Final  . Albumin 09/18/2018 3.6  3.5 - 5.0 g/dL Final  . AST 09/18/2018 25  15 - 41 U/L Final  . ALT 09/18/2018 18  0 - 44 U/L Final  . Alkaline Phosphatase 09/18/2018 86  38 - 126 U/L Final  . Total Bilirubin 09/18/2018 0.7  0.3 - 1.2 mg/dL Final  . GFR calc non Af Amer 09/18/2018 >60  >60 mL/min Final  . GFR calc Af Amer 09/18/2018 >60  >60 mL/min Final  . Anion gap 09/18/2018 8  5 - 15 Final   Performed at [facility], [address], [place], [place] [address]  . LDH 09/18/2018 181  98 - 192 U/L Final   Performed at [facility], [address] [place], [place] [address]     Pathology Orders Placed This Encounter  Procedures  . CBC with Differential/Platelet    Standing Status:   Future    Standing Expiration Date:   09/18/2020  . Comprehensive metabolic panel    Standing Status:   Future    Standing Expiration Date:   09/18/2020  . Lactate dehydrogenase    Standing Status:   Future    Standing Expiration Date:   09/18/2020       Zoila Shutter MD

## 2018-09-22 ENCOUNTER — Other Ambulatory Visit (HOSPITAL_COMMUNITY): Payer: Self-pay | Admitting: Internal Medicine

## 2018-09-22 DIAGNOSIS — C169 Malignant neoplasm of stomach, unspecified: Secondary | ICD-10-CM

## 2018-09-22 MED ORDER — CAPECITABINE 500 MG PO TABS: 1000.0000 mg/m2 | ORAL_TABLET | Freq: Two times a day (BID) | ORAL | 0 refills | Status: DC

## 2018-09-25 MED ORDER — CAPECITABINE 500 MG PO TABS: 1000.0000 mg/m2 | ORAL_TABLET | Freq: Two times a day (BID) | ORAL | 0 refills | Status: DC

## 2018-09-26 ENCOUNTER — Other Ambulatory Visit (HOSPITAL_COMMUNITY): Payer: Self-pay

## 2018-09-28 ENCOUNTER — Telehealth (HOSPITAL_COMMUNITY): Payer: Self-pay | Admitting: Pharmacist

## 2018-09-28 NOTE — Telephone Encounter (Signed)
Oral Chemotherapy Pharmacist Encounter  Successfully enrolled patient for copayment assistance funds from [place] from the Gastric fund. This fund will help him cover the cost of his Xeloda.  Award amount: $4,000 Effective dates: 09/28/2018 - 09/29/2019 ID: [id number] BIN: [id number] Group: [id number] PCN: [id number]  Billing information will be shared with [facility]. I will place a copy of the award letter to be scanned into patient's chart.  Darl Pikes, PharmD, BCPS Hematology/Oncology Clinical Pharmacist [facility]/HP/AP Oral [facility] [phone]  09/28/2018 11:04 AM

## 2018-10-02 ENCOUNTER — Inpatient Hospital Stay (HOSPITAL_COMMUNITY): Payer: Medicare Other | Attending: Internal Medicine

## 2018-10-02 DIAGNOSIS — R197 Diarrhea, unspecified: Secondary | ICD-10-CM | POA: Insufficient documentation

## 2018-10-02 DIAGNOSIS — E871 Hypo-osmolality and hyponatremia: Secondary | ICD-10-CM | POA: Insufficient documentation

## 2018-10-02 DIAGNOSIS — Z8249 Family history of ischemic heart disease and other diseases of the circulatory system: Secondary | ICD-10-CM | POA: Diagnosis not present

## 2018-10-02 DIAGNOSIS — Z9221 Personal history of antineoplastic chemotherapy: Secondary | ICD-10-CM | POA: Insufficient documentation

## 2018-10-02 DIAGNOSIS — F17290 Nicotine dependence, other tobacco product, uncomplicated: Secondary | ICD-10-CM | POA: Insufficient documentation

## 2018-10-02 DIAGNOSIS — C169 Malignant neoplasm of stomach, unspecified: Secondary | ICD-10-CM

## 2018-10-02 DIAGNOSIS — Z9119 Patient's noncompliance with other medical treatment and regimen: Secondary | ICD-10-CM | POA: Diagnosis not present

## 2018-10-02 DIAGNOSIS — G4700 Insomnia, unspecified: Secondary | ICD-10-CM | POA: Insufficient documentation

## 2018-10-02 DIAGNOSIS — I129 Hypertensive chronic kidney disease with stage 1 through stage 4 chronic kidney disease, or unspecified chronic kidney disease: Secondary | ICD-10-CM | POA: Insufficient documentation

## 2018-10-02 DIAGNOSIS — G893 Neoplasm related pain (acute) (chronic): Secondary | ICD-10-CM | POA: Insufficient documentation

## 2018-10-02 DIAGNOSIS — Z5111 Encounter for antineoplastic chemotherapy: Secondary | ICD-10-CM | POA: Insufficient documentation

## 2018-10-02 DIAGNOSIS — I714 Abdominal aortic aneurysm, without rupture: Secondary | ICD-10-CM | POA: Diagnosis not present

## 2018-10-02 DIAGNOSIS — Z79899 Other long term (current) drug therapy: Secondary | ICD-10-CM | POA: Insufficient documentation

## 2018-10-02 DIAGNOSIS — K1230 Oral mucositis (ulcerative), unspecified: Secondary | ICD-10-CM | POA: Diagnosis not present

## 2018-10-02 DIAGNOSIS — Z7982 Long term (current) use of aspirin: Secondary | ICD-10-CM | POA: Diagnosis not present

## 2018-10-02 LAB — CBC WITH DIFFERENTIAL/PLATELET
Abs Immature Granulocytes: 0.06 10*3/uL (ref 0.00–0.07)
Basophils Absolute: 0 10*3/uL (ref 0.0–0.1)
Basophils Relative: 0 %
Eosinophils Absolute: 0.1 10*3/uL (ref 0.0–0.5)
Eosinophils Relative: 1 %
HCT: 30.8 % — ABNORMAL LOW (ref 39.0–52.0)
Hemoglobin: 9.9 g/dL — ABNORMAL LOW (ref 13.0–17.0)
Immature Granulocytes: 1 %
Lymphocytes Relative: 29 %
Lymphs Abs: 2.9 10*3/uL (ref 0.7–4.0)
MCH: 30.1 pg (ref 26.0–34.0)
MCHC: 32.1 g/dL (ref 30.0–36.0)
MCV: 93.6 fL (ref 80.0–100.0)
MONO ABS: 0.9 10*3/uL (ref 0.1–1.0)
MONOS PCT: 9 %
Neutro Abs: 6.1 10*3/uL (ref 1.7–7.7)
Neutrophils Relative %: 60 %
Platelets: 153 10*3/uL (ref 150–400)
RBC: 3.29 MIL/uL — ABNORMAL LOW (ref 4.22–5.81)
RDW: 25.5 % — ABNORMAL HIGH (ref 11.5–15.5)
WBC: 10.1 10*3/uL (ref 4.0–10.5)
nRBC: 0 % (ref 0.0–0.2)

## 2018-10-02 LAB — COMPREHENSIVE METABOLIC PANEL
ALK PHOS: 81 U/L (ref 38–126)
ALT: 25 U/L (ref 0–44)
AST: 31 U/L (ref 15–41)
Albumin: 3.4 g/dL — ABNORMAL LOW (ref 3.5–5.0)
Anion gap: 7 (ref 5–15)
BUN: 21 mg/dL — AB (ref 6–20)
CALCIUM: 9.4 mg/dL (ref 8.9–10.3)
CO2: 25 mmol/L (ref 22–32)
CREATININE: 0.87 mg/dL (ref 0.61–1.24)
Chloride: 98 mmol/L (ref 98–111)
GFR calc Af Amer: >60 mL/min (ref >60)
GFR calc non Af Amer: >60 mL/min (ref >60)
Glucose, Bld: 118 mg/dL — ABNORMAL HIGH (ref 70–99)
Potassium: 4.3 mmol/L (ref 3.5–5.1)
Sodium: 130 mmol/L — ABNORMAL LOW (ref 135–145)
Total Bilirubin: 0.3 mg/dL (ref 0.3–1.2)
Total Protein: 7 g/dL (ref 6.5–8.1)

## 2018-10-02 LAB — LACTATE DEHYDROGENASE: LDH: 187 U/L (ref 98–192)

## 2018-10-03 ENCOUNTER — Encounter (HOSPITAL_COMMUNITY): Payer: Self-pay

## 2018-10-03 ENCOUNTER — Inpatient Hospital Stay (HOSPITAL_BASED_OUTPATIENT_CLINIC_OR_DEPARTMENT_OTHER): Payer: Medicare Other | Admitting: Internal Medicine

## 2018-10-03 ENCOUNTER — Inpatient Hospital Stay (HOSPITAL_COMMUNITY): Payer: Medicare Other

## 2018-10-03 ENCOUNTER — Other Ambulatory Visit (HOSPITAL_COMMUNITY): Payer: Self-pay

## 2018-10-03 VITALS — BP 147/91 | HR 82 | Temp 98.7°F | Resp 18 | Wt 168.4 lb

## 2018-10-03 DIAGNOSIS — Z9119 Patient's noncompliance with other medical treatment and regimen: Secondary | ICD-10-CM

## 2018-10-03 DIAGNOSIS — Z5111 Encounter for antineoplastic chemotherapy: Secondary | ICD-10-CM | POA: Diagnosis not present

## 2018-10-03 DIAGNOSIS — C169 Malignant neoplasm of stomach, unspecified: Secondary | ICD-10-CM | POA: Diagnosis not present

## 2018-10-03 DIAGNOSIS — Z8249 Family history of ischemic heart disease and other diseases of the circulatory system: Secondary | ICD-10-CM

## 2018-10-03 DIAGNOSIS — Z79899 Other long term (current) drug therapy: Secondary | ICD-10-CM

## 2018-10-03 DIAGNOSIS — G893 Neoplasm related pain (acute) (chronic): Secondary | ICD-10-CM | POA: Diagnosis not present

## 2018-10-03 DIAGNOSIS — R197 Diarrhea, unspecified: Secondary | ICD-10-CM

## 2018-10-03 DIAGNOSIS — Z7982 Long term (current) use of aspirin: Secondary | ICD-10-CM | POA: Diagnosis not present

## 2018-10-03 DIAGNOSIS — I714 Abdominal aortic aneurysm, without rupture: Secondary | ICD-10-CM

## 2018-10-03 DIAGNOSIS — Z9221 Personal history of antineoplastic chemotherapy: Secondary | ICD-10-CM

## 2018-10-03 DIAGNOSIS — K1230 Oral mucositis (ulcerative), unspecified: Secondary | ICD-10-CM | POA: Diagnosis not present

## 2018-10-03 DIAGNOSIS — G4700 Insomnia, unspecified: Secondary | ICD-10-CM

## 2018-10-03 DIAGNOSIS — I129 Hypertensive chronic kidney disease with stage 1 through stage 4 chronic kidney disease, or unspecified chronic kidney disease: Secondary | ICD-10-CM

## 2018-10-03 DIAGNOSIS — E871 Hypo-osmolality and hyponatremia: Secondary | ICD-10-CM | POA: Diagnosis not present

## 2018-10-03 DIAGNOSIS — F17290 Nicotine dependence, other tobacco product, uncomplicated: Secondary | ICD-10-CM

## 2018-10-03 MED ORDER — SODIUM CHLORIDE 0.9% FLUSH
10.0000 mL | INTRAVENOUS | Status: DC | PRN
  Administered 2018-10-03: 10 mL
  Filled 2018-10-03: qty 10

## 2018-10-03 MED ORDER — SODIUM CHLORIDE 0.9 % IV SOLN
75.0000 mg/m2 | Freq: Once | INTRAVENOUS | Status: AC
  Administered 2018-10-03: 150 mg via INTRAVENOUS
  Filled 2018-10-03: qty 150

## 2018-10-03 MED ORDER — HEPARIN SOD (PORK) LOCK FLUSH 100 UNIT/ML IV SOLN
500.0000 [IU] | Freq: Once | INTRAVENOUS | Status: AC | PRN
  Administered 2018-10-03: 500 [IU]

## 2018-10-03 MED ORDER — FUROSEMIDE 10 MG/ML IJ SOLN
20.0000 mg | Freq: Once | INTRAMUSCULAR | Status: AC
  Administered 2018-10-03: 20 mg via INTRAVENOUS

## 2018-10-03 MED ORDER — PALONOSETRON HCL INJECTION 0.25 MG/5ML
0.2500 mg | Freq: Once | INTRAVENOUS | Status: AC
  Administered 2018-10-03: 0.25 mg via INTRAVENOUS
  Filled 2018-10-03: qty 5

## 2018-10-03 MED ORDER — SODIUM CHLORIDE 0.9 % IV SOLN
INTRAVENOUS | Status: DC
  Administered 2018-10-03: 11:00:00 via INTRAVENOUS

## 2018-10-03 MED ORDER — FUROSEMIDE 10 MG/ML IJ SOLN
INTRAMUSCULAR | Status: AC
  Filled 2018-10-03: qty 2

## 2018-10-03 MED ORDER — SODIUM CHLORIDE 0.9 % IV SOLN
Freq: Once | INTRAVENOUS | Status: AC
  Administered 2018-10-03: 11:00:00 via INTRAVENOUS
  Filled 2018-10-03: qty 5

## 2018-10-03 MED ORDER — SODIUM CHLORIDE 0.9 % IV SOLN
Freq: Once | INTRAVENOUS | Status: AC
  Administered 2018-10-03: 15:00:00 via INTRAVENOUS

## 2018-10-03 MED ORDER — SODIUM CHLORIDE 0.9 % IV SOLN
INTRAVENOUS | Status: AC
  Administered 2018-10-03: 09:00:00 via INTRAVENOUS

## 2018-10-03 NOTE — Patient Instructions (Addendum)
[facility] Discharge Instructions for Patients Receiving Chemotherapy   Beginning January 23rd 2017 lab work for the [facility] will be done in the  Main lab at [facility] on 1st floor. If you have a lab appointment with the [facility] please come in thru the  Main Entrance and check in at the main information desk   Today you received the following chemotherapy agents Cisplatin with hydration. Follow-up as scheduled. Call clinic for any questions or concerns  To help prevent nausea and vomiting after your treatment, we encourage you to take your nausea medication   If you develop nausea and vomiting, or diarrhea that is not controlled by your medication, call the clinic.  The clinic phone number is [phone]. Office hours are Monday-Friday 8:30am-5:00pm.  BELOW ARE SYMPTOMS THAT SHOULD BE REPORTED IMMEDIATELY:  *FEVER GREATER THAN 101.0 F  *CHILLS WITH OR WITHOUT FEVER  NAUSEA AND VOMITING THAT IS NOT CONTROLLED WITH YOUR NAUSEA MEDICATION  *UNUSUAL SHORTNESS OF BREATH  *UNUSUAL BRUISING OR BLEEDING  TENDERNESS IN MOUTH AND THROAT WITH OR WITHOUT PRESENCE OF ULCERS  *URINARY PROBLEMS  *BOWEL PROBLEMS  UNUSUAL RASH Items with * indicate a potential emergency and should be followed up as soon as possible. If you have an emergency after office hours please contact your primary care physician or go to the nearest emergency department.  Please call the clinic during office hours if you have any questions or concerns.   You may also contact the Patient Navigator at [phone] should you have any questions or need assistance in obtaining follow up care.      Resources For Cancer Patients and their Caregivers ? American Cancer Society: Can assist with transportation, wigs, general needs, runs Look Good Feel Better.        206 558 4674 ? Cancer Care: Provides financial assistance, online support groups, medication/co-pay assistance.   1-800-813-HOPE 272-754-4649) ? [place] Assists [place] cancer patients and their families through emotional , educational and financial support.  (217)538-1990 ? [place] DSS Where to apply for food stamps, Medicaid and utility assistance. 812 752 4437 ? [employer]: Transportation to medical appointments. (561) 507-3827 ? Social Security Administration: May apply for disability if have a Stage IV cancer. 303-875-0793 660-284-8051 ? [employer], Disability and Transit Services: Assists with nutrition, care and transit needs. 803-516-4620

## 2018-10-03 NOTE — Progress Notes (Signed)
0930 Labs reviewed with and pt seen by Dr. Walden Field and pt approved for chemo tx today per MD              1325 Pt only able to void 100 ml of urine so far. Dr. Walden Field made aware and approved Cisplatin to be given and ordered Lasix 20 mg IV as well.           Wellford Cellar tolerated Cisplatin infusion well without complaints. Pt voided 900 ml after Lasix given IV. Reviewed side effects of Cisplatin and Xeloda with the pt who verbalized understanding. Pt to start his Xeloda today with 14 days on and 7 days off(calender given to pt). VSS upon discharge. Pt discharged self ambulatory in satisfactory condition

## 2018-10-03 NOTE — Progress Notes (Signed)
[id number] Labs reviewed with and pt seen by Dr. Walden Field today and pt approved for chemo tx per MD

## 2018-10-03 NOTE — Progress Notes (Signed)
Diagnosis No diagnosis found.  Staging Cancer Staging No matching staging information was found for the patient.  Assessment and Plan:  1.  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  58 y.o. male initially seen by Dr. Sherrine Maples and reported he was in his usual state of health until around Christmas 2018 when he went camping in [place].  During that trip patient apparently had acute onset of epigastric pain associated with vomiting.  He reports retching with vomiting.  Sometime during that episode patient apparently attempted to put his fingers in the back of his throat to relieve the sensation of food being lodged thinking that he may have had a fish bone that was stuck in his throat.  This culminated in vomiting with small amount of blood.  He subsequently had relief from vomiting but he continued to have difficulty swallowing and hoarseness of voice.  Imaging of the neck showed posterior pharyngeal edema without evidence of any foreign object or fishbone.  The pain subsequently improved gradually.    He underwent a colonoscopy that showed tubular adenomas.  An EGD that revealed diffusely infiltrated wall of the stomach with superficial ulcerations.  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    PET scan was done 10/24/2017 and showed IMPRESSION: 1. Small focus of hypermetabolism along the lesser curvature of the antrum of the stomach, corresponding to recently diagnosed gastric neoplasm. No associated lymphadenopathy or definite findings to suggest metastatic disease to the neck, chest, abdomen or pelvis. 2. Aortic atherosclerosis, in addition to left main and 3 vessel coronary artery disease. Please note that although the presence of coronary artery calcium documents the presence of coronary artery disease, the severity of this disease and any potential stenosis cannot be assessed on this non-gated CT  examination. Assessment for potential risk factor modification, dietary therapy or pharmacologic therapy may be warranted, if clinically indicated. 3. In addition, there is fusiform aneurysmal dilatation of the infrarenal abdominal aorta which measures up to 3.9 x 3.7 cm. Recommend followup by ultrasound in 2 years. This recommendation follows ACR consensus guidelines: White Paper of the ACR Incidental Findings Committee II on Vascular Findings. J Am Coll Radiol 2013; 10:789-794.  He Is followed by Dr. Barry Dienes of surgery.  He initially indicated he did not desire surgery or chemotherapy.   Pt has diffuse gastric adenocarcinoma that appears to be localized to the stomach.  Histologically he appears to have a signet ring variety with diffuse involvement.  He was treated with neoadjuvant chemotherapy followed by surgery followed by adjuvant chemotherapy.  Previously, I have discussed with him that even if he were to be treated with surgery alone based on the diffuse nature of his cancer he was still be recommended for adjuvant therapy.    He was treated with FLOT regimen (5-FU dose at 2600 mg/m with 24 hours, oxaliplatin 85 mg/m, leucovorin 200 mg/m, and Taxotere 50 mg/m all on day 1 every 2 weeks for 4 cycles before surgery and 4 cycles following surgery.   PET scan done 10/24/2017 showed no evidence of distant disease.    EUS that was done 11/24/2017 showed:  Normal esophagus, ulcerated mass involving the lesser curvature of the stomach approximately mid body.  Mass measured 5 cm across 5 cm in length.  Mass appeared to be 7- 8 cm from the pylorus.  It was also 5 to 6 cm from the GE junction.  It appeared to pass into  and through the muscularis propria as well as the serosa making it a T4 a lesion.  Mass was 1.6 cm in maximal thickness.  No adenopathy was noted.  He was staged as a T4 a N0 by EUS.   He has undergone genetic testing that was negative.     He has completed 4 cycles of FLOT.  Pt  failed to keep his follow-up to go over PET scan and was referred to Dr. Barry Dienes for surgical evaluation.  He was planned for surgery on 04/26/2018.    Pt cancelled surgery and wanted  to wait until next year for operation.  He was also reporting port problems.    Pet scan done 03/20/2018 showed IMPRESSION: 1. No evidence of residual gastric carcinoma. 2. No evidence of carcinoma metastasis in the chest, abdomen, or pelvis. 3. Coronary artery calcification and Aortic Atherosclerosis (ICD10-I70.0).  Previously I discussed with him scan findings are due to chemotherapy and if he remains off treatment for a significant amount of time he has a significant risk for morbidity and mortality if he continues to be noncompliant with recommendations.  I have discussed with him he has a significant risk for death from advanced gastric cancer if he continues to defer surgery and adjuvant chemotherapy especially until next year which is what he was requesting.    After discussion he continued to defer surgery until after 04/29/2018.   Pt underwent total gastrectomy on 05/04/2018 Pathology returned as  1. Stomach, resection for tumor - INVASIVE ADENOCARCINOMA, POORLY DIFFERENTIATED, SPANNING AT LEAST 3.2 CM. - ADENOCARCINOMA IS PRESENT AT THE PROXIMAL MARGIN OF SPECIMEN 1. - LYMPHOVASCULAR INVASION IS IDENTIFIED. - PERINEURAL INVASION IS IDENTIFIED. - ADENOCARICNOMA EXTENDS THROUGH THE MUSCULARIS PROPRIA AND INTO SOFT TISSUE WITH PERITONEAL INVOLVEMENT. - METASTATIC CARCINOMA IN 6 OF 6 LYMPH NODES (6/6). - SEE ONCOLOGY TABLE BELOW. 2. Stomach, resection, Additional proximal margin - INVASIVE ADENOCARCINOMA, PRESENT AT THE PROXIMAL MARGIN OF SPECIMEN 2. - METASTATIC CARCINOMA IN 1 OF 4 LYMPH NODES (1/4). 3. Stomach, resection, additional proximal margin - INVASIVE ADENOCARCINOMA, PRESENT AT THE PROXIMAL MARGIN OF SPECIMEN 3. 4. Stomach, resection, Additional proximal margin - INVASIVE ADENOCARCINOMA, PRESENT  AT THE PROXIMAL MARGIN OF SPECIMEN 4. Microscopic Comment  I discussed with the patient that due to + margin and numerous LN he is recommended for adjuvant chemotherapy and RT.  He initially refused RT but after discussion he is willing to meet with RT for evaluation.    Based on results of CALGB study [id number] pts with resected gastric cancer who received postoperative chemoRT with ECF compared to 5FU based regimen did not show improved survival compared to 5FU before and after RT.  A recent study also showed that use of Xeloda with cisplatin followed by RT with Xeloda was better tolerated and had less toxicity than 4f.  Xeloda is comparable to 5FU with RT.  I discussed with him option of 2 cycles of Xeloda dosed at 1000 mg/m2 bid for 14 days with cisplatin 75 mg/m2 on D1 every 3 weeks followed by Xeloda with RT followed by an additional 1 or 2 cycles of XP.  XP had no difference in survival compared to 5 FU/LV.    ChemoRT demonstrated survival advantage over surgery alone in INT-0116 study.  Pt is recommended for chemo/RT.  Pt does not desire multiple days of chemotherapy.  I have discussed with him option of XP initially followed by Xeloda with RT.    Pt begun RT on 08/01/2018 and was treated  with Xeloda  825 mg bid Monday through Friday during RT.  Total dose 1500 mg po bid M-F during RT.   Pt completed RT in 08/2018  Pt will resume XP today.  Xeloda will be dosed at 1000 mg/m2 bid for 14 days totaling 2000 mg po bid for 14 days  with Cisplatin 75 mg/m2 on D1 every 3 weeks.   I have provided him a calender today with days marked for Xeloda.    Pt will RTC 10/24/2018 for follow-up and labs prior to C4 of XP.  He will be set up for imaging once C4 of XP completed.    2.  Mucositis.  Pt reports this is improved.  He was previously prescribed Magic mouthwash with lidocaine.  Pt now on MS Contin 15 mg po bid with MSIR 15 mg po q 4 hours prn breakthrough.  He reports pain is improved.   3.  Insomnia.   Pt recommended for OTC sleep aids.    4  Hyponatremia.  Sodium level decreased at 130.  Will repeat labs on RTC prior to C4.   5.  Hypertension.  BP 120/80.  Follow-up with PCP.  6.  Smoking.  Cessation is recommended.  PET scan showed no clear lung abnormalities.  7.  Abdominal aortic aneurysm.  This was noted on PET scan.  Patient was also noted to have atherosclerosis on recent PET scan.  He has been seen by vascular surgery and cardiology in the past and was on aspirin and statins.  8.  Compliance with treatment recommendations.  This has been a problem with Mr. Todd [name].  He has delayed surgery and had some compliance issues with follow-up. Pt was also not taking Xeloda as prescribed.    9.  Diarrhea.  Imodium prn.    10.  Pain.  Pain regimen changed to MS Contin 15 mg po bid for longer acting medication and MSIR 15 mg po q 4 hrs prn for breakthrough.  He reports pain control improved with current regimen.    11.  Feeding tube.  Feeding tube placed after original was pulled out.  Continue nutrition evaluation as directed.    25 minutes spent with more than 50% spent in counseling and coordination of care.     Interval History:  As above.   Current Status:  Pt is seen today for follow-up.  He has completed RT.  He is here for evaluation prior to Cisplatin.  He feels pain is improved with current regimen.      Malignant neoplasm of stomach ([place])   11/11/2017 Initial Diagnosis    Malignant neoplasm of stomach ([place])    12/16/2017 Genetic Testing    Negative genetic testing on the common hereditary cancer panel.  The Hereditary Gene Panel offered by [facility] includes sequencing and/or deletion duplication testing of the following 47 genes: APC, ATM, AXIN2, BARD1, BMPR1A, BRCA1, BRCA2, BRIP1, CDH1, CDK4, CDKN2A (p14ARF), CDKN2A (p16INK4a), CHEK2, CTNNA1, DICER1, EPCAM (Deletion/duplication testing only), GREM1 (promoter region deletion/duplication testing only), KIT, MEN1, MLH1, MSH2, MSH3,  MSH6, MUTYH, NBN, NF1, NHTL1, PALB2, PDGFRA, PMS2, POLD1, POLE, PTEN, RAD50, RAD51C, RAD51D, SDHB, SDHC, SDHD, SMAD4, SMARCA4. STK11, TP53, TSC1, TSC2, and VHL.  The following genes were evaluated for sequence changes only: SDHA and HOXB13 c.251G>A variant only. The report date is December 16, 2017.      Gastric carcinoma ([place])   01/23/2018 Initial Diagnosis    Gastric carcinoma ([place])    01/26/2018 - 03/27/2018 Chemotherapy    The patient  had palonosetron (ALOXI) injection 0.25 mg, 0.25 mg, Intravenous,  Once, 4 of 4 cycles Administration: 0.25 mg (01/31/2018), 0.25 mg (02/14/2018), 0.25 mg (02/28/2018), 0.25 mg (03/14/2018) pegfilgrastim (NEULASTA ONPRO KIT) injection 6 mg, 6 mg, Subcutaneous, Once, 4 of 4 cycles Administration: 6 mg (02/01/2018), 6 mg (03/01/2018) leucovorin 400 mg in dextrose 5 % 250 mL infusion, 428 mg, Intravenous,  Once, 4 of 4 cycles Administration: 400 mg (01/31/2018), 400 mg (02/14/2018), 400 mg (02/28/2018), 400 mg (03/14/2018) oxaliplatin (ELOXATIN) 180 mg in dextrose 5 % 500 mL chemo infusion, 85 mg/m2 = 180 mg, Intravenous,  Once, 4 of 4 cycles Administration: 180 mg (01/31/2018), 180 mg (02/14/2018), 180 mg (02/28/2018), 180 mg (03/14/2018) DOCEtaxel (TAXOTERE) 110 mg in sodium chloride 0.9 % 250 mL chemo infusion, 50 mg/m2 = 110 mg, Intravenous,  Once, 4 of 4 cycles Administration: 110 mg (01/31/2018), 110 mg (02/14/2018), 110 mg (02/28/2018), 110 mg (03/14/2018) fluorouracil (ADRUCIL) 5,550 mg in sodium chloride 0.9 % 139 mL chemo infusion, 2,600 mg/m2 = 5,550 mg, Intravenous, 1 Day/Dose, 4 of 4 cycles Administration: 5,550 mg (01/31/2018), 5,550 mg (02/14/2018), 5,550 mg (02/28/2018), 5,550 mg (03/14/2018)  for chemotherapy treatment.     06/20/2018 -  Chemotherapy    The patient had palonosetron (ALOXI) injection 0.25 mg, 0.25 mg, Intravenous,  Once, 3 of 3 cycles Administration: 0.25 mg (06/20/2018), 0.25 mg (07/11/2018) CISplatin (PLATINOL) 150 mg in sodium chloride 0.9 % 500 mL chemo infusion, 75  mg/m2 = 150 mg (100 % of original dose 75 mg/m2), Intravenous,  Once, 3 of 3 cycles Dose modification: 75 mg/m2 (original dose 75 mg/m2, Cycle 1, Reason: Other (see comments)) Administration: 150 mg (06/20/2018), 150 mg (07/11/2018) fosaprepitant (EMEND) 150 mg, dexamethasone (DECADRON) 12 mg in sodium chloride 0.9 % 145 mL IVPB, , Intravenous,  Once, 3 of 3 cycles Administration:  (06/20/2018),  (07/11/2018)  for chemotherapy treatment.       Problem List Patient Active Problem List   Diagnosis Date Noted  . Elevated lipase [R74.8] 03/17/2018  . Hypotension [I95.9] 03/17/2018  . Leukocytosis [D72.829] 03/17/2018  . Gastric carcinoma ([place]) [C16.9] 01/23/2018  . Genetic testing [Z13.79] 12/20/2017  . Family history of cancer [Z80.9]   . IBS (irritable bowel syndrome) [K58.9] 11/28/2017  . Malignant neoplasm of stomach ([place]) [C16.9] 11/11/2017  . Constipation [K59.00] 08/30/2017  . GERD (gastroesophageal reflux disease) [K21.9] 08/30/2017  . Globus sensation [R09.89] 08/30/2017  . Cerebral artery occlusion with cerebral infarction ([place]) [I63.50] 12/20/2016  . PAD (peripheral artery disease) ([place]) [I73.9] 08/09/2014  . CAD (coronary artery disease) [I25.10] 07/22/2014  . Tobacco abuse [Z72.0] 07/22/2014  . 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 Pure hypercholesterolemia [E78.00] 05/28/2014  . Hypertension [I10] 07/19/2012  . Hyperlipidemia [E78.5] 07/19/2012  . PVD (peripheral vascular disease) with claudication ([place]) [I73.9] 06/29/2012  . Atherosclerosis of native arteries of the extremities with intermittent claudication [I70.219] 04/20/2012    Past Medical History Past Medical History:  Diagnosis Date  . CAD (coronary artery disease)    a. Cath 07/2012 following abnormal nuclear stress test: 20-30% LAD, 20-30% OM1, occluded branch off OM2 that fills slowly via collaterals, small nondominant RCA occluded proximally, fills slowly via collaterals.  . CKD  (chronic kidney disease), stage II   . Family history of cancer   . Gastric adenocarcinoma ([place])   . History of blood transfusion    "when I was a child"  . Hyperlipidemia   . Hypertension   . Peripheral vascular disease ([place])  a. bilateral superficial femoral artery stents in May/August of 2012. b. Left femoral to above-knee popliteal bypass in 08/2012.  Marland Kitchen Plantar fasciitis   . Renal insufficiency   . Stroke [facility])    2008 right side weakness  . Tobacco abuse     Past Surgical History Past Surgical History:  Procedure Laterality Date  . ABDOMINAL AORTAGRAM N/A 04/27/2012   Procedure: ABDOMINAL Maxcine Ham;  Surgeon: Conrad Bluetown, MD;  Location: [facility] CATH LAB;  Service: Cardiovascular;  Laterality: N/A;  . ABDOMINAL AORTAGRAM N/A 08/09/2014   Procedure: ABDOMINAL Maxcine Ham;  Surgeon: Elam Dutch, MD;  Location: [facility] CATH LAB;  Service: Cardiovascular;  Laterality: N/A;  . BIOPSY  10/06/2017   Procedure: BIOPSY;  Surgeon: Daneil Dolin, MD;  Location: [facility] ENDO SUITE;  Service: Endoscopy;;  gastric   . CARDIAC CATHETERIZATION    . COLONOSCOPY    . COLONOSCOPY WITH PROPOFOL N/A 10/06/2017   Procedure: COLONOSCOPY WITH PROPOFOL;  Surgeon: Daneil Dolin, MD;  Location: [facility] ENDO SUITE;  Service: Endoscopy;  Laterality: N/A;  1:45pm - patient can't come earlier due to transportation  . ESOPHAGOGASTRODUODENOSCOPY (EGD) WITH PROPOFOL N/A 10/06/2017   Procedure: ESOPHAGOGASTRODUODENOSCOPY (EGD) WITH PROPOFOL;  Surgeon: Daneil Dolin, MD;  Location: [facility] ENDO SUITE;  Service: Endoscopy;  Laterality: N/A;  . EUS N/A 11/24/2017   Procedure: UPPER ENDOSCOPIC ULTRASOUND (EUS) RADIAL;  Surgeon: Milus Banister, MD;  Location: [facility] ENDOSCOPY;  Service: Endoscopy;  Laterality: N/A;  . FEMORAL ARTERY - POPLITEAL ARTERY BYPASS GRAFT    . FEMORAL-POPLITEAL BYPASS GRAFT  09/26/2012   Procedure: BYPASS GRAFT FEMORAL-POPLITEAL ARTERY;  Surgeon: Elam Dutch, MD;  Location: [facility] OR;  Service: Vascular;   Laterality: Left;  using 53m Gore-Tex Propaten Ringed Graft, Vein patch on the proximal and distal anastomosis  . FEMORAL-POPLITEAL BYPASS GRAFT Right 08/12/2014   Procedure:  FEMORAL-POPLITEAL ARTERY BYPASS WITH SAPHENOUS VEIN GRAFT, INTRAOPERATIVE ARTERIOGRAM;  Surgeon: CElam Dutch MD;  [place]  Service: Vascular;  Laterality: Right;  . GASTRECTOMY N/A 05/04/2018   Procedure: Total Gastrectomy with Feeding Jejunostomy;  Surgeon: BStark Klein MD;  [place] [facility] [place]  Service: General;  Laterality: N/A;  . INTRAOPERATIVE ARTERIOGRAM  08/12/2014   Procedure: INTRA OPERATIVE ARTERIOGRAM;  Surgeon: CElam Dutch MD;  [place]  Service: Vascular;;  . IR [place]DUODEN/JEJUNO TUBE PERCUT WVivianne Master 08/29/2018  . LAPAROSCOPY N/A 05/04/2018   Procedure: LAPAROSCOPY DIAGNOSTIC ERAS PATHWAY;  Surgeon: BStark Klein MD;  [place] [facility] [place]  Service: General;  Laterality: N/A;  EPIDURAL  . POLYPECTOMY  10/06/2017   Procedure: POLYPECTOMY;  Surgeon: RDaneil Dolin MD;  [facility];  Service: Endoscopy;;  splenic flexure,  ascending  . PORTACATH PLACEMENT N/A 01/24/2018   Procedure: INSERTION PORT-A-CATH;  Surgeon: BStark Klein MD;  [place]  Service: General;  Laterality: N/A;  . PR VEIN BYPASS GRAFT,AORTO-FEM-POP    . stents in bil legs      Family History Family History  Problem Relation Age of Onset  . Diabetes Mother   . Heart disease Mother        before age 56 Triple BPG  . Cancer Mother        Luekemia  . Deep vein thrombosis Mother   . Hyperlipidemia Mother   . Hypertension Mother   . Heart attack Mother   . Peripheral vascular disease Mother   . Stroke Mother        x 3  . Pancreatic  cancer Mother   . Arthritis Father   . Hypertension Father   . Kidney disease Father   . Cancer Maternal Grandmother        NOS  . Diabetes Cousin   . Cancer Cousin        NOS, pat first cousin  . Cancer Cousin        NOS, pat first cousin  . Colon cancer  Neg Hx   . Gastric cancer Neg Hx   . Esophageal cancer Neg Hx      Social History  reports that he has been smoking cigars. He started smoking about 43 years ago. He has a 25.00 pack-year smoking history. He has never used smokeless tobacco. He reports current drug use. Drug: Marijuana. He reports that he does not drink alcohol.  Medications  Current Outpatient Medications:  .  aspirin EC 81 MG tablet, Take 81 mg by mouth daily., Disp: , Rfl:  .  capecitabine (XELODA) 500 MG tablet, Take 4 tablets (2,000 mg total) by mouth 2 (two) times daily after a meal. Take for 14 days, then hold for 7 days. Repeat every 21 days., Disp: 112 tablet, Rfl: 0 .  chlorproMAZINE (THORAZINE) 10 MG tablet, Take 1 tablet (10 mg total) by mouth 4 (four) times daily as needed for hiccoughs., Disp: 30 tablet, Rfl: 1 .  CISPLATIN IV, Inject into the vein., Disp: , Rfl:  .  Diphenhyd-Hydrocort-Nystatin (FIRST-DUKES MOUTHWASH) SUSP, Swish and swallow 84m four times a day., Disp: 480 mL, Rfl: 1 .  feeding supplement, ENSURE ENLIVE, (ENSURE ENLIVE) LIQD, Take 237 mLs by mouth 2 (two) times daily between meals. (Patient taking differently: Take 237 mLs by mouth 2 (two) times daily between meals. Pt is taking 1 1/2 bottles 2 times daily), Disp: 237 mL, Rfl: 12 .  lidocaine-prilocaine (EMLA) cream, Apply to affected area once, Disp: 30 g, Rfl: 3 .  Loperamide HCl (IMODIUM PO), Take 1 tablet by mouth as needed (Pt takes 1 tablet every 2 days to prevent diarrhea)., Disp: , Rfl:  .  methocarbamol (ROBAXIN) 500 MG tablet, Take 1 tablet (500 mg total) by mouth every 6 (six) hours as needed for muscle spasms., Disp: 20 tablet, Rfl: 1 .  morphine (MS CONTIN) 15 MG 12 hr tablet, Take 1 tablet (15 mg total) by mouth every 12 (twelve) hours., Disp: 60 tablet, Rfl: 0 .  morphine (MSIR) 15 MG tablet, Take 1 tablet (15 mg total) by mouth every 4 (four) hours as needed for severe pain., Disp: 90 tablet, Rfl: 0 .  Nutritional Supplements  (FEEDING SUPPLEMENT, OSMOLITE 1.5 CAL,) LIQD, Place 1,000 mLs into feeding tube daily., Disp: 30 Bottle, Rfl: 0 .  ondansetron (ZOFRAN) 8 MG tablet, Take 1 tablet (8 mg total) by mouth 2 (two) times daily as needed. Start on the third day after chemotherapy., Disp: 30 tablet, Rfl: 1 .  prochlorperazine (COMPAZINE) 10 MG tablet, TAKE 1 TABLET BY MOUTH EVERY 6 HOURS AS NEEDED FOR NAUSEA OR VOMITING, Disp: 30 tablet, Rfl: 1 No current facility-administered medications for this visit.   Facility-Administered Medications Ordered in Other Visits:  .  0.9 %  sodium chloride infusion, , Intravenous, Continuous, Kaden Dunkel, MD, Last Rate: 20 mL/hr at 10/03/18 1111  Allergies Potassium-containing compounds  Review of Systems Review of Systems - Oncology ROS negative   Physical Exam  Vitals Wt Readings from Last 3 Encounters:  10/03/18 168 lb 6.4 oz (76.4 kg)  09/18/18 168 lb 4.8 oz (76.3 kg)  08/29/18  167 lb 8 oz (76 kg)   Temp Readings from Last 3 Encounters:  10/03/18 98.1 F (36.7 C) (Oral)  09/18/18 98.4 F (36.9 C) (Oral)  08/29/18 97.8 F (36.6 C) (Oral)   BP Readings from Last 3 Encounters:  10/03/18 120/80  08/29/18 (!) 118/96  08/27/18 (!) 151/98   Pulse Readings from Last 3 Encounters:  10/03/18 99  09/18/18 (!) 112  08/29/18 (!) 114   Constitutional: Well-developed, well-nourished, and in no distress.   HENT: Head: Normocephalic and atraumatic.  Mouth/Throat: No oropharyngeal exudate. Mucosa moist. Eyes: Pupils are equal, round, and reactive to light. Conjunctivae are normal. No scleral icterus.  Neck: Normal range of motion. Neck supple. No JVD present.  Cardiovascular: Normal rate, regular rhythm and normal heart sounds.  Exam reveals no gallop and no friction rub.   No murmur heard. Pulmonary/Chest: Effort normal and breath sounds normal. No respiratory distress. No wheezes.No rales.  Abdominal: Soft. Bowel sounds are normal. No distension. There is no  tenderness. There is no guarding. Feeding tube in place.   Musculoskeletal: No edema or tenderness.  Lymphadenopathy: No cervical, axillary or supraclavicular adenopathy.  Neurological: Alert and oriented to person, place, and time. No cranial nerve deficit.  Skin: Skin is warm and dry. No rash noted. No erythema. No pallor.  Psychiatric: Affect and judgment normal.   Labs Appointment on 10/02/2018  Component Date Value Ref Range Status  . WBC 10/02/2018 10.1  4.0 - 10.5 K/uL Final  . RBC 10/02/2018 3.29* 4.22 - 5.81 MIL/uL Final  . Hemoglobin 10/02/2018 9.9* 13.0 - 17.0 g/dL Final  . HCT 10/02/2018 30.8* 39.0 - 52.0 % Final  . MCV 10/02/2018 93.6  80.0 - 100.0 fL Final  . MCH 10/02/2018 30.1  26.0 - 34.0 pg Final  . MCHC 10/02/2018 32.1  30.0 - 36.0 g/dL Final  . RDW 10/02/2018 25.5* 11.5 - 15.5 % Final  . Platelets 10/02/2018 153  150 - 400 K/uL Final  . nRBC 10/02/2018 0.0  0.0 - 0.2 % Final  . Neutrophils Relative % 10/02/2018 60  % Final  . Neutro Abs 10/02/2018 6.1  1.7 - 7.7 K/uL Final  . Lymphocytes Relative 10/02/2018 29  % Final  . Lymphs Abs 10/02/2018 2.9  0.7 - 4.0 K/uL Final  . Monocytes Relative 10/02/2018 9  % Final  . Monocytes Absolute 10/02/2018 0.9  0.1 - 1.0 K/uL Final  . Eosinophils Relative 10/02/2018 1  % Final  . Eosinophils Absolute 10/02/2018 0.1  0.0 - 0.5 K/uL Final  . Basophils Relative 10/02/2018 0  % Final  . Basophils Absolute 10/02/2018 0.0  0.0 - 0.1 K/uL Final  . RBC Morphology 10/02/2018 ANISOCYTOSIS   Final  . Immature Granulocytes 10/02/2018 1  % Final  . Abs Immature Granulocytes 10/02/2018 0.06  0.00 - 0.07 K/uL Final   Performed at [facility], [address], [place], [place] [address]  . Sodium 10/02/2018 130* 135 - 145 mmol/L Final  . Potassium 10/02/2018 4.3  3.5 - 5.1 mmol/L Final  . Chloride 10/02/2018 98  98 - 111 mmol/L Final  . CO2 10/02/2018 25  22 - 32 mmol/L Final  . Glucose, Bld 10/02/2018 118* 70 - 99 mg/dL Final  . BUN  10/02/2018 21* 6 - 20 mg/dL Final  . Creatinine, Ser 10/02/2018 0.87  0.61 - 1.24 mg/dL Final  . Calcium 10/02/2018 9.4  8.9 - 10.3 mg/dL Final  . Total Protein 10/02/2018 7.0  6.5 - 8.1 g/dL Final  .  Albumin 10/02/2018 3.4* 3.5 - 5.0 g/dL Final  . AST 10/02/2018 31  15 - 41 U/L Final  . ALT 10/02/2018 25  0 - 44 U/L Final  . Alkaline Phosphatase 10/02/2018 81  38 - 126 U/L Final  . Total Bilirubin 10/02/2018 0.3  0.3 - 1.2 mg/dL Final  . GFR calc non Af Amer 10/02/2018 >60  >60 mL/min Final  . GFR calc Af Amer 10/02/2018 >60  >60 mL/min Final  . Anion gap 10/02/2018 7  5 - 15 Final   Performed at [facility], [address], [place], [place] [address]  . L[facility] 10/02/2018 187  98 - 192 U/L Final   Performed at [facility], [address] [place], [place] [address]     Pathology No orders of the defined types were placed in this encounter.      Zoila Shutter MD

## 2018-10-06 MED FILL — CAPECITABINE 500 MG TABS: 500 | 21 days supply | Qty: 112 | Fill #0

## 2018-10-07 DIAGNOSIS — C160 Malignant neoplasm of cardia: Secondary | ICD-10-CM | POA: Diagnosis not present

## 2018-10-07 DIAGNOSIS — C169 Malignant neoplasm of stomach, unspecified: Secondary | ICD-10-CM | POA: Diagnosis not present

## 2018-10-09 ENCOUNTER — Ambulatory Visit (HOSPITAL_COMMUNITY): Payer: Self-pay | Admitting: Internal Medicine

## 2018-10-09 ENCOUNTER — Other Ambulatory Visit (HOSPITAL_COMMUNITY): Payer: Self-pay

## 2018-10-10 ENCOUNTER — Other Ambulatory Visit (HOSPITAL_COMMUNITY): Payer: Self-pay | Admitting: *Deleted

## 2018-10-10 MED ORDER — MISC. DEVICES MISC: 10 refills | Status: AC

## 2018-10-10 NOTE — Telephone Encounter (Signed)
Per dietician [name], prescription written for home health orders.

## 2018-10-11 DIAGNOSIS — Z510 Encounter for antineoplastic radiation therapy: Secondary | ICD-10-CM | POA: Diagnosis not present

## 2018-10-11 DIAGNOSIS — C163 Malignant neoplasm of pyloric antrum: Secondary | ICD-10-CM | POA: Diagnosis not present

## 2018-10-11 DIAGNOSIS — C169 Malignant neoplasm of stomach, unspecified: Secondary | ICD-10-CM | POA: Diagnosis not present

## 2018-10-16 DIAGNOSIS — C160 Malignant neoplasm of cardia: Secondary | ICD-10-CM | POA: Diagnosis not present

## 2018-10-17 ENCOUNTER — Other Ambulatory Visit: Payer: Self-pay

## 2018-10-17 DIAGNOSIS — I714 Abdominal aortic aneurysm, without rupture, unspecified: Secondary | ICD-10-CM

## 2018-10-17 DIAGNOSIS — I739 Peripheral vascular disease, unspecified: Secondary | ICD-10-CM

## 2018-10-17 DIAGNOSIS — T82898D Other specified complication of vascular prosthetic devices, implants and grafts, subsequent encounter: Secondary | ICD-10-CM

## 2018-10-17 DIAGNOSIS — I779 Disorder of arteries and arterioles, unspecified: Secondary | ICD-10-CM

## 2018-10-19 ENCOUNTER — Ambulatory Visit (HOSPITAL_COMMUNITY)
Admission: RE | Admit: 2018-10-19 | Discharge: 2018-10-19 | Disposition: A | Discharge status: Home / Self Care | Payer: Medicare Other | Source: Ambulatory Visit | Attending: Vascular Surgery | Admitting: Vascular Surgery

## 2018-10-19 ENCOUNTER — Ambulatory Visit (INDEPENDENT_AMBULATORY_CARE_PROVIDER_SITE_OTHER)
Admission: RE | Admit: 2018-10-19 | Discharge: 2018-10-19 | Disposition: A | Discharge status: Home / Self Care | Payer: Medicare Other | Source: Ambulatory Visit | Attending: Family | Admitting: Family

## 2018-10-19 ENCOUNTER — Ambulatory Visit: Payer: Medicare Other | Admitting: Vascular Surgery

## 2018-10-19 DIAGNOSIS — I714 Abdominal aortic aneurysm, without rupture, unspecified: Secondary | ICD-10-CM

## 2018-10-19 DIAGNOSIS — I739 Peripheral vascular disease, unspecified: Secondary | ICD-10-CM

## 2018-10-19 DIAGNOSIS — T82898D Other specified complication of vascular prosthetic devices, implants and grafts, subsequent encounter: Secondary | ICD-10-CM | POA: Diagnosis not present

## 2018-10-19 DIAGNOSIS — I779 Disorder of arteries and arterioles, unspecified: Secondary | ICD-10-CM | POA: Insufficient documentation

## 2018-10-23 ENCOUNTER — Other Ambulatory Visit (HOSPITAL_COMMUNITY): Payer: Self-pay | Admitting: Internal Medicine

## 2018-10-23 DIAGNOSIS — C169 Malignant neoplasm of stomach, unspecified: Secondary | ICD-10-CM

## 2018-10-24 ENCOUNTER — Inpatient Hospital Stay (HOSPITAL_BASED_OUTPATIENT_CLINIC_OR_DEPARTMENT_OTHER): Payer: Medicare Other | Admitting: Internal Medicine

## 2018-10-24 ENCOUNTER — Other Ambulatory Visit: Payer: Self-pay

## 2018-10-24 ENCOUNTER — Inpatient Hospital Stay (HOSPITAL_COMMUNITY): Payer: Medicare Other | Admitting: Dietician

## 2018-10-24 ENCOUNTER — Encounter (HOSPITAL_COMMUNITY): Payer: Self-pay

## 2018-10-24 ENCOUNTER — Inpatient Hospital Stay (HOSPITAL_COMMUNITY): Payer: Medicare Other

## 2018-10-24 VITALS — BP 142/89 | HR 80 | Temp 98.6°F | Resp 16 | Wt 163.4 lb

## 2018-10-24 VITALS — BP 102/62 | HR 94 | Temp 99.3°F | Resp 18 | Wt 163.4 lb

## 2018-10-24 DIAGNOSIS — Z8249 Family history of ischemic heart disease and other diseases of the circulatory system: Secondary | ICD-10-CM | POA: Diagnosis not present

## 2018-10-24 DIAGNOSIS — C169 Malignant neoplasm of stomach, unspecified: Secondary | ICD-10-CM

## 2018-10-24 DIAGNOSIS — Z7982 Long term (current) use of aspirin: Secondary | ICD-10-CM | POA: Diagnosis not present

## 2018-10-24 DIAGNOSIS — G4700 Insomnia, unspecified: Secondary | ICD-10-CM

## 2018-10-24 DIAGNOSIS — I714 Abdominal aortic aneurysm, without rupture: Secondary | ICD-10-CM

## 2018-10-24 DIAGNOSIS — E871 Hypo-osmolality and hyponatremia: Secondary | ICD-10-CM | POA: Diagnosis not present

## 2018-10-24 DIAGNOSIS — K1230 Oral mucositis (ulcerative), unspecified: Secondary | ICD-10-CM | POA: Diagnosis not present

## 2018-10-24 DIAGNOSIS — Z9119 Patient's noncompliance with other medical treatment and regimen: Secondary | ICD-10-CM

## 2018-10-24 DIAGNOSIS — Z9221 Personal history of antineoplastic chemotherapy: Secondary | ICD-10-CM | POA: Diagnosis not present

## 2018-10-24 DIAGNOSIS — Z79899 Other long term (current) drug therapy: Secondary | ICD-10-CM

## 2018-10-24 DIAGNOSIS — Z5111 Encounter for antineoplastic chemotherapy: Secondary | ICD-10-CM | POA: Diagnosis not present

## 2018-10-24 DIAGNOSIS — R197 Diarrhea, unspecified: Secondary | ICD-10-CM | POA: Diagnosis not present

## 2018-10-24 DIAGNOSIS — I129 Hypertensive chronic kidney disease with stage 1 through stage 4 chronic kidney disease, or unspecified chronic kidney disease: Secondary | ICD-10-CM | POA: Diagnosis not present

## 2018-10-24 DIAGNOSIS — F17210 Nicotine dependence, cigarettes, uncomplicated: Secondary | ICD-10-CM

## 2018-10-24 DIAGNOSIS — G893 Neoplasm related pain (acute) (chronic): Secondary | ICD-10-CM

## 2018-10-24 LAB — CBC WITH DIFFERENTIAL/PLATELET
Abs Immature Granulocytes: 0.04 10*3/uL (ref 0.00–0.07)
Basophils Absolute: 0 10*3/uL (ref 0.0–0.1)
Basophils Relative: 0 %
Eosinophils Absolute: 0 10*3/uL (ref 0.0–0.5)
Eosinophils Relative: 1 %
HCT: 28.7 % — ABNORMAL LOW (ref 39.0–52.0)
Hemoglobin: 9.4 g/dL — ABNORMAL LOW (ref 13.0–17.0)
Immature Granulocytes: 1 %
Lymphocytes Relative: 30 %
Lymphs Abs: 0.9 10*3/uL (ref 0.7–4.0)
MCH: 33.1 pg (ref 26.0–34.0)
MCHC: 32.8 g/dL (ref 30.0–36.0)
MCV: 101.1 fL — ABNORMAL HIGH (ref 80.0–100.0)
MONO ABS: 0.6 10*3/uL (ref 0.1–1.0)
MONOS PCT: 18 %
Neutro Abs: 1.5 10*3/uL — ABNORMAL LOW (ref 1.7–7.7)
Neutrophils Relative %: 50 %
Platelets: 167 10*3/uL (ref 150–400)
RBC: 2.84 MIL/uL — ABNORMAL LOW (ref 4.22–5.81)
RDW: 20 % — ABNORMAL HIGH (ref 11.5–15.5)
WBC: 3.1 10*3/uL — ABNORMAL LOW (ref 4.0–10.5)
nRBC: 0.6 % — ABNORMAL HIGH (ref 0.0–0.2)

## 2018-10-24 LAB — COMPREHENSIVE METABOLIC PANEL
ALK PHOS: 74 U/L (ref 38–126)
ALT: 16 U/L (ref 0–44)
AST: 21 U/L (ref 15–41)
Albumin: 3.1 g/dL — ABNORMAL LOW (ref 3.5–5.0)
Anion gap: 7 (ref 5–15)
BUN: 21 mg/dL — ABNORMAL HIGH (ref 6–20)
CALCIUM: 9.1 mg/dL (ref 8.9–10.3)
CO2: 29 mmol/L (ref 22–32)
Chloride: 97 mmol/L — ABNORMAL LOW (ref 98–111)
Creatinine, Ser: 0.79 mg/dL (ref 0.61–1.24)
GFR calc Af Amer: >60 mL/min (ref >60)
GFR calc non Af Amer: >60 mL/min (ref >60)
Glucose, Bld: 102 mg/dL — ABNORMAL HIGH (ref 70–99)
Potassium: 4.4 mmol/L (ref 3.5–5.1)
Sodium: 133 mmol/L — ABNORMAL LOW (ref 135–145)
TOTAL PROTEIN: 7 g/dL (ref 6.5–8.1)
Total Bilirubin: 0.4 mg/dL (ref 0.3–1.2)

## 2018-10-24 LAB — LACTATE DEHYDROGENASE: LDH: 164 U/L (ref 98–192)

## 2018-10-24 MED ORDER — SODIUM CHLORIDE 0.9 % IV SOLN
75.0000 mg/m2 | Freq: Once | INTRAVENOUS | Status: AC
  Administered 2018-10-24: 150 mg via INTRAVENOUS
  Filled 2018-10-24: qty 150

## 2018-10-24 MED ORDER — FUROSEMIDE 10 MG/ML IJ SOLN
40.0000 mg | Freq: Once | INTRAMUSCULAR | Status: AC
  Administered 2018-10-24: 40 mg via INTRAVENOUS

## 2018-10-24 MED ORDER — FUROSEMIDE 10 MG/ML IJ SOLN
INTRAMUSCULAR | Status: AC
  Filled 2018-10-24: qty 2

## 2018-10-24 MED ORDER — SODIUM CHLORIDE 0.9 % IV SOLN
INTRAVENOUS | Status: AC
  Administered 2018-10-24: 10:00:00 via INTRAVENOUS

## 2018-10-24 MED ORDER — SODIUM CHLORIDE 0.9% FLUSH
10.0000 mL | INTRAVENOUS | Status: DC | PRN
  Administered 2018-10-24: 10 mL
  Filled 2018-10-24: qty 10

## 2018-10-24 MED ORDER — SODIUM CHLORIDE 0.9 % IV SOLN: Freq: Once | INTRAVENOUS | Status: DC

## 2018-10-24 MED ORDER — SODIUM CHLORIDE 0.9 % IV SOLN
Freq: Once | INTRAVENOUS | Status: AC
  Administered 2018-10-24: 12:00:00 via INTRAVENOUS
  Filled 2018-10-24: qty 5

## 2018-10-24 MED ORDER — PALONOSETRON HCL INJECTION 0.25 MG/5ML
0.2500 mg | Freq: Once | INTRAVENOUS | Status: AC
  Administered 2018-10-24: 0.25 mg via INTRAVENOUS
  Filled 2018-10-24: qty 5

## 2018-10-24 MED ORDER — HEPARIN SOD (PORK) LOCK FLUSH 100 UNIT/ML IV SOLN
500.0000 [IU] | Freq: Once | INTRAVENOUS | Status: AC | PRN
  Administered 2018-10-24: 500 [IU]
  Filled 2018-10-24: qty 5

## 2018-10-24 MED ORDER — SODIUM CHLORIDE 0.9 % IV SOLN
INTRAVENOUS | Status: DC
  Administered 2018-10-24: 12:00:00 via INTRAVENOUS

## 2018-10-24 NOTE — Progress Notes (Signed)
Nutrition Follow-up 58 y/o male PMHx CAD, CKD2, PVD, CVA, Tobacco abuse, HTN/HLD. Developed acute epigastric pain w/ n/v and globus sensation Dec 2018. EGD would later show GEJ ulcers, s/p biopsy + for gastric carcinoma. Underwent 4 cycles of neoadjuvant FLOT chemo regimen. Pt initially decided to forego recommended surg intervention. 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 After he completed adjuvant IV chemotherapy, he begun oral chemo + adjuvant radiation, completing both on 12/17.   Since last seen, [facility] rep had reached out to RD regarding the acquisition of new TF orders. These were signed by MD and sent to [facility]- Osmolite 1.5 @ 85 x 14 hrs w/ 30 ml promod  Unfortunately, pt today shows wt loss - approximately 5 lbs in the last 3 weeks. Today he is 163 lbs 6.4 oz, but 3 weeks ago he was weighed at 168. 6.4 oz.   Today, he reports he went 2 days without any feeding whatsoever d/t a lack of bags for his pump. He says he has them now though. He says he is infusing his TF at a rate of 85 cc/hr from 5pm-7am (sometimes finishing later?). He reports compliance with the 1 oz of Promod.   When asked if he has been eating orally, he says "not as I should be". He says he largely sits around and watches TV during the day, during which he snacks on items such as puddings. He reports still drinking Ensure- Atleast 1 per day. Fluid wise, he says he drinks water, sweet tea, and ensure at night. He drinks atleast 2 bottles of water specifically. He sounds to intermittently eat more substantial items-he shares an anecdote of how he ate crablegs w/ his family recently.  He reports severe pain at his J-tube site, which he attribute to radiology placing a different tube last time-he says "this one is 4", the one before that was 6"...its too tight".    He denies n/v/d. He reports severe constipation and says "this is where all the weight is going". RD probed more  about what he meant by this- it seems he believes he is straining so hard to have BMs that he is burning an exorbitant about of kcals, contributing to weight loss. In the past, he had taken imodium. RD asked if he was still taking this and he said he wasn't.   Wt Readings from Last 10 Encounters:  10/24/18 163 lb 6.4 oz (74.1 kg)  10/24/18 163 lb 6.4 oz (74.1 kg)  10/03/18 168 lb 6.4 oz (76.4 kg)  09/18/18 168 lb 4.8 oz (76.3 kg)  08/29/18 167 lb 8 oz (76 kg)  08/27/18 171 lb (77.6 kg)  08/15/18 172 lb 12.8 oz (78.4 kg)  07/24/18 169 lb 1.6 oz (76.7 kg)  07/13/18 173 lb (78.5 kg)  07/11/18 174 lb 3.2 oz (79 kg)   MEDICATIONS:  Chemotherapy: Oral Xeloda-completed. Receiving cisplatin infusion today. Other: Thorazine, Ensure Enlive, Zofran, Compazine, Oxycodone, Robaxin, morphine  LABS: Albumin: 3.1,  3.4 (1/06),  Na:133, BG:102  Recent Labs  Lab 10/24/18 0811  NA 133*  K 4.4  CL 97*  CO2 29  BUN 21*  CREATININE 0.79  CALCIUM 9.1  GLUCOSE 102*    ANTHROPOMETRICS: Height:  Ht Readings from Last 1 Encounters:  08/27/18 '6\' 1"'$  (1.854 m)   Weight:  Wt Readings from Last 1 Encounters:  10/24/18 163 lb 6.4 oz (74.1 kg)   BMI:  BMI Readings from Last 1 Encounters:  10/24/18 21.56 kg/m  UBW: 200-205 lbs IBW: 184 lbs or 83.64 kg  Wt change: -5  lbs x 3 weeks - 10 lbs x 10 weeks - 19 lbs x 6 months **meets malnutrition criteria (10.2%-was 182.1lbs) - 37.7 lbs x 1 yr (18.7% bw) (201 Jan 2020)    ReESTIMATED ENERGY NEEDS:  Kcal: 2350-2600 (32-35 kcal/kg bw) Protein:104-119g Pro (1.4-1.6g/kg bw) Fluid: 2.2-2.6 L fluid (30-35 ml/kg bw)  NUTRITION DIAGNOSIS:  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   DOCUMENTATION CODES:  Moderate Malnutrition, chronic context  INTERVENTION:   RD suspicious pt is not running his TF as he says. Managing the pts tube feeding has proven  exceedingly difficult, as he largely manages it as he sees fit. He arbitrarily changes both the length of time he is on the pump and the rate at which it infuses. When last seen, he told RD he turned his TF rate down from 75 to 65 because "I was on the pump to long"-he didn't seem to understand that this would increase his time on pump, not decrease it.   RD reviewed nutrition goals with patient. Wrote them down on piece of paper as well:  5 cans of Osmolite 1.5 via Pump @ rate of 85 cc/hr, from 5 pm-7 am. Flush w/ 60 cc before/after infusion. +1 oz of Promod (100 kcal, 10 g Pro), 30 ml flush before/after  TF alone provides: 1878 kcals, 85g Pro, 1085cc fluid.   To meet rest of needs, he will continue consuming ATLEAST:   1 Ensure Enlive (350 kcal, 20g Pro)  3 of his Pudding cups  2, 16.9 oz bottles of water (1014cc)  Anything else he can tolerate   RD provided patient with his 10th case of Ensure.  Will continue to monitor weight closely, as this is really the only feedback tool that RD has available given his unreliable oral reporting   RD also discussed how to manage his constipation w/ OTC softeners/laxatives.    Plan of care discussed with MD. This is his last chemotherapy tx pending imaging reevaluation.   GOAL:   Oral intake to meet >90% of estimate needs  Wt stability vs gain  Patient will demonstrate compliance w/ TF  Not met  MONITOR:  Weight, TF tolerance, oral diet tolerance, tolerance to chemotherapy/radiation  Ongoing  Next Visit: TBD  [name] RD, LDN, CNSC Clinical Nutrition Available Tues-Sat via Pager: [phone] 10/24/2018 9:02 AM

## 2018-10-24 NOTE — Patient Instructions (Signed)
[facility] Discharge Instructions for Patients Receiving Chemotherapy  Today you received the following chemotherapy agents   To help prevent nausea and vomiting after your treatment, we encourage you to take your nausea medication   If you develop nausea and vomiting that is not controlled by your nausea medication, call the clinic.   BELOW ARE SYMPTOMS THAT SHOULD BE REPORTED IMMEDIATELY:  *FEVER GREATER THAN 100.5 F  *CHILLS WITH OR WITHOUT FEVER  NAUSEA AND VOMITING THAT IS NOT CONTROLLED WITH YOUR NAUSEA MEDICATION  *UNUSUAL SHORTNESS OF BREATH  *UNUSUAL BRUISING OR BLEEDING  TENDERNESS IN MOUTH AND THROAT WITH OR WITHOUT PRESENCE OF ULCERS  *URINARY PROBLEMS  *BOWEL PROBLEMS  UNUSUAL RASH Items with * indicate a potential emergency and should be followed up as soon as possible.  Feel free to call the clinic should you have any questions or concerns. The clinic phone number is [phone].  Please show the CHEMO ALERT CARD at check-in to the Emergency Department and triage nurse.   

## 2018-10-24 NOTE — Progress Notes (Signed)
Diagnosis Gastric carcinoma ([place]) - Plan: CBC with Differential/Platelet, Comprehensive metabolic panel, Lactate dehydrogenase, NM PET Image Restag (PS) Skull Base To Thigh, DISCONTINUED: 0.9 %  sodium chloride infusion, DISCONTINUED: 0.9 %  sodium chloride infusion, DISCONTINUED: sodium chloride flush (NS) 0.9 % injection 10 mL, DISCONTINUED: heparin lock flush 100 unit/mL, DISCONTINUED: palonosetron (ALOXI) injection 0.25 mg, DISCONTINUED: fosaprepitant (EMEND) 150 mg, dexamethasone (DECADRON) 12 mg in sodium chloride 0.9 % 145 mL IVPB, DISCONTINUED: CISplatin (PLATINOL) 150 mg in sodium chloride 0.9 % 500 mL chemo infusion  Staging Cancer Staging No matching staging information was found for the patient.  Assessment and Plan:  1.  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  58 y.o. male initially seen by Dr. Sherrine Maples and reported he was in his usual state of health until around Christmas 2018 when he went camping in [place].  During that trip patient apparently had acute onset of epigastric pain associated with vomiting.  He reports retching with vomiting.  Sometime during that episode patient apparently attempted to put his fingers in the back of his throat to relieve the sensation of food being lodged thinking that he may have had a fish bone that was stuck in his throat.  This culminated in vomiting with small amount of blood.  He subsequently had relief from vomiting but he continued to have difficulty swallowing and hoarseness of voice.  Imaging of the neck showed posterior pharyngeal edema without evidence of any foreign object or fishbone.  The pain subsequently improved gradually.    He underwent a colonoscopy that showed tubular adenomas.  An EGD that revealed diffusely infiltrated wall of the stomach with superficial ulcerations.  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    PET scan was done  10/24/2017 and showed IMPRESSION: 1. Small focus of hypermetabolism along the lesser curvature of the antrum of the stomach, corresponding to recently diagnosed gastric neoplasm. No associated lymphadenopathy or definite findings to suggest metastatic disease to the neck, chest, abdomen or pelvis. 2. Aortic atherosclerosis, in addition to left main and 3 vessel coronary artery disease. Please note that although the presence of coronary artery calcium documents the presence of coronary artery disease, the severity of this disease and any potential stenosis cannot be assessed on this non-gated CT examination. Assessment for potential risk factor modification, dietary therapy or pharmacologic therapy may be warranted, if clinically indicated. 3. In addition, there is fusiform aneurysmal dilatation of the infrarenal abdominal aorta which measures up to 3.9 x 3.7 cm. Recommend followup by ultrasound in 2 years. This recommendation follows ACR consensus guidelines: White Paper of the ACR Incidental Findings Committee II on Vascular Findings. J Am Coll Radiol 2013; 10:789-794.  He Is followed by Dr. Barry Dienes of surgery.  He initially indicated he did not desire surgery or chemotherapy.   Pt has diffuse gastric adenocarcinoma that appears to be localized to the stomach.  Histologically he appears to have a signet ring variety with diffuse involvement.  He was treated with neoadjuvant chemotherapy followed by surgery followed by adjuvant chemotherapy.  Previously, I have discussed with him that even if he were to be treated with surgery alone based on the diffuse nature of his cancer he was still be recommended for adjuvant therapy.    He was treated with FLOT regimen (5-FU dose at 2600 mg/m with 24 hours, oxaliplatin 85 mg/m, leucovorin 200 mg/m, and Taxotere 50 mg/m all on day 1 every 2  weeks for 4 cycles before surgery and 4 cycles following surgery.   PET scan done 10/24/2017 showed no  evidence of distant disease.    EUS that was done 11/24/2017 showed:  Normal esophagus, ulcerated mass involving the lesser curvature of the stomach approximately mid body.  Mass measured 5 cm across 5 cm in length.  Mass appeared to be 7- 8 cm from the pylorus.  It was also 5 to 6 cm from the GE junction.  It appeared to pass into and through the muscularis propria as well as the serosa making it a T4 a lesion.  Mass was 1.6 cm in maximal thickness.  No adenopathy was noted.  He was staged as a T4 a N0 by EUS.   He has undergone genetic testing that was negative.     He has completed 4 cycles of FLOT.  Pt failed to keep his follow-up to go over PET scan and was referred to Dr. Barry Dienes for surgical evaluation.  He was planned for surgery on 04/26/2018.    Pt cancelled surgery and wanted  to wait until next year for operation.  He was also reporting port problems.    Pet scan done 03/20/2018 showed IMPRESSION: 1. No evidence of residual gastric carcinoma. 2. No evidence of carcinoma metastasis in the chest, abdomen, or pelvis. 3. Coronary artery calcification and Aortic Atherosclerosis (ICD10-I70.0).  Previously I discussed with him scan findings are due to chemotherapy and if he remains off treatment for a significant amount of time he has a significant risk for morbidity and mortality if he continues to be noncompliant with recommendations.  I have discussed with him he has a significant risk for death from advanced gastric cancer if he continues to defer surgery and adjuvant chemotherapy especially until next year which is what he was requesting.    After discussion he continued to defer surgery until after 04/29/2018.   Pt underwent total gastrectomy on 05/04/2018 Pathology returned as  1. Stomach, resection for tumor - INVASIVE ADENOCARCINOMA, POORLY DIFFERENTIATED, SPANNING AT LEAST 3.2 CM. - ADENOCARCINOMA IS PRESENT AT THE PROXIMAL MARGIN OF SPECIMEN 1. - LYMPHOVASCULAR INVASION IS  IDENTIFIED. - PERINEURAL INVASION IS IDENTIFIED. - ADENOCARICNOMA EXTENDS THROUGH THE MUSCULARIS PROPRIA AND INTO SOFT TISSUE WITH PERITONEAL INVOLVEMENT. - METASTATIC CARCINOMA IN 6 OF 6 LYMPH NODES (6/6). - SEE ONCOLOGY TABLE BELOW. 2. Stomach, resection, Additional proximal margin - INVASIVE ADENOCARCINOMA, PRESENT AT THE PROXIMAL MARGIN OF SPECIMEN 2. - METASTATIC CARCINOMA IN 1 OF 4 LYMPH NODES (1/4). 3. Stomach, resection, additional proximal margin - INVASIVE ADENOCARCINOMA, PRESENT AT THE PROXIMAL MARGIN OF SPECIMEN 3. 4. Stomach, resection, Additional proximal margin - INVASIVE ADENOCARCINOMA, PRESENT AT THE PROXIMAL MARGIN OF SPECIMEN 4. Microscopic Comment  I discussed with the patient that due to + margin and numerous LN he is recommended for adjuvant chemotherapy and RT.  He initially refused RT but after discussion he is willing to meet with RT for evaluation.    Based on results of CALGB study [id number] pts with resected gastric cancer who received postoperative chemoRT with ECF compared to 5FU based regimen did not show improved survival compared to 5FU before and after RT.  A recent study also showed that use of Xeloda with cisplatin followed by RT with Xeloda was better tolerated and had less toxicity than 42f.  Xeloda is comparable to 5FU with RT.  I discussed with him option of 2 cycles of Xeloda dosed at 1000 mg/m2 bid for 14 days with cisplatin 75  mg/m2 on D1 every 3 weeks followed by Xeloda with RT followed by an additional 1 or 2 cycles of XP.  XP had no difference in survival compared to 5 FU/LV.    ChemoRT demonstrated survival advantage over surgery alone in INT-[id number] study.  Pt is recommended for chemo/RT.  Pt does not desire multiple days of chemotherapy.  I have discussed with him option of XP initially followed by Xeloda with RT.    Pt begun RT on 08/01/2018 and was treated with Xeloda  825 mg bid Monday through Friday during RT.  Total dose 1500 mg po bid M-F during  RT.   Pt completed RT in 08/2018  Pt is here for C4 of  XP today.  Xeloda dosed at 1000 mg/m2 bid for 14 days totaling 2000 mg po bid for 14 days  with Cisplatin 75 mg/m2 on D1 every 3 weeks.   I have again provided him a calender today with days marked for Xeloda.    Labs done 10/24/2018 reviewed and showed WBC 3.1 HB 9.4 plts 167,000.  ANC 1500.  Chemistries WNL with K+ 4.4 Cr 0.79 and normal LFTs.  Counts adequate for treatment today.    He is set up for PET scan for follow-up imaging in 3 weeks after completion of C4  He will follow-up to go over scans.    2.  Mucositis.  Pt was previously prescribed Magic mouthwash with lidocaine.  Pt now on MS Contin 15 mg po bid with MSIR 15 mg po q 4 hours prn breakthrough.  Will continue to monitor for improvement.    3.  Insomnia.  Pt recommended for OTC sleep aids.    4  Hyponatremia.  Sodium level improving at 133.  Will repeat labs on RTC in 3 weeks.   5.  Hypertension.  BP 102/62. Follow-up with PCP.  6.  Smoking.  Cessation is recommended.  Previous PET scan showed no clear lung abnormalities.  7.  Abdominal aortic aneurysm.  This was noted on PET scan.  Patient was also noted to have atherosclerosis on prior PET scan.  He has been seen by vascular surgery and cardiology in the past and was on aspirin and statins.  8.  Compliance with treatment recommendations.  This has been a problem with Mr. Strebeck.  He has delayed surgery and had some compliance issues with follow-up. Pt was also not taking Xeloda as prescribed.    9.  Diarrhea.  Imodium prn.    10.  Pain.  Pain regimen changed to MS Contin 15 mg po bid for longer acting medication and MSIR 15 mg po q 4 hrs prn for breakthrough. Will continue to monitor.    11.  Feeding tube.  Feeding tube placed after original was pulled out.  Continue nutrition evaluation as directed.  Will consider IR evaluation is ongoing complaints regarding tube.    12  Constipation.  Pt should do miralax or stool  softeners  if ongoing symptoms due to use of pain medications.    25 minutes spent with more than 50% spent in counseling and coordination of care.     Current Status:  Pt is seen today for follow-up prior to C4 of XP.  He reports he feels the tube is too tight.  He reports he has enough Xeloda.  He reports constipation.      Malignant neoplasm of stomach ([place])   11/11/2017 Initial Diagnosis    Malignant neoplasm of stomach ([place])    12/16/2017  Genetic Testing    Negative genetic testing on the common hereditary cancer panel.  The Hereditary Gene Panel offered by [facility] includes sequencing and/or deletion duplication testing of the following 47 genes: APC, ATM, AXIN2, BARD1, BMPR1A, BRCA1, BRCA2, BRIP1, CDH1, CDK4, CDKN2A (p14ARF), CDKN2A (p16INK4a), CHEK2, CTNNA1, DICER1, EPCAM (Deletion/duplication testing only), GREM1 (promoter region deletion/duplication testing only), KIT, MEN1, MLH1, MSH2, MSH3, MSH6, MUTYH, NBN, NF1, NHTL1, PALB2, PDGFRA, PMS2, POLD1, POLE, PTEN, RAD50, RAD51C, RAD51D, SDHB, SDHC, SDHD, SMAD4, SMARCA4. STK11, TP53, TSC1, TSC2, and VHL.  The following genes were evaluated for sequence changes only: SDHA and HOXB13 c.251G>A variant only. The report date is December 16, 2017.      Gastric carcinoma ([place])   01/23/2018 Initial Diagnosis    Gastric carcinoma ([place])    01/26/2018 - 03/27/2018 Chemotherapy    The patient had palonosetron (ALOXI) injection 0.25 mg, 0.25 mg, Intravenous,  Once, 4 of 4 cycles Administration: 0.25 mg (01/31/2018), 0.25 mg (02/14/2018), 0.25 mg (02/28/2018), 0.25 mg (03/14/2018) pegfilgrastim (NEULASTA ONPRO KIT) injection 6 mg, 6 mg, Subcutaneous, Once, 4 of 4 cycles Administration: 6 mg (02/01/2018), 6 mg (03/01/2018) leucovorin 400 mg in dextrose 5 % 250 mL infusion, 428 mg, Intravenous,  Once, 4 of 4 cycles Administration: 400 mg (01/31/2018), 400 mg (02/14/2018), 400 mg (02/28/2018), 400 mg (03/14/2018) oxaliplatin (ELOXATIN) 180 mg in dextrose 5 % 500 mL chemo  infusion, 85 mg/m2 = 180 mg, Intravenous,  Once, 4 of 4 cycles Administration: 180 mg (01/31/2018), 180 mg (02/14/2018), 180 mg (02/28/2018), 180 mg (03/14/2018) DOCEtaxel (TAXOTERE) 110 mg in sodium chloride 0.9 % 250 mL chemo infusion, 50 mg/m2 = 110 mg, Intravenous,  Once, 4 of 4 cycles Administration: 110 mg (01/31/2018), 110 mg (02/14/2018), 110 mg (02/28/2018), 110 mg (03/14/2018) fluorouracil (ADRUCIL) 5,550 mg in sodium chloride 0.9 % 139 mL chemo infusion, 2,600 mg/m2 = 5,550 mg, Intravenous, 1 Day/Dose, 4 of 4 cycles Administration: 5,550 mg (01/31/2018), 5,550 mg (02/14/2018), 5,550 mg (02/28/2018), 5,550 mg (03/14/2018)  for chemotherapy treatment.     06/20/2018 -  Chemotherapy    The patient had palonosetron (ALOXI) injection 0.25 mg, 0.25 mg, Intravenous,  Once, 4 of 4 cycles Administration: 0.25 mg (06/20/2018), 0.25 mg (07/11/2018), 0.25 mg (10/03/2018) CISplatin (PLATINOL) 150 mg in sodium chloride 0.9 % 500 mL chemo infusion, 75 mg/m2 = 150 mg (100 % of original dose 75 mg/m2), Intravenous,  Once, 4 of 4 cycles Dose modification: 75 mg/m2 (original dose 75 mg/m2, Cycle 1, Reason: Other (see comments)) Administration: 150 mg (06/20/2018), 150 mg (07/11/2018), 150 mg (10/03/2018) fosaprepitant (EMEND) 150 mg, dexamethasone (DECADRON) 12 mg in sodium chloride 0.9 % 145 mL IVPB, , Intravenous,  Once, 4 of 4 cycles Administration:  (06/20/2018),  (07/11/2018),  (10/03/2018)  for chemotherapy treatment.       Problem List Patient Active Problem List   Diagnosis Date Noted  . Elevated lipase [R74.8] 03/17/2018  . Hypotension [I95.9] 03/17/2018  . Leukocytosis [D72.829] 03/17/2018  . Gastric carcinoma ([place]) [C16.9] 01/23/2018  . Genetic testing [Z13.79] 12/20/2017  . Family history of cancer [Z80.9]   . IBS (irritable bowel syndrome) [K58.9] 11/28/2017  . Malignant neoplasm of stomach ([place]) [C16.9] 11/11/2017  . Constipation [K59.00] 08/30/2017  . GERD (gastroesophageal reflux disease) [K21.9]  08/30/2017  . Globus sensation [R09.89] 08/30/2017  . Cerebral artery occlusion with cerebral infarction ([place]) [I63.50] 12/20/2016  . PAD (peripheral artery disease) ([place]) [I73.9] 08/09/2014  . CAD (coronary artery disease) [I25.10] 07/22/2014  . Tobacco abuse [Z72.0] 07/22/2014  .  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 Pure hypercholesterolemia [E78.00] 05/28/2014  . Hypertension [I10] 07/19/2012  . Hyperlipidemia [E78.5] 07/19/2012  . PVD (peripheral vascular disease) with claudication ([place]) [I73.9] 06/29/2012  . Atherosclerosis of native arteries of the extremities with intermittent claudication [I70.219] 04/20/2012    Past Medical History Past Medical History:  Diagnosis Date  . CAD (coronary artery disease)    a. Cath 07/2012 following abnormal nuclear stress test: 20-30% LAD, 20-30% OM1, occluded branch off OM2 that fills slowly via collaterals, small nondominant RCA occluded proximally, fills slowly via collaterals.  . CKD (chronic kidney disease), stage II   . Family history of cancer   . Gastric adenocarcinoma ([place])   . History of blood transfusion    "when I was a child"  . Hyperlipidemia   . Hypertension   . Peripheral vascular disease ([place])    a. bilateral superficial femoral artery stents in May/August of 2012. b. Left femoral to above-knee popliteal bypass in 08/2012.  Marland Kitchen Plantar fasciitis   . Renal insufficiency   . Stroke [facility])    2008 right side weakness  . Tobacco abuse     Past Surgical History Past Surgical History:  Procedure Laterality Date  . ABDOMINAL AORTAGRAM N/A 04/27/2012   Procedure: ABDOMINAL Maxcine Ham;  Surgeon: Conrad [place], MD;  Location: [facility] CATH LAB;  Service: Cardiovascular;  Laterality: N/A;  . ABDOMINAL AORTAGRAM N/A 08/09/2014   Procedure: ABDOMINAL Maxcine Ham;  Surgeon: Elam Dutch, MD;  Location: [facility] CATH LAB;  Service: Cardiovascular;  Laterality: N/A;  . BIOPSY  10/06/2017   Procedure: BIOPSY;  Surgeon: Daneil Dolin, MD;  Location: [facility] ENDO SUITE;  Service: Endoscopy;;  gastric   . CARDIAC CATHETERIZATION    . COLONOSCOPY    . COLONOSCOPY WITH PROPOFOL N/A 10/06/2017   Procedure: COLONOSCOPY WITH PROPOFOL;  Surgeon: Daneil Dolin, MD;  Location: [facility] ENDO SUITE;  Service: Endoscopy;  Laterality: N/A;  1:45pm - patient can't come earlier due to transportation  . ESOPHAGOGASTRODUODENOSCOPY (EGD) WITH PROPOFOL N/A 10/06/2017   Procedure: ESOPHAGOGASTRODUODENOSCOPY (EGD) WITH PROPOFOL;  Surgeon: Daneil Dolin, MD;  [facility] END[facility];  Service: Endoscopy;  Laterality: N/A;  . EUS N/A 11/24/2017   Procedure: UPPER ENDOSCOPIC ULTRASOUND (EUS) RADIAL;  Surgeon: Milus Banister, MD;  Location: [facility] ENDOSCOPY;  Service: Endoscopy;  Laterality: N/A;  . FEMORAL ARTERY - POPLITEAL ARTERY BYPASS GRAFT    . FEMORAL-POPLITEAL BYPASS GRAFT  09/26/2012   Procedure: BYPASS GRAFT FEMORAL-POPLITEAL ARTERY;  Surgeon: Elam Dutch, MD;  Location: [facility] OR;  Service: Vascular;  Laterality: Left;  using 68m Gore-Tex Propaten Ringed Graft, Vein patch on the proximal and distal anastomosis  . FEMORAL-POPLITEAL BYPASS GRAFT Right 08/12/2014   Procedure:  FEMORAL-POPLITEAL ARTERY BYPASS WITH SAPHENOUS VEIN GRAFT, INTRAOPERATIVE ARTERIOGRAM;  Surgeon: CElam Dutch MD;  [place]  Service: Vascular;  Laterality: Right;  . GASTRECTOMY N/A 05/04/2018   Procedure: Total Gastrectomy with Feeding Jejunostomy;  Surgeon: BStark Klein MD;  [place]  Service: General;  Laterality: N/A;  . INTRAOPERATIVE ARTERIOGRAM  08/12/2014   Procedure: INTRA OPERATIVE ARTERIOGRAM;  Surgeon: CElam Dutch MD;  [place]  Service: Vascular;;  . IR RFort JonesDUODEN/JEJUNO TUBE PERCUT WVivianne Master 08/29/2018  . LAPAROSCOPY N/A 05/04/2018   Procedure: LAPAROSCOPY DIAGNOSTIC ERAS PATHWAY;  Surgeon: BStark Klein MD;  [place]  Service: General;  Laterality: N/A;  EPIDURAL  . POLYPECTOMY  10/06/2017   Procedure:  POLYPECTOMY;  Surgeon: Daneil Dolin, MD;  [facility];  Service: Endoscopy;;  splenic flexure,  ascending  . PORTACATH PLACEMENT N/A 01/24/2018   Procedure: INSERTION PORT-A-CATH;  Surgeon: [name], MD;  Location: [place];  Service: General;  Laterality: N/A;  . PR VEIN BYPASS GRAFT,AORTO-FEM-POP    . stents in bil legs      Family History Family History  Problem Relation Age of Onset  . Diabetes Mother   . Heart disease Mother        before age 11- Triple BPG  . Cancer Mother        Luekemia  . Deep vein thrombosis Mother   . Hyperlipidemia Mother   . Hypertension Mother   . Heart attack Mother   . Peripheral vascular disease Mother   . Stroke Mother        x 3  . Pancreatic cancer Mother   . Arthritis Father   . Hypertension Father   . Kidney disease Father   . Cancer Maternal Grandmother        NOS  . Diabetes Cousin   . Cancer Cousin        NOS, pat first cousin  . Cancer Cousin        NOS, pat first cousin  . Colon cancer Neg Hx   . Gastric cancer Neg Hx   . Esophageal cancer Neg Hx      Social History  reports that he has been smoking cigars. He started smoking about 43 years ago. He has a 25.00 pack-year smoking history. He has never used smokeless tobacco. He reports current drug use. Drug: Marijuana. He reports that he does not drink alcohol.  Medications  Current Outpatient Medications:  .  aspirin EC 81 MG tablet, Take 81 mg by mouth daily., Disp: , Rfl:  .  capecitabine (XELODA) 500 MG tablet, TAKE 4 TABLETS (2,000 MG TOTAL) BY MOUTH 2 (TWO) TIMES DAILY AFTER A MEAL. TAKE FOR 14 DAYS, THEN HOLD FOR 7 DAYS. REPEAT EVERY 21 DAYS., Disp: 112 tablet, Rfl: 0 .  chlorproMAZINE (THORAZINE) 10 MG tablet, Take 1 tablet (10 mg total) by mouth 4 (four) times daily as needed for hiccoughs., Disp: 30 tablet, Rfl: 1 .  CISPLATIN IV, Inject into the vein., Disp: , Rfl:  .  Diphenhyd-Hydrocort-Nystatin (FIRST-DUKES MOUTHWASH) SUSP, Swish and swallow 64m  four times a day. (Patient not taking: Reported on 10/24/2018), Disp: 480 mL, Rfl: 1 .  feeding supplement, ENSURE ENLIVE, (ENSURE ENLIVE) LIQD, Take 237 mLs by mouth 2 (two) times daily between meals. (Patient not taking: Reported on 10/24/2018), Disp: 237 mL, Rfl: 12 .  lidocaine-prilocaine (EMLA) cream, Apply to affected area once, Disp: 30 g, Rfl: 3 .  Loperamide HCl (IMODIUM PO), Take 1 tablet by mouth as needed (Pt takes 1 tablet every 2 days to prevent diarrhea)., Disp: , Rfl:  .  methocarbamol (ROBAXIN) 500 MG tablet, Take 1 tablet (500 mg total) by mouth every 6 (six) hours as needed for muscle spasms., Disp: 20 tablet, Rfl: 1 .  Misc. Devices MISC, 5 Cartons Osmolite 1.5 via pump @ 85 ml/hr x 14 hours via J-tube from 5 pm - 7 am 1 ounce Promod every 24 hours via J-tube Flush feeding tube with 60 ml water before and after Osmolite via J-tube Flush feeding tube with 30 ml water before and after Promod via J-tube, Disp: 1 each, Rfl: 10 .  morphine (MS CONTIN) 15 MG 12 hr tablet, Take 1 tablet (15  mg total) by mouth every 12 (twelve) hours., Disp: 60 tablet, Rfl: 0 .  morphine (MSIR) 15 MG tablet, Take 1 tablet (15 mg total) by mouth every 4 (four) hours as needed for severe pain., Disp: 90 tablet, Rfl: 0 .  Nutritional Supplements (FEEDING SUPPLEMENT, OSMOLITE 1.5 CAL,) LIQD, Place 1,000 mLs into feeding tube daily., Disp: 30 Bottle, Rfl: 0 .  ondansetron (ZOFRAN) 8 MG tablet, Take 1 tablet (8 mg total) by mouth 2 (two) times daily as needed. Start on the third day after chemotherapy., Disp: 30 tablet, Rfl: 1 .  prochlorperazine (COMPAZINE) 10 MG tablet, TAKE 1 TABLET BY MOUTH EVERY 6 HOURS AS NEEDED FOR NAUSEA OR VOMITING, Disp: 30 tablet, Rfl: 1 No current facility-administered medications for this visit.   Facility-Administered Medications Ordered in Other Visits:  .  0.9 %  sodium chloride infusion, , Intravenous, Continuous, Scotty Pinder, MD, Last Rate: 500 mL/hr at 10/24/18 1020 .  0.9 %   sodium chloride infusion, , Intravenous, Once, Aiken Withem, MD .  CISplatin (PLATINOL) 150 mg in sodium chloride 0.9 % 500 mL chemo infusion, 75 mg/m2 (Treatment Plan Recorded), Intravenous, Once, Dorn Hartshorne, Mathis Dad, MD .  fosaprepitant (EMEND) 150 mg, dexamethasone (DECADRON) 12 mg in sodium chloride 0.9 % 145 mL IVPB, , Intravenous, Once, Yishai Rehfeld, MD .  heparin lock flush 100 unit/mL, 500 Units, Intracatheter, Once PRN, Gilmore List, MD .  palonosetron (ALOXI) injection 0.25 mg, 0.25 mg, Intravenous, Once, Jaella Weinert, MD .  sodium chloride flush (NS) 0.9 % injection 10 mL, 10 mL, Intracatheter, PRN, Karris Deangelo, MD  Allergies Potassium-containing compounds  Review of Systems Review of Systems - Oncology ROS negative other than constipation and tube complaints.     Physical Exam  Vitals Wt Readings from Last 3 Encounters:  10/24/18 163 lb 6.4 oz (74.1 kg)  10/24/18 163 lb 6.4 oz (74.1 kg)  10/03/18 168 lb 6.4 oz (76.4 kg)   Temp Readings from Last 3 Encounters:  10/24/18 99.3 F (37.4 C) (Oral)  10/24/18 99.3 F (37.4 C) (Oral)  10/03/18 98.7 F (37.1 C) (Oral)   BP Readings from Last 3 Encounters:  10/24/18 102/62  10/24/18 102/62  10/03/18 (!) 147/91   Pulse Readings from Last 3 Encounters:  10/24/18 94  10/24/18 94  10/03/18 82   Constitutional: Well-developed, well-nourished, and in no distress.   HENT: Head: Normocephalic and atraumatic.  Mouth/Throat: No oropharyngeal exudate. Mucosa moist. Eyes: Pupils are equal, round, and reactive to light. Conjunctivae are normal. No scleral icterus.  Neck: Normal range of motion. Neck supple. No JVD present.  Cardiovascular: Normal rate, regular rhythm and normal heart sounds.  Exam reveals no gallop and no friction rub.   No murmur heard. Pulmonary/Chest: Effort normal and breath sounds normal. No respiratory distress. No wheezes.No rales.  Abdominal: Soft. Bowel sounds are normal. No distension. Minor tenderness to  palpation.  There is no guarding. Feeding tube on left.   Musculoskeletal: No edema or tenderness.  Lymphadenopathy: No cervical, axillary or supraclavicular adenopathy.  Neurological: Alert and oriented to person, place, and time. No cranial nerve deficit.  Skin: Skin is warm and dry. No rash noted. No erythema. No pallor.  Psychiatric: Affect and judgment normal.   Labs Appointment on 10/24/2018  Component Date Value Ref Range Status  . WBC 10/24/2018 3.1* 4.0 - 10.5 K/uL Final  . RBC 10/24/2018 2.84* 4.22 - 5.81 MIL/uL Final  . Hemoglobin 10/24/2018 9.4* 13.0 - 17.0 g/dL Final  . HCT 10/24/2018 28.7*  39.0 - 52.0 % Final  . MCV 10/24/2018 101.1* 80.0 - 100.0 fL Final  . MCH 10/24/2018 33.1  26.0 - 34.0 pg Final  . MCHC 10/24/2018 32.8  30.0 - 36.0 g/dL Final  . RDW 10/24/2018 20.0* 11.5 - 15.5 % Final  . Platelets 10/24/2018 167  150 - 400 K/uL Final  . nRBC 10/24/2018 0.6* 0.0 - 0.2 % Final  . Neutrophils Relative % 10/24/2018 50  % Final  . Neutro Abs 10/24/2018 1.5* 1.7 - 7.7 K/uL Final  . Lymphocytes Relative 10/24/2018 30  % Final  . Lymphs Abs 10/24/2018 0.9  0.7 - 4.0 K/uL Final  . Monocytes Relative 10/24/2018 18  % Final  . Monocytes Absolute 10/24/2018 0.6  0.1 - 1.0 K/uL Final  . Eosinophils Relative 10/24/2018 1  % Final  . Eosinophils Absolute 10/24/2018 0.0  0.0 - 0.5 K/uL Final  . Basophils Relative 10/24/2018 0  % Final  . Basophils Absolute 10/24/2018 0.0  0.0 - 0.1 K/uL Final  . Immature Granulocytes 10/24/2018 1  % Final  . Abs Immature Granulocytes 10/24/2018 0.04  0.00 - 0.07 K/uL Final   Performed at [facility], [address] [place], [place] [address]  . Sodium 10/24/2018 133* 135 - 145 mmol/L Final  . Potassium 10/24/2018 4.4  3.5 - 5.1 mmol/L Final  . Chloride 10/24/2018 97* 98 - 111 mmol/L Final  . CO2 10/24/2018 29  22 - 32 mmol/L Final  . Glucose, Bld 10/24/2018 102* 70 - 99 mg/dL Final  . BUN 10/24/2018 21* 6 - 20 mg/dL Final  . Creatinine, Ser  10/24/2018 0.79  0.61 - 1.24 mg/dL Final  . Calcium 10/24/2018 9.1  8.9 - 10.3 mg/dL Final  . Total Protein 10/24/2018 7.0  6.5 - 8.1 g/dL Final  . Albumin 10/24/2018 3.1* 3.5 - 5.0 g/dL Final  . AST 10/24/2018 21  15 - 41 U/L Final  . ALT 10/24/2018 16  0 - 44 U/L Final  . Alkaline Phosphatase 10/24/2018 74  38 - 126 U/L Final  . Total Bilirubin 10/24/2018 0.4  0.3 - 1.2 mg/dL Final  . GFR calc non Af Amer 10/24/2018 >60  >60 mL/min Final  . GFR calc Af Amer 10/24/2018 >60  >60 mL/min Final  . Anion gap 10/24/2018 7  5 - 15 Final   Performed at [facility], [address] [place], [place] [address]  . LDH 10/24/2018 164  98 - 192 U/L Final   Performed at [facility], [address] [place], [place] [address]     Pathology Orders Placed This Encounter  Procedures  . NM PET Image Restag (PS) Skull Base To Thigh    Standing Status:   Future    Standing Expiration Date:   10/24/2019    Order Specific Question:   If indicated for the ordered procedure, I authorize the administration of a radiopharmaceutical per Radiology protocol    Answer:   Yes    Order Specific Question:   Preferred imaging location?    Answer:   [facility]    Order Specific Question:   Radiology Contrast Protocol - do NOT remove file path    Answer:   [email or web address]  . CBC with Differential/Platelet    Standing Status:   Future    Standing Expiration Date:   10/25/2019  . Comprehensive metabolic panel    Standing Status:   Future    Standing Expiration Date:   10/25/2019  . Lactate dehydrogenase  Standing Status:   Future    Standing Expiration Date:   10/25/2019       Zoila Shutter MD

## 2018-10-24 NOTE — Progress Notes (Signed)
Labs reviewed with Dr. Walden Field at office visit today. Will do treatment today per MD.   1200 no urine output noted. MD made aware. Will give 40 mg iv per MD.   4536 pt voided 348ml.   Treatment given per orders. Patient tolerated it well without problems. Vitals stable and discharged home from clinic ambulatory. Follow up as scheduled.

## 2018-10-25 ENCOUNTER — Other Ambulatory Visit (HOSPITAL_COMMUNITY): Payer: Self-pay | Admitting: Pharmacist

## 2018-10-25 DIAGNOSIS — C169 Malignant neoplasm of stomach, unspecified: Secondary | ICD-10-CM

## 2018-10-25 MED ORDER — CAPECITABINE 500 MG PO TABS: 1000.0000 mg/m2 | ORAL_TABLET | Freq: Two times a day (BID) | ORAL | 0 refills | Status: AC

## 2018-10-26 ENCOUNTER — Ambulatory Visit: Payer: Medicare Other | Admitting: Vascular Surgery

## 2018-10-26 MED FILL — CAPECITABINE 500 MG TABS: 500 | 7 days supply | Qty: 56 | Fill #0

## 2018-10-27 DIAGNOSIS — R404 Transient alteration of awareness: Secondary | ICD-10-CM | POA: Diagnosis not present

## 2018-10-28 DIAGNOSIS — 419620001 Death: Secondary | SNOMED CT | POA: Diagnosis not present

## 2018-10-28 DEATH — deceased

## 2018-11-05 IMAGING — DX DG NECK SOFT TISSUE
2 series · 2 of 2 positions shown · non-contrast
Comparison: None.

CLINICAL DATA: Possibly swallowed upper dentures. Feels like
something stuck in throat.

EXAM:
NECK SOFT TISSUES - 1+ VIEW

[neck lat]
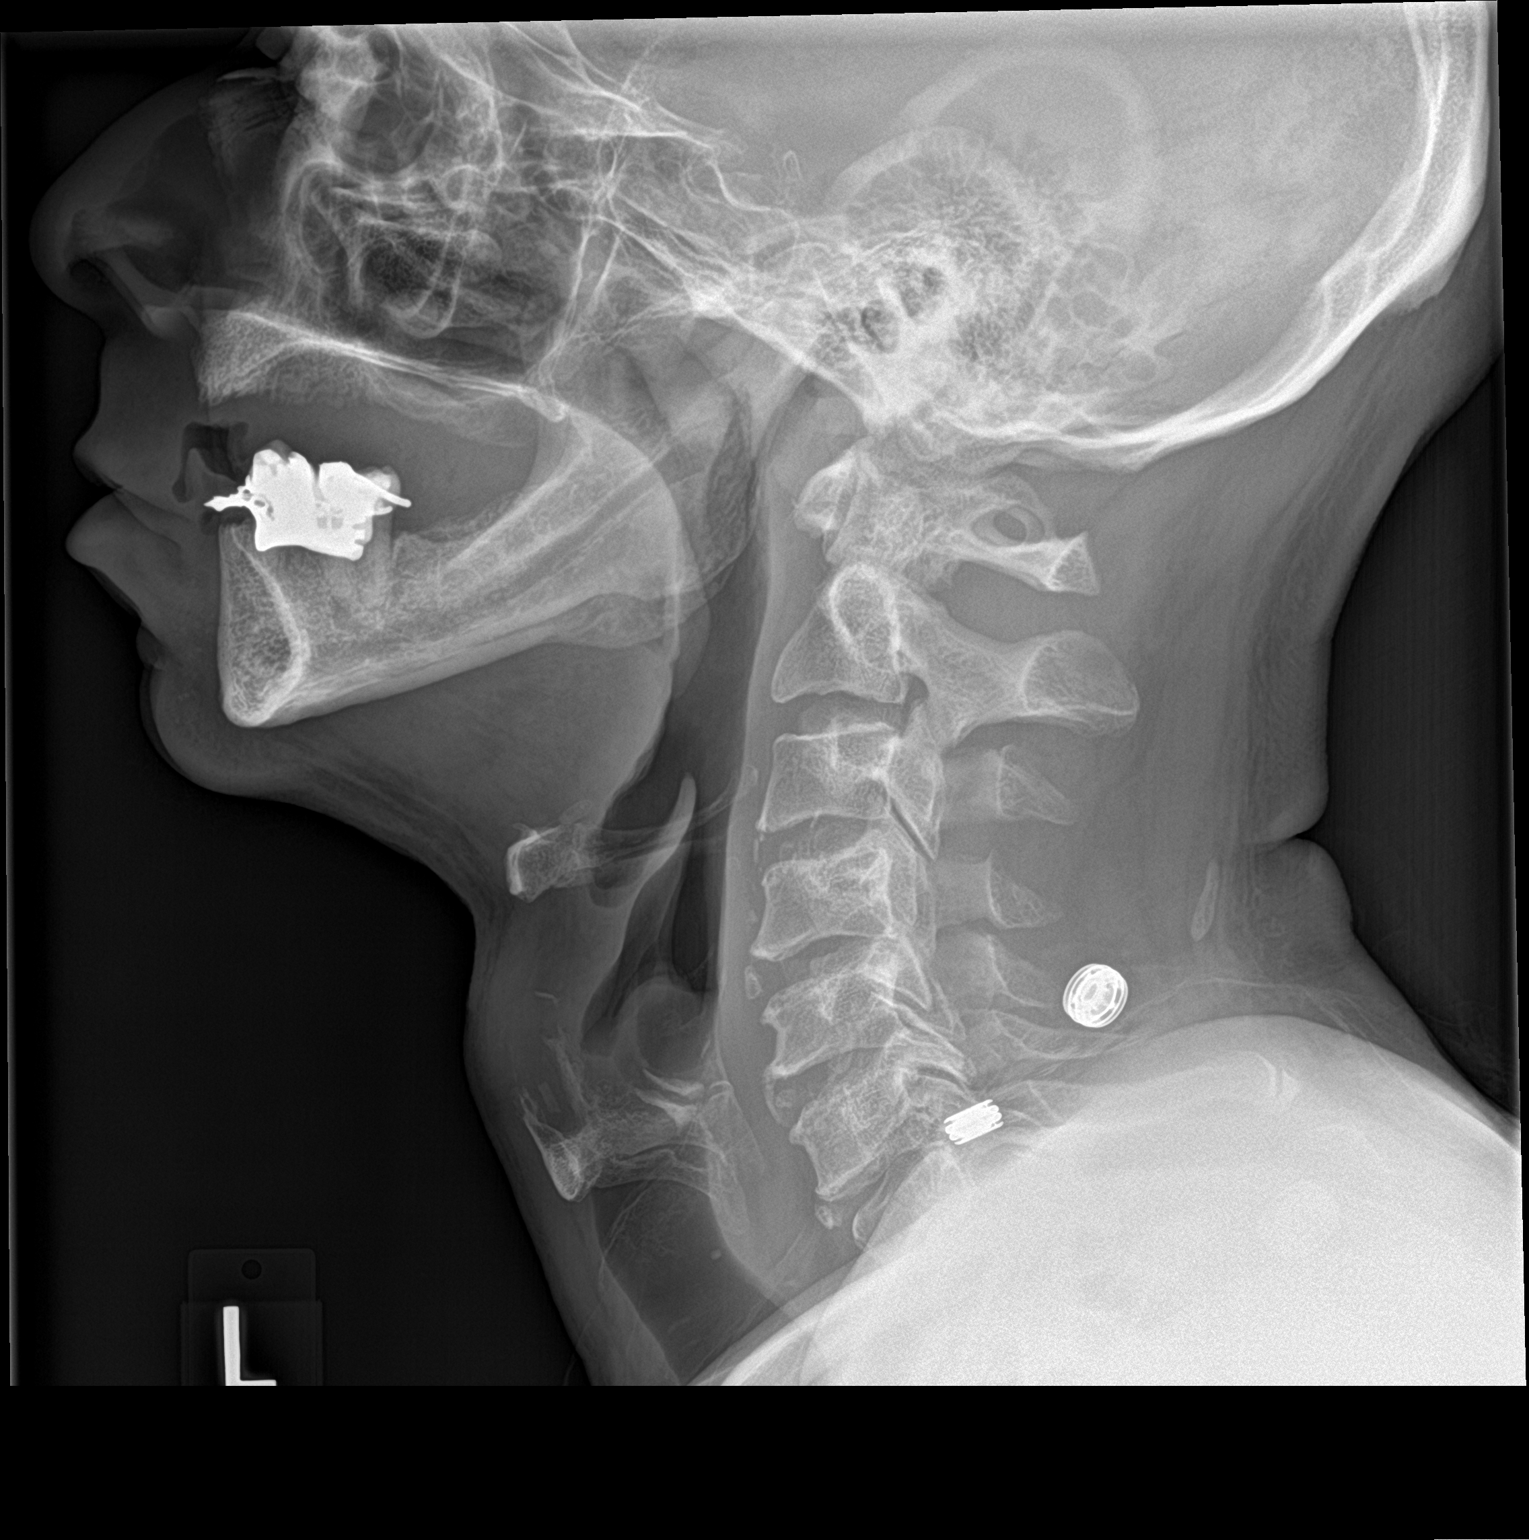

[neck ap]
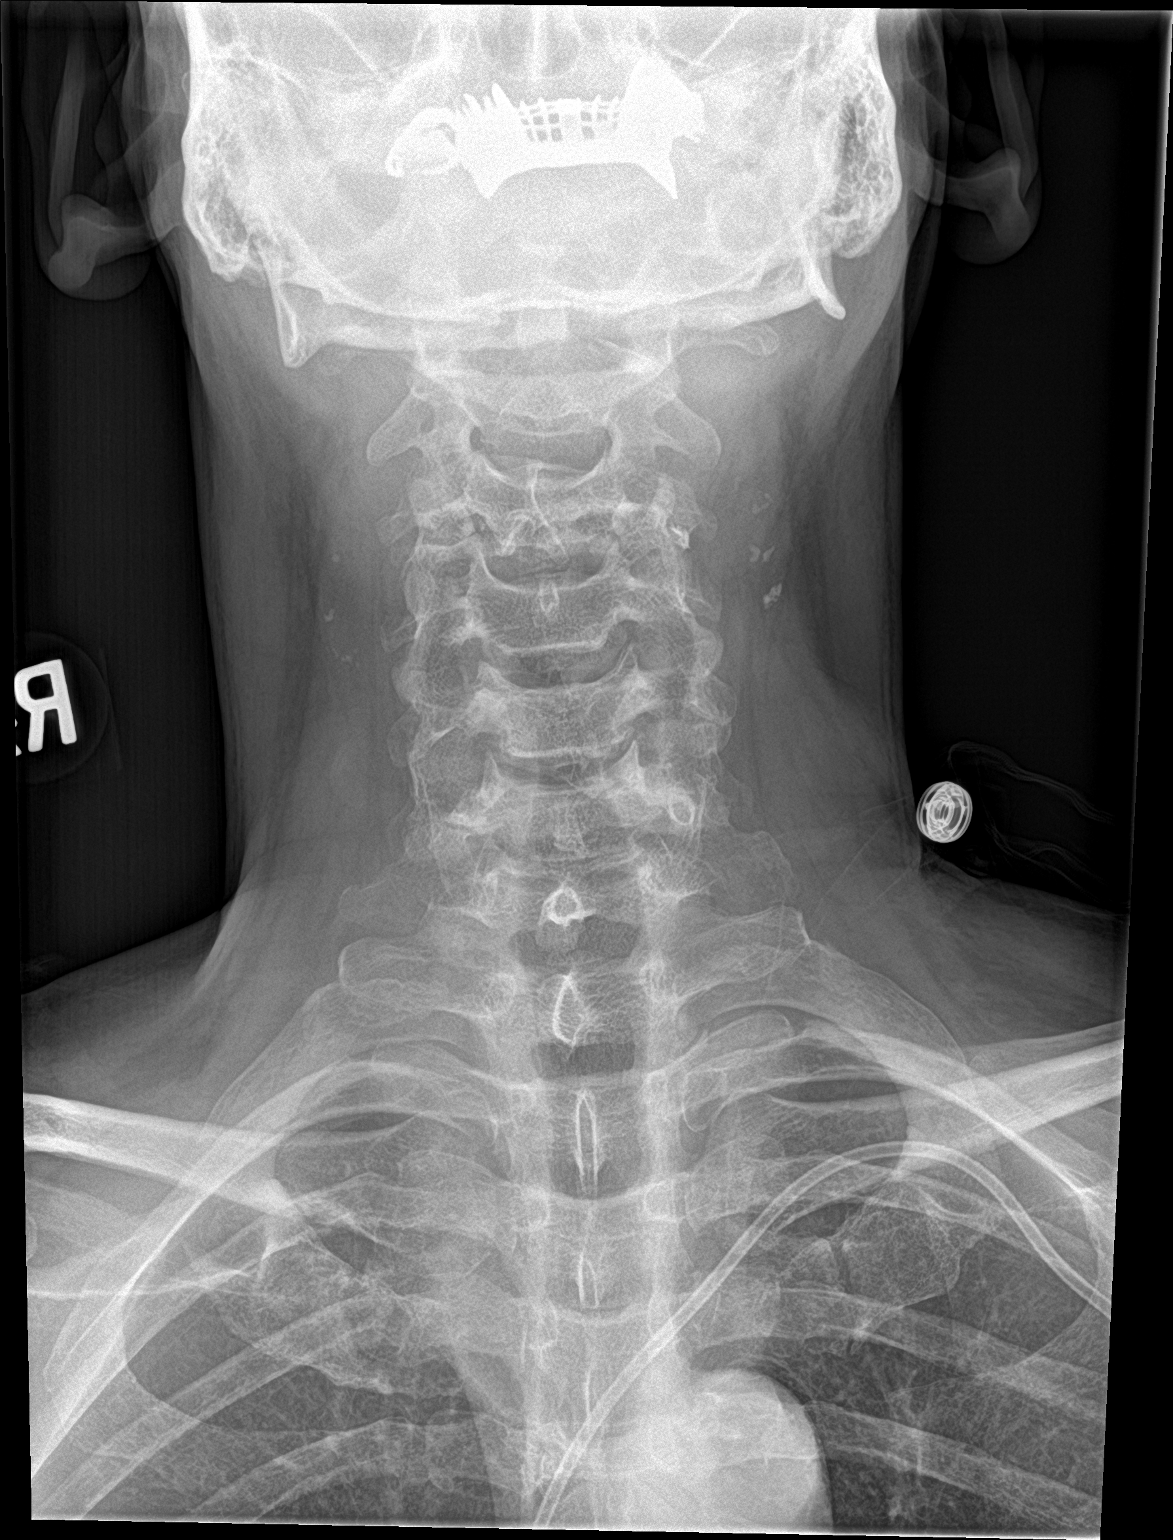

[2 of 2 positions shown; findings below may reference images not displayed]

FINDINGS: There is no evidence of retropharyngeal soft tissue swelling or
epiglottic enlargement. The cervical airway is unremarkable and no
radio-opaque foreign body identified. Degenerative changes in the
cervical spine. Scattered carotid artery calcifications.
IMPRESSION: No radiopaque foreign body.  Airway patent.

## 2018-11-09 ENCOUNTER — Ambulatory Visit (HOSPITAL_COMMUNITY): Payer: Medicare Other

## 2018-11-14 ENCOUNTER — Other Ambulatory Visit (HOSPITAL_COMMUNITY): Payer: Self-pay

## 2018-11-14 ENCOUNTER — Encounter (HOSPITAL_COMMUNITY): Payer: Self-pay | Admitting: Dietician

## 2018-11-14 ENCOUNTER — Ambulatory Visit (HOSPITAL_COMMUNITY): Payer: Self-pay

## 2018-11-14 ENCOUNTER — Ambulatory Visit (HOSPITAL_COMMUNITY): Payer: Self-pay | Admitting: Internal Medicine
